# Patient Record
Sex: Female | Born: 1950 | Race: White | Hispanic: No | Marital: Married | Smoking: Former smoker
Health system: Southern US, Community
[De-identification: ages and names within clinical notes are randomized; demographics above are authoritative.]

## PROBLEM LIST (undated history)

## (undated) DIAGNOSIS — Z8489 Family history of other specified conditions: Secondary | ICD-10-CM

## (undated) DIAGNOSIS — M810 Age-related osteoporosis without current pathological fracture: Secondary | ICD-10-CM

## (undated) DIAGNOSIS — I10 Essential (primary) hypertension: Secondary | ICD-10-CM

## (undated) DIAGNOSIS — I8392 Asymptomatic varicose veins of left lower extremity: Secondary | ICD-10-CM

## (undated) DIAGNOSIS — R0600 Dyspnea, unspecified: Secondary | ICD-10-CM

## (undated) DIAGNOSIS — B029 Zoster without complications: Secondary | ICD-10-CM

## (undated) DIAGNOSIS — I499 Cardiac arrhythmia, unspecified: Secondary | ICD-10-CM

## (undated) DIAGNOSIS — M1990 Unspecified osteoarthritis, unspecified site: Secondary | ICD-10-CM

## (undated) DIAGNOSIS — I493 Ventricular premature depolarization: Secondary | ICD-10-CM

## (undated) DIAGNOSIS — M419 Scoliosis, unspecified: Secondary | ICD-10-CM

## (undated) DIAGNOSIS — R112 Nausea with vomiting, unspecified: Secondary | ICD-10-CM

## (undated) DIAGNOSIS — M5126 Other intervertebral disc displacement, lumbar region: Secondary | ICD-10-CM

## (undated) DIAGNOSIS — F419 Anxiety disorder, unspecified: Secondary | ICD-10-CM

## (undated) DIAGNOSIS — I8390 Asymptomatic varicose veins of unspecified lower extremity: Secondary | ICD-10-CM

## (undated) DIAGNOSIS — E785 Hyperlipidemia, unspecified: Secondary | ICD-10-CM

## (undated) DIAGNOSIS — J189 Pneumonia, unspecified organism: Secondary | ICD-10-CM

## (undated) DIAGNOSIS — Z9889 Other specified postprocedural states: Secondary | ICD-10-CM

## (undated) DIAGNOSIS — R51 Headache: Secondary | ICD-10-CM

## (undated) HISTORY — PX: CARPAL TUNNEL RELEASE: SHX101

## (undated) HISTORY — DX: Asymptomatic varicose veins of unspecified lower extremity: I83.90

## (undated) HISTORY — DX: Other intervertebral disc displacement, lumbar region: M51.26

## (undated) HISTORY — DX: Scoliosis, unspecified: M41.9

## (undated) HISTORY — DX: Hyperlipidemia, unspecified: E78.5

## (undated) HISTORY — DX: Age-related osteoporosis without current pathological fracture: M81.0

## (undated) HISTORY — DX: Zoster without complications: B02.9

---

## 1983-10-30 HISTORY — PX: SALPINGOOPHORECTOMY: SHX82

## 1990-10-29 HISTORY — PX: CERVICAL DISC SURGERY: SHX588

## 2001-02-03 ENCOUNTER — Ambulatory Visit (HOSPITAL_COMMUNITY): Admission: RE | Admit: 2001-02-03 | Discharge: 2001-02-03 | Payer: Self-pay | Admitting: Obstetrics and Gynecology

## 2001-02-03 ENCOUNTER — Encounter: Payer: Self-pay | Admitting: Obstetrics and Gynecology

## 2002-01-28 ENCOUNTER — Encounter: Payer: Self-pay | Admitting: *Deleted

## 2002-01-28 ENCOUNTER — Ambulatory Visit (HOSPITAL_COMMUNITY): Admission: RE | Admit: 2002-01-28 | Discharge: 2002-01-28 | Payer: Self-pay | Admitting: *Deleted

## 2002-01-29 ENCOUNTER — Encounter: Payer: Self-pay | Admitting: *Deleted

## 2002-01-29 ENCOUNTER — Ambulatory Visit (HOSPITAL_COMMUNITY): Admission: RE | Admit: 2002-01-29 | Discharge: 2002-01-29 | Payer: Self-pay | Admitting: *Deleted

## 2002-03-09 ENCOUNTER — Encounter: Payer: Self-pay | Admitting: Obstetrics and Gynecology

## 2002-03-09 ENCOUNTER — Ambulatory Visit (HOSPITAL_COMMUNITY): Admission: RE | Admit: 2002-03-09 | Discharge: 2002-03-09 | Payer: Self-pay | Admitting: Obstetrics and Gynecology

## 2002-04-23 ENCOUNTER — Other Ambulatory Visit: Admission: RE | Admit: 2002-04-23 | Discharge: 2002-04-23 | Payer: Self-pay | Admitting: *Deleted

## 2002-07-09 ENCOUNTER — Emergency Department (HOSPITAL_COMMUNITY): Admission: EM | Admit: 2002-07-09 | Discharge: 2002-07-09 | Payer: Self-pay | Admitting: Emergency Medicine

## 2002-08-26 ENCOUNTER — Ambulatory Visit (HOSPITAL_COMMUNITY): Admission: RE | Admit: 2002-08-26 | Discharge: 2002-08-26 | Payer: Self-pay | Admitting: Otolaryngology

## 2002-08-26 ENCOUNTER — Encounter: Payer: Self-pay | Admitting: Otolaryngology

## 2003-03-11 ENCOUNTER — Ambulatory Visit (HOSPITAL_COMMUNITY): Admission: RE | Admit: 2003-03-11 | Discharge: 2003-03-11 | Payer: Self-pay | Admitting: Pediatrics

## 2003-03-11 ENCOUNTER — Encounter: Payer: Self-pay | Admitting: Pediatrics

## 2003-05-14 ENCOUNTER — Other Ambulatory Visit: Admission: RE | Admit: 2003-05-14 | Discharge: 2003-05-14 | Payer: Self-pay | Admitting: *Deleted

## 2003-12-08 ENCOUNTER — Ambulatory Visit (HOSPITAL_COMMUNITY): Admission: RE | Admit: 2003-12-08 | Discharge: 2003-12-08 | Payer: Self-pay | Admitting: Neurological Surgery

## 2004-03-17 ENCOUNTER — Ambulatory Visit (HOSPITAL_COMMUNITY): Admission: RE | Admit: 2004-03-17 | Discharge: 2004-03-17 | Payer: Self-pay | Admitting: Pediatrics

## 2004-05-24 ENCOUNTER — Ambulatory Visit (HOSPITAL_COMMUNITY): Admission: RE | Admit: 2004-05-24 | Discharge: 2004-05-24 | Payer: Self-pay | Admitting: *Deleted

## 2004-08-25 ENCOUNTER — Encounter (INDEPENDENT_AMBULATORY_CARE_PROVIDER_SITE_OTHER): Payer: Self-pay | Admitting: *Deleted

## 2004-08-25 ENCOUNTER — Ambulatory Visit (HOSPITAL_COMMUNITY): Admission: RE | Admit: 2004-08-25 | Discharge: 2004-08-25 | Payer: Self-pay | Admitting: *Deleted

## 2005-03-29 ENCOUNTER — Ambulatory Visit (HOSPITAL_COMMUNITY): Admission: RE | Admit: 2005-03-29 | Discharge: 2005-03-29 | Payer: Self-pay | Admitting: *Deleted

## 2005-07-20 ENCOUNTER — Ambulatory Visit (HOSPITAL_COMMUNITY): Admission: RE | Admit: 2005-07-20 | Discharge: 2005-07-20 | Payer: Self-pay | Admitting: Internal Medicine

## 2005-07-20 ENCOUNTER — Ambulatory Visit: Payer: Self-pay | Admitting: Internal Medicine

## 2005-10-04 ENCOUNTER — Ambulatory Visit (HOSPITAL_COMMUNITY): Admission: RE | Admit: 2005-10-04 | Discharge: 2005-10-04 | Payer: Self-pay | Admitting: Family Medicine

## 2006-04-01 ENCOUNTER — Ambulatory Visit (HOSPITAL_COMMUNITY): Admission: RE | Admit: 2006-04-01 | Discharge: 2006-04-01 | Payer: Self-pay | Admitting: *Deleted

## 2007-01-28 ENCOUNTER — Ambulatory Visit: Payer: Self-pay | Admitting: Vascular Surgery

## 2007-04-07 ENCOUNTER — Ambulatory Visit (HOSPITAL_COMMUNITY): Admission: RE | Admit: 2007-04-07 | Discharge: 2007-04-07 | Payer: Self-pay | Admitting: *Deleted

## 2007-05-06 ENCOUNTER — Ambulatory Visit: Payer: Self-pay | Admitting: Vascular Surgery

## 2007-05-19 ENCOUNTER — Ambulatory Visit: Payer: Self-pay | Admitting: Vascular Surgery

## 2007-05-27 ENCOUNTER — Ambulatory Visit: Payer: Self-pay | Admitting: Vascular Surgery

## 2007-06-16 ENCOUNTER — Ambulatory Visit: Payer: Self-pay | Admitting: Vascular Surgery

## 2007-06-23 ENCOUNTER — Ambulatory Visit: Payer: Self-pay | Admitting: Vascular Surgery

## 2007-10-31 ENCOUNTER — Emergency Department (HOSPITAL_COMMUNITY): Admission: EM | Admit: 2007-10-31 | Discharge: 2007-10-31 | Payer: Self-pay | Admitting: Emergency Medicine

## 2008-04-07 ENCOUNTER — Ambulatory Visit (HOSPITAL_COMMUNITY): Admission: RE | Admit: 2008-04-07 | Discharge: 2008-04-07 | Payer: Self-pay | Admitting: *Deleted

## 2009-04-13 ENCOUNTER — Ambulatory Visit (HOSPITAL_COMMUNITY): Admission: RE | Admit: 2009-04-13 | Discharge: 2009-04-13 | Payer: Self-pay

## 2010-04-26 ENCOUNTER — Ambulatory Visit (HOSPITAL_COMMUNITY): Admission: RE | Admit: 2010-04-26 | Discharge: 2010-04-26 | Payer: Self-pay | Admitting: Obstetrics and Gynecology

## 2010-09-28 ENCOUNTER — Ambulatory Visit (HOSPITAL_COMMUNITY): Admission: RE | Admit: 2010-09-28 | Discharge: 2010-09-28 | Payer: Self-pay | Admitting: Family Medicine

## 2010-09-28 ENCOUNTER — Encounter (HOSPITAL_COMMUNITY)
Admission: RE | Admit: 2010-09-28 | Discharge: 2010-10-28 | Payer: Self-pay | Source: Home / Self Care | Attending: Family Medicine | Admitting: Family Medicine

## 2010-10-04 ENCOUNTER — Ambulatory Visit (HOSPITAL_COMMUNITY)
Admission: RE | Admit: 2010-10-04 | Discharge: 2010-10-05 | Payer: Self-pay | Source: Home / Self Care | Attending: Family Medicine | Admitting: Family Medicine

## 2010-10-11 ENCOUNTER — Encounter (HOSPITAL_BASED_OUTPATIENT_CLINIC_OR_DEPARTMENT_OTHER)
Admission: RE | Admit: 2010-10-11 | Discharge: 2010-11-28 | Payer: Self-pay | Source: Home / Self Care | Attending: General Surgery | Admitting: General Surgery

## 2010-10-11 ENCOUNTER — Encounter: Payer: Self-pay | Admitting: Cardiology

## 2010-11-19 ENCOUNTER — Encounter: Payer: Self-pay | Admitting: Obstetrics and Gynecology

## 2010-11-30 NOTE — Procedures (Signed)
Summary: Holter and Event  Holter and Event   Imported By: Faythe Ghee 10/11/2010 15:43:02  _____________________________________________________________________  External Attachment:    Type:   Image     Comment:   External Document

## 2010-12-15 ENCOUNTER — Encounter (HOSPITAL_BASED_OUTPATIENT_CLINIC_OR_DEPARTMENT_OTHER): Payer: 59 | Attending: General Surgery

## 2010-12-15 DIAGNOSIS — T8189XA Other complications of procedures, not elsewhere classified, initial encounter: Secondary | ICD-10-CM | POA: Insufficient documentation

## 2010-12-15 DIAGNOSIS — Y838 Other surgical procedures as the cause of abnormal reaction of the patient, or of later complication, without mention of misadventure at the time of the procedure: Secondary | ICD-10-CM | POA: Insufficient documentation

## 2010-12-15 DIAGNOSIS — I872 Venous insufficiency (chronic) (peripheral): Secondary | ICD-10-CM | POA: Insufficient documentation

## 2011-01-12 ENCOUNTER — Encounter (HOSPITAL_BASED_OUTPATIENT_CLINIC_OR_DEPARTMENT_OTHER): Payer: 59 | Attending: General Surgery

## 2011-01-12 DIAGNOSIS — Y838 Other surgical procedures as the cause of abnormal reaction of the patient, or of later complication, without mention of misadventure at the time of the procedure: Secondary | ICD-10-CM | POA: Insufficient documentation

## 2011-01-12 DIAGNOSIS — T8189XA Other complications of procedures, not elsewhere classified, initial encounter: Secondary | ICD-10-CM | POA: Insufficient documentation

## 2011-01-12 DIAGNOSIS — I872 Venous insufficiency (chronic) (peripheral): Secondary | ICD-10-CM | POA: Insufficient documentation

## 2011-03-13 NOTE — Assessment & Plan Note (Signed)
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  She has been wearing elastic compression stockings  bilaterally and trying analgesics such as ibuprofen and elevation of the  legs as much as her job will permit and has had no improvement in her  symptoms.  She works as a Animal nutritionist at [employer] and sits with her legs  in the dependent position during the day and this is definitely  affecting her job and her daily living.  She describes severe throbbing,  aching, burning and itching in both lower extremities, with the right  being worse than the left.  This is in the distal thigh and calf areas.  She has had no bleeding or ulcerations.   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  We will proceed with pre-certification and  schedule this procedure in the near future.   Quita Skye Hart [place], M.D.  Electronically Signed   JDL/MEDQ  D:  05/06/2007  T:  05/07/2007  Job:  [id number]

## 2011-03-13 NOTE — Assessment & Plan Note (Signed)
OFFICE VISIT   RYLEA, SELWAY  DOB:  [birth year]                                       06/23/2007  [id number]#:[id number]   Ms. O'Dell returns status post laser ablation of her left greater  saphenous vein with multiple stab phlebectomies (greater than 20) in her  left leg done on August 18 for painful varicosities.  She has had an  excellent early result and some mild tenderness along the course of the  greater saphenous vein in the thigh but no significant bruising.  All of  the stab phlebectomy wounds are healing nicely with no evidence of  infection.  There is no distal edema.  I performed a venous duplex exam  today and the deep system is widely patent with no evidence of deep  venous thrombosis.  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  She was reassured regarding these findings.  She will continue to wear  elastic compression stockings for the next two weeks as well as take her  Ibuprofen as prescribed and return to see me in three moths for final  follow-up.   Quita Skye Hart [place], M.D.  Electronically Signed   JDL/MEDQ  D:  06/23/2007  T:  06/24/2007  Job:  [id number]

## 2011-03-13 NOTE — Assessment & Plan Note (Signed)
OFFICE VISIT   Jennifer Coleman, Jennifer Coleman  DOB:  03/21/1951                                       05/27/2007  KVQQV#:[id number]   [name] is eight days post laser ablation of her right greater  saphenous vein with multiple stab phlebectomy  to the right leg.  She  has had moderate amount of discomfort in the right thigh where the  saphenous vein was ablated which has been improving on a daily basis.  She has had some aching discomfort over the stab phlebectomy in the  pretibial region, but, otherwise, no complaints.  She is wearing her  last compression stocking and has taken ibuprofen.  She has had no  distal edema.  Venous Duplex exam was performed by me which reveals  occlusion of the greater saphenous vein throughout the thigh from the  saphenofemoral junction to the knee and widely patent deep venous system  with no evidence of any deep venous thrombosis or flow abnormalities.  She is reassured regarding her findings.  She will return for laser  ablation of the left greater saphenous vein with stab phlebectomy on the  18th of August.   Quita Skye. Hart [place], M.D.  Electronically Signed   JDL/MEDQ  D:  05/27/2007  T:  05/28/2007  Job:  [id number]

## 2011-03-16 NOTE — Op Note (Signed)
NAMELINDSAY, STRAKA                  ACCOUNT NO.:  [id number]   MEDICAL RECORD NO.:  [id number]          PATIENT TYPE:  AMB   LOCATION:  SDC                           FACILITY:  [facility]   PHYSICIAN:  Spencer B. Earlene Plater, M.D.  DATE OF BIRTH:  03/06/1951   DATE OF PROCEDURE:  08/25/2004  DATE OF DISCHARGE:                                 OPERATIVE REPORT   PREOPERATIVE DIAGNOSES:  Endometrial mass.   POSTOPERATIVE DIAGNOSES:  Endometrial mass.   PROCEDURE:  Hysteroscopy with removal of intrauterine mass and endometrial  curettage.   SURGEON:  Chester Holstein. Earlene Plater, M.D.   ANESTHESIA:  LMA and 20 mL 1% Nesacaine paracervical block.   FINDINGS:  Calcified 1.5 cm endometrial mass at the fundus.   FLUID DEFICIT:  50 mL of sorbitol.   SPECIMENS:  Intrauterine mass.   COMPLICATIONS:  None.   INDICATIONS FOR PROCEDURE:  Patient with a history of lower abdominal pain  at a recent annual exam. Pelvic exam was normal, however, I ordered an  ultrasound to rule out ovarian pathology. The ovaries appeared normal.  However, an incidental note was made of a calcified 1.5 cm endometrial mass  and uterine fundus. Repeat ultrasound a month later showed persistence. To  determine the etiology, the patient presents for hysteroscopy. Operative  risks discussed including infection, bleeding, uterine perforation, damage  to surrounding organs and fluid overload.   The patient taken to the operating room and LMA anesthesia obtained. She was  prepped and draped in standard fashion and bladder emptied with red rubber  catheter. Exam  under anesthesia showed an anteverted normal uterus, no  adnexal masses.   Speculum inserted, paracervical block placed, anterior lip of the cervix  grasped with a tenaculum. The cervix dilated to a #21 without difficulty and  the diagnostic hysteroscope inserted after being flushed with sorbitol. With  good uterine distention, the above findings were noted. I attempted to  remove  the mass with Randall stone forceps but it was very adherent to the  fundus.   I did also try to gently curette the area with no success.   The cervix was then dilated to a #31 and the resectoscope inserted. A double  loop resection tool was used to attempt to resect the mass.  However, I  could not get a good angle on the base of the mass to safely resect it. I  therefore switched over to the working hysteroscope and inserted a grasping  biopsy forceps which successfully removed the mass. It was friable but  easily removed and submitted to pathology.  The cavity was reinspected, no  other abnormalities noted.  Therefore the procedure was terminated.   The patient tolerated the procedure well, there were no complications.  She  was taken to the recovery room awake, alert in stable condition.     Wesl   WBD/MEDQ  D:  08/25/2004  T:  08/25/2004  Job:  [id number]

## 2011-03-16 NOTE — Op Note (Signed)
[name], [name]                  ACCOUNT NO.:  [id number]   MEDICAL RECORD NO.:  [id number]          PATIENT TYPE:  AMB   LOCATION:  DAY                           FACILITY:  [facility]   PHYSICIAN:  Lionel December, M.D.    DATE OF BIRTH:  26-Jul-1951   DATE OF PROCEDURE:  07/20/2005  DATE OF DISCHARGE:                                 OPERATIVE REPORT   PROCEDURE:  Colonoscopy.   INDICATION:  Jennifer Coleman is a 60 year old Caucasian female who is here for  screening colonoscopy. Procedure risks were reviewed with the patient, and  informed consent was obtained. Her family history is negative for colorectal  carcinoma.   PREMEDICATION:  Demerol 50 mg IV, Versed 6 mg IV in divided dose.   FINDINGS:  Procedure performed in endoscopy suite. The patient's vital signs  and O2 saturation were monitored during procedure and remained stable. The  patient was placed in left lateral position and rectal examination  performed. No abnormality noted on external or digital exam. Olympus  videoscope was placed in rectum and advanced under vision into sigmoid colon  and beyond. Preparation was excellent. Few scattered diverticula were noted  at sigmoid colon, few tiny ones at transverse colon. Scope was passed to  cecum which was identified by ileocecal valve and appendiceal orifice.  Pictures taken for the record. As the scope was withdrawn, colonic mucosa  was once again carefully examined. There were no polyps or masses noted.  Rectal mucosa was normal. Scope was retroflexed to examine anorectal  junction, and small hemorrhoids were noted below the dentate line. Endoscope  was straightened and withdrawn. The patient tolerated the procedure well.   FINAL DIAGNOSIS:  1.  Sigmoid colon diverticulosis with a few more diverticula in transverse      colon. Most of the diverticula are small.  2.  Small external hemorrhoids.   RECOMMENDATIONS:  1.  High-fiber diet.  2.  Yearly Hemoccults.  3.  She will not need  another screening for 10 years.      Lionel December, M.D.  Electronically Signed     NR/MEDQ  D:  07/20/2005  T:  07/20/2005  Job:  [id number]   cc:   Wyvonnia Lora  Fax: [phone]

## 2011-04-23 ENCOUNTER — Other Ambulatory Visit (HOSPITAL_COMMUNITY): Payer: Self-pay | Admitting: Obstetrics and Gynecology

## 2011-04-23 DIAGNOSIS — Z139 Encounter for screening, unspecified: Secondary | ICD-10-CM

## 2011-04-30 ENCOUNTER — Ambulatory Visit (HOSPITAL_COMMUNITY)
Admission: RE | Admit: 2011-04-30 | Discharge: 2011-04-30 | Disposition: A | Discharge status: Home / Self Care | Payer: 59 | Source: Ambulatory Visit | Attending: Obstetrics and Gynecology | Admitting: Obstetrics and Gynecology

## 2011-04-30 DIAGNOSIS — Z139 Encounter for screening, unspecified: Secondary | ICD-10-CM

## 2011-04-30 DIAGNOSIS — Z1231 Encounter for screening mammogram for malignant neoplasm of breast: Secondary | ICD-10-CM | POA: Insufficient documentation

## 2011-09-17 NOTE — H&P (Signed)
  NAME:  CHARLYE, SPARE NO.:  [id number]  MEDICAL RECORD NO.:  [id number]          PATIENT TYPE:  REC  LOCATION:  FOOT                         FACILITY:  [facility]  PHYSICIAN:  Ardath Sax, M.D.     DATE OF BIRTH:  Aug 27, 1951  DATE OF ADMISSION: DATE OF DISCHARGE:                             HISTORY & PHYSICAL   She is a new patient who comes to see [place] because of a lingering wound on the anterior aspect of her left leg.  Following biopsy done by a dermatologist which proved to be benign.  Apparently, this was a lot larger at one time up to 2.5 cm, now it is only about a centimeter in diameter.  It cultured out of enterococcus and she is on Biaxin now. Otherwise, she is very healthy.  She is 55, never had any surgery, takes no medicine at all.  She is a healthy active person with no heart disease, lung disease, hypertension, kidney disease, and no previous surgery.  She says that when the doctor biopsied this, he used silver nitrate to stop bleeding.  It is now actually very clean with good granulation tissue.  I think it will lend itself very nicely to Puracol, so we put dressing on and she will use this every day and come back in a week and we will check her.  I think this should heal up in 6-80 weeks. Her blood supply is very good.  She has got good pulses and no evidence of any significant venous disease, although she is wearing bilateral support hose.  She will return in a week.     Ardath Sax, M.D.     PP/MEDQ  D:  10/11/2010  T:  10/12/2010  Job:  [id number]  Electronically Signed by Ardath Sax  on 09/17/2011 01:47:36 PM

## 2011-10-18 ENCOUNTER — Other Ambulatory Visit (HOSPITAL_COMMUNITY): Payer: Self-pay | Admitting: Family Medicine

## 2011-10-18 ENCOUNTER — Ambulatory Visit (HOSPITAL_COMMUNITY): Payer: 59

## 2011-11-14 ENCOUNTER — Other Ambulatory Visit: Payer: Self-pay | Admitting: Physician Assistant

## 2011-11-14 DIAGNOSIS — C4492 Squamous cell carcinoma of skin, unspecified: Secondary | ICD-10-CM

## 2011-11-14 HISTORY — DX: Squamous cell carcinoma of skin, unspecified: C44.92

## 2011-11-28 ENCOUNTER — Other Ambulatory Visit (HOSPITAL_COMMUNITY): Payer: Self-pay | Admitting: Orthopaedic Surgery

## 2011-11-28 DIAGNOSIS — M25561 Pain in right knee: Secondary | ICD-10-CM

## 2011-11-30 ENCOUNTER — Ambulatory Visit (HOSPITAL_COMMUNITY)
Admission: RE | Admit: 2011-11-30 | Discharge: 2011-11-30 | Disposition: A | Discharge status: Home / Self Care | Payer: 59 | Source: Ambulatory Visit | Attending: Orthopaedic Surgery | Admitting: Orthopaedic Surgery

## 2011-11-30 DIAGNOSIS — M25561 Pain in right knee: Secondary | ICD-10-CM

## 2011-11-30 DIAGNOSIS — Y9289 Other specified places as the place of occurrence of the external cause: Secondary | ICD-10-CM | POA: Insufficient documentation

## 2011-11-30 DIAGNOSIS — M25569 Pain in unspecified knee: Secondary | ICD-10-CM | POA: Insufficient documentation

## 2011-11-30 DIAGNOSIS — S99929A Unspecified injury of unspecified foot, initial encounter: Secondary | ICD-10-CM | POA: Insufficient documentation

## 2011-11-30 DIAGNOSIS — R937 Abnormal findings on diagnostic imaging of other parts of musculoskeletal system: Secondary | ICD-10-CM | POA: Insufficient documentation

## 2011-11-30 DIAGNOSIS — S99919A Unspecified injury of unspecified ankle, initial encounter: Secondary | ICD-10-CM | POA: Insufficient documentation

## 2011-11-30 DIAGNOSIS — S8990XA Unspecified injury of unspecified lower leg, initial encounter: Secondary | ICD-10-CM | POA: Insufficient documentation

## 2011-11-30 DIAGNOSIS — X500XXA Overexertion from strenuous movement or load, initial encounter: Secondary | ICD-10-CM | POA: Insufficient documentation

## 2012-05-19 ENCOUNTER — Ambulatory Visit (HOSPITAL_COMMUNITY)
Admission: RE | Admit: 2012-05-19 | Discharge: 2012-05-19 | Disposition: A | Discharge status: Home / Self Care | Payer: 59 | Source: Ambulatory Visit | Attending: Family Medicine | Admitting: Family Medicine

## 2012-05-19 ENCOUNTER — Other Ambulatory Visit: Payer: Self-pay | Admitting: Family Medicine

## 2012-05-19 DIAGNOSIS — M79609 Pain in unspecified limb: Secondary | ICD-10-CM | POA: Insufficient documentation

## 2012-05-19 DIAGNOSIS — M7989 Other specified soft tissue disorders: Secondary | ICD-10-CM | POA: Insufficient documentation

## 2012-05-19 DIAGNOSIS — R609 Edema, unspecified: Secondary | ICD-10-CM

## 2012-05-20 ENCOUNTER — Other Ambulatory Visit: Payer: Self-pay | Admitting: Family Medicine

## 2012-05-20 ENCOUNTER — Other Ambulatory Visit (HOSPITAL_COMMUNITY): Payer: Self-pay | Admitting: Obstetrics and Gynecology

## 2012-05-20 DIAGNOSIS — R7989 Other specified abnormal findings of blood chemistry: Secondary | ICD-10-CM

## 2012-05-20 DIAGNOSIS — Z139 Encounter for screening, unspecified: Secondary | ICD-10-CM

## 2012-05-27 ENCOUNTER — Other Ambulatory Visit (HOSPITAL_COMMUNITY): Payer: 59

## 2012-05-27 ENCOUNTER — Ambulatory Visit (HOSPITAL_COMMUNITY): Payer: 59

## 2012-05-27 ENCOUNTER — Ambulatory Visit (HOSPITAL_COMMUNITY)
Admission: RE | Admit: 2012-05-27 | Discharge: 2012-05-27 | Disposition: A | Discharge status: Home / Self Care | Payer: 59 | Source: Ambulatory Visit | Attending: Obstetrics and Gynecology | Admitting: Obstetrics and Gynecology

## 2012-05-27 DIAGNOSIS — Z139 Encounter for screening, unspecified: Secondary | ICD-10-CM

## 2012-05-27 DIAGNOSIS — Z1231 Encounter for screening mammogram for malignant neoplasm of breast: Secondary | ICD-10-CM | POA: Insufficient documentation

## 2012-05-28 ENCOUNTER — Other Ambulatory Visit (HOSPITAL_COMMUNITY): Payer: 59

## 2012-07-07 ENCOUNTER — Telehealth: Payer: Self-pay | Admitting: Vascular Surgery

## 2012-07-07 NOTE — Telephone Encounter (Signed)
Patient called and left a message requesting to schedule an appointment for a wound on her leg. She is a former [facility]V pt of [facility]. We haven't seen her in several years and would require a referral since we would be seeing her for a different problem than VV. I left a voice mail message on her home ph# at 2:43pm today in regards to the above inquiry. awt

## 2012-08-06 ENCOUNTER — Other Ambulatory Visit (HOSPITAL_COMMUNITY): Payer: Self-pay | Admitting: Orthopaedic Surgery

## 2012-08-06 DIAGNOSIS — M25569 Pain in unspecified knee: Secondary | ICD-10-CM

## 2012-08-07 ENCOUNTER — Ambulatory Visit (HOSPITAL_COMMUNITY)
Admission: RE | Admit: 2012-08-07 | Discharge: 2012-08-07 | Disposition: A | Discharge status: Home / Self Care | Payer: 59 | Source: Ambulatory Visit | Attending: Orthopaedic Surgery | Admitting: Orthopaedic Surgery

## 2012-08-07 ENCOUNTER — Ambulatory Visit (HOSPITAL_COMMUNITY): Payer: 59

## 2012-08-07 DIAGNOSIS — M76899 Other specified enthesopathies of unspecified lower limb, excluding foot: Secondary | ICD-10-CM | POA: Insufficient documentation

## 2012-08-07 DIAGNOSIS — M25569 Pain in unspecified knee: Secondary | ICD-10-CM | POA: Insufficient documentation

## 2012-08-07 DIAGNOSIS — M25469 Effusion, unspecified knee: Secondary | ICD-10-CM | POA: Insufficient documentation

## 2012-08-07 DIAGNOSIS — X500XXA Overexertion from strenuous movement or load, initial encounter: Secondary | ICD-10-CM | POA: Insufficient documentation

## 2012-08-07 DIAGNOSIS — IMO0002 Reserved for concepts with insufficient information to code with codable children: Secondary | ICD-10-CM | POA: Insufficient documentation

## 2012-08-08 ENCOUNTER — Ambulatory Visit (HOSPITAL_COMMUNITY): Payer: 59

## 2012-08-26 ENCOUNTER — Other Ambulatory Visit (HOSPITAL_COMMUNITY): Payer: Self-pay | Admitting: Obstetrics and Gynecology

## 2012-08-26 DIAGNOSIS — Z79890 Hormone replacement therapy (postmenopausal): Secondary | ICD-10-CM

## 2012-08-26 DIAGNOSIS — Z780 Asymptomatic menopausal state: Secondary | ICD-10-CM

## 2012-09-11 ENCOUNTER — Encounter (HOSPITAL_COMMUNITY): Payer: Self-pay

## 2012-09-12 NOTE — Pre-Procedure Instructions (Signed)
20 BRYONNA SUNDBY  09/12/2012   Your procedure is scheduled on:  Tuesday November 19  Report to [facility] at 5:30 AM.  Call this number if you have problems the morning of surgery: [phone]   Remember:   Do not eat or drink:After Midnight.    Take these medicines the morning of surgery with A SIP OF WATER: Cetirizine. May use Flonase nasal spray.    Do not wear jewelry, make-up or nail polish.  Do not wear lotions, powders, or perfumes. You may wear deodorant.  Do not shave 48 hours prior to surgery. Men may shave face and neck.  Do not bring valuables to the hospital.  Contacts, dentures or bridgework may not be worn into surgery.  Leave suitcase in the car. After surgery it may be brought to your room.  For patients admitted to the hospital, checkout time is 11:00 AM the day of discharge.   Patients discharged the day of surgery will not be allowed to drive home.    Special Instructions: Shower using CHG 2 nights before surgery and the night before surgery.  If you shower the day of surgery use CHG.  Use special wash - you have one bottle of CHG for all showers.  You should use approximately 1/3 of the bottle for each shower.   Please read over the following fact sheets that you were given: Pain Booklet, Coughing and Deep Breathing and Surgical Site Infection Prevention

## 2012-09-15 ENCOUNTER — Encounter (HOSPITAL_COMMUNITY)
Admission: RE | Admit: 2012-09-15 | Discharge: 2012-09-15 | Disposition: A | Discharge status: Home / Self Care | Payer: 59 | Source: Ambulatory Visit | Attending: Orthopaedic Surgery | Admitting: Orthopaedic Surgery

## 2012-09-15 ENCOUNTER — Encounter (HOSPITAL_COMMUNITY): Payer: Self-pay

## 2012-09-15 HISTORY — DX: Other specified postprocedural states: R11.2

## 2012-09-15 HISTORY — DX: Family history of other specified conditions: Z84.89

## 2012-09-15 HISTORY — DX: Cardiac arrhythmia, unspecified: I49.9

## 2012-09-15 HISTORY — DX: Other specified postprocedural states: Z98.890

## 2012-09-15 LAB — CBC
Hemoglobin: 14.1 g/dL (ref 12.0–15.0)
MCH: 32.7 pg (ref 26.0–34.0)
MCV: 97 fL (ref 78.0–100.0)
Platelets: 265 10*3/uL (ref 150–400)
RBC: 4.31 MIL/uL (ref 3.87–5.11)
RDW: 12.5 % (ref 11.5–15.5)

## 2012-09-15 NOTE — Pre-Procedure Instructions (Signed)
20 Jennifer Coleman  09/15/2012   Your procedure is scheduled on: Wednesday  September 24, 2012  Report to [facility] at 0600 AM.  Call this number if you have problems the morning of surgery: [phone]   Remember:   Do not eat food  Do not drink liquids:After Midnight.    Take these medicines the morning of surgery with A SIP OF WATER: Flonase  Activella   Do not wear jewelry, make-up or nail polish.  Do not wear lotions, powders, or perfumes. You may wear deodorant.  Do not shave 48 hours prior to surgery. Men may shave face and neck.  Do not bring valuables to the hospital.  Contacts, dentures or bridgework may not be worn into surgery.  Leave suitcase in the car. After surgery it may be brought to your room.  For patients admitted to the hospital, checkout time is 11:00 AM the day of discharge.   Patients discharged the day of surgery will not be allowed to drive home.  Name and phone number of your driver:   Special Instructions: Shower using CHG 2 nights before surgery and the night before surgery.  If you shower the day of surgery use CHG.  Use special wash - you have one bottle of CHG for all showers.  You should use approximately 1/3 of the bottle for each shower. N/A   Please read over the following fact sheets that you were given: Pain Booklet, Coughing and Deep Breathing, Lab Information, MRSA Information and Surgical Site Infection Prevention

## 2012-09-15 NOTE — Progress Notes (Signed)
Called office of Dr Cleophas Dunker [phone] requested orders ... At 1000 today .... Orders placed by pa at 1104am .

## 2012-09-16 ENCOUNTER — Ambulatory Visit (HOSPITAL_BASED_OUTPATIENT_CLINIC_OR_DEPARTMENT_OTHER): Admit: 2012-09-16 | Payer: 59 | Admitting: Orthopaedic Surgery

## 2012-09-16 ENCOUNTER — Ambulatory Visit (HOSPITAL_COMMUNITY)
Admission: RE | Admit: 2012-09-16 | Discharge: 2012-09-16 | Disposition: A | Discharge status: Home / Self Care | Payer: 59 | Source: Ambulatory Visit | Attending: Orthopaedic Surgery | Admitting: Orthopaedic Surgery

## 2012-09-16 ENCOUNTER — Encounter (HOSPITAL_BASED_OUTPATIENT_CLINIC_OR_DEPARTMENT_OTHER): Payer: Self-pay

## 2012-09-16 ENCOUNTER — Encounter (HOSPITAL_COMMUNITY)
Admission: RE | Disposition: A | Discharge status: Home / Self Care | Payer: Self-pay | Source: Ambulatory Visit | Attending: Orthopaedic Surgery

## 2012-09-16 ENCOUNTER — Ambulatory Visit (HOSPITAL_COMMUNITY): Payer: 59 | Admitting: Anesthesiology

## 2012-09-16 ENCOUNTER — Encounter (HOSPITAL_COMMUNITY): Payer: Self-pay | Admitting: Anesthesiology

## 2012-09-16 ENCOUNTER — Encounter (HOSPITAL_COMMUNITY): Payer: Self-pay | Admitting: *Deleted

## 2012-09-16 DIAGNOSIS — M6751 Plica syndrome, right knee: Secondary | ICD-10-CM | POA: Diagnosis present

## 2012-09-16 DIAGNOSIS — S83289A Other tear of lateral meniscus, current injury, unspecified knee, initial encounter: Secondary | ICD-10-CM | POA: Diagnosis present

## 2012-09-16 DIAGNOSIS — M1710 Unilateral primary osteoarthritis, unspecified knee: Secondary | ICD-10-CM | POA: Insufficient documentation

## 2012-09-16 DIAGNOSIS — K219 Gastro-esophageal reflux disease without esophagitis: Secondary | ICD-10-CM | POA: Insufficient documentation

## 2012-09-16 DIAGNOSIS — M2241 Chondromalacia patellae, right knee: Secondary | ICD-10-CM | POA: Diagnosis present

## 2012-09-16 DIAGNOSIS — X500XXA Overexertion from strenuous movement or load, initial encounter: Secondary | ICD-10-CM | POA: Insufficient documentation

## 2012-09-16 DIAGNOSIS — M94269 Chondromalacia, unspecified knee: Secondary | ICD-10-CM | POA: Diagnosis present

## 2012-09-16 DIAGNOSIS — M2240 Chondromalacia patellae, unspecified knee: Secondary | ICD-10-CM | POA: Insufficient documentation

## 2012-09-16 HISTORY — PX: KNEE ARTHROSCOPY: SHX127

## 2012-09-16 SURGERY — ARTHROSCOPY, KNEE
Anesthesia: Choice | Site: Knee | Laterality: Right

## 2012-09-16 SURGERY — ARTHROSCOPY, KNEE
Anesthesia: General | Site: Knee | Laterality: Right | Wound class: Clean

## 2012-09-16 MED ORDER — OXYCODONE HCL 5 MG/5ML PO SOLN: 5.0000 mg | Freq: Once | ORAL | Status: DC | PRN

## 2012-09-16 MED ORDER — ONDANSETRON HCL 4 MG/2ML IJ SOLN: 4.0000 mg | Freq: Four times a day (QID) | INTRAMUSCULAR | Status: DC | PRN

## 2012-09-16 MED ORDER — ARTIFICIAL TEARS OP OINT
TOPICAL_OINTMENT | OPHTHALMIC | Status: DC | PRN
  Administered 2012-09-16: 1 via OPHTHALMIC

## 2012-09-16 MED ORDER — HYDROMORPHONE HCL PF 1 MG/ML IJ SOLN
0.2500 mg | INTRAMUSCULAR | Status: DC | PRN
  Administered 2012-09-16 (×2): 0.5 mg via INTRAVENOUS

## 2012-09-16 MED ORDER — HYDROMORPHONE HCL PF 1 MG/ML IJ SOLN
INTRAMUSCULAR | Status: AC
  Filled 2012-09-16: qty 1

## 2012-09-16 MED ORDER — BUPIVACAINE-EPINEPHRINE PF 0.25-1:200000 % IJ SOLN
INTRAMUSCULAR | Status: AC
  Filled 2012-09-16: qty 30

## 2012-09-16 MED ORDER — ONDANSETRON HCL 4 MG/2ML IJ SOLN
INTRAMUSCULAR | Status: DC | PRN
  Administered 2012-09-16: 4 mg via INTRAVENOUS

## 2012-09-16 MED ORDER — OXYCODONE HCL 5 MG PO TABS: 5.0000 mg | ORAL_TABLET | Freq: Once | ORAL | Status: DC | PRN

## 2012-09-16 MED ORDER — ASPIRIN EC 81 MG PO TBEC: 81.0000 mg | DELAYED_RELEASE_TABLET | Freq: Every day | ORAL | Status: DC

## 2012-09-16 MED ORDER — MIDAZOLAM HCL 5 MG/5ML IJ SOLN
INTRAMUSCULAR | Status: DC | PRN
  Administered 2012-09-16 (×2): 1 mg via INTRAVENOUS

## 2012-09-16 MED ORDER — CHLORHEXIDINE GLUCONATE 4 % EX LIQD
60.0000 mL | Freq: Once | CUTANEOUS | Status: DC
Start: 2012-09-16 — End: 2012-09-16

## 2012-09-16 MED ORDER — OXYCODONE-ACETAMINOPHEN 5-325 MG PO TABS: ORAL_TABLET | ORAL | Status: DC

## 2012-09-16 MED ORDER — LACTATED RINGERS IV SOLN
INTRAVENOUS | Status: DC | PRN
  Administered 2012-09-16: 07:00:00 via INTRAVENOUS

## 2012-09-16 MED ORDER — SODIUM CHLORIDE 0.9 % IR SOLN
Status: DC | PRN
  Administered 2012-09-16: 6000 mL

## 2012-09-16 MED ORDER — BUPIVACAINE-EPINEPHRINE 0.25% -1:200000 IJ SOLN
INTRAMUSCULAR | Status: DC | PRN
  Administered 2012-09-16: 10 mL

## 2012-09-16 MED ORDER — LIDOCAINE HCL (CARDIAC) 20 MG/ML IV SOLN
INTRAVENOUS | Status: DC | PRN
  Administered 2012-09-16: 60 mg via INTRAVENOUS

## 2012-09-16 MED ORDER — FENTANYL CITRATE 0.05 MG/ML IJ SOLN
INTRAMUSCULAR | Status: DC | PRN
  Administered 2012-09-16 (×3): 25 ug via INTRAVENOUS
  Administered 2012-09-16: 50 ug via INTRAVENOUS
  Administered 2012-09-16: 25 ug via INTRAVENOUS
  Administered 2012-09-16: 50 ug via INTRAVENOUS

## 2012-09-16 MED ORDER — DEXAMETHASONE SODIUM PHOSPHATE 4 MG/ML IJ SOLN
INTRAMUSCULAR | Status: DC | PRN
  Administered 2012-09-16: 4 mg via INTRAVENOUS

## 2012-09-16 MED ORDER — SODIUM CHLORIDE 0.9 % IV SOLN: INTRAVENOUS | Status: DC

## 2012-09-16 MED ORDER — PROPOFOL 10 MG/ML IV BOLUS
INTRAVENOUS | Status: DC | PRN
  Administered 2012-09-16: 175 mg via INTRAVENOUS
  Administered 2012-09-16: 25 mg via INTRAVENOUS

## 2012-09-16 SURGICAL SUPPLY — 29 items
BANDAGE ELASTIC 6 VELCRO ST LF (GAUZE/BANDAGES/DRESSINGS) ×2 IMPLANT
BLADE CUDA 5.5 (BLADE) IMPLANT
BLADE GREAT WHITE 4.2 (BLADE) ×2 IMPLANT
BUR OVAL 6.0 (BURR) ×1 IMPLANT
CLOTH BEACON ORANGE TIMEOUT ST (SAFETY) ×2 IMPLANT
CUFF TOURNIQUET SINGLE 34IN LL (TOURNIQUET CUFF) IMPLANT
CUFF TOURNIQUET SINGLE 44IN (TOURNIQUET CUFF) IMPLANT
DRAPE ARTHROSCOPY W/POUCH 114 (DRAPES) ×2 IMPLANT
DRSG EMULSION OIL 3X3 NADH (GAUZE/BANDAGES/DRESSINGS) ×2 IMPLANT
DURAPREP 26ML APPLICATOR (WOUND CARE) ×2 IMPLANT
GLOVE BIOGEL PI IND STRL 8 (GLOVE) ×1 IMPLANT
GLOVE BIOGEL PI IND STRL 8.5 (GLOVE) ×1 IMPLANT
GLOVE BIOGEL PI INDICATOR 8 (GLOVE) ×1
GLOVE BIOGEL PI INDICATOR 8.5 (GLOVE) ×1
GLOVE ECLIPSE 8.0 STRL XLNG CF (GLOVE) ×2 IMPLANT
GLOVE SURG ORTHO 8.5 STRL (GLOVE) ×2 IMPLANT
GOWN PREVENTION PLUS XLARGE (GOWN DISPOSABLE) ×2 IMPLANT
GOWN STRL NON-REIN LRG LVL3 (GOWN DISPOSABLE) ×4 IMPLANT
KIT ROOM TURNOVER OR (KITS) ×2 IMPLANT
MANIFOLD NEPTUNE II (INSTRUMENTS) ×2 IMPLANT
PACK ARTHROSCOPY DSU (CUSTOM PROCEDURE TRAY) ×2 IMPLANT
PAD ARMBOARD 7.5X6 YLW CONV (MISCELLANEOUS) ×4 IMPLANT
SET ARTHROSCOPY TUBING (MISCELLANEOUS) ×2
SET ARTHROSCOPY TUBING LN (MISCELLANEOUS) ×1 IMPLANT
SPONGE GAUZE 4X4 12PLY (GAUZE/BANDAGES/DRESSINGS) ×2 IMPLANT
SPONGE LAP 4X18 X RAY DECT (DISPOSABLE) ×2 IMPLANT
TOWEL OR 17X24 6PK STRL BLUE (TOWEL DISPOSABLE) ×4 IMPLANT
WAND 90 DEG TURBOVAC W/CORD (SURGICAL WAND) ×2 IMPLANT
WATER STERILE IRR 1000ML POUR (IV SOLUTION) ×2 IMPLANT

## 2012-09-16 NOTE — Progress Notes (Signed)
Pt feeling some nausea... Resting with cool cloth to forehead.  No vomiting at this time .   Ice pack to  Right knee.

## 2012-09-16 NOTE — Preoperative (Signed)
Beta Blockers   Reason not to administer Beta Blockers: not prescribed 

## 2012-09-16 NOTE — Discharge Summary (Signed)
Home Norlene Campbell, MD   Jacqualine Code, PA-C [address] [place], [place]  [address]                             [phone]  PATIENT ID: Jennifer Coleman        MRN:  [id number]          DOB/AGE: [birth year] / 61 y.o.    DISCHARGE SUMMARY  ADMISSION DATE:    09/16/2012 DISCHARGE DATE:   09/16/2012   ADMISSION DIAGNOSIS: Right lateral meniscal tear   DISCHARGE DIAGNOSIS:  Right Lateral meniscal tear, chondromalacia patella and lateral femoral condyle, plical band    ADDITIONAL DIAGNOSIS: Active Problems:  Chondromalacia of patella, right  Chondromalacia of lateral femoral condyle  Plica syndrome of right knee  Past Medical History  Diagnosis Date  . PONV (postoperative nausea and vomiting)   . Family history of anesthesia complication 25 yrs ago    sister allergic to sccinocholine.. unable to come off vent. /or be extubated post op   . Dysrhythmia     pvc s from caffiene.  and allergy medicines.   Marland Kitchen GERD (gastroesophageal reflux disease)     occasionally  from spicy foods.     PROCEDURE: Procedure(s): ARTHROSCOPY KNEE Righton 09/16/2012  CONSULTS: none     HISTORY: Patient is a 61 y.o. female presented with a history of pain in the right knee several months after a twisting injury. Had an MRI in February revealing a cleft-like defect in the midbody of the lateral meniscus, This has been very painful and would like to have this remedied with surgery.   HOSPITAL COURSE:  Jennifer Coleman is a 61 y.o. admitted on 09/16/2012 and found to have a diagnosis of Right Medial Meniscus Tear.  After appropriate laboratory studies were obtained  they were taken to the operating room on 09/16/2012 and underwent  Procedure(s): ARTHROSCOPY KNEE Right.   They were given perioperative antibiotics:  Anti-infectives    None    .  Tolerated the procedure well.  This was an outpatient procedure  The patient was discharged on Day of Surgery in  Stable condition.  Blood products  given:none  DIAGNOSTIC STUDIES: Recent vital signs: Patient Vitals for the past 24 hrs:  BP Temp Temp src Pulse Resp SpO2  09/16/12 0628 123/82 mmHg 98 F (36.7 C) Oral 60  20  99 %       Recent laboratory studies:  Basename 09/15/12 1100  WBC 7.1  HGB 14.1  HCT 41.8  PLT 265   No results found for this basename: NA:7,K:7,CL:7,CO2:7,BUN:7,CREATININE:7,GLUCOSE:7,CALCIUM:7 in the last 168 hours No results found for this basename: INR, PROTIME     Recent Radiographic Studies :  No results found.  DISCHARGE INSTRUCTIONS: Discharge Orders    Future Orders Please Complete By Expires   Diet general      Call MD / Call 911      Comments:   If you experience chest pain or shortness of breath, CALL 911 and be transported to the hospital emergency room.  If you develope a fever above 101 F, pus (white drainage) or increased drainage or redness at the wound, or calf pain, call your surgeon's office.   Constipation Prevention      Comments:   Drink plenty of fluids.  Prune juice may be helpful.  You may use a stool softener, such as Colace (over the counter) 100 mg twice a day.  Use MiraLax (over the counter) for constipation as needed.   Increase activity slowly as tolerated      Driving restrictions      Comments:   No driving UNTIL AFTER FIRST OFFICE VISIT   Lifting restrictions      Comments:   No lifting for 6 weeks   Discharge instructions      Comments:   May change dressing on Thursday morning then place with band aids over the wounds.  May shower Thursday after dressing change.  Limit weight on the right leg for the first 24-48 hours.  Use crutches or walker till then.  Then gradually increase weight bearing as tolerated.  Ice the knee for the first 24-48 hours ( 20 minutes on and 20 minutes off).      DISCHARGE MEDICATIONS:     Medication List     As of 09/16/2012  8:53 AM    TAKE these medications         aspirin EC 81 MG tablet   Take 1 tablet (81 mg total) by  mouth daily.  Begin on Wednesday evening.      cetirizine 10 MG tablet   Commonly known as: ZYRTEC   Take 10 mg by mouth daily.      estradiol-norethindrone 1-0.5 MG per tablet   Commonly known as: ACTIVELLA   Take 1 tablet by mouth daily.      fluticasone 50 MCG/ACT nasal spray   Commonly known as: FLONASE   Place 2 sprays into the nose daily as needed. For sinus pressure      GNP CALCIUM PLUS 600 +D PO   Take 1 tablet by mouth 2 (two) times daily.      multivitamins ther. w/minerals Tabs   Take 1 tablet by mouth daily.      oxyCODONE-acetaminophen 5-325 MG per tablet   Commonly known as: PERCOCET/ROXICET   1-2 TABS Q 4H PRN PAIN        FOLLOW UP VISIT:       Follow-up Information    Follow up with [facility], Claude Manges, MD. Schedule an appointment as soon as possible for a visit on 09/24/2012.   Contact information:   [address] [place] [address] [phone]          DISPOSITION: Home  CONDITION:  Stable  Jennifer Coleman 09/16/2012, 8:53 AM

## 2012-09-16 NOTE — Anesthesia Postprocedure Evaluation (Signed)
Anesthesia Post Note  Patient: Jennifer Coleman  Procedure(s) Performed: Procedure(s) (LRB): ARTHROSCOPY KNEE (Right)  Anesthesia type: General  Patient location: PACU  Post pain: Pain level controlled and Adequate analgesia  Post assessment: Post-op Vital signs reviewed, Patient's Cardiovascular Status Stable, Respiratory Function Stable, Patent Airway and Pain level controlled  Last Vitals:  Filed Vitals:   09/16/12 0845  BP: 126/76  Pulse: 75  Temp: 36.7 C  Resp: 16    Post vital signs: Reviewed and stable  Level of consciousness: awake, alert  and oriented  Complications: No apparent anesthesia complications

## 2012-09-16 NOTE — H&P (Signed)
Jennifer Campbell, MD   Jacqualine Code, PA-C [address] [place], [place]  [address]                             [phone]   ORTHOPAEDIC HISTORY & PHYSICAL  KARRYN WITHEE MRN:  [id number] DOB/SEX:  Dec 30, 1950/female  CHIEF COMPLAINT:  Painful right knee   HISTORY: Patient is a 61 y.o. female presented with a history of pain in the right knee several months after a twisting injury.  Had an MRI in February revealing a cleft-like defect in the midbody of the lateral meniscus,  This has been very painful and would like to have this remedied with surgery.  PAST MEDICAL HISTORY: There are no active problems to display for this patient.  Past Medical History  Diagnosis Date  . PONV (postoperative nausea and vomiting)   . Family history of anesthesia complication 25 yrs ago    sister allergic to sccinocholine.. unable to come off vent. /or be extubated post op   . Dysrhythmia     pvc s from caffiene.  and allergy medicines.   Marland Kitchen GERD (gastroesophageal reflux disease)     occasionally  from spicy foods.    Past Surgical History  Procedure Date  . Cervical disc surgery 1992    Fusion  . Salpingoophorectomy 1985    left .      MEDICATIONS:   Prescriptions prior to admission  Medication Sig Dispense Refill  . aspirin EC 81 MG tablet Take 81 mg by mouth daily.      . Calcium Carbonate-Vit D-Min (GNP CALCIUM PLUS 600 +D PO) Take 1 tablet by mouth 2 (two) times daily.      . cetirizine (ZYRTEC) 10 MG tablet Take 10 mg by mouth daily.      Marland Kitchen estradiol-norethindrone (ACTIVELLA) 1-0.5 MG per tablet Take 1 tablet by mouth daily.      . fluticasone (FLONASE) 50 MCG/ACT nasal spray Place 2 sprays into the nose daily as needed. For sinus pressure      . Multiple Vitamins-Minerals (MULTIVITAMINS THER. W/MINERALS) TABS Take 1 tablet by mouth daily.        ALLERGIES:   Allergies  Allergen Reactions  . Penicillins Shortness Of Breath, Swelling and Rash  . Shellfish Allergy  Shortness Of Breath and Swelling  . Morphine And Related Nausea And Vomiting  . Codeine Itching    REVIEW OF SYSTEMS:  Negative except for those noted above  FAMILY HISTORY:  History reviewed. No pertinent family history.  SOCIAL HISTORY:   History  Substance Use Topics  . Smoking status: Former Games developer  . Smokeless tobacco: Not on file  . Alcohol Use: No      EXAMINATION: Vital signs in last 24 hours: Temp:  [98 F (36.7 C)-98.4 F (36.9 C)] 98 F (36.7 C) (11/19 0628) Pulse Rate:  [56-60] 60  (11/19 0628) Resp:  [20] 20  (11/19 0628) BP: (123-138)/(82-84) 123/82 mmHg (11/19 0628) SpO2:  [99 %] 99 % (11/19 0628) Weight:  [89.6 kg (197 lb 8.5 oz)] 89.6 kg (197 lb 8.5 oz) (11/18 1026)  Head is normocephalic.   Eyes:  Pupils equal, round and reactive to light and accommodation.  Extraocular intact. ENT: Ears, nose, and throat were benign.   Neck: supple, no bruits were noted.   Chest: good expansion.   Lungs: essentially clear.   Cardiac: regular rhythm and rate, normal S1, S2.  No  murmurs appreciated. Pulses :  1+ bilateral and symmetric in lower extremities. Abdomen is scaphoid, soft, nontender, no masses palpable, normal bowel sounds                  present. CNS:  He is oriented x3 and cranial nerves II-XII grossly intact. Breast, rectal, and genital exams: not performed and not indicated for an orthopedic evaluation. Musculoskeletal: ROM 0- 110 degrees. trace effusion.   ASSESSMENT: Lateral Meniscal tear Right Knee Past Medical History  Diagnosis Date  . PONV (postoperative nausea and vomiting)   . Family history of anesthesia complication 25 yrs ago    sister allergic to sccinocholine.. unable to come off vent. /or be extubated post op   . Dysrhythmia     pvc s from caffiene.  and allergy medicines.   Marland Kitchen GERD (gastroesophageal reflux disease)     occasionally  from spicy foods.     PLAN: Plan for right knee arthroscopy  The procedure,  risks, and  benefits of total knee arthroplasty were presented and reviewed. The risks including but not limited to infection, blood clots, vascular and nerve injury, stiffness,  among others were discussed. The patient acknowledged the explanation, agreed to proceed.   PETRARCA,BRIAN 09/16/2012, 7:27 AM

## 2012-09-16 NOTE — Progress Notes (Signed)
Patient ID: Jennifer Coleman, female   DOB: Feb 10, 1951, 61 y.o.   MRN: [id number] The recent History & Physical has been reviewed. I have personally examined the patient today. There is no interval change to the documented History & Physical. The patient would like to proceed with the procedure.  Norlene Campbell W 09/16/2012,  7:36 AM

## 2012-09-16 NOTE — Anesthesia Procedure Notes (Signed)
Procedure Name: LMA Insertion Date/Time: 09/16/2012 7:48 AM Performed by: Gayla Medicus Pre-anesthesia Checklist: Patient identified, Timeout performed, Suction available, Patient being monitored and Emergency Drugs available Patient Re-evaluated:Patient Re-evaluated prior to inductionOxygen Delivery Method: Circle system utilized Preoxygenation: Pre-oxygenation with 100% oxygen Intubation Type: IV induction LMA: LMA with gastric port inserted LMA Size: 4.0 Number of attempts: 1 Placement Confirmation: positive ETCO2 and breath sounds checked- equal and bilateral Tube secured with: Tape Dental Injury: Teeth and Oropharynx as per pre-operative assessment

## 2012-09-16 NOTE — Anesthesia Preprocedure Evaluation (Signed)
Anesthesia Evaluation  Patient identified by MRN, date of birth, ID band Patient awake    Reviewed: Allergy & Precautions, H&P , NPO status , Patient's Chart, lab work & pertinent test results  History of Anesthesia Complications (+) PONV and Family history of anesthesia reaction  Airway Mallampati: II  Neck ROM: full    Dental   Pulmonary former smoker,          Cardiovascular     Neuro/Psych    GI/Hepatic GERD-  ,  Endo/Other    Renal/GU      Musculoskeletal   Abdominal   Peds  Hematology   Anesthesia Other Findings   Reproductive/Obstetrics                           Anesthesia Physical Anesthesia Plan  ASA: II  Anesthesia Plan: General   Post-op Pain Management:    Induction: Intravenous  Airway Management Planned: LMA  Additional Equipment:   Intra-op Plan:   Post-operative Plan:   Informed Consent: I have reviewed the patients History and Physical, chart, labs and discussed the procedure including the risks, benefits and alternatives for the proposed anesthesia with the patient or authorized representative who has indicated his/her understanding and acceptance.     Plan Discussed with: CRNA and Surgeon  Anesthesia Plan Comments:         Anesthesia Quick Evaluation

## 2012-09-16 NOTE — Transfer of Care (Signed)
Immediate Anesthesia Transfer of Care Note  Patient: Jennifer Coleman  Procedure(s) Performed: Procedure(s) (LRB) with comments: ARTHROSCOPY KNEE (Right) - Right Knee Arthroscopy  Patient Location: PACU  Anesthesia Type:General  Level of Consciousness: awake, alert  and oriented  Airway & Oxygen Therapy: Patient Spontanous Breathing and Patient connected to nasal cannula oxygen  Post-op Assessment: Report given to PACU RN, Post -op Vital signs reviewed and stable and Patient moving all extremities X 4  Post vital signs: Reviewed and stable  Complications: No apparent anesthesia complications

## 2012-09-16 NOTE — Op Note (Signed)
PATIENT ID:      Jennifer Coleman  MRN:     [id number] DOB/AGE:    59-Oct-1952 / 61 y.o.       OPERATIVE REPORT    DATE OF PROCEDURE:  09/16/2012       PREOPERATIVE DIAGNOSIS:   Tear lateral meniscus right knee with lateral compartment OA                                                   There is no height or weight on file to calculate BMI.     POSTOPERATIVE DIAGNOSIS:   same                                                                  There is no height or weight on file to calculate BMI.     PROCEDURE:  Procedure(s) ARTHROSCOPY KNEE right     SURGEON:  Norlene Campbell, MD    ASSISTANT:   Jacqualine Code, PA-C   (Present and scrubbed throughout the case, critical for assistance with exposure, retraction, instrumentation, and closure.)          ANESTHESIA: general     DRAINS: none :      TOURNIQUET TIME: * No tourniquets in log *    COMPLICATIONS:  None   CONDITION:  stable  PROCEDURE IN DETAIL: 119147   [name] W 09/16/2012, 8:33 AM

## 2012-09-17 NOTE — Op Note (Signed)
Jennifer Coleman, Jennifer Coleman NO.:  [id number]  MEDICAL RECORD NO.:  [id number]  LOCATION:  [facility]                         FACILITY:  [facility]  PHYSICIAN:  Claude Manges. Reinaldo Helt, M.D.DATE OF BIRTH:  25-Feb-1951  DATE OF PROCEDURE:  09/16/2012 DATE OF DISCHARGE:  09/16/2012                              OPERATIVE REPORT   PREOPERATIVE DIAGNOSIS:  Tear of lateral meniscus, right knee with lateral compartment osteoarthritis.  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  PROCEDURES: 1. Diagnostic arthroscopy, right knee. 2. Partial lateral meniscectomy. 3. Chondroplasty of medial femoral condyle and patella.  SURGEON:  Claude Manges. Cleophas Dunker, M.D.  ASSISTANT:  Arlys John D. Petrarca, PA-C  ANESTHESIA:  General with local 0.25% Marcaine with epinephrine.  COMPLICATIONS:  None.  HISTORY:  A 61 year old female has been followed in the office for problems referable to both of her knees.  She sustained twisting injury to her right knee in early 2013.  She has had an MRI scan consistent with what appears to be a tear of the lateral meniscus and some tricompartmental degenerative changes, and she has done well initially with cortisone.  She subsequently developed pain in her left knee and has been followed with cortisone injections and anti-inflammatory medicine.  She realizes that she is having more pain on the right than the left knee and now wishes to proceed with a knee arthroscopy.  PROCEDURE:  Jennifer Coleman was met in the holding area, identified the right knee as appropriate operative site.  She was then transported to room #4 and placed under general anesthesia without difficulty.  The right lower extremity was placed in a thigh holder.  The leg was then prepped with DuraPrep from the thigh holder to the midfoot.  Sterile draping was performed.  Time-out was called.  Diagnostic arthroscopy was  performed using a medial and lateral parapatellar tendon puncture site hole.  These were infiltrated preoperatively with 0.25% Marcaine with epinephrine.  Arthroscope was initially placed in the medial portal, there was no effusion. Diagnostic arthroscopy revealed some mild synovitis superior pouch of its medial shelf plica that appeared to be a little thick and this was debrided.  There was diffuse chondromalacia of the patella involving all facets representing grade 2 and some early grade 3 changes and some minimal chondromalacia on the corresponding trochlea.  These were shaved.  So, the cartilage was symmetrical and smoothed.  In the lateral compartment, there was a complex tear of the lateral meniscus involving the anterior half.  There was a large cleavage tear starting at the midpoint extending to the anterior horn associated with some horizontal cleavage tears at the midportion.  These were debrided back to stable meniscal rim.  We then tapered the junction of the abnormal and normal meniscus at its midportion.  I used an ArthroCare wand to stabilize the meniscus and then carefully probed the remaining meniscus was intact. Bleeding was controlled with the ArthroCare wand.  There was a divot of articular cartilage in the mid-weightbearing surface of the lateral femoral condyle.  I thought it was very close to subchondral bone, but I debrided around it  to remove any loose articular cartilage and appeared to be at that point stable.  The ACL appeared to be thin at its attachment to the femoral condyles, but had negative anterior drawer sign.  The medial compartment was relatively clear of chondromalacia with what appeared to be grade 1 changes, but the meniscus was intact.  There was no loose material.  The joint was then explored with evidence of loose material, bleeding appeared to be under control with the ArthroCare wand.  The arthroscope was then removed from the joint.  The  both arthroscopic portals were infiltrated with 0.25% Marcaine with epinephrine.  Sterile bulky dressing was applied followed by an Ace bandage.  PLAN:  Oxycodone for pain.  Office 1 week.     Claude Manges. Cleophas Dunker, M.D.     PWW/MEDQ  D:  09/16/2012  T:  09/17/2012  Job:  [id number]

## 2012-09-18 ENCOUNTER — Encounter (HOSPITAL_COMMUNITY): Payer: Self-pay | Admitting: Orthopaedic Surgery

## 2012-09-19 ENCOUNTER — Encounter (HOSPITAL_COMMUNITY): Payer: Self-pay

## 2012-09-19 ENCOUNTER — Other Ambulatory Visit: Payer: Self-pay | Admitting: Physician Assistant

## 2012-10-01 ENCOUNTER — Ambulatory Visit: Payer: 59 | Attending: Orthopaedic Surgery | Admitting: Physical Therapy

## 2012-10-01 DIAGNOSIS — IMO0001 Reserved for inherently not codable concepts without codable children: Secondary | ICD-10-CM | POA: Insufficient documentation

## 2012-10-01 DIAGNOSIS — M25569 Pain in unspecified knee: Secondary | ICD-10-CM | POA: Insufficient documentation

## 2012-10-01 DIAGNOSIS — M25669 Stiffness of unspecified knee, not elsewhere classified: Secondary | ICD-10-CM | POA: Insufficient documentation

## 2012-10-01 DIAGNOSIS — R5381 Other malaise: Secondary | ICD-10-CM | POA: Insufficient documentation

## 2012-10-03 ENCOUNTER — Ambulatory Visit: Payer: 59 | Admitting: Physical Therapy

## 2012-10-07 ENCOUNTER — Ambulatory Visit: Payer: 59 | Admitting: Physical Therapy

## 2012-10-08 ENCOUNTER — Ambulatory Visit (HOSPITAL_COMMUNITY)
Admission: RE | Admit: 2012-10-08 | Discharge: 2012-10-08 | Disposition: A | Discharge status: Home / Self Care | Payer: 59 | Source: Ambulatory Visit | Attending: Orthopaedic Surgery | Admitting: Orthopaedic Surgery

## 2012-10-08 ENCOUNTER — Other Ambulatory Visit (HOSPITAL_COMMUNITY): Payer: Self-pay | Admitting: Orthopaedic Surgery

## 2012-10-08 DIAGNOSIS — M79606 Pain in leg, unspecified: Secondary | ICD-10-CM

## 2012-10-08 DIAGNOSIS — M79609 Pain in unspecified limb: Secondary | ICD-10-CM | POA: Insufficient documentation

## 2012-10-08 DIAGNOSIS — M7989 Other specified soft tissue disorders: Secondary | ICD-10-CM

## 2012-10-09 ENCOUNTER — Ambulatory Visit: Payer: 59 | Admitting: Physical Therapy

## 2012-10-14 ENCOUNTER — Ambulatory Visit: Payer: 59 | Admitting: Physical Therapy

## 2012-10-16 ENCOUNTER — Encounter: Payer: 59 | Admitting: Physical Therapy

## 2012-10-20 ENCOUNTER — Ambulatory Visit: Payer: 59 | Admitting: Physical Therapy

## 2013-01-27 ENCOUNTER — Other Ambulatory Visit (HOSPITAL_COMMUNITY): Payer: Self-pay | Admitting: Family Medicine

## 2013-06-10 ENCOUNTER — Other Ambulatory Visit: Payer: Self-pay | Admitting: Physician Assistant

## 2013-07-10 ENCOUNTER — Encounter: Payer: Self-pay | Admitting: Family Medicine

## 2013-07-10 ENCOUNTER — Ambulatory Visit (INDEPENDENT_AMBULATORY_CARE_PROVIDER_SITE_OTHER): Payer: 59 | Admitting: Family Medicine

## 2013-07-10 VITALS — BP 130/80 | Temp 98.7°F | Ht 66.0 in | Wt 202.0 lb

## 2013-07-10 DIAGNOSIS — J329 Chronic sinusitis, unspecified: Secondary | ICD-10-CM

## 2013-07-10 MED ORDER — IBUPROFEN 800 MG PO TABS: 800.0000 mg | ORAL_TABLET | Freq: Four times a day (QID) | ORAL | Status: DC | PRN

## 2013-07-10 MED ORDER — CLARITHROMYCIN 500 MG PO TABS: 500.0000 mg | ORAL_TABLET | Freq: Two times a day (BID) | ORAL | Status: AC

## 2013-07-10 NOTE — Progress Notes (Signed)
  Subjective:    Patient ID: Jennifer Coleman, female    DOB: 1951/02/07, 62 y.o.   MRN: [id number]  Sinusitis This is a new problem. The current episode started 1 to 4 weeks ago. The problem is unchanged. The maximum temperature recorded prior to her arrival was 100 - 100.9 F. The fever has been present for less than 1 day. The pain is mild. Associated symptoms include ear pain, headaches, sinus pressure and a sore throat. Past treatments include oral decongestants. The treatment provided mild relief.  mid aug  Developed cough and cong and sudafed  Kicked in labor day weekend  Felt feverish and didn't have a fever decr tight up fornt.     Review of Systems  HENT: Positive for ear pain, sore throat and sinus pressure.   Neurological: Positive for headaches.   Otherwise negative    Objective:   Physical Exam Alert hydration good. Vitals reviewed. Neck supple. Frontal tenderness. Nasal congestion. Lungs clear. Heart regular in rhythm.       Assessment & Plan:  Impression acute rhinosinusitis. Plan antibiotics prescribed. Symptomatic care discussed. Warning signs discussed. WSL

## 2013-08-13 ENCOUNTER — Other Ambulatory Visit (HOSPITAL_COMMUNITY): Payer: Self-pay | Admitting: Obstetrics and Gynecology

## 2013-08-13 DIAGNOSIS — Z139 Encounter for screening, unspecified: Secondary | ICD-10-CM

## 2013-08-27 ENCOUNTER — Ambulatory Visit (HOSPITAL_COMMUNITY)
Admission: RE | Admit: 2013-08-27 | Discharge: 2013-08-27 | Disposition: A | Discharge status: Home / Self Care | Payer: 59 | Source: Ambulatory Visit | Attending: Obstetrics and Gynecology | Admitting: Obstetrics and Gynecology

## 2013-08-27 DIAGNOSIS — Z1231 Encounter for screening mammogram for malignant neoplasm of breast: Secondary | ICD-10-CM | POA: Insufficient documentation

## 2013-08-27 DIAGNOSIS — Z139 Encounter for screening, unspecified: Secondary | ICD-10-CM

## 2013-09-03 ENCOUNTER — Other Ambulatory Visit: Payer: Self-pay

## 2013-09-21 ENCOUNTER — Other Ambulatory Visit (HOSPITAL_COMMUNITY): Payer: 59

## 2013-12-28 ENCOUNTER — Ambulatory Visit (INDEPENDENT_AMBULATORY_CARE_PROVIDER_SITE_OTHER): Payer: 59 | Admitting: Family Medicine

## 2013-12-28 ENCOUNTER — Encounter: Payer: Self-pay | Admitting: Family Medicine

## 2013-12-28 VITALS — BP 134/90 | Temp 97.8°F | Ht 66.0 in | Wt 211.2 lb

## 2013-12-28 DIAGNOSIS — J329 Chronic sinusitis, unspecified: Secondary | ICD-10-CM

## 2013-12-28 MED ORDER — IBUPROFEN 800 MG PO TABS: 800.0000 mg | ORAL_TABLET | Freq: Four times a day (QID) | ORAL | Status: DC | PRN

## 2013-12-28 MED ORDER — FLUTICASONE PROPIONATE 50 MCG/ACT NA SUSP: 2.0000 | Freq: Every day | NASAL | Status: DC

## 2013-12-28 MED ORDER — LEVOFLOXACIN 500 MG PO TABS: 500.0000 mg | ORAL_TABLET | Freq: Every day | ORAL | Status: DC

## 2013-12-28 NOTE — Progress Notes (Signed)
   Subjective:    Patient ID: Jennifer Coleman, female    DOB: 27-Dec-1950, 63 y.o.   MRN: [id number]  Sinusitis This is a new problem. The current episode started more than 1 month ago. The problem has been gradually worsening since onset. There has been no fever. Her pain is at a severity of 8/10. The pain is moderate. Associated symptoms include congestion, coughing, ear pain, headaches, sinus pressure and a sore throat. Past treatments include oral decongestants. The treatment provided no relief.  Patient states she has no concerns at this time during this visit.   Covered by flu, hx of severe sinus infxn in the past  Review of Systems  HENT: Positive for congestion, ear pain, sinus pressure and sore throat.   Respiratory: Positive for cough.   Neurological: Positive for headaches.       Objective:   Physical Exam  Alert no acute distress. Lungs clear. Heart regular in rhythm. H&T moderate his congestion frontal tenderness. Moderate malaise. Vitals reviewed.      Assessment & Plan:  Impression acute rhinosinusitis plan antibiotics prescribed. Symptomatic care discussed. Warning signs discussed. WSL

## 2014-01-12 ENCOUNTER — Encounter: Payer: Self-pay | Admitting: Family Medicine

## 2014-01-12 ENCOUNTER — Ambulatory Visit (INDEPENDENT_AMBULATORY_CARE_PROVIDER_SITE_OTHER): Payer: 59 | Admitting: Family Medicine

## 2014-01-12 VITALS — BP 132/88 | Temp 98.6°F | Ht 66.0 in | Wt 211.0 lb

## 2014-01-12 DIAGNOSIS — B029 Zoster without complications: Secondary | ICD-10-CM

## 2014-01-12 MED ORDER — VALACYCLOVIR HCL 1 G PO TABS: 1000.0000 mg | ORAL_TABLET | Freq: Three times a day (TID) | ORAL | Status: DC

## 2014-01-12 MED ORDER — HYDROCODONE-ACETAMINOPHEN 5-325 MG PO TABS: ORAL_TABLET | ORAL | Status: DC

## 2014-01-12 NOTE — Progress Notes (Signed)
   Subjective:    Patient ID: ARBUTUS NELLIGAN, female    DOB: April 02, 1951, 63 y.o.   MRN: [id number]  Sinusitis This is a recurrent problem. The current episode started more than 1 month ago. Maximum temperature: fever 99. Associated symptoms include ear pain, headaches and a sore throat. (Left eye pain, facial pain on left side) Past treatments include antibiotics. The treatment provided no relief.    Bad pain on Sunday, cong and drainage  And now a rash  Not a lot of cough runny nose or congestion. Next  Pain bad primarily on left for head I and cheek.     Review of Systems  HENT: Positive for ear pain and sore throat.   Neurological: Positive for headaches.   no vomiting or diarrhea ROS otherwise negative     Objective:   Physical Exam  Alert mild malaise. Lungs clear. Heart regular in rhythm. Neck supple. Left for head cheek superior scalp early patchy rash with question early blistering.      Assessment & Plan:  Impression probable early shingles discussed at length plan Valtrex 1 g 3 times a day 7 days. Hydrocodone when necessary for pain. Symptomatic care discussed. Encouraged to see eye doctor with location of shingles. WSL

## 2014-01-12 NOTE — Patient Instructions (Signed)
Shingles Shingles (herpes zoster) is an infection that is caused by the same virus that causes chickenpox (varicella). The infection causes a painful skin rash and fluid-filled blisters, which eventually break open, crust over, and heal. It may occur in any area of the body, but it usually affects only one side of the body or face. The pain of shingles usually lasts about 1 month. However, some people with shingles may develop long-term (chronic) pain in the affected area of the body. Shingles often occurs many years after the person had chickenpox. It is more common:  In people older than 50 years.  In people with weakened immune systems, such as those with HIV, AIDS, or cancer.  In people taking medicines that weaken the immune system, such as transplant medicines.  In people under great stress. CAUSES  Shingles is caused by the varicella zoster virus (VZV), which also causes chickenpox. After a person is infected with the virus, it can remain in the person's body for years in an inactive state (dormant). To cause shingles, the virus reactivates and breaks out as an infection in a nerve root. The virus can be spread from person to person (contagious) through contact with open blisters of the shingles rash. It will only spread to people who have not had chickenpox. When these people are exposed to the virus, they may develop chickenpox. They will not develop shingles. Once the blisters scab over, the person is no longer contagious and cannot spread the virus to others. SYMPTOMS  Shingles shows up in stages. The initial symptoms may be pain, itching, and tingling in an area of the skin. This pain is usually described as burning, stabbing, or throbbing.In a few days or weeks, a painful red rash will appear in the area where the pain, itching, and tingling were felt. The rash is usually on one side of the body in a band or belt-like pattern. Then, the rash usually turns into fluid-filled blisters. They  will scab over and dry up in approximately 2 3 weeks. Flu-like symptoms may also occur with the initial symptoms, the rash, or the blisters. These may include:  Fever.  Chills.  Headache.  Upset stomach. DIAGNOSIS  Your caregiver will perform a skin exam to diagnose shingles. Skin scrapings or fluid samples may also be taken from the blisters. This sample will be examined under a microscope or sent to a lab for further testing. TREATMENT  There is no specific cure for shingles. Your caregiver will likely prescribe medicines to help you manage the pain, recover faster, and avoid long-term problems. This may include antiviral drugs, anti-inflammatory drugs, and pain medicines. HOME CARE INSTRUCTIONS   Take a cool bath or apply cool compresses to the area of the rash or blisters as directed. This may help with the pain and itching.   Only take over-the-counter or prescription medicines as directed by your caregiver.   Rest as directed by your caregiver.  Keep your rash and blisters clean with mild soap and cool water or as directed by your caregiver.  Do not pick your blisters or scratch your rash. Apply an anti-itch cream or numbing creams to the affected area as directed by your caregiver.  Keep your shingles rash covered with a loose bandage (dressing).  Avoid skin contact with:  Babies.   Pregnant women.   Children with eczema.   Elderly people with transplants.   People with chronic illnesses, such as leukemia or AIDS.   Wear loose-fitting clothing to help ease   the pain of material rubbing against the rash.  Keep all follow-up appointments with your caregiver.If the area involved is on your face, you may receive a referral for follow-up to a specialist, such as an eye doctor (ophthalmologist) or an ear, nose, and throat (ENT) doctor. Keeping all follow-up appointments will help you avoid eye complications, chronic pain, or disability.  SEEK IMMEDIATE MEDICAL  CARE IF:   You have facial pain, pain around the eye area, or loss of feeling on one side of your face.  You have ear pain or ringing in your ear.  You have loss of taste.  Your pain is not relieved with prescribed medicines.   Your redness or swelling spreads.   You have more pain and swelling.  Your condition is worsening or has changed.   You have a feveror persistent symptoms for more than 2 3 days.  You have a fever and your symptoms suddenly get worse. MAKE SURE YOU:  Understand these instructions.  Will watch your condition.  Will get help right away if you are not doing well or get worse. Document Released: 10/15/2005 Document Revised: 07/09/2012 Document Reviewed: 05/29/2012 [employer] Patient Information 2014 [employer].  

## 2014-01-28 ENCOUNTER — Telehealth: Payer: Self-pay | Admitting: Family Medicine

## 2014-01-28 MED ORDER — CLARITHROMYCIN 500 MG PO TABS: 500.0000 mg | ORAL_TABLET | Freq: Two times a day (BID) | ORAL | Status: DC

## 2014-01-28 NOTE — Telephone Encounter (Addendum)
Seen on 03/17 dx shingles. Seen 03/02 dx rhinosinusitis. Prescribed flonase. Patient states she took levaquin but i could not find that on med list. Now having wheezing she is using inhaler, headache, sinus pressure, chest congestion, fever 99.2.

## 2014-01-28 NOTE — Telephone Encounter (Signed)
Pt's sinus infection has returned with cough, leaving in the morning going out of town, would like an antibiotic called in to [facility] in [place], please call when done

## 2014-01-28 NOTE — Telephone Encounter (Signed)
biaxin 500 bid ten d 

## 2014-01-28 NOTE — Telephone Encounter (Signed)
Med sent to pharm. Pt notified.  

## 2014-03-05 ENCOUNTER — Encounter (HOSPITAL_COMMUNITY): Payer: Self-pay | Admitting: Emergency Medicine

## 2014-03-05 ENCOUNTER — Emergency Department (HOSPITAL_COMMUNITY): Payer: 59

## 2014-03-05 ENCOUNTER — Emergency Department (HOSPITAL_COMMUNITY)
Admission: EM | Admit: 2014-03-05 | Discharge: 2014-03-05 | Disposition: A | Discharge status: Home / Self Care | Payer: 59 | Attending: Emergency Medicine | Admitting: Emergency Medicine

## 2014-03-05 DIAGNOSIS — K219 Gastro-esophageal reflux disease without esophagitis: Secondary | ICD-10-CM | POA: Insufficient documentation

## 2014-03-05 DIAGNOSIS — W010XXA Fall on same level from slipping, tripping and stumbling without subsequent striking against object, initial encounter: Secondary | ICD-10-CM | POA: Insufficient documentation

## 2014-03-05 DIAGNOSIS — Z87891 Personal history of nicotine dependence: Secondary | ICD-10-CM | POA: Insufficient documentation

## 2014-03-05 DIAGNOSIS — IMO0002 Reserved for concepts with insufficient information to code with codable children: Secondary | ICD-10-CM | POA: Insufficient documentation

## 2014-03-05 DIAGNOSIS — Y929 Unspecified place or not applicable: Secondary | ICD-10-CM | POA: Insufficient documentation

## 2014-03-05 DIAGNOSIS — S52513A Displaced fracture of unspecified radial styloid process, initial encounter for closed fracture: Secondary | ICD-10-CM

## 2014-03-05 DIAGNOSIS — Z79899 Other long term (current) drug therapy: Secondary | ICD-10-CM | POA: Insufficient documentation

## 2014-03-05 DIAGNOSIS — S298XXA Other specified injuries of thorax, initial encounter: Secondary | ICD-10-CM | POA: Insufficient documentation

## 2014-03-05 DIAGNOSIS — S79929A Unspecified injury of unspecified thigh, initial encounter: Secondary | ICD-10-CM

## 2014-03-05 DIAGNOSIS — S52599A Other fractures of lower end of unspecified radius, initial encounter for closed fracture: Secondary | ICD-10-CM | POA: Insufficient documentation

## 2014-03-05 DIAGNOSIS — Y9389 Activity, other specified: Secondary | ICD-10-CM | POA: Insufficient documentation

## 2014-03-05 DIAGNOSIS — Z880 Allergy status to penicillin: Secondary | ICD-10-CM | POA: Insufficient documentation

## 2014-03-05 DIAGNOSIS — S79919A Unspecified injury of unspecified hip, initial encounter: Secondary | ICD-10-CM | POA: Insufficient documentation

## 2014-03-05 DIAGNOSIS — Z8679 Personal history of other diseases of the circulatory system: Secondary | ICD-10-CM | POA: Insufficient documentation

## 2014-03-05 DIAGNOSIS — Z791 Long term (current) use of non-steroidal anti-inflammatories (NSAID): Secondary | ICD-10-CM | POA: Insufficient documentation

## 2014-03-05 NOTE — Discharge Instructions (Signed)
Radial Fracture You have a broken bone (fracture) of the forearm. This is the part of your arm between the elbow and your wrist. Your forearm is made up of two bones. These are the radius and ulna. Your fracture is in the radial shaft. This is the bone in your forearm located on the thumb side. A cast or splint is used to protect and keep your injured bone from moving. The cast or splint will be on generally for about 5 to 6 weeks, with individual variations. HOME CARE INSTRUCTIONS   Keep the injured part elevated while sitting or lying down. Keep the injury above the level of your heart (the center of the chest). This will decrease swelling and pain.  Apply ice to the injury for 15-20 minutes, 03-04 times per day while awake, for 2 days. Put the ice in a plastic bag and place a towel between the bag of ice and your cast or splint.  Move your fingers to avoid stiffness and minimize swelling.  If you have a plaster or fiberglass cast:  Do not try to scratch the skin under the cast using sharp or pointed objects.  Check the skin around the cast every day. You may put lotion on any red or sore areas.  Keep your cast dry and clean.  If you have a plaster splint:  Wear the splint as directed.  You may loosen the elastic around the splint if your fingers become numb, tingle, or turn cold or blue.  Do not put pressure on any part of your cast or splint. It may break. Rest your cast only on a pillow for the first 24 hours until it is fully hardened.  Your cast or splint can be protected during bathing with a plastic bag. Do not lower the cast or splint into water.  Only take over-the-counter or prescription medicines for pain, discomfort, or fever as directed by your caregiver. SEEK IMMEDIATE MEDICAL CARE IF:   Your cast gets damaged or breaks.  You have more severe pain or swelling than you did before getting the cast.  You have severe pain when stretching your fingers.  There is a bad  smell, new stains and/or pus-like (purulent) drainage coming from under the cast.  Your fingers or hand turn pale or blue and become cold or your loose feeling. Document Released: 03/28/2006 Document Revised: 01/07/2012 Document Reviewed: 06/24/2006 Exit[employer] Patient Information 2014 [employer].  

## 2014-03-05 NOTE — ED Notes (Addendum)
Patient given discharge instruction, verbalized understand. Patient ambulatory out of the department.  

## 2014-03-05 NOTE — ED Provider Notes (Signed)
CSN: [id number]     Arrival date & time 03/05/14  1713 History   First MD Initiated Contact with Patient 03/05/14 1801     Chief Complaint  Patient presents with  . Fall     (Consider location/radiation/quality/duration/timing/severity/associated sxs/prior Treatment) HPI Comments: MAJESTI GAMBRELL is a 63 y.o. female who presents to the Emergency Department complaining of pain to her wrist wrist, hip and right ribs after a fall.  States that she tripped fell on her right side and out stretched hand.  C/o pain mainly to the wrist.  Pain to her wrist is worse with movement and bending, improves with rest.  States that her hip "feels sore".  She denies shortness of breath, abdominal pain, numbness or weakness, neck pain , head injury or LOC.    The history is provided by the patient.    Past Medical History  Diagnosis Date  . PONV (postoperative nausea and vomiting)   . Family history of anesthesia complication 25 yrs ago    sister allergic to sccinocholine.. unable to come off vent. /or be extubated post op   . Dysrhythmia     pvc s from caffiene.  and allergy medicines.   Marland Kitchen GERD (gastroesophageal reflux disease)     occasionally  from spicy foods.    Past Surgical History  Procedure Laterality Date  . Cervical disc surgery  1992    Fusion  . Salpingoophorectomy  1985    left .   . Knee arthroscopy  09/16/2012    Procedure: ARTHROSCOPY KNEE;  Surgeon: [name], MD;  Location: [facility];  Service: Orthopedics;  Laterality: Right;  Right Knee Arthroscopy  . Carpal tunnel release     History reviewed. No pertinent family history. History  Substance Use Topics  . Smoking status: Former Research scientist (life sciences)  . Smokeless tobacco: Former Systems developer    Quit date: 01/12/1997  . Alcohol Use: No   OB History   Grav Para Term Preterm Abortions TAB SAB Ect Mult Living                 Review of Systems  Constitutional: Negative for fever and chills.  Genitourinary: Negative for dysuria and difficulty  urinating.  Musculoskeletal: Positive for arthralgias and joint swelling.       Tenderness to right hip, wrist, and lateral right ribs  Skin: Negative for color change and wound.  All other systems reviewed and are negative.     Allergies  Penicillins; Shellfish allergy; Morphine and related; and Codeine  Home Medications   Prior to Admission medications   Medication Sig Start Date End Date Taking? Authorizing Provider  Calcium Carbonate-Vit D-Min (GNP CALCIUM PLUS 600 +D PO) Take 1 tablet by mouth 2 (two) times daily.   Yes Historical Provider, MD  cetirizine (ZYRTEC) 10 MG tablet Take 10 mg by mouth daily.   Yes Historical Provider, MD  estrogen-methylTESTOSTERone (ESTRATEST) 1.25-2.5 MG per tablet Take 1 tablet by mouth daily.   Yes Historical Provider, MD  fluticasone (FLONASE) 50 MCG/ACT nasal spray Place 2 sprays into both nostrils daily. For sinus pressure 12/28/13  Yes Mikey Kirschner, MD  ibuprofen (ADVIL,MOTRIN) 800 MG tablet Take 1 tablet (800 mg total) by mouth every 6 (six) hours as needed. 12/28/13  Yes Mikey Kirschner, MD  Multiple Vitamins-Minerals (MULTIVITAMINS THER. W/MINERALS) TABS Take 1 tablet by mouth daily.   Yes Historical Provider, MD   BP 154/80  Pulse 77  Temp(Src) 98.6 F (37 C) (Oral)  Resp  18  Ht 5\' 6"  (1.676 m)  Wt 210 lb (95.255 kg)  BMI 33.91 kg/m2  SpO2 98% Physical Exam  Nursing note and vitals reviewed. Constitutional: She is oriented to person, place, and time. She appears well-developed and well-nourished. No distress.  HENT:  Head: Normocephalic and atraumatic.  Neck: Normal range of motion. Neck supple.  Cardiovascular: Normal rate, regular rhythm, normal heart sounds and intact distal pulses.   No murmur heard. Pulmonary/Chest: Effort normal and breath sounds normal. Chest wall is not dull to percussion. She exhibits tenderness and bony tenderness. She exhibits no mass, no laceration, no crepitus, no edema, no deformity, no swelling and  no retraction.    Diffuse ttp of the lateral right ribs, no edema, bruising or abrasions.  No crepitus or guarding  Abdominal: Soft. Normal appearance. There is no tenderness. There is no rigidity, no rebound, no guarding and no CVA tenderness.  Musculoskeletal: She exhibits edema and tenderness.  ttp of the distal right wrist, mild STS present.  Radial pulse is brisk, distal sensation intact.  CR< 2 sec.  No bruising or bony deformity.  Patient has full ROM. Compartments soft.  Neurological: She is alert and oriented to person, place, and time. She exhibits normal muscle tone. Coordination normal.  Skin: Skin is warm and dry.    ED Course  Procedures (including critical care time) Labs Review Labs Reviewed - No data to display  Imaging Review Dg Ribs Unilateral W/chest Right  03/05/2014   CLINICAL DATA:  FALL  EXAM: RIGHT RIBS AND CHEST - 3+ VIEW  COMPARISON:  None.  FINDINGS: No fracture or other bone lesions are seen involving the ribs. There is no evidence of pneumothorax or pleural effusion. Both lungs are clear. Heart size and mediastinal contours are within normal limits.  IMPRESSION: Negative.   Electronically Signed   By: Margaree Mackintosh M.D.   On: 03/05/2014 18:48   Dg Wrist Complete Right  03/05/2014   CLINICAL DATA:  Radial sided wrist pain after fall today  EXAM: RIGHT WRIST - COMPLETE 3+ VIEW  COMPARISON:  None.  FINDINGS: Nondisplaced horizontal fracture through the radial styloid process. No other abnormalities.  IMPRESSION: Nondisplaced fracture distal radius   Electronically Signed   By: Skipper Cliche M.D.   On: 03/05/2014 18:22     EKG Interpretation None      MDM   Final diagnoses:  Closed fracture of radial styloid    X-ray findings discussed with the patient. We'll place in a thumb spica splint.  She also has tenderness to palpation of the anatomical snuffbox which is concerning for a possible occult scaphoid fracture.  Patient prefers to followup with Dr.  Durward Fortes next week. She is otherwise well-appearing and ambulatory. No focal neuro deficits. Vital signs are stable and she appears stable for discharge at this time. Patient states that she has hydrocodone at home to take for pain if needed.  Thumb spica splint applied , pain improved, remains NV intact  Orel Cooler L. Vanessa [place], PA-C 03/07/14 2023

## 2014-03-05 NOTE — ED Notes (Signed)
Tripped and fell, Pain rt wrist. Rt ribs, rt hip.  , No LOC.

## 2014-03-07 NOTE — ED Provider Notes (Signed)
Medical screening examination/treatment/procedure(s) were performed by non-physician practitioner and as supervising physician I was immediately available for consultation/collaboration.   EKG Interpretation None        Maudry Diego, MD 03/07/14 2131

## 2014-03-24 ENCOUNTER — Other Ambulatory Visit (HOSPITAL_COMMUNITY): Payer: Self-pay | Admitting: Orthopaedic Surgery

## 2014-03-24 DIAGNOSIS — S52513A Displaced fracture of unspecified radial styloid process, initial encounter for closed fracture: Secondary | ICD-10-CM

## 2014-03-24 DIAGNOSIS — M25539 Pain in unspecified wrist: Secondary | ICD-10-CM

## 2014-03-29 ENCOUNTER — Ambulatory Visit (HOSPITAL_COMMUNITY)
Admission: RE | Admit: 2014-03-29 | Discharge: 2014-03-29 | Disposition: A | Discharge status: Home / Self Care | Payer: 59 | Source: Ambulatory Visit | Attending: Orthopaedic Surgery | Admitting: Orthopaedic Surgery

## 2014-03-29 DIAGNOSIS — Y929 Unspecified place or not applicable: Secondary | ICD-10-CM | POA: Insufficient documentation

## 2014-03-29 DIAGNOSIS — M25539 Pain in unspecified wrist: Secondary | ICD-10-CM

## 2014-03-29 DIAGNOSIS — S52599A Other fractures of lower end of unspecified radius, initial encounter for closed fracture: Secondary | ICD-10-CM | POA: Insufficient documentation

## 2014-03-29 DIAGNOSIS — S52513A Displaced fracture of unspecified radial styloid process, initial encounter for closed fracture: Secondary | ICD-10-CM

## 2014-03-29 DIAGNOSIS — X58XXXA Exposure to other specified factors, initial encounter: Secondary | ICD-10-CM | POA: Insufficient documentation

## 2014-08-13 ENCOUNTER — Other Ambulatory Visit: Payer: Self-pay

## 2014-09-01 ENCOUNTER — Other Ambulatory Visit (HOSPITAL_COMMUNITY): Payer: Self-pay | Admitting: Obstetrics and Gynecology

## 2014-09-01 DIAGNOSIS — Z1231 Encounter for screening mammogram for malignant neoplasm of breast: Secondary | ICD-10-CM

## 2014-09-02 ENCOUNTER — Ambulatory Visit (HOSPITAL_COMMUNITY)
Admission: RE | Admit: 2014-09-02 | Discharge: 2014-09-02 | Disposition: A | Discharge status: Home / Self Care | Payer: 59 | Source: Ambulatory Visit | Attending: Obstetrics and Gynecology | Admitting: Obstetrics and Gynecology

## 2014-09-02 DIAGNOSIS — Z1231 Encounter for screening mammogram for malignant neoplasm of breast: Secondary | ICD-10-CM | POA: Diagnosis present

## 2014-09-04 ENCOUNTER — Emergency Department (HOSPITAL_COMMUNITY)
Admission: EM | Admit: 2014-09-04 | Discharge: 2014-09-04 | Disposition: A | Discharge status: Home / Self Care | Payer: 59 | Attending: Emergency Medicine | Admitting: Emergency Medicine

## 2014-09-04 ENCOUNTER — Emergency Department (HOSPITAL_COMMUNITY): Payer: 59

## 2014-09-04 ENCOUNTER — Encounter (HOSPITAL_COMMUNITY): Payer: Self-pay

## 2014-09-04 DIAGNOSIS — K219 Gastro-esophageal reflux disease without esophagitis: Secondary | ICD-10-CM | POA: Insufficient documentation

## 2014-09-04 DIAGNOSIS — Z79899 Other long term (current) drug therapy: Secondary | ICD-10-CM | POA: Insufficient documentation

## 2014-09-04 DIAGNOSIS — Z880 Allergy status to penicillin: Secondary | ICD-10-CM | POA: Diagnosis not present

## 2014-09-04 DIAGNOSIS — Z7951 Long term (current) use of inhaled steroids: Secondary | ICD-10-CM | POA: Insufficient documentation

## 2014-09-04 DIAGNOSIS — R0602 Shortness of breath: Secondary | ICD-10-CM | POA: Diagnosis not present

## 2014-09-04 DIAGNOSIS — Z87891 Personal history of nicotine dependence: Secondary | ICD-10-CM | POA: Insufficient documentation

## 2014-09-04 DIAGNOSIS — R079 Chest pain, unspecified: Secondary | ICD-10-CM | POA: Insufficient documentation

## 2014-09-04 LAB — CBC WITH DIFFERENTIAL/PLATELET
BASOS PCT: 0 % (ref 0–1)
Basophils Absolute: 0 10*3/uL (ref 0.0–0.1)
EOS ABS: 0.1 10*3/uL (ref 0.0–0.7)
EOS PCT: 1 % (ref 0–5)
HEMATOCRIT: 42.3 % (ref 36.0–46.0)
HEMOGLOBIN: 14.5 g/dL (ref 12.0–15.0)
Lymphocytes Relative: 21 % (ref 12–46)
Lymphs Abs: 1.9 10*3/uL (ref 0.7–4.0)
MCH: 32.4 pg (ref 26.0–34.0)
MCHC: 34.3 g/dL (ref 30.0–36.0)
MCV: 94.4 fL (ref 78.0–100.0)
MONO ABS: 0.7 10*3/uL (ref 0.1–1.0)
MONOS PCT: 7 % (ref 3–12)
Neutro Abs: 6.4 10*3/uL (ref 1.7–7.7)
Neutrophils Relative %: 71 % (ref 43–77)
Platelets: 259 10*3/uL (ref 150–400)
RBC: 4.48 MIL/uL (ref 3.87–5.11)
RDW: 12.2 % (ref 11.5–15.5)
WBC: 9.1 10*3/uL (ref 4.0–10.5)

## 2014-09-04 LAB — PROTIME-INR
INR: 0.96 (ref 0.00–1.49)
PROTHROMBIN TIME: 12.9 s (ref 11.6–15.2)

## 2014-09-04 LAB — BASIC METABOLIC PANEL
Anion gap: 12 (ref 5–15)
BUN: 25 mg/dL — AB (ref 6–23)
CO2: 28 mEq/L (ref 19–32)
CREATININE: 1.07 mg/dL (ref 0.50–1.10)
Calcium: 9.8 mg/dL (ref 8.4–10.5)
Chloride: 106 mEq/L (ref 96–112)
GFR, EST AFRICAN AMERICAN: 64 mL/min — AB (ref >90)
GFR, EST NON AFRICAN AMERICAN: 55 mL/min — AB (ref >90)
GLUCOSE: 102 mg/dL — AB (ref 70–99)
Potassium: 3.5 mEq/L — ABNORMAL LOW (ref 3.7–5.3)
Sodium: 146 mEq/L (ref 137–147)

## 2014-09-04 LAB — TROPONIN I
Troponin I: <0.3 ng/mL (ref <0.30)
Troponin I: <0.3 ng/mL (ref <0.30)

## 2014-09-04 LAB — APTT: aPTT: 36 seconds (ref 24–37)

## 2014-09-04 LAB — MAGNESIUM: Magnesium: 2.2 mg/dL (ref 1.5–2.5)

## 2014-09-04 LAB — D-DIMER, QUANTITATIVE (NOT AT ARMC)

## 2014-09-04 MED ORDER — NITROGLYCERIN 2 % TD OINT
1.0000 [in_us] | TOPICAL_OINTMENT | Freq: Once | TRANSDERMAL | Status: AC
  Administered 2014-09-04: 1 [in_us] via TOPICAL
  Filled 2014-09-04: qty 1

## 2014-09-04 MED ORDER — ASPIRIN EC 325 MG PO TBEC
325.0000 mg | DELAYED_RELEASE_TABLET | Freq: Once | ORAL | Status: AC
  Administered 2014-09-04: 325 mg via ORAL
  Filled 2014-09-04: qty 1

## 2014-09-04 MED ORDER — NITROGLYCERIN 0.4 MG SL SUBL: 0.4000 mg | SUBLINGUAL_TABLET | SUBLINGUAL | Status: DC | PRN

## 2014-09-04 NOTE — ED Notes (Signed)
Pt complain of chest pain that started around noon today. States she has had three episodes today. Also, states she has had nausea and the pain is radiating to her left arm and neck.

## 2014-09-04 NOTE — ED Notes (Signed)
Discharge instructions and prescription given and reviewed with patient.  Patient verbalized understanding to take medication as directed, follow up with cardiology and return to ED for further chest pains.  Patient ambulatory; refused wheelchair; discharged home in good condition.

## 2014-09-04 NOTE — ED Provider Notes (Signed)
CSN: [id number]     Arrival date & time 09/04/14  1655 History   First MD Initiated Contact with Patient 09/04/14 1715     Chief Complaint  Patient presents with  . Chest Pain     (Consider location/radiation/quality/duration/timing/severity/associated sxs/prior Treatment) HPI  Patient reports she felt fine this morning. She states this afternoon she started having some shortness of breath with tightness in the center of her chest that spread to her left chest, into her left neck, into her left shoulder and into her back. She states she had 2 episodes that were about 30 minutes apart. Each episode lasted about 5-6 minutes. During these episodes nothing she did made the pain worse, nothing she did make the pain feel better or go away. She states she had a third episode that lasted about 45 minutes that went away after she arrived to the ED. She states she's never had this discomfort before. She has had PVCs off and on for about 6 years and did have some palpitations today. Her husband states she was clammy. She denies nausea or vomiting. She has chronic swelling of her left leg. 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 She does not have pain in her legs. She has never had DVT.  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 She takes Zyrtec and Flonase when necessary allergy symptoms.  FH Sister alive at 31 has smoking hx, COPD, MI, Pacemaker, HTN, AODM Brother alive 85 has had MI and CAD Mother 51 alzheimers, multiple myeloma No DVT or PE  PCP Dr Wolfgang Phoenix  Past Medical History  Diagnosis Date  . PONV (postoperative nausea and vomiting)   . Family history of anesthesia complication 25 yrs ago    sister allergic to sccinocholine.. unable to come off vent. /or be extubated post op   . Dysrhythmia     pvc s from caffiene.  and allergy medicines.   Marland Kitchen GERD (gastroesophageal reflux disease)     occasionally  from spicy foods.    Past Surgical  History  Procedure Laterality Date  . Cervical disc surgery  1992    Fusion  . Salpingoophorectomy  1985    left .   . Knee arthroscopy  09/16/2012    Procedure: ARTHROSCOPY KNEE;  Surgeon: [name], MD;  Location: [facility];  Service: Orthopedics;  Laterality: Right;  Right Knee Arthroscopy  . Carpal tunnel release     No family history on file. History  Substance Use Topics  . Smoking status: Former Research scientist (life sciences)  . Smokeless tobacco: Former Systems developer    Quit date: 01/12/1997  . Alcohol Use: No   Employed Lives at home Lives with spouse  OB History    No data available     Review of Systems  All other systems reviewed and are negative.     Allergies  Penicillins; Shellfish allergy; Morphine and related; and Codeine  Home Medications   Prior to Admission medications   Medication Sig Start Date End Date Taking? Authorizing Provider  Calcium Carbonate-Vit D-Min (GNP CALCIUM PLUS 600 +D PO) Take 1 tablet by mouth 2 (two) times daily.   Yes Historical Provider, MD  cetirizine (ZYRTEC) 10 MG tablet Take 10 mg by mouth daily as needed for allergies.    Yes Historical Provider, MD  estradiol (ESTRACE) 0.5 MG tablet Take 0.5 mg by mouth daily.   Yes Historical Provider, MD  fluticasone (FLONASE) 50 MCG/ACT nasal  spray Place 2 sprays into both nostrils daily as needed for allergies or rhinitis.   Yes Historical Provider, MD  Multiple Vitamins-Minerals (MULTIVITAMINS THER. W/MINERALS) TABS Take 1 tablet by mouth daily.   Yes Historical Provider, MD  fluticasone (FLONASE) 50 MCG/ACT nasal spray Place 2 sprays into both nostrils daily. For sinus pressure Patient not taking: Reported on 09/04/2014 12/28/13   Mikey Kirschner, MD  ibuprofen (ADVIL,MOTRIN) 800 MG tablet Take 1 tablet (800 mg total) by mouth every 6 (six) hours as needed. 12/28/13   Mikey Kirschner, MD   BP 143/73 mmHg  Pulse 73  Resp 20  Ht 5' 5" (1.651 m)  Wt 215 lb 5 oz (97.665 kg)  BMI 35.83 kg/m2  SpO2 94%   Vital  signs normal    Physical Exam  Constitutional: She is oriented to person, place, and time. She appears well-developed and well-nourished.  Non-toxic appearance. She does not appear ill. No distress.  HENT:  Head: Normocephalic and atraumatic.  Right Ear: External ear normal.  Left Ear: External ear normal.  Nose: Nose normal. No mucosal edema or rhinorrhea.  Mouth/Throat: Oropharynx is clear and moist and mucous membranes are normal. No dental abscesses or uvula swelling.  Eyes: Conjunctivae and EOM are normal. Pupils are equal, round, and reactive to light.  Neck: Normal range of motion and full passive range of motion without pain. Neck supple.  Cardiovascular: Normal rate, regular rhythm and normal heart sounds.  Exam reveals no gallop and no friction rub.   No murmur heard. Pulmonary/Chest: Effort normal and breath sounds normal. No respiratory distress. She has no wheezes. She has no rhonchi. She has no rales. She exhibits no tenderness and no crepitus.  Abdominal: Soft. Normal appearance and bowel sounds are normal. She exhibits no distension. There is no tenderness. There is no rebound and no guarding.  Musculoskeletal: Normal range of motion. She exhibits no edema or tenderness.  Moves all extremities well.   Neurological: She is alert and oriented to person, place, and time. She has normal strength. No cranial nerve deficit.  Skin: Skin is warm, dry and intact. No rash noted. No erythema. No pallor.  Psychiatric: She has a normal mood and affect. Her speech is normal and behavior is normal. Her mood appears not anxious.  Nursing note and vitals reviewed.   ED Course  Procedures (including critical care time)  Medications  aspirin EC tablet 325 mg (325 mg Oral Given 09/04/14 1802)  nitroGLYCERIN (NITROGLYN) 2 % ointment 1 inch (1 inch Topical Given 09/04/14 1802)   Heart score is 2-3. Pt given aspirin and NTG ointment was placed. We discussed admission.   19:00 Pt has decided  she doesn't want to be admitted. Will get a 3 hour troponin and if negative she wants to go home and follow up with cardiology this week. It is Saturday and she would be unable to get a stress test or echocardiogram until Monday.    Labs Review Results for orders placed or performed during the hospital encounter of 09/04/14  CBC with Differential  Result Value Ref Range   WBC 9.1 4.0 - 10.5 K/uL   RBC 4.48 3.87 - 5.11 MIL/uL   Hemoglobin 14.5 12.0 - 15.0 g/dL   HCT 42.3 36.0 - 46.0 %   MCV 94.4 78.0 - 100.0 fL   MCH 32.4 26.0 - 34.0 pg   MCHC 34.3 30.0 - 36.0 g/dL   RDW 12.2 11.5 - 15.5 %   Platelets 259  150 - 400 K/uL   Neutrophils Relative % 71 43 - 77 %   Neutro Abs 6.4 1.7 - 7.7 K/uL   Lymphocytes Relative 21 12 - 46 %   Lymphs Abs 1.9 0.7 - 4.0 K/uL   Monocytes Relative 7 3 - 12 %   Monocytes Absolute 0.7 0.1 - 1.0 K/uL   Eosinophils Relative 1 0 - 5 %   Eosinophils Absolute 0.1 0.0 - 0.7 K/uL   Basophils Relative 0 0 - 1 %   Basophils Absolute 0.0 0.0 - 0.1 K/uL  Basic metabolic panel  Result Value Ref Range   Sodium 146 137 - 147 mEq/L   Potassium 3.5 (L) 3.7 - 5.3 mEq/L   Chloride 106 96 - 112 mEq/L   CO2 28 19 - 32 mEq/L   Glucose, Bld 102 (H) 70 - 99 mg/dL   BUN 25 (H) 6 - 23 mg/dL   Creatinine, Ser 1.07 0.50 - 1.10 mg/dL   Calcium 9.8 8.4 - 10.5 mg/dL   GFR calc non Af Amer 55 (L) >90 mL/min   GFR calc Af Amer 64 (L) >90 mL/min   Anion gap 12 5 - 15  Troponin I  Result Value Ref Range   Troponin I <0.30 <0.30 ng/mL  Magnesium  Result Value Ref Range   Magnesium 2.2 1.5 - 2.5 mg/dL  D-dimer, quantitative  Result Value Ref Range   D-Dimer, Quant <0.27 0.00 - 0.48 ug/mL-FEU  APTT  Result Value Ref Range   aPTT 36 24 - 37 seconds  Protime-INR  Result Value Ref Range   Prothrombin Time 12.9 11.6 - 15.2 seconds   INR 0.96 0.00 - 1.49  Troponin I  Result Value Ref Range   Troponin I <0.30 <0.30 ng/mL     Laboratory interpretation all normal except mild  renal insufficiency   Imaging Review Dg Chest Portable 1 View  09/04/2014   CLINICAL DATA:  Chest pain  EXAM: PORTABLE CHEST - 1 VIEW  COMPARISON:  None.  FINDINGS: Normal mediastinum and cardiac silhouette. Normal pulmonary vasculature. No evidence of effusion, infiltrate, or pneumothorax. No acute bony abnormality.  IMPRESSION: No acute cardiopulmonary process.   Electronically Signed   By: Suzy Bouchard M.D.   On: 09/04/2014 17:28     EKG Interpretation   Date/Time:  Saturday September 04 2014 17:00:21 EST Ventricular Rate:  76 PR Interval:  135 QRS Duration: 158 QT Interval:  434 QTC Calculation: 488 R Axis:   88 Text Interpretation:  Sinus rhythm Consider right atrial enlargement Right  bundle branch block No old tracing to compare Confirmed by Padraic Marinos  MD-I,  Bettyanne Dittman ([id number]) on 09/04/2014 5:56:04 PM      MDM   Final diagnoses:  Chest pain, unspecified chest pain type     Discharge Medication List as of 09/04/2014  9:09 PM    START taking these medications   Details  nitroGLYCERIN (NITROSTAT) 0.4 MG SL tablet Place 1 tablet (0.4 mg total) under the tongue every 5 (five) minutes as needed for chest pain., Starting 09/04/2014, Until Discontinued, Print        Plan discharge  Rolland Porter, MD, Alanson Aly, MD 09/04/14 2205

## 2014-09-04 NOTE — Discharge Instructions (Signed)
Rest this weekend. Use the Nitroglycerin tablets under your tongue if you get the pain again. Sit down when you take the nitroglycerin or you may get dizzy or pass out. You should return to the ED if you start getting the chest pains again.  Take 1 aspirin 81 mg a day. Call the Cardiologists office on Monday, November 9th to be seen this week.  With your family history of heart problems and the way you describe the chest pain, you need to be evaluated soon.    Aspirin and Your Heart Aspirin affects the way your blood clots and helps "thin" the blood. Aspirin has many uses in heart disease. It may be used as a primary prevention to help reduce the risk of heart related events. It also can be used as a secondary measure to prevent more heart attacks or to prevent additional damage from blood clots.  ASPIRIN MAY HELP IF YOU:  Have had a heart attack or chest pain.  Have undergone open heart surgery such as CABG (Coronary Artery Bypass Surgery).  Have had coronary angioplasty with or without stents.  Have experienced a stroke or TIA (transient ischemic attack).  Have peripheral vascular disease (PAD).  Have chronic heart rhythm problems such as atrial fibrillation.  Are at risk for heart disease. BEFORE STARTING ASPIRIN Before you start taking aspirin, your caregiver will need to review your medical history. Many things will need to be taken into consideration, such as:  Smoking status.  Blood pressure.  Diabetes.  Gender.  Weight.  Cholesterol level. ASPIRIN DOSES  Aspirin should only be taken on the advice of your caregiver. Talk to your caregiver about how much aspirin you should take. Aspirin comes in different doses such as:  81 mg.  162 mg.  325 mg.  The aspirin dose you take may be affected by many factors, some of which include:  Your current medications, especially if your are taking blood-thinners or anti-platelet medicine.  Liver function.  Heart disease  risk.  Age.  Aspirin comes in two forms:  Non-enteric-coated. This type of aspirin does not have a coating and is absorbed faster. Non-enteric coated aspirin is recommended for patients experiencing chest pain symptoms. This type of aspirin also comes in a chewable form.  Enteric-coated. This means the aspirin has a special coating that releases the medicine very slowly. Enteric-coated aspirin causes less stomach upset. This type of aspirin should not be chewed or crushed. ASPIRIN SIDE EFFECTS Daily use of aspirin can increase your risk of serious side effects. Some of these include:  Increased bleeding. This can range from a cut that does not stop bleeding to more serious problems such as stomach bleeding or bleeding into the brain (Intracerebral bleeding).  Increased bruising.  Stomach upset.  An allergic reaction such as red, itchy skin.  Increased risk of bleeding when combined with non-steroidal anti-inflammatory medicine (NSAIDS).  Alcohol should be drank in moderation when taking aspirin. Alcohol can increase the risk of stomach bleeding when taken with aspirin.  Aspirin should not be given to children less than 58 years of age due to the association of Reye syndrome. Reye syndrome is a serious illness that can affect the brain and liver. Studies have linked Reye syndrome with aspirin use in children.  People that have nasal polyps have an increased risk of developing an aspirin allergy. SEEK MEDICAL CARE IF:   You develop an allergic reaction such as:  Hives.  Itchy skin.  Swelling of the lips, tongue  or face.  You develop stomach pain.  You have unusual bleeding or bruising.  You have ringing in your ears. SEEK IMMEDIATE MEDICAL CARE IF:   You have severe chest pain, especially if the pain is crushing or pressure-like and spreads to the arms, back, neck, or jaw. THIS IS AN EMERGENCY. Do not wait to see if the pain will go away. Get medical help at once. Call your  local emergency services (911 in the [place] DO NOT drive yourself to the hospital.  You have stroke-like symptoms such as:  Loss of vision.  Difficulty talking.  Numbness or weakness on one side of your body.  Numbness or weakness in your arm or leg.  Not thinking clearly or feeling confused.  Your bowel movements are bloody, dark red or black in color.  You vomit or cough up blood.  You have blood in your urine.  You have shortness of breath, coughing or wheezing. MAKE SURE YOU:   Understand these instructions.  Will monitor your condition.  Seek immediate medical care if necessary. Document Released: 09/27/2008 Document Revised: 02/09/2013 Document Reviewed: 01/20/2014 [facility] Patient Information 2015 [place], [place]. This information is not intended to replace advice given to you by your health care provider. Make sure you discuss any questions you have with your health care provider.

## 2014-09-07 ENCOUNTER — Ambulatory Visit (INDEPENDENT_AMBULATORY_CARE_PROVIDER_SITE_OTHER): Payer: 59 | Admitting: Cardiology

## 2014-09-07 ENCOUNTER — Encounter: Payer: Self-pay | Admitting: Cardiology

## 2014-09-07 VITALS — BP 142/82 | HR 72 | Ht 66.0 in | Wt 219.0 lb

## 2014-09-07 DIAGNOSIS — R072 Precordial pain: Secondary | ICD-10-CM | POA: Insufficient documentation

## 2014-09-07 DIAGNOSIS — I493 Ventricular premature depolarization: Secondary | ICD-10-CM

## 2014-09-07 NOTE — Progress Notes (Signed)
Reason for visit: Chest pain  Clinical Summary Jennifer Coleman is a 63 y.o.female referred for cardiology consultation by Dr. Tomi Coleman after recent ER visit. Primary care physician is Dr. Wolfgang Coleman. She states that over the last few weeks she has been experiencing recurring chest discomfort described as a tightness, usually central or upper chest, some radiation into the left shoulder. She also feels short of breath when these episodes occur. This has not been associated with any sense of palpitations, not similar to prior PVCs. There is no reproducible exertional component. She does admit to being under a lot of emotional stress with work and family.  Recent ECG reviewed showing sinus rhythm with RBBB. CXR from 11/7 showed no acute cardiopulmonary process. Troponin I and d-dimer were negative. Additional lab work reviewed, hemoglobin 14.1, platelets 265, potassium 3.5, BUN 25, creatinine 1.0.  She has not undergone any prior ischemic cardiovascular sting. There is family history of CAD in siblings.   Allergies  Allergen Reactions  . Penicillins Shortness Of Breath, Swelling and Rash  . Shellfish Allergy Shortness Of Breath and Swelling  . Morphine And Related Nausea And Vomiting  . Codeine Itching    Current Outpatient Prescriptions  Medication Sig Dispense Refill  . aspirin 81 MG tablet Take 81 mg by mouth daily.    . Calcium Carbonate-Vit D-Min (GNP CALCIUM PLUS 600 +D PO) Take 1 tablet by mouth 2 (two) times daily.    . cetirizine (ZYRTEC) 10 MG tablet Take 10 mg by mouth daily as needed for allergies.     Marland Kitchen estradiol (ESTRACE) 0.5 MG tablet Take 0.5 mg by mouth daily.    . fluticasone (FLONASE) 50 MCG/ACT nasal spray Place 2 sprays into both nostrils daily. For sinus pressure 16 g 2  . ibuprofen (ADVIL,MOTRIN) 800 MG tablet Take 800 mg by mouth every 8 (eight) hours as needed.   2  . Multiple Vitamins-Minerals (MULTIVITAMINS THER. W/MINERALS) TABS Take 1 tablet by mouth daily.    .  nitroGLYCERIN (NITROSTAT) 0.4 MG SL tablet Place 1 tablet (0.4 mg total) under the tongue every 5 (five) minutes as needed for chest pain. 30 tablet 0   No current facility-administered medications for this visit.    Past Medical History  Diagnosis Date  . Family history of anesthesia complication 25 yrs ago    Sister allergic to sccinocholine - unable to come off ventilator  . PVC's (premature ventricular contractions)     Caffiene, allergy medicines  . GERD (gastroesophageal reflux disease)   . Shingles     Past Surgical History  Procedure Laterality Date  . Cervical disc surgery  1992    Fusion  . Salpingoophorectomy Left 1985  . Knee arthroscopy  09/16/2012    Procedure: ARTHROSCOPY KNEE;  Surgeon: Jennifer Balding, MD;  Location: [facility];  Service: Orthopedics;  Laterality: Right;  Right Knee Arthroscopy  . Carpal tunnel release      Family History  Problem Relation Age of Onset  . Heart disease Sister   . Heart disease Brother     23's  . Diabetes Mellitus II Sister   . Multiple myeloma Mother   . Hypertension Sister     Social History [name] that she has quit smoking. Her smoking use included Cigarettes. She smoked 0.00 packs per day. She quit smokeless tobacco use about 17 years ago. [name] reports that she does not drink alcohol.  Review of Systems Complete review of systems negative except as otherwise outlined in  the clinical summary and also the following.no recent problems palpitations, no syncope. No recent cough, fevers or chills. No orthopnea or PND. No claudication. Some leg stiffness when she sits for long periods of time.  Physical Examination Filed Vitals:   09/07/14 1341  BP: 142/82  Pulse: 72   Filed Weights   09/07/14 1315  Weight: 219 lb (99.338 kg)   Appears comfortable at rest. HEENT: Conjunctiva and lids normal, oropharynx clear. Neck: Supple, no elevated JVP or carotid bruits, no thyromegaly. Lungs: Clear to auscultation,  nonlabored breathing at rest. Cardiac: Regular rate and rhythm, no S3 or significant systolic murmur, no pericardial rub. Abdomen: Soft, nontender, bowel sounds present. Extremities: No pitting edema, distal pulses 2+. Skin: Warm and dry. Musculoskeletal: No kyphosis. Neuropsychiatric: Alert and oriented x3, affect grossly appropriate.   Problem List and Plan   Precordial pain As outlined above, features both typical and atypical for angina. Baseline ECG shows no acute ST segment changes, cardiac markers negative during recent ER visit, chest x-ray without acute findings. No significant murmurs on examination. She does have family history of CAD in her siblings, blood pressure elevated but no standing history of hypertension. She has not required lipid management over the years. Plan is to proceed with an exercise Cardiolite for objective ischemic evaluation and proceed from there in terms of recommendations.  PVCs (premature ventricular contractions) Documented in the past, prior history of palpitations. This has not been a recurring problem recently however.    Satira Sark, M.D., F.A.C.C.

## 2014-09-07 NOTE — Assessment & Plan Note (Signed)
As outlined above, features both typical and atypical for angina. Baseline ECG shows no acute ST segment changes, cardiac markers negative during recent ER visit, chest x-ray without acute findings. No significant murmurs on examination. She does have family history of CAD in her siblings, blood pressure elevated but no standing history of hypertension. She has not required lipid management over the years. Plan is to proceed with an exercise Cardiolite for objective ischemic evaluation and proceed from there in terms of recommendations.

## 2014-09-07 NOTE — Assessment & Plan Note (Signed)
Documented in the past, prior history of palpitations. This has not been a recurring problem recently however.

## 2014-09-07 NOTE — Patient Instructions (Signed)
Your physician recommends that you schedule a follow-up appointment in: we will call you with results    Your physician recommends that you continue on your current medications as directed. Please refer to the Current Medication list given to you to.    Your physician has requested that you have en exercise stress myoview. For further information please visit [email or web address]. Please follow instruction sheet, as given.      Thank you for choosing [facility] !

## 2014-09-08 ENCOUNTER — Encounter (HOSPITAL_COMMUNITY): Payer: Self-pay

## 2014-09-08 ENCOUNTER — Encounter (HOSPITAL_COMMUNITY)
Admission: RE | Admit: 2014-09-08 | Discharge: 2014-09-08 | Disposition: A | Discharge status: Home / Self Care | Payer: 59 | Source: Ambulatory Visit | Attending: Cardiology | Admitting: Cardiology

## 2014-09-08 ENCOUNTER — Telehealth: Payer: Self-pay | Admitting: *Deleted

## 2014-09-08 ENCOUNTER — Ambulatory Visit (HOSPITAL_COMMUNITY)
Admission: RE | Admit: 2014-09-08 | Discharge: 2014-09-08 | Disposition: A | Discharge status: Home / Self Care | Payer: 59 | Source: Ambulatory Visit | Attending: Cardiology | Admitting: Cardiology

## 2014-09-08 DIAGNOSIS — R072 Precordial pain: Secondary | ICD-10-CM | POA: Insufficient documentation

## 2014-09-08 DIAGNOSIS — R079 Chest pain, unspecified: Secondary | ICD-10-CM | POA: Insufficient documentation

## 2014-09-08 MED ORDER — TECHNETIUM TC 99M SESTAMIBI - CARDIOLITE
10.0000 | Freq: Once | INTRAVENOUS | Status: AC | PRN
  Administered 2014-09-08: 10 via INTRAVENOUS

## 2014-09-08 MED ORDER — TECHNETIUM TC 99M SESTAMIBI GENERIC - CARDIOLITE
30.0000 | Freq: Once | INTRAVENOUS | Status: AC | PRN
  Administered 2014-09-08: 30 via INTRAVENOUS

## 2014-09-08 MED ORDER — SODIUM CHLORIDE 0.9 % IJ SOLN
INTRAMUSCULAR | Status: AC
  Administered 2014-09-08: 10 mL via INTRAVENOUS
  Filled 2014-09-08: qty 10

## 2014-09-08 MED ORDER — SODIUM CHLORIDE 0.9 % IJ SOLN
10.0000 mL | INTRAMUSCULAR | Status: DC | PRN
  Administered 2014-09-08: 10 mL via INTRAVENOUS
  Filled 2014-09-08: qty 10

## 2014-09-08 NOTE — Telephone Encounter (Signed)
Pt made aware, forwarded to Dr. Luking 

## 2014-09-08 NOTE — Telephone Encounter (Signed)
-----   Message from Satira Sark, MD sent at 09/08/2014 12:02 PM EST ----- Reviewed. This test was low risk for cardiac events, but does not completely exclude the possibility of CAD however. Although reassuring, would say that if she continues to have chest pain symptoms, we will need to discuss further evaluation with heart catheterization.

## 2014-09-08 NOTE — Progress Notes (Signed)
Stress Lab Nurses Notes - Jennifer Coleman 09/08/2014 Reason for doing test: Chest Pain Type of test: Stress Cardiolite Nurse performing test: Gerrit Halls, RN Nuclear Medicine Tech: Melburn Hake Echo Tech: Not Applicable MD performing test: S. McDowell/K.Purcell Nails NP Family MD: Mickie Hillier Test explained and consent signed: Yes.   IV started: Saline lock flushed, No redness or edema and Saline lock started in radiology Symptoms: Fatigue in legs & flushed in the face Treatment/Intervention: None Reason test stopped: fatigue After recovery IV was: Discontinued via X-ray tech and No redness or edema Patient to return to Nuc. Med at : 10:15 Patient discharged: Home Patient's Condition upon discharge was: stable Comments: During test peak BP 206/68 & HR 146.  Recovery BP 156/82 & HR 88.  Symptoms resolved in recovery.  [name] T

## 2014-09-09 ENCOUNTER — Encounter: Payer: Self-pay | Admitting: Family Medicine

## 2014-09-09 ENCOUNTER — Ambulatory Visit (INDEPENDENT_AMBULATORY_CARE_PROVIDER_SITE_OTHER): Payer: 59 | Admitting: Family Medicine

## 2014-09-09 VITALS — BP 152/96 | Ht 66.0 in | Wt 218.2 lb

## 2014-09-09 DIAGNOSIS — I10 Essential (primary) hypertension: Secondary | ICD-10-CM

## 2014-09-09 MED ORDER — ENALAPRIL MALEATE 5 MG PO TABS: 5.0000 mg | ORAL_TABLET | Freq: Every day | ORAL | Status: DC

## 2014-09-09 MED ORDER — ESCITALOPRAM OXALATE 10 MG PO TABS: 10.0000 mg | ORAL_TABLET | Freq: Every day | ORAL | Status: DC

## 2014-09-09 NOTE — Progress Notes (Signed)
   Subjective:    Patient ID: Jennifer Coleman, female    DOB: 04-Jun-1951, 63 y.o.   MRN: [id number]  HPI Patient arrives for a follow up from the ER for chest pain and elevated blood pressure. Patient has seen Cardiology and had stress test yesterday but still having elevated blood pressure.  Not exdecising  Very stressful job. Frequently feels uptight.   strong fam hw of htn all of siblings have elevated blood pressure. Review of chart in front of patient reveals her blood pressures been good here up until this past year. Starting to inch up somewhat.  Patient very nervous and anxious about her elevated blood pressure.  Admits to not the best dietary compliance.  All hospital notes reviewed and front of patient. Stress test still pending.  Review of Systems    no headache no shortness breath no weight loss no weight gain no change in bowel habits no blood in stool no rash ROS otherwise negative Objective:   Physical Exam Alert blood pressure 138/92 on repeat both arms. Very similar. Lungs clear. Heart regular in rhythm. H&T normal. Ankles without edema.     Assessment & Plan:  Impression hypertension discussed at great length. #2 recent chest pain likely stress-related not likely cardiac. Still await stress test. Plan initiate medication as discussed. Diet exercise discussed. Educational information. Follow-up as scheduled. WSL

## 2014-09-12 DIAGNOSIS — I10 Essential (primary) hypertension: Secondary | ICD-10-CM | POA: Insufficient documentation

## 2015-02-11 ENCOUNTER — Other Ambulatory Visit: Payer: Self-pay | Admitting: *Deleted

## 2015-02-11 MED ORDER — ENALAPRIL MALEATE 5 MG PO TABS: 5.0000 mg | ORAL_TABLET | Freq: Every day | ORAL | Status: DC

## 2015-02-11 MED ORDER — ESCITALOPRAM OXALATE 10 MG PO TABS: 10.0000 mg | ORAL_TABLET | Freq: Every day | ORAL | Status: DC

## 2015-02-24 ENCOUNTER — Telehealth: Payer: Self-pay | Admitting: *Deleted

## 2015-02-24 MED ORDER — DOXYCYCLINE HYCLATE 100 MG PO TABS: 100.0000 mg | ORAL_TABLET | Freq: Two times a day (BID) | ORAL | Status: DC

## 2015-02-24 NOTE — Telephone Encounter (Signed)
Rx sent electronically to pharmacy. Patient notified and verbalized understanding.

## 2015-02-24 NOTE — Telephone Encounter (Signed)
See other phone message 02/24/15

## 2015-02-24 NOTE — Addendum Note (Signed)
Addended by: Dairl Ponder on: 02/24/2015 10:24 AM   Modules accepted: Orders

## 2015-02-24 NOTE — Telephone Encounter (Signed)
May go ahead and prescribed doxycycline 100 mg twice a day with snack tall glass of water. #20. Avoid excessive sunlight. Keep office visit on Tuesday. If progressive symptoms fever worse be seen here or ER

## 2015-02-24 NOTE — Telephone Encounter (Signed)
Patient having sinus symptoms for several weeks-has office visit Tues for blood pressure check and can not miss 2 days of work to come in today and would like antibiotic sent into [place] in [place]. Patient states she will keep her appt on Tues and seek medicare if worse.

## 2015-03-01 ENCOUNTER — Ambulatory Visit (INDEPENDENT_AMBULATORY_CARE_PROVIDER_SITE_OTHER): Payer: 59 | Admitting: Family Medicine

## 2015-03-01 ENCOUNTER — Encounter: Payer: Self-pay | Admitting: Family Medicine

## 2015-03-01 VITALS — BP 138/88 | Temp 98.6°F | Ht 66.0 in | Wt 218.0 lb

## 2015-03-01 DIAGNOSIS — I10 Essential (primary) hypertension: Secondary | ICD-10-CM | POA: Diagnosis not present

## 2015-03-01 DIAGNOSIS — Z13220 Encounter for screening for lipoid disorders: Secondary | ICD-10-CM

## 2015-03-01 DIAGNOSIS — R5383 Other fatigue: Secondary | ICD-10-CM

## 2015-03-01 DIAGNOSIS — Z79899 Other long term (current) drug therapy: Secondary | ICD-10-CM

## 2015-03-01 MED ORDER — ESCITALOPRAM OXALATE 20 MG PO TABS: 20.0000 mg | ORAL_TABLET | Freq: Every day | ORAL | Status: DC

## 2015-03-01 MED ORDER — ENALAPRIL MALEATE 10 MG PO TABS: 10.0000 mg | ORAL_TABLET | Freq: Every day | ORAL | Status: DC

## 2015-03-01 NOTE — Progress Notes (Signed)
   Subjective:    Patient ID: Jennifer Coleman, female    DOB: 14-Dec-1950, 64 y.o.   MRN: [id number]  Hypertension This is a chronic problem. The current episode started more than 1 year ago. Compliance problems include exercise.    Requesting refill on flonase. Having sinus symptoms. Started doxy last week. Ear pain and sinus pressure.   Fatigue started about 2 months ago.  Tired and exhausted allthe time.  Shortness of breath. Chest pressure, upper abdomen and radiating into the chest.  Substernal disconmfort transient and achey. Patient had stress test just within the past 6 months. Within normal limits.  Compliant with blood pressure medicine. No obvious side effects. Watching salt intake. Blood pressures often elevated elsewhere.   or the past 2 months.   Stopped hormones several weeks ago, seems to have made fatigue worse.  Taking the hormones qod despite gyn recommending pt stop med  Review of Systems Positive fatigue no headache no change in bowel habits no blood in stool no rash ROS otherwise negative    Objective:   Physical Exam  Alert somewhat mild malaise. Vitals stable blood pressure elevated on repeat HEENT normal. Lungs clear. Heart regular in rhythm. Ankles without edema.      Assessment & Plan:  Impression #1 hypertension suboptimal control #2 fatigue discussed likely multi-factorial #3 hypoestrogenism patient having challenge weaning off medication. #4 transient chest symptoms doubt cardiac with recent negative stress test and very atypical nature of symptoms #5 chronic stress depression anxiety discussed plan diet exercise strongly discussed in encourage. Appropriate blood work to look of fatigue. Increased blood pressure medicine follow-up in 6 weeks WSL

## 2015-03-03 ENCOUNTER — Other Ambulatory Visit: Payer: Self-pay | Admitting: *Deleted

## 2015-03-03 ENCOUNTER — Telehealth: Payer: Self-pay | Admitting: Family Medicine

## 2015-03-03 MED ORDER — FLUTICASONE PROPIONATE 50 MCG/ACT NA SUSP: 2.0000 | Freq: Every day | NASAL | Status: DC

## 2015-03-03 MED ORDER — IBUPROFEN 800 MG PO TABS: 800.0000 mg | ORAL_TABLET | Freq: Three times a day (TID) | ORAL | Status: DC | PRN

## 2015-03-03 MED ORDER — IBUPROFEN 800 MG PO TABS: 800.0000 mg | ORAL_TABLET | Freq: Four times a day (QID) | ORAL | Status: DC | PRN

## 2015-03-03 NOTE — Telephone Encounter (Signed)
Patient needs refill on flonase,800 Ibuprofen called into [facility] . She was just in for appointment.

## 2015-03-03 NOTE — Telephone Encounter (Signed)
meds sent to pharm. Pt notified.  

## 2015-03-03 NOTE — Telephone Encounter (Signed)
Ok six ref 

## 2015-03-05 LAB — LIPID PANEL
Chol/HDL Ratio: 2.3 ratio units (ref 0.0–4.4)
Cholesterol, Total: 225 mg/dL — ABNORMAL HIGH (ref 100–199)
HDL: 98 mg/dL (ref >39)
LDL Calculated: 116 mg/dL — ABNORMAL HIGH (ref 0–99)
Triglycerides: 54 mg/dL (ref 0–149)
VLDL CHOLESTEROL CAL: 11 mg/dL (ref 5–40)

## 2015-03-05 LAB — CBC WITH DIFFERENTIAL/PLATELET
Basophils Absolute: 0 10*3/uL (ref 0.0–0.2)
Basos: 0 %
EOS (ABSOLUTE): 0.1 10*3/uL (ref 0.0–0.4)
Eos: 1 %
HEMATOCRIT: 40.8 % (ref 34.0–46.6)
Hemoglobin: 13.9 g/dL (ref 11.1–15.9)
Immature Grans (Abs): 0 10*3/uL (ref 0.0–0.1)
Immature Granulocytes: 0 %
Lymphocytes Absolute: 2.3 10*3/uL (ref 0.7–3.1)
Lymphs: 34 %
MCH: 32 pg (ref 26.6–33.0)
MCHC: 34.1 g/dL (ref 31.5–35.7)
MCV: 94 fL (ref 79–97)
MONOS ABS: 0.5 10*3/uL (ref 0.1–0.9)
Monocytes: 8 %
Neutrophils Absolute: 3.9 10*3/uL (ref 1.4–7.0)
Neutrophils: 57 %
Platelets: 259 10*3/uL (ref 150–379)
RBC: 4.35 x10E6/uL (ref 3.77–5.28)
RDW: 13.1 % (ref 12.3–15.4)
WBC: 6.8 10*3/uL (ref 3.4–10.8)

## 2015-03-05 LAB — HEPATIC FUNCTION PANEL
ALT: 16 IU/L (ref 0–32)
AST: 23 IU/L (ref 0–40)
Albumin: 4.3 g/dL (ref 3.6–4.8)
Alkaline Phosphatase: 82 IU/L (ref 39–117)
Bilirubin Total: 0.4 mg/dL (ref 0.0–1.2)
Bilirubin, Direct: 0.14 mg/dL (ref 0.00–0.40)
TOTAL PROTEIN: 6.4 g/dL (ref 6.0–8.5)

## 2015-03-05 LAB — TSH: TSH: 2.41 u[IU]/mL (ref 0.450–4.500)

## 2015-03-05 LAB — BASIC METABOLIC PANEL
BUN/Creatinine Ratio: 31 — ABNORMAL HIGH (ref 11–26)
BUN: 26 mg/dL (ref 8–27)
CALCIUM: 9.7 mg/dL (ref 8.7–10.3)
CO2: 26 mmol/L (ref 18–29)
CREATININE: 0.84 mg/dL (ref 0.57–1.00)
Chloride: 104 mmol/L (ref 97–108)
GFR, EST AFRICAN AMERICAN: 87 mL/min/{1.73_m2} (ref >59)
GFR, EST NON AFRICAN AMERICAN: 75 mL/min/{1.73_m2} (ref >59)
GLUCOSE: 79 mg/dL (ref 65–99)
POTASSIUM: 4.1 mmol/L (ref 3.5–5.2)
Sodium: 143 mmol/L (ref 134–144)

## 2015-03-09 ENCOUNTER — Encounter: Payer: Self-pay | Admitting: Family Medicine

## 2015-03-18 ENCOUNTER — Other Ambulatory Visit: Payer: Self-pay | Admitting: *Deleted

## 2015-03-18 ENCOUNTER — Telehealth: Payer: Self-pay | Admitting: Family Medicine

## 2015-03-18 MED ORDER — LEVOFLOXACIN 500 MG PO TABS: 500.0000 mg | ORAL_TABLET | Freq: Every day | ORAL | Status: DC

## 2015-03-18 NOTE — Telephone Encounter (Signed)
Med sent to pham. Pt notified.

## 2015-03-18 NOTE — Telephone Encounter (Signed)
Pt is requesting a refill on the antibiotic she was prescribed recently. Pt states she is congested and has a sore throat and cough.   [name]

## 2015-03-18 NOTE — Telephone Encounter (Signed)
Lev 500 qd ten d

## 2015-03-18 NOTE — Telephone Encounter (Signed)
Pt called on 4/28 to get antibiotic. Doxy was sent in for her. She had appt for check up on 5/3. Pt states she got better on doxy but symptoms came back this week. Low grade fever mon and tues. 100.4. Sinus pressure and drainage - yellow. No wheezing. Sore throat, chest congestion, cough. Taking nyquil. Can something be called in or does she need a recheck in office today.

## 2015-04-11 ENCOUNTER — Encounter: Payer: Self-pay | Admitting: Family Medicine

## 2015-04-11 ENCOUNTER — Ambulatory Visit (INDEPENDENT_AMBULATORY_CARE_PROVIDER_SITE_OTHER): Payer: 59 | Admitting: Family Medicine

## 2015-04-11 VITALS — BP 122/88 | Ht 66.0 in | Wt 222.0 lb

## 2015-04-11 DIAGNOSIS — F329 Major depressive disorder, single episode, unspecified: Secondary | ICD-10-CM | POA: Diagnosis not present

## 2015-04-11 DIAGNOSIS — I878 Other specified disorders of veins: Secondary | ICD-10-CM

## 2015-04-11 DIAGNOSIS — J329 Chronic sinusitis, unspecified: Secondary | ICD-10-CM | POA: Diagnosis not present

## 2015-04-11 DIAGNOSIS — I10 Essential (primary) hypertension: Secondary | ICD-10-CM | POA: Diagnosis not present

## 2015-04-11 DIAGNOSIS — F32A Depression, unspecified: Secondary | ICD-10-CM

## 2015-04-11 MED ORDER — HYDROCHLOROTHIAZIDE 25 MG PO TABS: 25.0000 mg | ORAL_TABLET | Freq: Every day | ORAL | Status: DC

## 2015-04-11 MED ORDER — CEFDINIR 300 MG PO CAPS: 300.0000 mg | ORAL_CAPSULE | Freq: Two times a day (BID) | ORAL | Status: DC

## 2015-04-11 NOTE — Progress Notes (Signed)
   Subjective:    Patient ID: Jennifer Coleman, female    DOB: 07/12/1951, 64 y.o.   MRN: [id number]  Hypertension This is a chronic problem. Treatments tried: enalapril.  comp with the knew meds   Compliant with blood pressure medicine. No obvious side effect. Watching salt intake.   swelling in legs for the past 3 weeks. Notes intermittent swelling. Worse at the end of the day.  Sore throat and cough. Worse at nigh for the past 3 months. Has flonase, zyrtec,  nettie pot, and antibiotics doxy and levaquin. Productive gunky nose cough congestion  States overall mood has improved on medication. States overall seems to be helping no obvious side effects.   More prod cough and coung and gunkiness      Review of Systems No headache no chest pain and back pain abdominal pain no change in bowel habits no blood in stool    Objective:   Physical Exam  Alert vitals stable. HEENT moderate nasal congestion frontal turn his blood pressure good on repeat. Normal neck supple lungs clear. Heart rare rhythm abdomen benign ankles 1+ edema with venous stasis changes noted      Assessment & Plan:  Impression 1 venous stasis discussed at length #2 hypertension controlled improving #3 depression. #4 rhinosinusitis plan anti-bites prescribed. Add hydrochlorothiazide. Diet exercise discussed. Recheck as scheduled. WSL

## 2015-04-13 DIAGNOSIS — F329 Major depressive disorder, single episode, unspecified: Secondary | ICD-10-CM | POA: Insufficient documentation

## 2015-04-13 DIAGNOSIS — I878 Other specified disorders of veins: Secondary | ICD-10-CM | POA: Insufficient documentation

## 2015-04-13 DIAGNOSIS — F32A Depression, unspecified: Secondary | ICD-10-CM | POA: Insufficient documentation

## 2015-07-26 ENCOUNTER — Other Ambulatory Visit: Payer: Self-pay | Admitting: Physician Assistant

## 2015-08-11 ENCOUNTER — Encounter: Payer: Self-pay | Admitting: Family Medicine

## 2015-08-11 ENCOUNTER — Emergency Department (HOSPITAL_COMMUNITY): Payer: 59

## 2015-08-11 ENCOUNTER — Encounter (HOSPITAL_COMMUNITY): Payer: Self-pay | Admitting: *Deleted

## 2015-08-11 ENCOUNTER — Ambulatory Visit (INDEPENDENT_AMBULATORY_CARE_PROVIDER_SITE_OTHER): Payer: 59 | Admitting: Family Medicine

## 2015-08-11 ENCOUNTER — Emergency Department (HOSPITAL_COMMUNITY)
Admission: EM | Admit: 2015-08-11 | Discharge: 2015-08-11 | Disposition: A | Discharge status: Home / Self Care | Payer: 59 | Attending: Emergency Medicine | Admitting: Emergency Medicine

## 2015-08-11 VITALS — BP 150/90 | Ht 66.0 in | Wt 230.1 lb

## 2015-08-11 DIAGNOSIS — N289 Disorder of kidney and ureter, unspecified: Secondary | ICD-10-CM

## 2015-08-11 DIAGNOSIS — Z880 Allergy status to penicillin: Secondary | ICD-10-CM | POA: Diagnosis not present

## 2015-08-11 DIAGNOSIS — Z79899 Other long term (current) drug therapy: Secondary | ICD-10-CM | POA: Diagnosis not present

## 2015-08-11 DIAGNOSIS — F329 Major depressive disorder, single episode, unspecified: Secondary | ICD-10-CM | POA: Diagnosis not present

## 2015-08-11 DIAGNOSIS — Z793 Long term (current) use of hormonal contraceptives: Secondary | ICD-10-CM | POA: Insufficient documentation

## 2015-08-11 DIAGNOSIS — Z7951 Long term (current) use of inhaled steroids: Secondary | ICD-10-CM | POA: Diagnosis not present

## 2015-08-11 DIAGNOSIS — I10 Essential (primary) hypertension: Secondary | ICD-10-CM | POA: Insufficient documentation

## 2015-08-11 DIAGNOSIS — Z7982 Long term (current) use of aspirin: Secondary | ICD-10-CM | POA: Insufficient documentation

## 2015-08-11 DIAGNOSIS — Z9889 Other specified postprocedural states: Secondary | ICD-10-CM | POA: Insufficient documentation

## 2015-08-11 DIAGNOSIS — R079 Chest pain, unspecified: Secondary | ICD-10-CM | POA: Diagnosis not present

## 2015-08-11 DIAGNOSIS — Z87891 Personal history of nicotine dependence: Secondary | ICD-10-CM | POA: Diagnosis not present

## 2015-08-11 DIAGNOSIS — R109 Unspecified abdominal pain: Secondary | ICD-10-CM

## 2015-08-11 DIAGNOSIS — R0600 Dyspnea, unspecified: Secondary | ICD-10-CM

## 2015-08-11 DIAGNOSIS — R0789 Other chest pain: Secondary | ICD-10-CM | POA: Diagnosis not present

## 2015-08-11 DIAGNOSIS — R1013 Epigastric pain: Secondary | ICD-10-CM | POA: Insufficient documentation

## 2015-08-11 DIAGNOSIS — Z8619 Personal history of other infectious and parasitic diseases: Secondary | ICD-10-CM | POA: Diagnosis not present

## 2015-08-11 HISTORY — DX: Essential (primary) hypertension: I10

## 2015-08-11 LAB — CBC
HCT: 41.6 % (ref 36.0–46.0)
Hemoglobin: 14 g/dL (ref 12.0–15.0)
MCH: 32.3 pg (ref 26.0–34.0)
MCHC: 33.7 g/dL (ref 30.0–36.0)
MCV: 96.1 fL (ref 78.0–100.0)
PLATELETS: 187 10*3/uL (ref 150–400)
RBC: 4.33 MIL/uL (ref 3.87–5.11)
RDW: 12.3 % (ref 11.5–15.5)
WBC: 7.6 10*3/uL (ref 4.0–10.5)

## 2015-08-11 LAB — BASIC METABOLIC PANEL
Anion gap: 6 (ref 5–15)
BUN: 24 mg/dL — ABNORMAL HIGH (ref 6–20)
CALCIUM: 9.6 mg/dL (ref 8.9–10.3)
CHLORIDE: 106 mmol/L (ref 101–111)
CO2: 28 mmol/L (ref 22–32)
CREATININE: 1.16 mg/dL — AB (ref 0.44–1.00)
GFR calc non Af Amer: 49 mL/min — ABNORMAL LOW (ref >60)
GFR, EST AFRICAN AMERICAN: 57 mL/min — AB (ref >60)
Glucose, Bld: 97 mg/dL (ref 65–99)
Potassium: 4.1 mmol/L (ref 3.5–5.1)
SODIUM: 140 mmol/L (ref 135–145)

## 2015-08-11 LAB — HEPATIC FUNCTION PANEL
ALBUMIN: 3.8 g/dL (ref 3.5–5.0)
ALT: 14 U/L (ref 14–54)
AST: 23 U/L (ref 15–41)
Alkaline Phosphatase: 75 U/L (ref 38–126)
Bilirubin, Direct: <0.1 mg/dL — ABNORMAL LOW (ref 0.1–0.5)
TOTAL PROTEIN: 6.7 g/dL (ref 6.5–8.1)
Total Bilirubin: 0.4 mg/dL (ref 0.3–1.2)

## 2015-08-11 LAB — LIPASE, BLOOD: Lipase: 51 U/L (ref 22–51)

## 2015-08-11 LAB — TROPONIN I: Troponin I: <0.03 ng/mL (ref <0.031)

## 2015-08-11 MED ORDER — HYDROMORPHONE HCL 1 MG/ML IJ SOLN
0.5000 mg | Freq: Once | INTRAMUSCULAR | Status: AC
Start: 2015-08-11 — End: 2015-08-11
  Administered 2015-08-11: 0.5 mg via INTRAVENOUS
  Filled 2015-08-11: qty 1

## 2015-08-11 MED ORDER — HYDROCODONE-ACETAMINOPHEN 5-325 MG PO TABS: 1.0000 | ORAL_TABLET | Freq: Four times a day (QID) | ORAL | Status: DC | PRN

## 2015-08-11 MED ORDER — PANTOPRAZOLE SODIUM 20 MG PO TBEC: 20.0000 mg | DELAYED_RELEASE_TABLET | Freq: Every day | ORAL | Status: DC

## 2015-08-11 MED ORDER — HYDROMORPHONE HCL 1 MG/ML IJ SOLN
0.5000 mg | Freq: Once | INTRAMUSCULAR | Status: AC
  Administered 2015-08-11: 0.5 mg via INTRAVENOUS
  Filled 2015-08-11: qty 1

## 2015-08-11 MED ORDER — PANTOPRAZOLE SODIUM 40 MG IV SOLR
40.0000 mg | Freq: Once | INTRAVENOUS | Status: AC
  Administered 2015-08-11: 40 mg via INTRAVENOUS
  Filled 2015-08-11: qty 40

## 2015-08-11 MED ORDER — ONDANSETRON HCL 4 MG/2ML IJ SOLN
4.0000 mg | Freq: Once | INTRAMUSCULAR | Status: AC
  Administered 2015-08-11: 4 mg via INTRAVENOUS

## 2015-08-11 MED ORDER — LORAZEPAM 2 MG/ML IJ SOLN
0.5000 mg | Freq: Once | INTRAMUSCULAR | Status: AC
  Administered 2015-08-11: 0.5 mg via INTRAVENOUS
  Filled 2015-08-11: qty 1

## 2015-08-11 MED ORDER — GI COCKTAIL ~~LOC~~
30.0000 mL | Freq: Once | ORAL | Status: AC
  Administered 2015-08-11: 30 mL via ORAL
  Filled 2015-08-11: qty 30

## 2015-08-11 MED ORDER — ONDANSETRON HCL 4 MG/2ML IJ SOLN
INTRAMUSCULAR | Status: DC
Start: 2015-08-11 — End: 2015-08-12
  Filled 2015-08-11: qty 2

## 2015-08-11 MED ORDER — MORPHINE SULFATE (PF) 4 MG/ML IV SOLN: 4.0000 mg | Freq: Once | INTRAVENOUS | Status: DC

## 2015-08-11 NOTE — ED Provider Notes (Signed)
CSN: [id number]     Arrival date & time 08/11/15  1501 History   First MD Initiated Contact with Patient 08/11/15 1637     Chief Complaint  Patient presents with  . Chest Pain     (Consider location/radiation/quality/duration/timing/severity/associated sxs/prior Treatment) Patient is a 64 y.o. female presenting with abdominal pain. The history is provided by the patient (The patient complains of epigastric pain and chest pressure for the last couple days off and on. It is not affected by breathing or exertion).  Abdominal Pain Pain location:  Epigastric Pain quality: aching   Pain radiates to:  Chest Pain severity:  Moderate Onset quality:  Gradual Timing:  Intermittent Chronicity:  New Context: not alcohol use   Associated symptoms: chest pain   Associated symptoms: no cough, no diarrhea, no fatigue and no hematuria     Past Medical History  Diagnosis Date  . Family history of anesthesia complication 25 yrs ago    Sister allergic to sccinocholine - unable to come off ventilator  . PVC's (premature ventricular contractions)     Caffiene, allergy medicines  . Shingles   . Hypertension   . Depression    Past Surgical History  Procedure Laterality Date  . Cervical disc surgery  1992    Fusion  . Salpingoophorectomy Left 1985  . Knee arthroscopy  09/16/2012    Procedure: ARTHROSCOPY KNEE;  Surgeon: [name], MD;  Location: [employer] [facility];  Service: Orthopedics;  Laterality: Right;  Right Knee Arthroscopy  . Carpal tunnel release     Family History  Problem Relation Age of Onset  . Heart disease Sister   . Heart disease Brother     58's  . Diabetes Mellitus II Sister   . Multiple myeloma Mother   . Hypertension Sister    Social History  Substance Use Topics  . Smoking status: Former Smoker    Types: Cigarettes  . Smokeless tobacco: Former Systems developer    Quit date: 01/12/1997  . Alcohol Use: No   OB History    No data available     Review of Systems   Constitutional: Negative for appetite change and fatigue.  HENT: Negative for congestion, ear discharge and sinus pressure.   Eyes: Negative for discharge.  Respiratory: Negative for cough.   Cardiovascular: Positive for chest pain.  Gastrointestinal: Positive for abdominal pain. Negative for diarrhea.  Genitourinary: Negative for frequency and hematuria.  Musculoskeletal: Negative for back pain.  Skin: Negative for rash.  Neurological: Negative for seizures and headaches.  Psychiatric/Behavioral: Negative for hallucinations.      Allergies  Penicillins; Shellfish allergy; Morphine and related; and Codeine  Home Medications   Prior to Admission medications   Medication Sig Start Date End Date Taking? Authorizing Provider  aspirin EC 81 MG tablet Take 81 mg by mouth daily.   Yes Historical Provider, MD  Calcium Carbonate-Vit D-Min (GNP CALCIUM PLUS 600 +D PO) Take 1 tablet by mouth 2 (two) times daily.   Yes Historical Provider, MD  enalapril (VASOTEC) 10 MG tablet Take 1 tablet (10 mg total) by mouth daily. 03/01/15  Yes Mikey Kirschner, MD  escitalopram (LEXAPRO) 20 MG tablet Take 1 tablet (20 mg total) by mouth daily. 03/01/15 02/29/16 Yes Mikey Kirschner, MD  estradiol (ESTRACE) 0.5 MG tablet Take 0.5 mg by mouth daily.   Yes Historical Provider, MD  Multiple Vitamins-Minerals (MULTIVITAMINS THER. W/MINERALS) TABS Take 1 tablet by mouth daily.   Yes Historical Provider, MD  cefdinir (OMNICEF) 300  MG capsule Take 1 capsule (300 mg total) by mouth 2 (two) times daily. Patient not taking: Reported on 08/11/2015 04/11/15   Mikey Kirschner, MD  cetirizine (ZYRTEC) 10 MG tablet Take 10 mg by mouth daily as needed for allergies.     Historical Provider, MD  fluticasone (FLONASE) 50 MCG/ACT nasal spray Place 2 sprays into both nostrils daily. For sinus pressure 03/03/15   Mikey Kirschner, MD  HYDROcodone-acetaminophen (NORCO/VICODIN) 5-325 MG tablet Take 1 tablet by mouth every 6 (six) hours  as needed. 08/11/15   Milton Ferguson, MD  ibuprofen (ADVIL,MOTRIN) 800 MG tablet Take 1 tablet (800 mg total) by mouth every 6 (six) hours as needed. 03/03/15   Mikey Kirschner, MD  nitroGLYCERIN (NITROSTAT) 0.4 MG SL tablet Place 1 tablet (0.4 mg total) under the tongue every 5 (five) minutes as needed for chest pain. Patient not taking: Reported on 08/11/2015 09/04/14   Rolland Porter, MD  pantoprazole (PROTONIX) 20 MG tablet Take 1 tablet (20 mg total) by mouth daily. 08/11/15   Milton Ferguson, MD   BP 108/68 mmHg  Pulse 70  Temp(Src) 97.4 F (36.3 C) (Oral)  Resp 13  Ht _0  (1.676 m)  Wt 230 lb (104.327 kg)  BMI 37.14 kg/m2  SpO2 99% Physical Exam  Constitutional: She is oriented to person, place, and time. She appears well-developed.  HENT:  Head: Normocephalic.  Eyes: Conjunctivae and EOM are normal. No scleral icterus.  Neck: Neck supple. No thyromegaly present.  Cardiovascular: Normal rate and regular rhythm.  Exam reveals no gallop and no friction rub.   No murmur heard. Pulmonary/Chest: No stridor. She has no wheezes. She has no rales. She exhibits no tenderness.  Abdominal: She exhibits no distension. There is tenderness. There is no rebound.  Mild epigastric tenderness  Musculoskeletal: Normal range of motion. She exhibits no edema.  Lymphadenopathy:    She has no cervical adenopathy.  Neurological: She is oriented to person, place, and time. She exhibits normal muscle tone. Coordination normal.  Skin: No rash noted. No erythema.  Psychiatric: She has a normal mood and affect. Her behavior is normal.    ED Course  Procedures (including critical care time) Labs Review Labs Reviewed  BASIC METABOLIC PANEL - Abnormal; Notable for the following:    BUN 24 (*)    Creatinine, Ser 1.16 (*)    GFR calc non Af Amer 49 (*)    GFR calc Af Amer 57 (*)    All other components within normal limits  HEPATIC FUNCTION PANEL - Abnormal; Notable for the following:    Bilirubin, Direct  <0.1 (*)    All other components within normal limits  CBC  TROPONIN I  LIPASE, BLOOD    Imaging Review Dg Chest 2 View  08/11/2015  CLINICAL DATA:  Chest pain EXAM: CHEST  2 VIEW COMPARISON:  09/04/2014 FINDINGS: Normal heart size and mediastinal contours. No acute infiltrate or edema. No effusion or pneumothorax. 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 IMPRESSION: No active cardiopulmonary disease. Electronically Signed   By: Monte Fantasia M.D.   On: 08/11/2015 15:45   Dg Abd 1 View  08/11/2015  CLINICAL DATA:  Intermittent abdomen pain for 1 week EXAM: ABDOMEN - 1 VIEW COMPARISON:  None. FINDINGS: The bowel gas pattern is normal. No radio-opaque calculi or other significant radiographic abnormality are seen. There is scoliosis of spine. IMPRESSION: No bowel obstruction. Electronically Signed   By:  Abelardo Diesel M.D.   On: 08/11/2015 21:47   I have personally reviewed and evaluated these images and lab results as part of my medical decision-making.   EKG Interpretation   Date/Time:  Thursday August 11 2015 15:08:35 EDT Ventricular Rate:  57 PR Interval:  134 QRS Duration: 146 QT Interval:  464 QTC Calculation: 451 R Axis:   79 Text Interpretation:  Sinus bradycardia Right atrial enlargement Right  bundle branch block Abnormal ECG Confirmed by Deloris Mittag  MD, Jeanna Giuffre [phone])  on 08/11/2015 4:38:45 PM      MDM   Final diagnoses:  Abdominal pain in female    EKG and labs unremarkable. I counseled the hospitalist about chest pain admission for the patient the hospitalist felt like this could be GI related. The patient was given a GI cocktail and had flatplate of the abdomen done. X-ray was unremarkable her symptoms improved with the GI cocktail she was discharged with protonic Vicodin and told to follow-up with her primary care doctor   Milton Ferguson, MD 08/11/15 2217

## 2015-08-11 NOTE — Discharge Instructions (Signed)
Follow up with your family md next week.  Return if problems

## 2015-08-11 NOTE — Progress Notes (Signed)
   Subjective:    Patient ID: Jennifer Coleman, female    DOB: Mar 06, 1951, 64 y.o.   MRN: [id number] Patient arrives office with urgent concerns. And also chronic ones.  She is somewhat tearful and very anxious appearing during exam.  She reports a sense of shortness of breath and inability to catch her breath.  She also notes chest discomfort. Substernal. Aching at times. Seems to be worse with exertion.  Patient very concerned something serious may be going on Hypertension This is a chronic problem. Associated symptoms include chest pain, malaise/fatigue and shortness of breath. There are no compliance problems.    Tired all the time  Flushed feeling no enrgy for six months  Not taking fluid pill, not gym exrcise  fam hx as far as hear issues.other in law. No smoking fr 27 yrs ago   Patient in today with c/o of elevated blood pressure, nausea, sob, with weakness. Patient states onset of symptoms a week ago with symptoms worsening.   Patient states no other concerns this visit.   Review of Systems  Constitutional: Positive for malaise/fatigue.  Respiratory: Positive for shortness of breath.   Cardiovascular: Positive for chest pain.       Objective:   Physical Exam Alert anxious appearing vitals stable HEENT normal. Lungs clear. Heart regular rate and rhythm  EKG normal sinus rhythm right bundle branch block which is stable       Assessment & Plan:  Impression acute dyspnea along with chest pain along with substantial anxiety. #2 chronic fatigue diminished energy of months duration plan urgent referral to first rule out the more serious acute etiologies. Discussed with patient. I called emergency room and spoke with him also WSL

## 2015-08-11 NOTE — H&P (Signed)
[facility] Consult Note  Jennifer Coleman:[id number] DOB: 05-11-1951 DOA: 08/11/2015  Referring physician: Dr. Wolfgang Phoenix PCP: Mickie Hillier, MD   Chief Complaint: Epigastric pain/pressure  HPI: Jennifer Coleman is a 64 y.o. female  Epigastric pain/pressure. Started this morning when patient awoke. Fairly constant but with waxing and waning nature. Nonexertional. States it makes it hard for her to take a deep breath but denies any actual shortness of breath or dyspnea on exertion. States she gets some epigastric radiation of the sensation. Denies any nausea, diaphoresis, headache. Patient is never had a cardiac catheterization, but had a stress test one year ago which was normal. Patient states that she has had symptoms like this before but is never been given the diagnoses of heart attack. Endorses daily soft bowel movements and normal appetite.  Review of Systems:  Constitutional:  No weight loss, night sweats, Fevers, chills, fatigue.  HEENT:  No headaches, Difficulty swallowing,Tooth/dental problems,Sore throat, Cardio-vascular:  No chest pain, Orthopnea, PND, swelling in lower extremities, anasarca, dizziness, palpitations  GI: Per HPi Resp:   No excess mucus, no productive cough, No non-productive cough, No coughing up of blood.No change in color of mucus.No wheezing.No chest wall deformity  Skin:  no rash or lesions.  GU:  no dysuria, change in color of urine, no urgency or frequency. No flank pain.  Musculoskeletal:   No joint pain or swelling. No decreased range of motion. No back pain.  Psych:  No change in mood or affect. No depression or anxiety. No memory loss.  Neuro:  No change in sensation, unilateral strength, or cognitive abilities  All other systems were reviewed and are negative.  Past Medical History  Diagnosis Date  . Family history of anesthesia complication 25 yrs ago    Sister allergic to sccinocholine - unable to come off ventilator  . PVC's  (premature ventricular contractions)     Caffiene, allergy medicines  . Shingles   . Hypertension   . Depression    Past Surgical History  Procedure Laterality Date  . Cervical disc surgery  1992    Fusion  . Salpingoophorectomy Left 1985  . Knee arthroscopy  09/16/2012    Procedure: ARTHROSCOPY KNEE;  Surgeon: [name], MD;  Location: [place];  Service: Orthopedics;  Laterality: Right;  Right Knee Arthroscopy  . Carpal tunnel release     Social History:  reports that she has quit smoking. Her smoking use included Cigarettes. She quit smokeless tobacco use about 18 years ago. She reports that she does not drink alcohol or use illicit drugs.  Allergies  Allergen Reactions  . Penicillins Shortness Of Breath, Swelling and Rash  . Shellfish Allergy Shortness Of Breath and Swelling  . Morphine And Related Nausea And Vomiting  . Codeine Itching    Family History  Problem Relation Age of Onset  . Heart disease Sister   . Heart disease Brother     18's  . Diabetes Mellitus II Sister   . Multiple myeloma Mother   . Hypertension Sister      Prior to Admission medications   Medication Sig Start Date End Date Taking? Authorizing Provider  aspirin EC 81 MG tablet Take 81 mg by mouth daily.   Yes Historical Provider, MD  Calcium Carbonate-Vit D-Min (GNP CALCIUM PLUS 600 +D PO) Take 1 tablet by mouth 2 (two) times daily.   Yes Historical Provider, MD  enalapril (VASOTEC) 10 MG tablet Take 1 tablet (10 mg total) by mouth daily. 03/01/15  Yes Mikey Kirschner, MD  escitalopram (LEXAPRO) 20 MG tablet Take 1 tablet (20 mg total) by mouth daily. 03/01/15 02/29/16 Yes Mikey Kirschner, MD  estradiol (ESTRACE) 0.5 MG tablet Take 0.5 mg by mouth daily.   Yes Historical Provider, MD  Multiple Vitamins-Minerals (MULTIVITAMINS THER. W/MINERALS) TABS Take 1 tablet by mouth daily.   Yes Historical Provider, MD  cefdinir (OMNICEF) 300 MG capsule Take 1 capsule (300 mg total) by mouth 2 (two) times  daily. Patient not taking: Reported on 08/11/2015 04/11/15   Mikey Kirschner, MD  cetirizine (ZYRTEC) 10 MG tablet Take 10 mg by mouth daily as needed for allergies.     Historical Provider, MD  fluticasone (FLONASE) 50 MCG/ACT nasal spray Place 2 sprays into both nostrils daily. For sinus pressure 03/03/15   Mikey Kirschner, MD  ibuprofen (ADVIL,MOTRIN) 800 MG tablet Take 1 tablet (800 mg total) by mouth every 6 (six) hours as needed. 03/03/15   Mikey Kirschner, MD  nitroGLYCERIN (NITROSTAT) 0.4 MG SL tablet Place 1 tablet (0.4 mg total) under the tongue every 5 (five) minutes as needed for chest pain. Patient not taking: Reported on 08/11/2015 09/04/14   Rolland Porter, MD   Physical Exam: Filed Vitals:   08/11/15 1900 08/11/15 1915 08/11/15 1930 08/11/15 2000  BP: 139/51  128/48 133/51  Pulse:  66 60   Temp:      TempSrc:      Resp: _0 Height:      Weight:      SpO2:  100% 99%     Wt Readings from Last 3 Encounters:  08/11/15 104.327 kg (230 lb)  08/11/15 104.384 kg (230 lb 2 oz)  04/11/15 100.699 kg (222 lb)    General:  Appears calm and comfortable Eyes:  PERRL, EOMI, normal lids, iris ENT:  grossly normal hearing, lips & tongue Neck:  no LAD, masses or thyromegaly Cardiovascular:  RRR, no m/r/g. No LE edema.  Respiratory:  CTA bilaterally, no w/r/r. Normal respiratory effort. Abdomen:  soft, ntnd Skin:  no rash or induration seen on limited exam Musculoskeletal:  grossly normal tone BUE/BLE Psychiatric:  grossly normal mood and affect, speech fluent and appropriate Neurologic:  CN 2-12 grossly intact, moves all extremities in coordinated fashion.          Labs on Admission:  Basic Metabolic Panel:  Recent Labs Lab 08/11/15 1755  NA 140  K 4.1  CL 106  CO2 28  GLUCOSE 97  BUN 24*  CREATININE 1.16*  CALCIUM 9.6   Liver Function Tests:  Recent Labs Lab 08/11/15 1755  AST 23  ALT 14  ALKPHOS 75  BILITOT 0.4  PROT 6.7  ALBUMIN 3.8    Recent  Labs Lab 08/11/15 1755  LIPASE 51   No results for input(s): AMMONIA in the last 168 hours. CBC:  Recent Labs Lab 08/11/15 1720  WBC 7.6  HGB 14.0  HCT 41.6  MCV 96.1  PLT 187   Cardiac Enzymes:  Recent Labs Lab 08/11/15 1755  TROPONINI <0.03    BNP (last 3 results) No results for input(s): BNP in the last 8760 hours.  ProBNP (last 3 results) No results for input(s): PROBNP in the last 8760 hours.  CBG: No results for input(s): GLUCAP in the last 168 hours.  Radiological Exams on Admission: Dg Chest 2 View  08/11/2015  CLINICAL DATA:  Chest pain EXAM: CHEST  2 VIEW COMPARISON:  09/04/2014 FINDINGS: Normal heart size and  mediastinal contours. No acute infiltrate or edema. No effusion or pneumothorax. 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 IMPRESSION: No active cardiopulmonary disease. Electronically Signed   By: Monte Fantasia M.D.   On: 08/11/2015 15:45     Assessment/Plan Active Problems:   Essential hypertension   Epigastric pain   Chest pain, rule out acute myocardial infarction   Renal insufficiency  Called by ED to evaluate patient for possible admission. There is concern for chest pain related to cardiac etiology but upon further description of symptoms it appears that her discomfort is epigastric in nature. Regardless her HEART Score is 2-3 which warrants outpatient follow-up but does not require inpatient rule out. EKG is unchanged from previous showing right bundle branch block but no sign of ACS, troponin negative,. Patient is afebrile and hemodynamically stable. Lipase normal. At this time I have ordered a GI cocktail as well as a KUB to further evaluate GI etiology. Suspect this may also be due to mild gastroenteritis versus an H. pylori infection of her stomach.  Of note there is a slight increase in her creatinine to 1.16. Suspect renal etiology versus lab error and would recommend  repeat creatinine at her primary care physician's office in one week.  Disposition Plan: Pending workup but likely DC home    MERRELL, DAVID J, MD Family Medicine [facility] [email or web address] Password [email or web address]

## 2015-08-11 NOTE — ED Notes (Signed)
Pt alert & oriented x4, stable gait. Patient given discharge instructions, paperwork & prescription(s). Patient informed not to drive, operate any equipment & handel any important documents 4 hours after taking pain medication. Patient  instructed to stop at the registration desk to finish any additional paperwork. Patient  verbalized understanding. Pt left department w/ no further questions. 

## 2015-08-11 NOTE — ED Notes (Signed)
Intermittent "chest tightness" and pressure to head x 2 days. Nausea. Denies pain at present.

## 2015-08-19 ENCOUNTER — Ambulatory Visit (INDEPENDENT_AMBULATORY_CARE_PROVIDER_SITE_OTHER): Payer: 59 | Admitting: Family Medicine

## 2015-08-19 ENCOUNTER — Encounter: Payer: Self-pay | Admitting: Family Medicine

## 2015-08-19 VITALS — BP 146/92 | Ht 66.0 in | Wt 230.0 lb

## 2015-08-19 DIAGNOSIS — I10 Essential (primary) hypertension: Secondary | ICD-10-CM | POA: Diagnosis not present

## 2015-08-19 DIAGNOSIS — R0600 Dyspnea, unspecified: Secondary | ICD-10-CM | POA: Diagnosis not present

## 2015-08-19 DIAGNOSIS — R0789 Other chest pain: Secondary | ICD-10-CM

## 2015-08-19 MED ORDER — HYDROCHLOROTHIAZIDE 25 MG PO TABS: 25.0000 mg | ORAL_TABLET | Freq: Every day | ORAL | Status: DC

## 2015-08-19 MED ORDER — ENALAPRIL MALEATE 20 MG PO TABS: 20.0000 mg | ORAL_TABLET | Freq: Every day | ORAL | Status: DC

## 2015-08-19 NOTE — Progress Notes (Signed)
   Subjective:    Patient ID: Jennifer Coleman, female    DOB: 19-Apr-1951, 63 y.o.   MRN: [id number]  HPI  please see prior note. Patient still having tremendous challenges. Next  Complete hospital record is reviewed today and presence of patient. She had a fairly thorough workup for acute chest pain. Was felt not to be experiencing any type of coronary problem.  Patient still experiencing a lot of symptoms feels short of breath at times notes headache. Admits she is very stressed out. Feels very irritable this been gone for a number of months.   when patient starts to get a headache she notes her blood pressures been extremely high. She claims complete compliance with her blood pressure medication.  Also missing film irritable somewhat down will admit to frank depression but realizes her mood is not what it should be.   next  Patient still expressing chest discomfort achy at times comes goes not particularly associated with exercise. Patient is worried about her heart however  Patient arrives for a follow up from recent ER visit for chest pain and elevated BP.  Review of Systems  no current headache chest pain no abdominal pain no change in bowel habits    Objective:   Physical Exam   alert vital stable blood pressure 156/98 neck supple lungs clear. Heart rare rhythm ankles without edema      Assessment & Plan:   impression 1 #1 chest pain nonspecific nonexertional but patient understandably anxious about it. #1.5 hypertension suboptimal control. With substantial side effects related to it. #2 fatigue longer standing may be related to #3 #3 anxiety with element of depression plan first stains first. Cardiology referral. Increase Vasotec. Add morning hydrochlorothiazide. Warning signs discussed carefully. Follow-up in several weeks. WSL

## 2015-08-24 ENCOUNTER — Encounter: Payer: Self-pay | Admitting: Family Medicine

## 2015-08-25 ENCOUNTER — Encounter: Payer: Self-pay | Admitting: Family Medicine

## 2015-09-06 ENCOUNTER — Encounter: Payer: Self-pay | Admitting: Cardiovascular Disease

## 2015-09-06 ENCOUNTER — Ambulatory Visit (INDEPENDENT_AMBULATORY_CARE_PROVIDER_SITE_OTHER): Payer: 59 | Admitting: Cardiovascular Disease

## 2015-09-06 VITALS — BP 158/88 | HR 64 | Ht 66.0 in | Wt 232.0 lb

## 2015-09-06 DIAGNOSIS — R079 Chest pain, unspecified: Secondary | ICD-10-CM

## 2015-09-06 DIAGNOSIS — Z87898 Personal history of other specified conditions: Secondary | ICD-10-CM | POA: Diagnosis not present

## 2015-09-06 DIAGNOSIS — Z638 Other specified problems related to primary support group: Secondary | ICD-10-CM

## 2015-09-06 DIAGNOSIS — F439 Reaction to severe stress, unspecified: Secondary | ICD-10-CM

## 2015-09-06 DIAGNOSIS — I10 Essential (primary) hypertension: Secondary | ICD-10-CM

## 2015-09-06 DIAGNOSIS — Z9289 Personal history of other medical treatment: Secondary | ICD-10-CM

## 2015-09-06 MED ORDER — AMLODIPINE BESYLATE 5 MG PO TABS: 5.0000 mg | ORAL_TABLET | Freq: Every day | ORAL | Status: DC

## 2015-09-06 NOTE — Patient Instructions (Signed)
   Begin Amlodipine 5mg  daily - new sent to [facility] today. Continue all other medications.   Your physician has requested that you have an echocardiogram. Echocardiography is a painless test that uses sound waves to create images of your heart. It provides your doctor with information about the size and shape of your heart and how well your heart's chambers and valves are working. This procedure takes approximately one hour. There are no restrictions for this procedure. Office will contact with results via phone or letter.   Follow up in  1 month with Dr. Domenic Polite.

## 2015-09-06 NOTE — Progress Notes (Signed)
Patient ID: BLANCHE SCOVELL, female   DOB: January 29, 1951, 64 y.o.   MRN: [id number]      SUBJECTIVE: The patient is a 64 year old woman with a history of chest pain who has previously been evaluated by my colleague, Dr. Domenic Polite.  She underwent a nuclear stress test on 09/08/14 which was interpreted as a "mildly abnormal study. There was a small, mid anterior defect that represents possible variable soft tissue attenuation. However, a small region of ischemia could not entirely be excluded. Low risk Duke treadmill score of 6. Left ventricular wall motion was normal. Calculated LVEF 63%." This was deemed a low risk study.  She was evaluated in the ED on 62/13/16 for chest pain which was deemed GI related. Troponin was normal. ECG showed normal sinus rhythm with a right bundle branch block.  She has been having chest pain both with and without exertion. It has sometimes awoken her at night. When the chest pain is at its worst, she feels pulsations in the left side of her neck. She thought some of this may be attributed to anxiety and she was started on Lexapro by her PCP. She and her husband are taking care of their 2 grandchildren as their own, ages 46 and 68, which has increased her stress. She has also gained a considerable amount of weight and used to walk 3 miles daily.   She is a Careers information officer at [employer]: As per "subjective", otherwise negative.  Allergies  Allergen Reactions  . Penicillins Shortness Of Breath, Swelling and Rash  . Shellfish Allergy Shortness Of Breath and Swelling  . Morphine And Related Nausea And Vomiting  . Codeine Itching    Current Outpatient Prescriptions  Medication Sig Dispense Refill  . Calcium Carbonate-Vit D-Min (GNP CALCIUM PLUS 600 +D PO) Take 1 tablet by mouth 2 (two) times daily.    . cetirizine (ZYRTEC) 10 MG tablet Take 10 mg by mouth daily as needed for allergies.     Marland Kitchen enalapril (VASOTEC) 20 MG tablet Take 1 tablet (20  mg total) by mouth at bedtime. 30 tablet 0  . escitalopram (LEXAPRO) 20 MG tablet Take 1 tablet (20 mg total) by mouth daily. 90 tablet 1  . estradiol (ESTRACE) 0.5 MG tablet Take 0.5 mg by mouth daily.    . fluticasone (FLONASE) 50 MCG/ACT nasal spray 2 sprays as needed for allergies or rhinitis.    . hydrochlorothiazide (HYDRODIURIL) 25 MG tablet Take 1 tablet (25 mg total) by mouth daily. 30 tablet 0  . ibuprofen (ADVIL,MOTRIN) 800 MG tablet Take 1 tablet (800 mg total) by mouth every 6 (six) hours as needed. 90 tablet 5  . Multiple Vitamins-Minerals (MULTIVITAMINS THER. W/MINERALS) TABS Take 1 tablet by mouth daily.    . nitroGLYCERIN (NITROSTAT) 0.4 MG SL tablet Place 1 tablet (0.4 mg total) under the tongue every 5 (five) minutes as needed for chest pain. 30 tablet 0   No current facility-administered medications for this visit.    Past Medical History  Diagnosis Date  . Family history of anesthesia complication 25 yrs ago    Sister allergic to sccinocholine - unable to come off ventilator  . PVC's (premature ventricular contractions)     Caffiene, allergy medicines  . Shingles   . Hypertension   . Depression     Past Surgical History  Procedure Laterality Date  . Cervical disc surgery  1992    Fusion  . Salpingoophorectomy Left 1985  .  Knee arthroscopy  09/16/2012    Procedure: ARTHROSCOPY KNEE;  Surgeon: [name], MD;  Location: [place];  Service: Orthopedics;  Laterality: Right;  Right Knee Arthroscopy  . Carpal tunnel release      Social History   Social History  . Marital Status: Married    Spouse Name: N/A  . Number of Children: N/A  . Years of Education: N/A   Occupational History  . Not on file.   Social History Main Topics  . Smoking status: Former Smoker    Types: Cigarettes  . Smokeless tobacco: Former Systems developer    Quit date: 01/12/1997  . Alcohol Use: No  . Drug Use: No  . Sexual Activity: Yes   Other Topics Concern  . Not on file   Social  History Narrative     Filed Vitals:   09/06/15 1452  BP: 158/88  Pulse: 64  Height: 5\' 6"  (1.676 m)  Weight: 232 lb (105.235 kg)  SpO2: 99%    PHYSICAL EXAM General: NAD HEENT: Normal. Neck: No JVD, no thyromegaly. Lungs: Clear to auscultation bilaterally with normal respiratory effort. CV: Nondisplaced PMI.  Regular rate and rhythm, normal S1/S2, no S3/S4, no murmur. No pretibial or periankle edema.  No carotid bruit.  Normal pedal pulses.  Abdomen: Soft, nontender, obese.  Neurologic: Alert and oriented x 3.  Psych: Normal affect. Skin: Normal. Musculoskeletal: No gross deformities. Extremities: No clubbing or cyanosis.   ECG: Most recent ECG reviewed.      ASSESSMENT AND PLAN: 1. Chest pain: Low risk nuclear stress test in 08/2014. Atypical and typical features. Will obtain an echocardiogram to assess cardiac structure and function. Will also add amlodipine 5 mg both for antihypertensive as well as anti-anginal benefit.  2. Essential HTN: Elevated. Will add amlodipine 5 mg both for antihypertensive as well as anti-anginal benefit.   Dispo: f/u with Dr. Domenic Polite in one month.  Time spent: 40 minutes, of which greater than 50% was spent reviewing symptoms, relevant blood tests and studies, and discussing management plan with the patient.   Kate Sable, M.D., F.A.C.C.

## 2015-09-10 LAB — BASIC METABOLIC PANEL
BUN / CREAT RATIO: 20 (ref 11–26)
BUN: 20 mg/dL (ref 8–27)
CALCIUM: 9.8 mg/dL (ref 8.7–10.3)
CO2: 28 mmol/L (ref 18–29)
Chloride: 98 mmol/L (ref 97–106)
Creatinine, Ser: 1.02 mg/dL — ABNORMAL HIGH (ref 0.57–1.00)
GFR, EST AFRICAN AMERICAN: 68 mL/min/{1.73_m2} (ref >59)
GFR, EST NON AFRICAN AMERICAN: 59 mL/min/{1.73_m2} — AB (ref >59)
Glucose: 87 mg/dL (ref 65–99)
Potassium: 3.9 mmol/L (ref 3.5–5.2)
Sodium: 140 mmol/L (ref 136–144)

## 2015-09-12 ENCOUNTER — Ambulatory Visit (INDEPENDENT_AMBULATORY_CARE_PROVIDER_SITE_OTHER): Payer: 59 | Admitting: Family Medicine

## 2015-09-12 ENCOUNTER — Encounter: Payer: Self-pay | Admitting: Family Medicine

## 2015-09-12 VITALS — Temp 98.7°F | Ht 66.0 in | Wt 229.0 lb

## 2015-09-12 DIAGNOSIS — J329 Chronic sinusitis, unspecified: Secondary | ICD-10-CM

## 2015-09-12 MED ORDER — CEFDINIR 300 MG PO CAPS: 300.0000 mg | ORAL_CAPSULE | Freq: Two times a day (BID) | ORAL | Status: DC

## 2015-09-12 NOTE — Progress Notes (Signed)
   Subjective:    Patient ID: Jennifer Coleman, female    DOB: Jan 05, 1951, 64 y.o.   MRN: [id number]  Cough This is a new problem. The current episode started in the past 7 days. Associated symptoms include ear pain, nasal congestion and a sore throat. Treatments tried: zyrtec, sudafed.   Diminished energy. Achy. Not much appetite. Next  Frontal headache comes and goes sharp in nature some radiation the maxillary region  Ear fullness   Review of Systems  HENT: Positive for ear pain and sore throat.   Respiratory: Positive for cough.        Objective:   Physical Exam Ale impression rrt nad pos frontal tend bp 140 over 82  Lungs clear nasal cong tms effusion bilat hart rrr  Imp rhinosinutis, abx rxed synmto care disc warnins signs disc     Assessment & Plan:

## 2015-09-24 ENCOUNTER — Other Ambulatory Visit: Payer: Self-pay | Admitting: Family Medicine

## 2015-09-26 ENCOUNTER — Other Ambulatory Visit: Payer: Self-pay

## 2015-09-26 ENCOUNTER — Other Ambulatory Visit: Payer: Self-pay | Admitting: Family Medicine

## 2015-09-26 MED ORDER — IBUPROFEN 800 MG PO TABS: 800.0000 mg | ORAL_TABLET | Freq: Three times a day (TID) | ORAL | Status: DC | PRN

## 2015-09-29 ENCOUNTER — Ambulatory Visit (INDEPENDENT_AMBULATORY_CARE_PROVIDER_SITE_OTHER): Payer: 59

## 2015-09-29 ENCOUNTER — Other Ambulatory Visit: Payer: Self-pay

## 2015-09-29 DIAGNOSIS — R079 Chest pain, unspecified: Secondary | ICD-10-CM | POA: Diagnosis not present

## 2015-09-29 DIAGNOSIS — I10 Essential (primary) hypertension: Secondary | ICD-10-CM | POA: Diagnosis not present

## 2015-10-04 ENCOUNTER — Encounter: Payer: Self-pay | Admitting: *Deleted

## 2015-10-05 ENCOUNTER — Other Ambulatory Visit: Payer: Self-pay | Admitting: *Deleted

## 2015-10-05 MED ORDER — ESCITALOPRAM OXALATE 20 MG PO TABS: 20.0000 mg | ORAL_TABLET | Freq: Every day | ORAL | Status: DC

## 2015-10-05 MED ORDER — ENALAPRIL MALEATE 20 MG PO TABS: 20.0000 mg | ORAL_TABLET | Freq: Every day | ORAL | Status: DC

## 2015-10-14 ENCOUNTER — Ambulatory Visit: Payer: 59 | Admitting: Cardiology

## 2015-10-21 ENCOUNTER — Telehealth: Payer: Self-pay | Admitting: Family Medicine

## 2015-10-21 MED ORDER — CEFDINIR 300 MG PO CAPS: 300.0000 mg | ORAL_CAPSULE | Freq: Two times a day (BID) | ORAL | Status: DC

## 2015-10-21 NOTE — Telephone Encounter (Signed)
omnicef 300 twice a day 10 days

## 2015-10-21 NOTE — Telephone Encounter (Signed)
Pt is calling to say that she has a re-occurence of her Rhinosinusitis  She would like another round of antibiotic called in please  To [facility]   Cough, yellow phlegm, drainage thick, feels its headed to her chest

## 2015-10-21 NOTE — Telephone Encounter (Signed)
Rx sent electronically to pharmacy. Patient notified. 

## 2015-10-28 ENCOUNTER — Encounter: Payer: Self-pay | Admitting: Cardiology

## 2015-10-28 ENCOUNTER — Encounter: Payer: 59 | Admitting: Cardiology

## 2015-10-28 NOTE — Progress Notes (Signed)
Patient cancelled   This encounter was created in error - please disregard. 

## 2015-11-09 ENCOUNTER — Telehealth: Payer: Self-pay | Admitting: Family Medicine

## 2015-11-09 MED ORDER — HYDROCHLOROTHIAZIDE 25 MG PO TABS: 25.0000 mg | ORAL_TABLET | Freq: Every day | ORAL | Status: DC

## 2015-11-09 MED FILL — HYDROCHLOROTHIAZIDE 25 MG T: 25 | 30 days supply | Qty: 30 | Fill #0

## 2015-11-09 NOTE — Telephone Encounter (Signed)
Called patient and informed her per Protocol refill on HCTZ was sent into [facility]. Patient verbalized understanding.

## 2015-11-09 NOTE — Telephone Encounter (Signed)
hydrochlorothiazide (HYDRODIURIL) 25 MG tablet  Pt would like a refill on this sent to [facility]

## 2015-12-27 ENCOUNTER — Ambulatory Visit (INDEPENDENT_AMBULATORY_CARE_PROVIDER_SITE_OTHER): Payer: 59 | Admitting: Family Medicine

## 2015-12-27 ENCOUNTER — Encounter: Payer: Self-pay | Admitting: Family Medicine

## 2015-12-27 VITALS — BP 130/90 | Temp 98.6°F | Ht 66.0 in | Wt 237.1 lb

## 2015-12-27 DIAGNOSIS — R5383 Other fatigue: Secondary | ICD-10-CM

## 2015-12-27 DIAGNOSIS — F32A Depression, unspecified: Secondary | ICD-10-CM

## 2015-12-27 DIAGNOSIS — Z13220 Encounter for screening for lipoid disorders: Secondary | ICD-10-CM

## 2015-12-27 DIAGNOSIS — F329 Major depressive disorder, single episode, unspecified: Secondary | ICD-10-CM | POA: Diagnosis not present

## 2015-12-27 DIAGNOSIS — Z79899 Other long term (current) drug therapy: Secondary | ICD-10-CM

## 2015-12-27 DIAGNOSIS — I10 Essential (primary) hypertension: Secondary | ICD-10-CM

## 2015-12-27 MED ORDER — DICLOFENAC SODIUM 1 % TD GEL
TRANSDERMAL | Status: DC
Start: 2015-12-27 — End: 2016-10-26

## 2015-12-27 MED ORDER — HYDROCHLOROTHIAZIDE 25 MG PO TABS: 25.0000 mg | ORAL_TABLET | Freq: Every day | ORAL | Status: DC

## 2015-12-27 MED ORDER — ENALAPRIL MALEATE 20 MG PO TABS: ORAL_TABLET | ORAL | Status: DC

## 2015-12-27 MED FILL — ENALAPRIL MALEATE 20 MG TAB: 20 | 90 days supply | Qty: 180 | Fill #0

## 2015-12-27 MED FILL — HYDROCHLOROTHIAZIDE 25 MG T: 25 | 90 days supply | Qty: 90 | Fill #0

## 2015-12-27 MED FILL — DICLOFENAC SODIUM 1% GEL: 1 | 30 days supply | Qty: 100 | Fill #0

## 2015-12-27 NOTE — Progress Notes (Signed)
   Subjective:    Patient ID: Jennifer Coleman, female    DOB: 04/19/1951, 65 y.o.   MRN: [id number]  Hypertension This is a chronic problem. The current episode started more than 1 year ago. There are no compliance problems.    Patient also has c/o sinusitis. Feverish and felt achey and felt like running temp. on further history had several days of just not feeling the best. Felt slightly febrile. Not really sure if those true sinusitis. Seems to be feeling better now.  Onset 6 months. Patient states get better than goes away.  BP geernally good, sometimes a little hi, bordrline  Walks a lot, fifteen miutes twice per wk. Compliant with medication. Does not miss a blood pressure dose.  Compliant with antidepressant medication. States overall seems to be helping her mood. Still very frustrated by work. Feels that she will be more than ready to retire. When the time comes in a few years.  Patient notes profound fatigue. She's worried about this. She is not exercising.   Heart doc felt heart was fine,,  Review of Systems No headache no chest pain and back pain no abdominal pain no change in bowel habits ROS otherwise negative    Objective:   Physical Exam  Alert vitals stable. Blood pressure good on repeat HEENT slight nasal congestion most drinks normal neck supple lungs clear heart rare rhythm affect mildly depressed      Assessment & Plan:  Impression 1 hypertension good control discussed maintain same dose #2 depression ongoing fair control definitely needs medicine #3 recent viral syndrome doubt true sinusitis #4 fatigue substantial plan appropriate blood work. Diet exercise discussed maintain same medications. Meds refilled. Recheck in 6 months WSL

## 2015-12-30 DIAGNOSIS — Z79899 Other long term (current) drug therapy: Secondary | ICD-10-CM | POA: Diagnosis not present

## 2015-12-30 DIAGNOSIS — Z13220 Encounter for screening for lipoid disorders: Secondary | ICD-10-CM | POA: Diagnosis not present

## 2015-12-30 DIAGNOSIS — R5383 Other fatigue: Secondary | ICD-10-CM | POA: Diagnosis not present

## 2015-12-31 LAB — BASIC METABOLIC PANEL
BUN / CREAT RATIO: 21 (ref 11–26)
BUN: 17 mg/dL (ref 8–27)
CALCIUM: 10.1 mg/dL (ref 8.7–10.3)
CO2: 25 mmol/L (ref 18–29)
Chloride: 98 mmol/L (ref 96–106)
Creatinine, Ser: 0.81 mg/dL (ref 0.57–1.00)
GFR calc Af Amer: 90 mL/min/{1.73_m2} (ref >59)
GFR calc non Af Amer: 78 mL/min/{1.73_m2} (ref >59)
Glucose: 86 mg/dL (ref 65–99)
Potassium: 3.9 mmol/L (ref 3.5–5.2)
Sodium: 139 mmol/L (ref 134–144)

## 2015-12-31 LAB — CBC WITH DIFFERENTIAL/PLATELET
Basophils Absolute: 0 10*3/uL (ref 0.0–0.2)
Basos: 0 %
EOS (ABSOLUTE): 0.1 10*3/uL (ref 0.0–0.4)
Eos: 1 %
Hematocrit: 43.2 % (ref 34.0–46.6)
Hemoglobin: 14.5 g/dL (ref 11.1–15.9)
IMMATURE GRANULOCYTES: 0 %
Immature Grans (Abs): 0 10*3/uL (ref 0.0–0.1)
LYMPHS ABS: 2.5 10*3/uL (ref 0.7–3.1)
Lymphs: 32 %
MCH: 32.2 pg (ref 26.6–33.0)
MCHC: 33.6 g/dL (ref 31.5–35.7)
MCV: 96 fL (ref 79–97)
MONOS ABS: 0.6 10*3/uL (ref 0.1–0.9)
Monocytes: 7 %
NEUTROS PCT: 60 %
Neutrophils Absolute: 4.7 10*3/uL (ref 1.4–7.0)
PLATELETS: 287 10*3/uL (ref 150–379)
RBC: 4.51 x10E6/uL (ref 3.77–5.28)
RDW: 13.3 % (ref 12.3–15.4)
WBC: 8 10*3/uL (ref 3.4–10.8)

## 2015-12-31 LAB — HEPATIC FUNCTION PANEL
ALBUMIN: 4.3 g/dL (ref 3.6–4.8)
ALT: 13 IU/L (ref 0–32)
AST: 23 IU/L (ref 0–40)
Alkaline Phosphatase: 75 IU/L (ref 39–117)
Bilirubin Total: 0.4 mg/dL (ref 0.0–1.2)
Bilirubin, Direct: 0.13 mg/dL (ref 0.00–0.40)
Total Protein: 6.8 g/dL (ref 6.0–8.5)

## 2015-12-31 LAB — TSH: TSH: 3.5 u[IU]/mL (ref 0.450–4.500)

## 2015-12-31 LAB — LIPID PANEL
CHOL/HDL RATIO: 2.6 ratio (ref 0.0–4.4)
Cholesterol, Total: 260 mg/dL — ABNORMAL HIGH (ref 100–199)
HDL: 100 mg/dL (ref >39)
LDL CALC: 139 mg/dL — AB (ref 0–99)
Triglycerides: 107 mg/dL (ref 0–149)
VLDL Cholesterol Cal: 21 mg/dL (ref 5–40)

## 2016-01-01 ENCOUNTER — Encounter: Payer: Self-pay | Admitting: Family Medicine

## 2016-01-04 ENCOUNTER — Other Ambulatory Visit (HOSPITAL_COMMUNITY): Payer: Self-pay | Admitting: Obstetrics and Gynecology

## 2016-01-04 DIAGNOSIS — Z1231 Encounter for screening mammogram for malignant neoplasm of breast: Secondary | ICD-10-CM

## 2016-01-05 ENCOUNTER — Ambulatory Visit (HOSPITAL_COMMUNITY)
Admission: RE | Admit: 2016-01-05 | Discharge: 2016-01-05 | Disposition: A | Discharge status: Home / Self Care | Payer: 59 | Source: Ambulatory Visit | Attending: Obstetrics and Gynecology | Admitting: Obstetrics and Gynecology

## 2016-01-05 DIAGNOSIS — Z1231 Encounter for screening mammogram for malignant neoplasm of breast: Secondary | ICD-10-CM | POA: Diagnosis not present

## 2016-01-11 ENCOUNTER — Telehealth: Payer: Self-pay | Admitting: Family Medicine

## 2016-01-11 NOTE — Telephone Encounter (Signed)
Pt called wanting to know the results to her recent labs. 

## 2016-01-11 NOTE — Telephone Encounter (Signed)
Tell her letter dict march 5 not sure why did not go out, send to her

## 2016-01-11 NOTE — Telephone Encounter (Addendum)
Letter read to patient and advised a copy would be sent to her via mail. Medical records to mail letter.

## 2016-01-23 MED FILL — ESTRADIOL-NORETH 0.5-0.1 MG: 0.5-0.1 | 84 days supply | Qty: 84 | Fill #3

## 2016-01-25 DIAGNOSIS — M71011 Abscess of bursa, right shoulder: Secondary | ICD-10-CM | POA: Diagnosis not present

## 2016-01-25 DIAGNOSIS — M25511 Pain in right shoulder: Secondary | ICD-10-CM | POA: Diagnosis not present

## 2016-02-02 ENCOUNTER — Other Ambulatory Visit: Payer: Self-pay | Admitting: Physician Assistant

## 2016-02-02 DIAGNOSIS — C44219 Basal cell carcinoma of skin of left ear and external auricular canal: Secondary | ICD-10-CM | POA: Diagnosis not present

## 2016-02-02 DIAGNOSIS — D485 Neoplasm of uncertain behavior of skin: Secondary | ICD-10-CM | POA: Diagnosis not present

## 2016-02-02 DIAGNOSIS — C4491 Basal cell carcinoma of skin, unspecified: Secondary | ICD-10-CM

## 2016-02-02 DIAGNOSIS — C44529 Squamous cell carcinoma of skin of other part of trunk: Secondary | ICD-10-CM | POA: Diagnosis not present

## 2016-02-02 HISTORY — DX: Basal cell carcinoma of skin, unspecified: C44.91

## 2016-02-24 ENCOUNTER — Other Ambulatory Visit (HOSPITAL_COMMUNITY): Payer: Self-pay | Admitting: Orthopaedic Surgery

## 2016-02-24 DIAGNOSIS — M25511 Pain in right shoulder: Secondary | ICD-10-CM

## 2016-02-27 ENCOUNTER — Other Ambulatory Visit: Payer: Self-pay | Admitting: Orthopaedic Surgery

## 2016-02-27 DIAGNOSIS — M25511 Pain in right shoulder: Secondary | ICD-10-CM

## 2016-02-28 ENCOUNTER — Ambulatory Visit
Admission: RE | Admit: 2016-02-28 | Discharge: 2016-02-28 | Disposition: A | Discharge status: Home / Self Care | Payer: 59 | Source: Ambulatory Visit | Attending: Orthopaedic Surgery | Admitting: Orthopaedic Surgery

## 2016-02-28 DIAGNOSIS — M25511 Pain in right shoulder: Secondary | ICD-10-CM

## 2016-02-28 DIAGNOSIS — M75111 Incomplete rotator cuff tear or rupture of right shoulder, not specified as traumatic: Secondary | ICD-10-CM | POA: Diagnosis not present

## 2016-02-29 ENCOUNTER — Other Ambulatory Visit: Payer: Self-pay | Admitting: *Deleted

## 2016-02-29 DIAGNOSIS — I8312 Varicose veins of left lower extremity with inflammation: Secondary | ICD-10-CM

## 2016-02-29 DIAGNOSIS — M25511 Pain in right shoulder: Secondary | ICD-10-CM | POA: Diagnosis not present

## 2016-02-29 DIAGNOSIS — M7552 Bursitis of left shoulder: Secondary | ICD-10-CM | POA: Diagnosis not present

## 2016-03-01 ENCOUNTER — Encounter (HOSPITAL_COMMUNITY): Payer: Self-pay | Admitting: Emergency Medicine

## 2016-03-01 ENCOUNTER — Emergency Department (HOSPITAL_COMMUNITY)
Admission: EM | Admit: 2016-03-01 | Discharge: 2016-03-01 | Disposition: A | Discharge status: Home / Self Care | Payer: 59 | Attending: Emergency Medicine | Admitting: Emergency Medicine

## 2016-03-01 DIAGNOSIS — Y999 Unspecified external cause status: Secondary | ICD-10-CM | POA: Diagnosis not present

## 2016-03-01 DIAGNOSIS — Y93G1 Activity, food preparation and clean up: Secondary | ICD-10-CM | POA: Insufficient documentation

## 2016-03-01 DIAGNOSIS — I10 Essential (primary) hypertension: Secondary | ICD-10-CM | POA: Diagnosis not present

## 2016-03-01 DIAGNOSIS — S61012A Laceration without foreign body of left thumb without damage to nail, initial encounter: Secondary | ICD-10-CM | POA: Diagnosis not present

## 2016-03-01 DIAGNOSIS — Z87891 Personal history of nicotine dependence: Secondary | ICD-10-CM | POA: Diagnosis not present

## 2016-03-01 DIAGNOSIS — S61112A Laceration without foreign body of left thumb with damage to nail, initial encounter: Secondary | ICD-10-CM | POA: Diagnosis not present

## 2016-03-01 DIAGNOSIS — W260XXA Contact with knife, initial encounter: Secondary | ICD-10-CM | POA: Insufficient documentation

## 2016-03-01 DIAGNOSIS — F329 Major depressive disorder, single episode, unspecified: Secondary | ICD-10-CM | POA: Insufficient documentation

## 2016-03-01 DIAGNOSIS — C44529 Squamous cell carcinoma of skin of other part of trunk: Secondary | ICD-10-CM | POA: Diagnosis not present

## 2016-03-01 DIAGNOSIS — Z79899 Other long term (current) drug therapy: Secondary | ICD-10-CM | POA: Diagnosis not present

## 2016-03-01 DIAGNOSIS — Y929 Unspecified place or not applicable: Secondary | ICD-10-CM | POA: Diagnosis not present

## 2016-03-01 DIAGNOSIS — Z23 Encounter for immunization: Secondary | ICD-10-CM | POA: Diagnosis not present

## 2016-03-01 MED ORDER — LIDOCAINE HCL (PF) 1 % IJ SOLN
5.0000 mL | Freq: Once | INTRAMUSCULAR | Status: AC
  Administered 2016-03-01: 5 mL
  Filled 2016-03-01: qty 5

## 2016-03-01 MED ORDER — TETANUS-DIPHTH-ACELL PERTUSSIS 5-2.5-18.5 LF-MCG/0.5 IM SUSP
0.5000 mL | Freq: Once | INTRAMUSCULAR | Status: AC
  Administered 2016-03-01: 0.5 mL via INTRAMUSCULAR
  Filled 2016-03-01: qty 0.5

## 2016-03-01 NOTE — ED Provider Notes (Signed)
CSN: [id number]     Arrival date & time 03/01/16  1847 History   First MD Initiated Contact with Patient 03/01/16 1910     Chief Complaint  Patient presents with  . Laceration     (Consider location/radiation/quality/duration/timing/severity/associated sxs/prior Treatment) HPI Jennifer Coleman is a 65 y.o. female who presents to the ED with left thumb laceration. Patient reports that she got a new knife that was very sharp and while cutting potatoes the knife slipped and cut her thumb. She denies any other injuries.   Past Medical History  Diagnosis Date  . Family history of anesthesia complication 25 yrs ago    Sister allergic to sccinocholine - unable to come off ventilator  . PVC's (premature ventricular contractions)     Caffiene, allergy medicines  . Shingles   . Hypertension   . Depression    Past Surgical History  Procedure Laterality Date  . Cervical disc surgery  1992    Fusion  . Salpingoophorectomy Left 1985  . Knee arthroscopy  09/16/2012    Procedure: ARTHROSCOPY KNEE;  Surgeon: Valeria Batman, MD;  Location: [facility] OR;  Service: Orthopedics;  Laterality: Right;  Right Knee Arthroscopy  . Carpal tunnel release     Family History  Problem Relation Age of Onset  . Heart disease Sister   . Heart disease Brother     6's  . Diabetes Mellitus II Sister   . Multiple myeloma Mother   . Hypertension Sister    Social History  Substance Use Topics  . Smoking status: Former Smoker    Types: Cigarettes  . Smokeless tobacco: Former Neurosurgeon    Quit date: 01/12/1997  . Alcohol Use: No   OB History    No data available     Review of Systems Negative except as stated in HPI   Allergies  Penicillins; Shellfish allergy; Morphine and related; and Codeine  Home Medications   Prior to Admission medications   Medication Sig Start Date End Date Taking? Authorizing Provider  amLODipine (NORVASC) 5 MG tablet Take 1 tablet (5 mg total) by mouth daily. 09/06/15   Laqueta Linden, MD  Calcium Carbonate-Vit D-Min (GNP CALCIUM PLUS 600 +D PO) Take 1 tablet by mouth 2 (two) times daily.    Historical Provider, MD  cefdinir (OMNICEF) 300 MG capsule Take 1 capsule (300 mg total) by mouth 2 (two) times daily. Patient not taking: Reported on 12/27/2015 10/21/15   Merlyn Albert, MD  cetirizine (ZYRTEC) 10 MG tablet Take 10 mg by mouth daily as needed for allergies.     Historical Provider, MD  diclofenac sodium (VOLTAREN) 1 % GEL Apply to right knee 3 times a day as needed. 12/27/15   Merlyn Albert, MD  enalapril (VASOTEC) 20 MG tablet Take 1 tablet by mouth twice a day. 12/27/15   Merlyn Albert, MD  escitalopram (LEXAPRO) 20 MG tablet Take 1 tablet (20 mg total) by mouth daily. 10/05/15 10/04/16  Merlyn Albert, MD  estradiol (ESTRACE) 0.5 MG tablet Take 0.5 mg by mouth daily.    Historical Provider, MD  fluticasone (FLONASE) 50 MCG/ACT nasal spray 2 sprays as needed for allergies or rhinitis.    Historical Provider, MD  hydrochlorothiazide (HYDRODIURIL) 25 MG tablet Take 1 tablet (25 mg total) by mouth daily. 12/27/15   Merlyn Albert, MD  ibuprofen (ADVIL,MOTRIN) 800 MG tablet Take 1 tablet (800 mg total) by mouth 3 (three) times daily as needed. 09/26/15   Scott  Trude Mcburney, MD  Multiple Vitamins-Minerals (MULTIVITAMINS THER. W/MINERALS) TABS Take 1 tablet by mouth daily.    Historical Provider, MD  nitroGLYCERIN (NITROSTAT) 0.4 MG SL tablet Place 1 tablet (0.4 mg total) under the tongue every 5 (five) minutes as needed for chest pain. 09/04/14   Rolland Porter, MD   BP 146/75 mmHg  Pulse 62  Temp(Src) 98.4 F (36.9 C) (Oral)  Resp 17  Wt 94.802 kg  SpO2 100% Physical Exam  Constitutional: She is oriented to person, place, and time. She appears well-developed and well-nourished.  HENT:  Head: Normocephalic and atraumatic.  Eyes: EOM are normal.  Neck: Neck supple.  Cardiovascular: Normal rate.   Pulmonary/Chest: Effort normal.  Musculoskeletal: Normal range  of motion.       Left hand: She exhibits tenderness and laceration. She exhibits normal range of motion, normal capillary refill, no deformity and no swelling. Normal sensation noted. Normal strength noted.       Hands: Neurological: She is alert and oriented to person, place, and time. No cranial nerve deficit.  Skin: Skin is warm and dry.  Psychiatric: She has a normal mood and affect. Her behavior is normal.  Nursing note and vitals reviewed.   ED Course  Procedures (including critical care time) LACERATION REPAIR Performed by: Deaunte Dente Authorized by: Lenis Nettleton Consent: Verbal consent obtained. Risks and benefits: risks, benefits and alternatives were discussed Consent given by: patient Patient identity confirmed: provided demographic data Prepped and Draped in normal sterile fashion Wound explored  Laceration Location: left thumb  Laceration Length: 1.2 cm  No Foreign Bodies seen or palpated  Anesthesia: local infiltration  Local anesthetic: lidocaine 1% without epinephrine  Anesthetic total: 1 ml  Irrigation method: syringe with NSS Amount of cleaning: standard  Skin closure: 5-0 prolene  Number of sutures: 3  Technique: interrupted  Patient tolerance: Patient tolerated the procedure well with no immediate complications.   Tetanus updated Pressure dressing  Labs Review Labs Reviewed - No data to display  Imaging   MDM  65 y.o. female with laceration to the left thumb stable for d/c without focal neuro deficits. She will f/u in one week for suture removal. She will return sooner for any problems.   Final diagnoses:  Laceration of thumb, left, initial encounter       [facility], NP 03/01/16 [place], MD 03/02/16 [phone]

## 2016-03-01 NOTE — Discharge Instructions (Signed)
Follow up with your doctor in one week for suture removal. Return here sooner for any problems.

## 2016-03-01 NOTE — ED Notes (Signed)
Laceration to tip of left thumb by kitchen knife, not currently on blood thinners

## 2016-03-01 NOTE — ED Notes (Signed)
Had patient soak left thumb in betadine and normal saline. Patient tolerating well.

## 2016-03-01 NOTE — ED Notes (Signed)
Covered wound with tefla and wrapped in sterile gauze. Patient tolerated well.

## 2016-03-02 ENCOUNTER — Ambulatory Visit (HOSPITAL_COMMUNITY): Payer: 59

## 2016-03-06 ENCOUNTER — Encounter: Payer: Self-pay | Admitting: Nurse Practitioner

## 2016-03-06 ENCOUNTER — Ambulatory Visit (INDEPENDENT_AMBULATORY_CARE_PROVIDER_SITE_OTHER): Payer: 59 | Admitting: Nurse Practitioner

## 2016-03-06 VITALS — BP 130/80 | Ht 66.0 in | Wt 238.0 lb

## 2016-03-06 DIAGNOSIS — L03032 Cellulitis of left toe: Secondary | ICD-10-CM

## 2016-03-06 MED ORDER — CLINDAMYCIN HCL 300 MG PO CAPS: 300.0000 mg | ORAL_CAPSULE | Freq: Three times a day (TID) | ORAL | Status: DC

## 2016-03-07 ENCOUNTER — Encounter: Payer: Self-pay | Admitting: Nurse Practitioner

## 2016-03-07 NOTE — Progress Notes (Signed)
Subjective:  Presents for c/o a blister on her left great toe that began 5 days ago. Started out small but has gotten larger lately; slightly tender. No fever. No history of injury or bite. Is not diabetic. Has venous stasis of the lower extremities.   Objective:   BP 130/80 mmHg  Ht 5\' 6"  (1.676 m)  Wt 238 lb (107.956 kg)  BMI 38.43 kg/m2 NAD. Alert, oriented. Mild edema and erythema noted at the end of the left great toe with approx 1 cm bullae noted. Mildly tender to palpation. Mild edema on top of both feet with chronic venous stasis color changes. Strong DP pulses. Normal cap refill. Has a small area on the medial part of the right great toe; unclear if it is related to current problem.  Assessment: Cellulitis of toe of left foot  Plan:  Meds ordered this encounter  Medications  . clindamycin (CLEOCIN) 300 MG capsule    Sig: Take 1 capsule (300 mg total) by mouth 3 (three) times daily.    Dispense:  21 capsule    Refill:  0    Order Specific Question:  Supervising Provider    Answer:  Mikey Kirschner [[id number]]   Warm soaks to the foot. Reviewed warning signs including fever and spreading infection. Call back in 72 hours if no improvement, sooner if worse.

## 2016-03-08 ENCOUNTER — Ambulatory Visit (HOSPITAL_COMMUNITY)
Admission: RE | Admit: 2016-03-08 | Discharge: 2016-03-08 | Disposition: A | Discharge status: Home / Self Care | Payer: 59 | Source: Ambulatory Visit | Attending: Vascular Surgery | Admitting: Vascular Surgery

## 2016-03-08 ENCOUNTER — Ambulatory Visit (HOSPITAL_COMMUNITY)
Admission: RE | Admit: 2016-03-08 | Discharge: 2016-03-08 | Disposition: A | Discharge status: Home / Self Care | Payer: 59 | Source: Ambulatory Visit

## 2016-03-08 ENCOUNTER — Ambulatory Visit (INDEPENDENT_AMBULATORY_CARE_PROVIDER_SITE_OTHER): Payer: 59 | Admitting: Vascular Surgery

## 2016-03-08 ENCOUNTER — Encounter: Payer: Self-pay | Admitting: Vascular Surgery

## 2016-03-08 VITALS — BP 139/71 | HR 56 | Resp 20 | Ht 64.5 in | Wt 238.0 lb

## 2016-03-08 DIAGNOSIS — I8390 Asymptomatic varicose veins of unspecified lower extremity: Secondary | ICD-10-CM

## 2016-03-08 DIAGNOSIS — I8312 Varicose veins of left lower extremity with inflammation: Secondary | ICD-10-CM

## 2016-03-08 DIAGNOSIS — I868 Varicose veins of other specified sites: Secondary | ICD-10-CM | POA: Diagnosis not present

## 2016-03-08 NOTE — Progress Notes (Signed)
Referring Physician: Mikey Kirschner, MD  Patient name: Jennifer Coleman MRN: [id number] DOB: 1951/03/21 Sex: female  REASON FOR CONSULT: Bleeding varicose veins  HPI: Jennifer Coleman is a 66 y.o. female, who complains of multiple varicosities primarily in the left leg which have been having episodes of bleeding. The patient has previously had laser ablation of the right and left greater saphenous by Dr. Kellie Simmering in 2008. She also had several stab phlebectomy is at that time. She has not really worn compression stockings since that time. She has had several episodes of bleeding from her varicosities near the left ankle since then in the last few months. She really does not have any symptoms otherwise. Other medical problems include hypertension which is currently controlled.  Past Medical History  Diagnosis Date  . Family history of anesthesia complication 25 yrs ago    Sister allergic to sccinocholine - unable to come off ventilator  . PVC's (premature ventricular contractions)     Caffiene, allergy medicines  . Shingles   . Hypertension   . Depression   . Varicose veins    Past Surgical History  Procedure Laterality Date  . Cervical disc surgery  1992    Fusion  . Salpingoophorectomy Left 1985  . Knee arthroscopy  09/16/2012    Procedure: ARTHROSCOPY KNEE;  Surgeon: [name], MD;  Location: [place];  Service: Orthopedics;  Laterality: Right;  Right Knee Arthroscopy  . Carpal tunnel release      Family History  Problem Relation Age of Onset  . Heart disease Sister   . Heart disease Brother     50's  . Diabetes Mellitus II Sister   . Multiple myeloma Mother   . Hypertension Sister     SOCIAL HISTORY: Social History   Social History  . Marital Status: Married    Spouse Name: N/A  . Number of Children: N/A  . Years of Education: N/A   Occupational History  . Not on file.   Social History Main Topics  . Smoking status: Former Smoker    Types: Cigarettes  .  Smokeless tobacco: Former Systems developer    Quit date: 01/12/1997  . Alcohol Use: No  . Drug Use: No  . Sexual Activity: Yes   Other Topics Concern  . Not on file   Social History Narrative    Allergies  Allergen Reactions  . Penicillins Shortness Of Breath, Swelling and Rash  . Shellfish Allergy Shortness Of Breath and Swelling  . Morphine And Related Nausea And Vomiting  . Codeine Itching    Current Outpatient Prescriptions  Medication Sig Dispense Refill  . amLODipine (NORVASC) 5 MG tablet Take 1 tablet (5 mg total) by mouth daily. 30 tablet 6  . Calcium Carbonate-Vit D-Min (GNP CALCIUM PLUS 600 +D PO) Take 1 tablet by mouth 2 (two) times daily.    . cetirizine (ZYRTEC) 10 MG tablet Take 10 mg by mouth daily as needed for allergies.     . clindamycin (CLEOCIN) 300 MG capsule Take 1 capsule (300 mg total) by mouth 3 (three) times daily. 21 capsule 0  . diclofenac sodium (VOLTAREN) 1 % GEL Apply to right knee 3 times a day as needed. 1 Tube 1  . enalapril (VASOTEC) 20 MG tablet Take 1 tablet by mouth twice a day. 180 tablet 1  . escitalopram (LEXAPRO) 20 MG tablet Take 1 tablet (20 mg total) by mouth daily. 90 tablet 1  . estradiol (ESTRACE) 0.5 MG tablet Take  0.5 mg by mouth daily.    . fluticasone (FLONASE) 50 MCG/ACT nasal spray 2 sprays as needed for allergies or rhinitis.    . hydrochlorothiazide (HYDRODIURIL) 25 MG tablet Take 1 tablet (25 mg total) by mouth daily. 30 tablet 5  . ibuprofen (ADVIL,MOTRIN) 800 MG tablet Take 1 tablet (800 mg total) by mouth 3 (three) times daily as needed. 90 tablet 2  . Multiple Vitamins-Minerals (MULTIVITAMINS THER. W/MINERALS) TABS Take 1 tablet by mouth daily.    . nitroGLYCERIN (NITROSTAT) 0.4 MG SL tablet Place 1 tablet (0.4 mg total) under the tongue every 5 (five) minutes as needed for chest pain. (Patient not taking: Reported on 03/08/2016) 30 tablet 0   No current facility-administered medications for this visit.    ROS:   General:  No  weight loss, Fever, chills  HEENT: No recent headaches, no nasal bleeding, no visual changes, no sore throat  Neurologic: No dizziness, blackouts, seizures. No recent symptoms of stroke or mini- stroke. No recent episodes of slurred speech, or temporary blindness.  Cardiac: No recent episodes of chest pain/pressure, no shortness of breath at rest.  No shortness of breath with exertion.  Denies history of atrial fibrillation or irregular heartbeat  Vascular: No history of rest pain in feet.  No history of claudication.  No history of non-healing ulcer, No history of DVT   Pulmonary: No home oxygen, no productive cough, no hemoptysis,  No asthma or wheezing  Musculoskeletal:  '[ ]'$  Arthritis, '[ ]'$  Low back pain,  '[ ]'$  Joint pain  Hematologic:No history of hypercoagulable state.  No history of easy bleeding.  No history of anemia  Gastrointestinal: No hematochezia or melena,  No gastroesophageal reflux, no trouble swallowing  Urinary: '[ ]'$  chronic Kidney disease, '[ ]'$  on HD - '[ ]'$  MWF or '[ ]'$  TTHS, '[ ]'$  Burning with urination, '[ ]'$  Frequent urination, '[ ]'$  Difficulty urinating;   Skin: No rashes  Psychological: No history of anxiety,  No history of depression   Physical Examination  Filed Vitals:   03/08/16 1211 03/08/16 1220  BP: 137/76 139/71  Pulse: 56   Resp: 20   Height: 5' 4.5" (1.638 m)   Weight: 238 lb (107.956 kg)     Body mass index is 40.24 kg/(m^2).  General:  Alert and oriented, no acute distress Skin: No rash, multiple scattered reticular and spider-type varicosities diffusely around the left and right ankles some of these blistered up at risk for bleeding Extremity Pulses:  2+ radial, brachial, femoral, dorsalis pedis, posterior tibial pulses bilaterally Musculoskeletal: No deformity or edema  Neurologic: Upper and lower extremity motor 5/5 and symmetric   ASSESSMENT:  Multiple reticular and spider-type varicosities at risk for bleeding   PLAN:  Sclerotherapy in the  next few weeks with our vein nurse. The patient will call to schedule.   Ruta Hinds, MD [facility] Office: [phone] Pager: [phone]

## 2016-03-08 NOTE — Progress Notes (Signed)
Filed Vitals:   03/08/16 1211 03/08/16 1220  BP: 137/76 139/71  Pulse: 56   Resp: 20   Height: 5' 4.5" (1.638 m)   Weight: 238 lb (107.956 kg)

## 2016-03-13 ENCOUNTER — Ambulatory Visit: Payer: 59 | Admitting: Vascular Surgery

## 2016-03-14 ENCOUNTER — Ambulatory Visit: Payer: 59 | Admitting: Family Medicine

## 2016-03-15 ENCOUNTER — Encounter: Payer: Self-pay | Admitting: Family Medicine

## 2016-03-15 ENCOUNTER — Ambulatory Visit (INDEPENDENT_AMBULATORY_CARE_PROVIDER_SITE_OTHER): Payer: 59 | Admitting: Family Medicine

## 2016-03-15 ENCOUNTER — Telehealth: Payer: Self-pay | Admitting: Family Medicine

## 2016-03-15 VITALS — BP 130/80 | Temp 98.5°F | Ht 66.0 in | Wt 238.5 lb

## 2016-03-15 DIAGNOSIS — R21 Rash and other nonspecific skin eruption: Secondary | ICD-10-CM

## 2016-03-15 MED ORDER — MUPIROCIN 2 % EX OINT: TOPICAL_OINTMENT | CUTANEOUS | Status: DC

## 2016-03-15 NOTE — Telephone Encounter (Signed)
See tonight

## 2016-03-15 NOTE — Telephone Encounter (Signed)
[facility] 03/15/16

## 2016-03-15 NOTE — Telephone Encounter (Signed)
Pt seen 5/9. Has blisters on both great toes. One has yellow drainage. Finished clindamycin on Tuesday. Pt states toes are better but nowhere near being healed. She is soaking 2-3 times a day and that does help. No fever. Some pain. Pt can come intoday but after 4pm. Pt states she has been calling every day since Monday to get an appt after 4pm. Pt also ok with antibiotic being called in to [facility]

## 2016-03-15 NOTE — Telephone Encounter (Signed)
[facility]

## 2016-03-15 NOTE — Progress Notes (Signed)
   Subjective:    Patient ID: Jennifer Coleman, female    DOB: 10/02/1951, 65 y.o.   MRN: [id number]  HPI Patient is here today for blisters on her toes. Redness, drainage and tenderness noted. Treatments tried: epsom salt soak, antibiotic. No relief. Onset several weeks ago.   See prior notes. Patient has finish complete round of antibiotics. Still has residual blister on right great toe and resolving blister on left great toe. X  No difficulty with numbness claims no new shoes claims no mechanical abrasion Review of Systems No fever no chills no headache no chest pain    Objective:   Physical Exam  Alert vital stable lungs clear heart rhythm right great toe intact Lester on medial portion left toe dorsum healing blister no evidence of persistent infection      Assessment & Plan:  Impression 2 blisters in 2 different stages of resolution. No indication for antibiotic this time. Multiple questions answered 15 minutes spent most in discussion Bactroban ointment twice a day to affected area no more oral antibiotics

## 2016-03-15 NOTE — Telephone Encounter (Signed)
Discussed with pt. Pt transferred to front to schedule ov after 4 pm today

## 2016-03-15 NOTE — Telephone Encounter (Signed)
Pt's toe is still infected and oozing pt has finished antibiotic and is wanting to know if something else can be called in. Pt wanted to schedule a follow up appt but can only be here after 4 pm. Please advise.     [facility]

## 2016-04-02 NOTE — H&P (Addendum)
Joni Fears, MD   Biagio Borg, PA-C [address], [place], [place]  [address]                             [phone]   [place] MRN:  [id number] DOB/SEX:  [birth year]/female  CHIEF COMPLAINT:  Painful right shoulder  HISTORY: [place] notes that late February she was holding on to a wheelbarrow full of mulch and it became unbalanced and it began to shift to one side and in an attempt to hold it she fell towards that right side and experienced the onset of shoulder pain.  She never had any black and blue discoloration or numbness or tingling or any obvious distortion of the appearance of her shoulder.  She has been applying ice and heat using BenGay and even ibuprofen and still is having a problem with painful overhead motion.  No numbness, no tingling.  Denies any neck pain. Cortisone injection performed on 01/25/2016 initially with some relief. An MRI was ordered.  MRI revealed a large full-thickness retracted supraspinatus tendon tear of about 17 mm.  The tear is 21 mm wide.  The infraspinatus is intact with moderate tendinopathy.  The subscap is intact.  She has moderate edema-like signal abnormality and fluid in and around the infraspinatus muscle, suggesting musculotendinous injury or maybe even a partial tear of the biceps.  Long head was intact.  She had moderate AC joint degenerative changes.  Type 1 to 2 acromion.  No significant lateral downsloping.  She had very mild glenohumeral joint degenerative changes with a small joint effusion.  No acute bony findings.   PAST MEDICAL HISTORY: Patient Active Problem List   Diagnosis Date Noted  . Epigastric pain 08/11/2015  . Chest pain, rule out acute myocardial infarction 08/11/2015  . Renal insufficiency 08/11/2015  . Venous stasis 04/13/2015  . Depression 04/13/2015  . Essential hypertension 09/12/2014  . PVCs (premature ventricular contractions) 09/07/2014  . Precordial pain 09/07/2014     Past Medical History  Diagnosis Date  . Family history of anesthesia complication 25 yrs ago    Sister allergic to sccinocholine - unable to come off ventilator  . Shingles   . Hypertension   . Depression   . Varicose veins   . PONV (postoperative nausea and vomiting)    Past Surgical History  Procedure Laterality Date  . Cervical disc surgery  1992    Fusion  . Salpingoophorectomy Left 1985  . Knee arthroscopy  09/16/2012    Procedure: ARTHROSCOPY KNEE;  Surgeon: [name], MD;  Location: [place];  Service: Orthopedics;  Laterality: Right;  Right Knee Arthroscopy  . Carpal tunnel release       MEDICATIONS:   No prescriptions prior to admission    ALLERGIES:   Allergies  Allergen Reactions  . Penicillins Shortness Of Breath, Swelling and Rash  . Shellfish Allergy Shortness Of Breath and Swelling  . Morphine And Related Nausea And Vomiting  . Codeine Itching    REVIEW OF SYSTEMS:  A comprehensive review of systems was negative except for: Cardiovascular: positive for hypertension   FAMILY HISTORY:   Family History  Problem Relation Age of Onset  . Heart disease Sister   . Heart disease Brother     64's  . Diabetes Mellitus II Sister   . Multiple myeloma Mother   . Hypertension Sister     SOCIAL HISTORY:  Social History  Substance Use Topics  . Smoking status: Former Smoker    Types: Cigarettes  . Smokeless tobacco: Former Systems developer    Quit date: 01/12/1997  . Alcohol Use: No      EXAMINATION: Vital signs in last 24 hours: Temp:  [97.6 F (36.4 C)-98 F (36.7 C)] 98 F (36.7 C) (07/04 1932) Pulse Rate:  [58-65] 64 (07/04 1932) Resp:  [16-18] 16 (07/04 1932) BP: (136-155)/(66-104) 136/73 mmHg (07/04 1932) SpO2:  [98 %-100 %] 100 % (07/04 1932)  Head is normocephalic.   Eyes:  Pupils equal, round and reactive to light and accommodation.  Extraocular intact. ENT: Ears, nose, and throat were benign.   Neck: supple, no bruits were noted.    Chest: good expansion.   Lungs: essentially clear.   Cardiac: regular rhythm and rate, normal S1, S2.  No murmurs appreciated. Pulses :  2+ bilateral and symmetric in upper extremities. Abdomen is scaphoid, soft, nontender, no masses palpable, normal bowel sounds present. CNS:  He is oriented x3 and cranial nerves II-XII grossly intact. Breast, rectal, and genital exams: not performed and not indicated for an orthopedic evaluation. Musculoskeletal: She does have weakness with external rotation, not with internal rotation.  Neurovascular exam was intact.  Positive impingement.  Positive empty can testing. No pain along the anterolateral subacromial region or at the [facility] joint.   Imaging Review MRI revealed a large full-thickness retracted supraspinatus tendon tear of about 17 mm.  The tear is 21 mm wide.  The infraspinatus is intact with moderate tendinopathy.  The subscap is intact.  She has moderate edema-like signal abnormality and fluid in and around the infraspinatus muscle, suggesting musculotendinous injury or maybe even a partial tear of the biceps.  Long head was intact.  She had moderate AC joint degenerative changes.  Type 1 to 2 acromion.  No significant lateral downsloping.  She had very mild glenohumeral joint degenerative changes with a small joint effusion.  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 Family history of anesthesia complication 25 yrs ago    Sister allergic to sccinocholine - unable to come off ventilator  . Shingles   . Hypertension   . Depression   . Varicose veins   . PONV (postoperative nausea and vomiting)     PLAN: Plan for right arthroscopic SAD and DCR and a mini-open rotator cuff tear repair  The procedure,  risks, and benefits of surgery were presented and reviewed. The risks including but not limited to infection, blood clots, vascular  and nerve injury, stiffness,  among others were discussed. The patient acknowledged the explanation, agreed to proceed.   Mike Craze [place], [place] [phone]  05/02/2016 10:58 AM

## 2016-04-03 DIAGNOSIS — H524 Presbyopia: Secondary | ICD-10-CM | POA: Diagnosis not present

## 2016-04-03 DIAGNOSIS — H43813 Vitreous degeneration, bilateral: Secondary | ICD-10-CM | POA: Diagnosis not present

## 2016-04-05 ENCOUNTER — Other Ambulatory Visit (INDEPENDENT_AMBULATORY_CARE_PROVIDER_SITE_OTHER): Payer: Self-pay | Admitting: Internal Medicine

## 2016-04-05 ENCOUNTER — Ambulatory Visit (INDEPENDENT_AMBULATORY_CARE_PROVIDER_SITE_OTHER): Payer: 59 | Admitting: Internal Medicine

## 2016-04-05 ENCOUNTER — Encounter (INDEPENDENT_AMBULATORY_CARE_PROVIDER_SITE_OTHER): Payer: Self-pay | Admitting: *Deleted

## 2016-04-05 ENCOUNTER — Other Ambulatory Visit (INDEPENDENT_AMBULATORY_CARE_PROVIDER_SITE_OTHER): Payer: Self-pay | Admitting: *Deleted

## 2016-04-05 ENCOUNTER — Encounter (INDEPENDENT_AMBULATORY_CARE_PROVIDER_SITE_OTHER): Payer: Self-pay | Admitting: Internal Medicine

## 2016-04-05 VITALS — BP 130/86 | HR 66 | Temp 98.5°F | Resp 18 | Ht 64.5 in | Wt 235.4 lb

## 2016-04-05 DIAGNOSIS — R197 Diarrhea, unspecified: Secondary | ICD-10-CM

## 2016-04-05 DIAGNOSIS — R195 Other fecal abnormalities: Secondary | ICD-10-CM

## 2016-04-05 MED ORDER — METRONIDAZOLE 500 MG PO TABS: 500.0000 mg | ORAL_TABLET | Freq: Two times a day (BID) | ORAL | Status: DC

## 2016-04-05 MED ORDER — DICYCLOMINE HCL 10 MG PO CAPS: 10.0000 mg | ORAL_CAPSULE | Freq: Three times a day (TID) | ORAL | Status: DC

## 2016-04-05 NOTE — Telephone Encounter (Signed)
Patient needs trilyte 

## 2016-04-05 NOTE — Patient Instructions (Addendum)
GI pathogen, CBC. Colonoscopy: The risks and benefits such as perforation, bleeding, and infection were reviewed with the patient and is agreeable. Am going to send an Rx for Flagyl sent to her pharmacy.

## 2016-04-05 NOTE — Progress Notes (Addendum)
Subjective:    Patient ID: Jennifer Coleman, female    DOB: Jan 06, 1951, 65 y.o.   MRN: [id number]  HPI   Presents today with c/o that last Tuesday, her abdomen was feel gassy. She had lower abdominal pain/cramps when she has BM. She is having 5-6 stools a day. Normally she has one stool. She has had symptoms for about 9 days.  She had small "squirty BMs".  There was no fever.  Saturday night she ate pasta and made her abdomen hurt. She says she is somewhat better. She says she did pass some mucous from her rectum Friday, Saturday, Sunday and Monday. If she eats a 1/2 English muffin she will have a small stool. She has not seen any blood.  She has not been on recent antibiotics in the past months. No family hx of colon cancer. She says water makes her nauseated. She can drink ginger ale or tea.       07/20/2005 Colonoscopy.  INDICATION: Jennifer Coleman is a 65 year old Caucasian female who is here for screening colonoscopy. Procedure risks were reviewed with the patient, and informed consent was obtained. Her family history is negative for colorectal carcinoma. FINAL DIAGNOSIS: 1. Sigmoid colon diverticulosis with a few more diverticula in transverse  colon. Most of the diverticula are small. 2. Small external hemorrhoids.  Review of Systems Past Medical History  Diagnosis Date  . Family history of anesthesia complication 25 yrs ago    Sister allergic to sccinocholine - unable to come off ventilator  . PVC's (premature ventricular contractions)     Caffiene, allergy medicines  . Shingles   . Hypertension   . Depression   . Varicose veins     Past Surgical History  Procedure Laterality Date  . Cervical disc surgery  1992    Fusion  . Salpingoophorectomy Left 1985  . Knee arthroscopy  09/16/2012    Procedure: ARTHROSCOPY KNEE;  Surgeon: [name], MD;  Location: [place];  Service: Orthopedics;  Laterality: Right;  Right Knee Arthroscopy    . Carpal tunnel release      Allergies  Allergen Reactions  . Penicillins Shortness Of Breath, Swelling and Rash  . Shellfish Allergy Shortness Of Breath and Swelling  . Morphine And Related Nausea And Vomiting  . Codeine Itching    Current Outpatient Prescriptions on File Prior to Visit  Medication Sig Dispense Refill  . Calcium Carbonate-Vit D-Min (GNP CALCIUM PLUS 600 +D PO) Take 1 tablet by mouth 2 (two) times daily.    . cetirizine (ZYRTEC) 10 MG tablet Take 10 mg by mouth daily as needed for allergies.     Marland Kitchen diclofenac sodium (VOLTAREN) 1 % GEL Apply to right knee 3 times a day as needed. 1 Tube 1  . enalapril (VASOTEC) 20 MG tablet Take 1 tablet by mouth twice a day. 180 tablet 1  . escitalopram (LEXAPRO) 20 MG tablet Take 1 tablet (20 mg total) by mouth daily. 90 tablet 1  . estradiol (ESTRACE) 0.5 MG tablet Take 0.5 mg by mouth daily.    . fluticasone (FLONASE) 50 MCG/ACT nasal spray 2 sprays as needed for allergies or rhinitis.    . hydrochlorothiazide (HYDRODIURIL) 25 MG tablet Take 1 tablet (25 mg total) by mouth daily. 30 tablet 5  . ibuprofen (ADVIL,MOTRIN) 800 MG tablet Take 1 tablet (800 mg total) by mouth 3 (three) times daily as needed. 90 tablet 2  . Multiple Vitamins-Minerals (MULTIVITAMINS THER. W/MINERALS) TABS Take 1 tablet by mouth daily.    Marland Kitchen  mupirocin ointment (BACTROBAN) 2 % Apply to affected area 3 times daily 22 g 0  . nitroGLYCERIN (NITROSTAT) 0.4 MG SL tablet Place 1 tablet (0.4 mg total) under the tongue every 5 (five) minutes as needed for chest pain. 30 tablet 0   No current facility-administered medications on file prior to visit.        Objective:   Physical ExamBlood pressure 130/86, pulse 66, temperature 98.5 F (36.9 C), temperature source Oral, resp. rate 18, height 5' 4.5" (1.638 m), weight 235 lb 6.4 oz (106.777 kg).  Alert and oriented. Skin warm and dry. Oral mucosa is moist.   . Sclera anicteric, conjunctivae is pink. Thyroid not  enlarged. No cervical lymphadenopathy. Lungs clear. Heart regular rate and rhythm.  Abdomen is soft. Bowel sounds are positive. No hepatomegaly. No abdominal masses felt. No tenderness.  No edema to lower extremities.        Assessment & Plan:  Stool studies, CBC. Dicyclomine 10mg  TID. Colonoscopy. The risks and benefits such as perforation, bleeding, and infection were reviewed with the patient and is agreeable. Am also going to send an Rx in for Flagyl to her pharmacy.

## 2016-04-06 LAB — CBC WITH DIFFERENTIAL/PLATELET
BASOS PCT: 1 %
Basophils Absolute: 84 cells/uL (ref 0–200)
EOS ABS: 168 {cells}/uL (ref 15–500)
Eosinophils Relative: 2 %
HEMATOCRIT: 40.6 % (ref 35.0–45.0)
HEMOGLOBIN: 13.5 g/dL (ref 11.7–15.5)
LYMPHS ABS: 2352 {cells}/uL (ref 850–3900)
Lymphocytes Relative: 28 %
MCH: 31.5 pg (ref 27.0–33.0)
MCHC: 33.3 g/dL (ref 32.0–36.0)
MCV: 94.9 fL (ref 80.0–100.0)
MONO ABS: 672 {cells}/uL (ref 200–950)
MPV: 11.6 fL (ref 7.5–12.5)
Monocytes Relative: 8 %
NEUTROS ABS: 5124 {cells}/uL (ref 1500–7800)
Neutrophils Relative %: 61 %
Platelets: 313 10*3/uL (ref 140–400)
RBC: 4.28 MIL/uL (ref 3.80–5.10)
RDW: 13.3 % (ref 11.0–15.0)
WBC: 8.4 10*3/uL (ref 3.8–10.8)

## 2016-04-09 MED ORDER — PEG 3350-KCL-NA BICARB-NACL 420 G PO SOLR: 4000.0000 mL | Freq: Once | ORAL | Status: DC

## 2016-04-10 ENCOUNTER — Telehealth (INDEPENDENT_AMBULATORY_CARE_PROVIDER_SITE_OTHER): Payer: Self-pay | Admitting: Internal Medicine

## 2016-04-10 NOTE — Telephone Encounter (Signed)
Patient called, stated that she is returning Terri's call.  Home [phone] Cell [phone]

## 2016-04-10 NOTE — Telephone Encounter (Signed)
Results have been given to patient 

## 2016-04-17 DIAGNOSIS — C44219 Basal cell carcinoma of skin of left ear and external auricular canal: Secondary | ICD-10-CM | POA: Diagnosis not present

## 2016-04-19 MED FILL — ENALAPRIL MALEATE 20 MG TAB: 20 | 90 days supply | Qty: 180 | Fill #1

## 2016-04-19 MED FILL — HYDROCHLOROTHIAZIDE 25 MG T: 25 | 90 days supply | Qty: 90 | Fill #1

## 2016-04-20 MED FILL — ESTRADIOL-NORETH 0.5-0.1 MG: 0.5-0.1 | 28 days supply | Qty: 28 | Fill #0

## 2016-04-25 ENCOUNTER — Encounter (HOSPITAL_BASED_OUTPATIENT_CLINIC_OR_DEPARTMENT_OTHER): Payer: Self-pay | Admitting: *Deleted

## 2016-04-30 ENCOUNTER — Encounter (HOSPITAL_COMMUNITY)
Admission: RE | Admit: 2016-04-30 | Discharge: 2016-04-30 | Disposition: A | Discharge status: Home / Self Care | Payer: 59 | Source: Ambulatory Visit | Attending: Orthopaedic Surgery | Admitting: Orthopaedic Surgery

## 2016-04-30 DIAGNOSIS — X500XXA Overexertion from strenuous movement or load, initial encounter: Secondary | ICD-10-CM | POA: Diagnosis not present

## 2016-04-30 DIAGNOSIS — Z79890 Hormone replacement therapy (postmenopausal): Secondary | ICD-10-CM | POA: Diagnosis not present

## 2016-04-30 DIAGNOSIS — Z87891 Personal history of nicotine dependence: Secondary | ICD-10-CM | POA: Diagnosis not present

## 2016-04-30 DIAGNOSIS — Z79899 Other long term (current) drug therapy: Secondary | ICD-10-CM | POA: Diagnosis not present

## 2016-04-30 DIAGNOSIS — F329 Major depressive disorder, single episode, unspecified: Secondary | ICD-10-CM | POA: Diagnosis not present

## 2016-04-30 DIAGNOSIS — S46011A Strain of muscle(s) and tendon(s) of the rotator cuff of right shoulder, initial encounter: Secondary | ICD-10-CM | POA: Diagnosis not present

## 2016-04-30 DIAGNOSIS — S46111A Strain of muscle, fascia and tendon of long head of biceps, right arm, initial encounter: Secondary | ICD-10-CM | POA: Diagnosis not present

## 2016-04-30 DIAGNOSIS — I10 Essential (primary) hypertension: Secondary | ICD-10-CM | POA: Diagnosis not present

## 2016-04-30 DIAGNOSIS — M7541 Impingement syndrome of right shoulder: Secondary | ICD-10-CM | POA: Diagnosis not present

## 2016-04-30 DIAGNOSIS — M19011 Primary osteoarthritis, right shoulder: Secondary | ICD-10-CM | POA: Diagnosis not present

## 2016-04-30 DIAGNOSIS — Z5333 Arthroscopic surgical procedure converted to open procedure: Secondary | ICD-10-CM | POA: Diagnosis not present

## 2016-04-30 LAB — BASIC METABOLIC PANEL
ANION GAP: 8 (ref 5–15)
BUN: 23 mg/dL — ABNORMAL HIGH (ref 6–20)
CALCIUM: 9.4 mg/dL (ref 8.9–10.3)
CO2: 27 mmol/L (ref 22–32)
Chloride: 104 mmol/L (ref 101–111)
Creatinine, Ser: 0.91 mg/dL (ref 0.44–1.00)
Glucose, Bld: 90 mg/dL (ref 65–99)
POTASSIUM: 4 mmol/L (ref 3.5–5.1)
SODIUM: 139 mmol/L (ref 135–145)

## 2016-05-01 ENCOUNTER — Emergency Department (HOSPITAL_COMMUNITY): Payer: 59

## 2016-05-01 ENCOUNTER — Emergency Department (HOSPITAL_COMMUNITY)
Admission: EM | Admit: 2016-05-01 | Discharge: 2016-05-01 | Disposition: A | Discharge status: Home / Self Care | Payer: 59 | Attending: Emergency Medicine | Admitting: Emergency Medicine

## 2016-05-01 ENCOUNTER — Encounter (HOSPITAL_COMMUNITY): Payer: Self-pay | Admitting: Emergency Medicine

## 2016-05-01 DIAGNOSIS — S8002XA Contusion of left knee, initial encounter: Secondary | ICD-10-CM | POA: Diagnosis not present

## 2016-05-01 DIAGNOSIS — Z87891 Personal history of nicotine dependence: Secondary | ICD-10-CM | POA: Diagnosis not present

## 2016-05-01 DIAGNOSIS — W0110XA Fall on same level from slipping, tripping and stumbling with subsequent striking against unspecified object, initial encounter: Secondary | ICD-10-CM | POA: Diagnosis not present

## 2016-05-01 DIAGNOSIS — T148XXA Other injury of unspecified body region, initial encounter: Secondary | ICD-10-CM

## 2016-05-01 DIAGNOSIS — S8012XA Contusion of left lower leg, initial encounter: Secondary | ICD-10-CM | POA: Insufficient documentation

## 2016-05-01 DIAGNOSIS — Y9302 Activity, running: Secondary | ICD-10-CM | POA: Diagnosis not present

## 2016-05-01 DIAGNOSIS — S0081XA Abrasion of other part of head, initial encounter: Secondary | ICD-10-CM | POA: Insufficient documentation

## 2016-05-01 DIAGNOSIS — S0033XA Contusion of nose, initial encounter: Secondary | ICD-10-CM | POA: Diagnosis not present

## 2016-05-01 DIAGNOSIS — S0083XA Contusion of other part of head, initial encounter: Secondary | ICD-10-CM | POA: Diagnosis not present

## 2016-05-01 DIAGNOSIS — S8991XA Unspecified injury of right lower leg, initial encounter: Secondary | ICD-10-CM | POA: Diagnosis present

## 2016-05-01 DIAGNOSIS — Y999 Unspecified external cause status: Secondary | ICD-10-CM | POA: Diagnosis not present

## 2016-05-01 DIAGNOSIS — Y92009 Unspecified place in unspecified non-institutional (private) residence as the place of occurrence of the external cause: Secondary | ICD-10-CM | POA: Diagnosis not present

## 2016-05-01 DIAGNOSIS — I10 Essential (primary) hypertension: Secondary | ICD-10-CM | POA: Diagnosis not present

## 2016-05-01 DIAGNOSIS — S0990XA Unspecified injury of head, initial encounter: Secondary | ICD-10-CM | POA: Insufficient documentation

## 2016-05-01 MED ORDER — ACETAMINOPHEN 500 MG PO TABS
1000.0000 mg | ORAL_TABLET | Freq: Once | ORAL | Status: AC
  Administered 2016-05-01: 1000 mg via ORAL
  Filled 2016-05-01: qty 2

## 2016-05-01 MED ORDER — KETOROLAC TROMETHAMINE 30 MG/ML IJ SOLN
30.0000 mg | Freq: Once | INTRAMUSCULAR | Status: AC
  Administered 2016-05-01: 30 mg via INTRAMUSCULAR
  Filled 2016-05-01: qty 1

## 2016-05-01 NOTE — ED Provider Notes (Signed)
CSN: [id number]     Arrival date & time 05/01/16  1652 History   First MD Initiated Contact with Patient 05/01/16 1714     Chief Complaint  Patient presents with  . Fall     (Consider location/radiation/quality/duration/timing/severity/associated sxs/prior Treatment) HPI Comments: 65 year old female with history of hypertension presents for evaluation of injuries following a fall. The patient reports that she was running out of the house and the grocery store and realized that she forgot something and turned around and ran back inside and tripped over the broad just inside the door. She says that she landed on her left knee and lower leg as well as her face. She denies loss of consciousness. She did have a mild bloody nose. She took some ibuprofen to help control the symptoms and denies any significant pain at this time. Patient states that she is up-to-date with her tetanus vaccine and just received one within the last month.  Patient is a 65 y.o. female presenting with fall.  Fall Pertinent negatives include no chest pain, no abdominal pain, no headaches and no shortness of breath.    Past Medical History  Diagnosis Date  . Family history of anesthesia complication 25 yrs ago    Sister allergic to sccinocholine - unable to come off ventilator  . Shingles   . Hypertension   . Depression   . Varicose veins   . PONV (postoperative nausea and vomiting)    Past Surgical History  Procedure Laterality Date  . Cervical disc surgery  1992    Fusion  . Salpingoophorectomy Left 1985  . Knee arthroscopy  09/16/2012    Procedure: ARTHROSCOPY KNEE;  Surgeon: [name], MD;  Location: [facility];  Service: Orthopedics;  Laterality: Right;  Right Knee Arthroscopy  . Carpal tunnel release     Family History  Problem Relation Age of Onset  . Heart disease Sister   . Heart disease Brother     62's  . Diabetes Mellitus II Sister   . Multiple myeloma Mother   . Hypertension Sister     Social History  Substance Use Topics  . Smoking status: Former Smoker    Types: Cigarettes  . Smokeless tobacco: Former Systems developer    Quit date: 01/12/1997  . Alcohol Use: No   OB History    No data available     Review of Systems  Constitutional: Negative for fever, chills and fatigue.  HENT: Positive for nosebleeds. Negative for congestion, postnasal drip, rhinorrhea and sinus pressure.   Eyes: Negative for visual disturbance.  Respiratory: Negative for cough, chest tightness and shortness of breath.   Cardiovascular: Negative for chest pain and palpitations.  Gastrointestinal: Negative for nausea, vomiting, abdominal pain and diarrhea.  Genitourinary: Negative for dysuria, urgency and hematuria.  Musculoskeletal: Positive for arthralgias (eft knee and lower leg). Negative for myalgias, back pain and neck stiffness.  Skin: Positive for color change (bruising over her bridge of the nose as well as below the left knee).  Neurological: Negative for dizziness, seizures, syncope, weakness, light-headedness and headaches.  Hematological: Does not bruise/bleed easily.      Allergies  Penicillins; Shellfish allergy; Morphine and related; and Codeine  Home Medications   Prior to Admission medications   Medication Sig Start Date End Date Taking? Authorizing Provider  Calcium Carbonate-Vit D-Min (GNP CALCIUM PLUS 600 +D PO) Take 1 tablet by mouth 2 (two) times daily.   Yes Historical Provider, MD  cetirizine (ZYRTEC) 10 MG tablet Take 10 mg  by mouth daily as needed for allergies.    Yes Historical Provider, MD  diclofenac sodium (VOLTAREN) 1 % GEL Apply to right knee 3 times a day as needed. Patient taking differently: Apply to right knee 3 times a day as needed for muscular pain 12/27/15  Yes Mikey Kirschner, MD  dicyclomine (BENTYL) 10 MG capsule Take 1 capsule by mouth 2 (two) times daily. 04/05/16  Yes Historical Provider, MD  enalapril (VASOTEC) 20 MG tablet Take 1 tablet by mouth  twice a day. 12/27/15  Yes Mikey Kirschner, MD  escitalopram (LEXAPRO) 20 MG tablet Take 1 tablet (20 mg total) by mouth daily. Patient taking differently: Take 10 mg by mouth 2 (two) times daily.  10/05/15 10/04/16 Yes Mikey Kirschner, MD  Estradiol-Norethindrone Acet 0.5-0.1 MG tablet  04/20/16  Yes Historical Provider, MD  fluticasone (FLONASE) 50 MCG/ACT nasal spray 2 sprays as needed for allergies or rhinitis.   Yes Historical Provider, MD  hydrochlorothiazide (HYDRODIURIL) 25 MG tablet Take 1 tablet (25 mg total) by mouth daily. 12/27/15  Yes Mikey Kirschner, MD  ibuprofen (ADVIL,MOTRIN) 800 MG tablet Take 1 tablet (800 mg total) by mouth 3 (three) times daily as needed. Patient taking differently: Take 800 mg by mouth 3 (three) times daily as needed for moderate pain.  09/26/15  Yes Kathyrn Drown, MD  Multiple Vitamins-Minerals (MULTIVITAMINS THER. W/MINERALS) TABS Take 1 tablet by mouth daily.   Yes Historical Provider, MD  nitroGLYCERIN (NITROSTAT) 0.4 MG SL tablet Place 1 tablet (0.4 mg total) under the tongue every 5 (five) minutes as needed for chest pain. 09/04/14  Yes Rolland Porter, MD  polyethylene glycol-electrolytes (TRILYTE) 420 g solution Take 4,000 mLs by mouth once. 04/09/16   Terri L Setzer, NP   BP 136/73 mmHg  Pulse 64  Temp(Src) 98 F (36.7 C) (Oral)  Resp 16  SpO2 100% Physical Exam  Constitutional: She is oriented to person, place, and time. She appears well-developed and well-nourished. No distress.  HENT:  Head: Normocephalic. Head is with abrasion and with contusion.    Right Ear: External ear normal. No hemotympanum.  Left Ear: External ear normal. No hemotympanum.  Nose: Nose normal. No nasal septal hematoma (patient has some bruising over the distal scan of the bilateral nostrils without any hematoma).  Mouth/Throat: Oropharynx is clear and moist. No oral lesions. No lacerations. No oropharyngeal exudate.  Eyes: EOM are normal. Pupils are equal, round, and  reactive to light.  Neck: Normal range of motion. Neck supple.  Cardiovascular: Normal rate, regular rhythm, normal heart sounds and intact distal pulses.   No murmur heard. Pulmonary/Chest: Effort normal. No respiratory distress. She has no wheezes. She has no rales. She exhibits no tenderness.  Abdominal: Soft. She exhibits no distension. There is no tenderness.  Musculoskeletal: Normal range of motion. She exhibits no edema.       Right hip: Normal.       Left hip: Normal.       Right knee: Normal.       Left knee: Normal.       Right ankle: Normal.       Cervical back: Normal.       Thoracic back: Normal.       Lumbar back: Normal.       Left lower leg: She exhibits tenderness and swelling. She exhibits no bony tenderness.       Legs:      Left foot: Normal.  Neurological: She is  alert and oriented to person, place, and time.  Skin: Skin is warm and dry. No rash noted. She is not diaphoretic.  Vitals reviewed.   ED Course  Procedures (including critical care time) Labs Review Labs Reviewed - No data to display  Imaging Review Dg Tibia/fibula Left  05/01/2016  CLINICAL DATA:  Fall striking left knee and proximal lower leg. Hematoma and bruising anteriorly. EXAM: LEFT TIBIA AND FIBULA - 2 VIEW COMPARISON:  None. FINDINGS: No acute fracture. Cortical margins of the tibia and fibula are intact. Mild degenerative change at the knee and ankle. Soft tissue edema with focal soft tissue prominence of the anterior upper lower like consistent with hematoma. No radiopaque foreign body. IMPRESSION: 1. No acute fracture of the left lower leg. 2. Focal soft tissue prominence over the anterior lower leg consistent with hematoma. Electronically Signed   By: Jeb Levering M.D.   On: 05/01/2016 18:46   Ct Head Wo Contrast  05/01/2016  CLINICAL DATA:  Fall while walking and back door, struck face on the floor. Bruising and swelling to the nose and chin. No loss of consciousness EXAM: CT HEAD  WITHOUT CONTRAST CT MAXILLOFACIAL WITHOUT CONTRAST TECHNIQUE: Multidetector CT imaging of the head and maxillofacial structures were performed using the standard protocol without intravenous contrast. Multiplanar CT image reconstructions of the maxillofacial structures were also generated. COMPARISON:  None. FINDINGS: CT HEAD FINDINGS No intracranial hemorrhage, mass effect, or midline shift. No hydrocephalus. The basilar cisterns are patent. No evidence of territorial infarct. No intracranial fluid collection. Calvarium is intact. The mastoid air cells are well aerated. CT MAXILLOFACIAL FINDINGS No facial bone fracture. The orbits and globes are intact. The nasal bone, mandibles, zygomatic arches and pterygoid plates are intact. Trace mucosal thickening of the right maxillary sinus without fluid level. Sinuses are otherwise clear. No radiopaque foreign body or localizing soft tissue abnormality. IMPRESSION: 1.  No acute intracranial abnormality. 2. No facial bone fracture. Electronically Signed   By: Jeb Levering M.D.   On: 05/01/2016 19:08   Dg Knee Complete 4 Views Left  05/01/2016  CLINICAL DATA:  Fall striking left knee and proximal lower leg. Hematoma and bruising anteriorly. EXAM: LEFT KNEE - COMPLETE 4+ VIEW COMPARISON:  None. FINDINGS: No acute fracture or dislocation. Mild tricompartmental osteoarthritis with tricompartmental peripheral spurring. There is spurring of tibial spines. No significant joint space narrowing. No joint effusion. Focal soft tissue prominence over the anterior lower leg consistent with hematoma. IMPRESSION: 1. No acute fracture or subluxation of the left knee. Mild osteoarthritis. 2. Focal soft tissue prominence over the proximal anterior lower leg consistent with hematoma in the setting of injury. Electronically Signed   By: Jeb Levering M.D.   On: 05/01/2016 18:37   Ct Maxillofacial Wo Cm  05/01/2016  CLINICAL DATA:  Fall while walking and back door, struck face on the  floor. Bruising and swelling to the nose and chin. No loss of consciousness EXAM: CT HEAD WITHOUT CONTRAST CT MAXILLOFACIAL WITHOUT CONTRAST TECHNIQUE: Multidetector CT imaging of the head and maxillofacial structures were performed using the standard protocol without intravenous contrast. Multiplanar CT image reconstructions of the maxillofacial structures were also generated. COMPARISON:  None. FINDINGS: CT HEAD FINDINGS No intracranial hemorrhage, mass effect, or midline shift. No hydrocephalus. The basilar cisterns are patent. No evidence of territorial infarct. No intracranial fluid collection. Calvarium is intact. The mastoid air cells are well aerated. CT MAXILLOFACIAL FINDINGS No facial bone fracture. The orbits and globes are intact. The nasal  bone, mandibles, zygomatic arches and pterygoid plates are intact. Trace mucosal thickening of the right maxillary sinus without fluid level. Sinuses are otherwise clear. No radiopaque foreign body or localizing soft tissue abnormality. IMPRESSION: 1.  No acute intracranial abnormality. 2. No facial bone fracture. Electronically Signed   By: Jeb Levering M.D.   On: 05/01/2016 19:08   I have personally reviewed and evaluated these images and lab results as part of my medical decision-making.   EKG Interpretation None      MDM  Patient was seen and evaluated in stable condition. Imaging without acute process other than hematoma on the lower extremity. Patient was instructed to ice and elevate the leg. She was discharged home in stable condition with instruction to follow-up with her primary care physician. She and her husband expressed understanding and agreement with plan of care. Final diagnoses:  Hematoma  Facial abrasion, initial encounter    1. Facial abrasion 2. Head injury 3. Leg hematoma    Harvel Quale, MD 05/02/16 0030

## 2016-05-01 NOTE — ED Notes (Addendum)
Pt sts left leg pain after trip and fall today; bruising and swelling noted to shin area; pt hit face; abrasion noted to nose and some bruising noted; pt denies blood thinners of LOC

## 2016-05-01 NOTE — Discharge Instructions (Signed)
You were seen and evaluated today following her fall. It appears that you just have superficial injuries with an abrasion to your nose and a hematoma on your leg. Keep your abrasions clean and you can wash them daily with regular soap and water. Do not use anything with perfumes or dyes and aunt wash them. Ice and elevate your leg to help with the swelling. Follow-up with her primary care physician for reevaluation if your symptoms persist.  Head Injury, Adult You have a head injury. Headaches and throwing up (vomiting) are common after a head injury. It should be easy to wake up from sleeping. Sometimes you must stay in the hospital. Most problems happen within the first 24 hours. Side effects may occur up to 7-10 days after the injury.  WHAT ARE THE TYPES OF HEAD INJURIES? Head injuries can be as minor as a bump. Some head injuries can be more severe. More severe head injuries include:  A jarring injury to the brain (concussion).  A bruise of the brain (contusion). This mean there is bleeding in the brain that can cause swelling.  A cracked skull (skull fracture).  Bleeding in the brain that collects, clots, and forms a bump (hematoma). WHEN SHOULD I GET HELP RIGHT AWAY?   You are confused or sleepy.  You cannot be woken up.  You feel sick to your stomach (nauseous) or keep throwing up (vomiting).  Your dizziness or unsteadiness is getting worse.  You have very bad, lasting headaches that are not helped by medicine. Take medicines only as told by your doctor.  You cannot use your arms or legs like normal.  You cannot walk.  You notice changes in the black spots in the center of the colored part of your eye (pupil).  You have clear or bloody fluid coming from your nose or ears.  You have trouble seeing. During the next 24 hours after the injury, you must stay with someone who can watch you. This person should get help right away (call 911 in the [place] if you start to shake and are  not able to control it (have seizures), you pass out, or you are unable to wake up. HOW CAN I PREVENT A HEAD INJURY IN THE FUTURE?  Wear seat belts.  Wear a helmet while bike riding and playing sports like football.  Stay away from dangerous activities around the house. WHEN CAN I RETURN TO NORMAL ACTIVITIES AND ATHLETICS? See your doctor before doing these activities. You should not do normal activities or play contact sports until 1 week after the following symptoms have stopped:  Headache that does not go away.  Dizziness.  Poor attention.  Confusion.  Memory problems.  Sickness to your stomach or throwing up.  Tiredness.  Fussiness.  Bothered by bright lights or loud noises.  Anxiousness or depression.  Restless sleep. MAKE SURE YOU:   Understand these instructions.  Will watch your condition.  Will get help right away if you are not doing well or get worse.   This information is not intended to replace advice given to you by your health care provider. Make sure you discuss any questions you have with your health care provider.   Document Released: 09/27/2008 Document Revised: 11/05/2014 Document Reviewed: 06/22/2013 Elsevier Interactive Patient Education 2016 [place] An abrasion is a cut or scrape on the outer surface of your skin. An abrasion does not extend through all of the layers of your skin. It is important to care  for your abrasion properly to prevent infection. CAUSES Most abrasions are caused by falling on or gliding across the ground or another surface. When your skin rubs on something, the outer and inner layer of skin rubs off.  SYMPTOMS A cut or scrape is the main symptom of this condition. The scrape may be bleeding, or it may appear red or pink. If there was an associated fall, there may be an underlying bruise. DIAGNOSIS An abrasion is diagnosed with a physical exam. TREATMENT Treatment for this condition depends on how  large and deep the abrasion is. Usually, your abrasion will be cleaned with water and mild soap. This removes any dirt or debris that may be stuck. An antibiotic ointment may be applied to the abrasion to help prevent infection. A bandage (dressing) may be placed on the abrasion to keep it clean. You may also need a tetanus shot. HOME CARE INSTRUCTIONS Medicines  Take or apply medicines only as directed by your health care provider.  If you were prescribed an antibiotic ointment, finish all of it even if you start to feel better. Wound Care  Clean the wound with mild soap and water 2-3 times per day or as directed by your health care provider. Pat your wound dry with a clean towel. Do not rub it.  There are many different ways to close and cover a wound. Follow instructions from your health care provider about:  Wound care.  Dressing changes and removal.  Check your wound every day for signs of infection. Watch for:  Redness, swelling, or pain.  Fluid, blood, or pus. General Instructions  Keep the dressing dry as directed by your health care provider. Do not take baths, swim, use a hot tub, or do anything that would put your wound underwater until your health care provider approves.  If there is swelling, raise (elevate) the injured area above the level of your heart while you are sitting or lying down.  Keep all follow-up visits as directed by your health care provider. This is important. SEEK MEDICAL CARE IF:  You received a tetanus shot and you have swelling, severe pain, redness, or bleeding at the injection site.  Your pain is not controlled with medicine.  You have increased redness, swelling, or pain at the site of your wound. SEEK IMMEDIATE MEDICAL CARE IF:  You have a red streak going away from your wound.  You have a fever.  You have fluid, blood, or pus coming from your wound.  You notice a bad smell coming from your wound or your dressing.   This information  is not intended to replace advice given to you by your health care provider. Make sure you discuss any questions you have with your health care provider.   Document Released: 07/25/2005 Document Revised: 07/06/2015 Document Reviewed: 10/13/2014 Elsevier Interactive Patient Education 2016 [employer]  Hematoma A hematoma is a collection of blood under the skin, in an organ, in a body space, in a joint space, or in other tissue. The blood can clot to form a lump that you can see and feel. The lump is often firm and may sometimes become sore and tender. Most hematomas get better in a few days to weeks. However, some hematomas may be serious and require medical care. Hematomas can range in size from very small to very large. CAUSES  A hematoma can be caused by a blunt or penetrating injury. It can also be caused by spontaneous leakage from a blood vessel under  the skin. Spontaneous leakage from a blood vessel is more likely to occur in older people, especially those taking blood thinners. Sometimes, a hematoma can develop after certain medical procedures. SIGNS AND SYMPTOMS   A firm lump on the body.  Possible pain and tenderness in the area.  Bruising.Blue, dark blue, purple-red, or yellowish skin may appear at the site of the hematoma if the hematoma is close to the surface of the skin. For hematomas in deeper tissues or body spaces, the signs and symptoms may be subtle. For example, an intra-abdominal hematoma may cause abdominal pain, weakness, fainting, and shortness of breath. An intracranial hematoma may cause a headache or symptoms such as weakness, trouble speaking, or a change in consciousness. DIAGNOSIS  A hematoma can usually be diagnosed based on your medical history and a physical exam. Imaging tests may be needed if your health care provider suspects a hematoma in deeper tissues or body spaces, such as the abdomen, head, or chest. These tests may include ultrasonography or a CT scan.   TREATMENT  Hematomas usually go away on their own over time. Rarely does the blood need to be drained out of the body. Large hematomas or those that may affect vital organs will sometimes need surgical drainage or monitoring. HOME CARE INSTRUCTIONS   Apply ice to the injured area:   Put ice in a plastic bag.   Place a towel between your skin and the bag.   Leave the ice on for 20 minutes, 2-3 times a day for the first 1 to 2 days.   After the first 2 days, switch to using warm compresses on the hematoma.   Elevate the injured area to help decrease pain and swelling. Wrapping the area with an elastic bandage may also be helpful. Compression helps to reduce swelling and promotes shrinking of the hematoma. Make sure the bandage is not wrapped too tight.   If your hematoma is on a lower extremity and is painful, crutches may be helpful for a couple days.   Only take over-the-counter or prescription medicines as directed by your health care provider. SEEK IMMEDIATE MEDICAL CARE IF:   You have increasing pain, or your pain is not controlled with medicine.   You have a fever.   You have worsening swelling or discoloration.   Your skin over the hematoma breaks or starts bleeding.   Your hematoma is in your chest or abdomen and you have weakness, shortness of breath, or a change in consciousness.  Your hematoma is on your scalp (caused by a fall or injury) and you have a worsening headache or a change in alertness or consciousness. MAKE SURE YOU:   Understand these instructions.  Will watch your condition.  Will get help right away if you are not doing well or get worse.   This information is not intended to replace advice given to you by your health care provider. Make sure you discuss any questions you have with your health care provider.   Document Released: 05/29/2004 Document Revised: 06/17/2013 Document Reviewed: 03/25/2013 Elsevier Interactive Patient Education  [employer].

## 2016-05-01 NOTE — ED Notes (Signed)
Pt verbalized understanding of d/c instructions and has no further questions. Pt stable and NAD. Pt wheeled out in wheelchair. Pt took all belongs home.

## 2016-05-03 ENCOUNTER — Ambulatory Visit (HOSPITAL_BASED_OUTPATIENT_CLINIC_OR_DEPARTMENT_OTHER)
Admission: RE | Admit: 2016-05-03 | Discharge: 2016-05-03 | Disposition: A | Discharge status: Home / Self Care | Payer: 59 | Source: Ambulatory Visit | Attending: Orthopaedic Surgery | Admitting: Orthopaedic Surgery

## 2016-05-03 ENCOUNTER — Ambulatory Visit (HOSPITAL_BASED_OUTPATIENT_CLINIC_OR_DEPARTMENT_OTHER): Payer: 59 | Admitting: Anesthesiology

## 2016-05-03 ENCOUNTER — Encounter (HOSPITAL_BASED_OUTPATIENT_CLINIC_OR_DEPARTMENT_OTHER): Payer: Self-pay | Admitting: Anesthesiology

## 2016-05-03 ENCOUNTER — Encounter (HOSPITAL_BASED_OUTPATIENT_CLINIC_OR_DEPARTMENT_OTHER)
Admission: RE | Disposition: A | Discharge status: Home / Self Care | Payer: Self-pay | Source: Ambulatory Visit | Attending: Orthopaedic Surgery

## 2016-05-03 DIAGNOSIS — X500XXA Overexertion from strenuous movement or load, initial encounter: Secondary | ICD-10-CM | POA: Insufficient documentation

## 2016-05-03 DIAGNOSIS — Z5333 Arthroscopic surgical procedure converted to open procedure: Secondary | ICD-10-CM | POA: Insufficient documentation

## 2016-05-03 DIAGNOSIS — M7541 Impingement syndrome of right shoulder: Secondary | ICD-10-CM | POA: Insufficient documentation

## 2016-05-03 DIAGNOSIS — M19011 Primary osteoarthritis, right shoulder: Secondary | ICD-10-CM | POA: Insufficient documentation

## 2016-05-03 DIAGNOSIS — Z87891 Personal history of nicotine dependence: Secondary | ICD-10-CM | POA: Insufficient documentation

## 2016-05-03 DIAGNOSIS — I10 Essential (primary) hypertension: Secondary | ICD-10-CM | POA: Diagnosis not present

## 2016-05-03 DIAGNOSIS — Z79899 Other long term (current) drug therapy: Secondary | ICD-10-CM | POA: Insufficient documentation

## 2016-05-03 DIAGNOSIS — Z79890 Hormone replacement therapy (postmenopausal): Secondary | ICD-10-CM | POA: Diagnosis not present

## 2016-05-03 DIAGNOSIS — F329 Major depressive disorder, single episode, unspecified: Secondary | ICD-10-CM | POA: Insufficient documentation

## 2016-05-03 DIAGNOSIS — S46111A Strain of muscle, fascia and tendon of long head of biceps, right arm, initial encounter: Secondary | ICD-10-CM | POA: Diagnosis not present

## 2016-05-03 DIAGNOSIS — M75101 Unspecified rotator cuff tear or rupture of right shoulder, not specified as traumatic: Secondary | ICD-10-CM | POA: Diagnosis not present

## 2016-05-03 DIAGNOSIS — S46212A Strain of muscle, fascia and tendon of other parts of biceps, left arm, initial encounter: Secondary | ICD-10-CM | POA: Diagnosis not present

## 2016-05-03 DIAGNOSIS — S46011A Strain of muscle(s) and tendon(s) of the rotator cuff of right shoulder, initial encounter: Secondary | ICD-10-CM | POA: Insufficient documentation

## 2016-05-03 DIAGNOSIS — M25511 Pain in right shoulder: Secondary | ICD-10-CM | POA: Diagnosis not present

## 2016-05-03 DIAGNOSIS — G8918 Other acute postprocedural pain: Secondary | ICD-10-CM | POA: Diagnosis not present

## 2016-05-03 DIAGNOSIS — M75121 Complete rotator cuff tear or rupture of right shoulder, not specified as traumatic: Secondary | ICD-10-CM | POA: Diagnosis not present

## 2016-05-03 HISTORY — PX: SHOULDER ARTHROSCOPY WITH OPEN ROTATOR CUFF REPAIR AND DISTAL CLAVICLE ACROMINECTOMY: SHX5683

## 2016-05-03 SURGERY — SHOULDER ARTHROSCOPY WITH OPEN ROTATOR CUFF REPAIR AND DISTAL CLAVICLE ACROMINECTOMY
Anesthesia: General | Site: Shoulder | Laterality: Right

## 2016-05-03 MED ORDER — FENTANYL CITRATE (PF) 100 MCG/2ML IJ SOLN
INTRAMUSCULAR | Status: AC
  Filled 2016-05-03: qty 2

## 2016-05-03 MED ORDER — EPHEDRINE 5 MG/ML INJ
INTRAVENOUS | Status: AC
  Filled 2016-05-03: qty 10

## 2016-05-03 MED ORDER — METOCLOPRAMIDE HCL 5 MG/ML IJ SOLN: 10.0000 mg | Freq: Once | INTRAMUSCULAR | Status: DC | PRN

## 2016-05-03 MED ORDER — DEXAMETHASONE SODIUM PHOSPHATE 10 MG/ML IJ SOLN
INTRAMUSCULAR | Status: AC
  Filled 2016-05-03: qty 1

## 2016-05-03 MED ORDER — ONDANSETRON HCL 4 MG/2ML IJ SOLN
INTRAMUSCULAR | Status: AC
  Filled 2016-05-03: qty 2

## 2016-05-03 MED ORDER — PHENYLEPHRINE HCL 10 MG/ML IJ SOLN
INTRAMUSCULAR | Status: DC | PRN
  Administered 2016-05-03: 40 ug via INTRAVENOUS

## 2016-05-03 MED ORDER — OXYCODONE-ACETAMINOPHEN 5-325 MG PO TABS: 1.0000 | ORAL_TABLET | ORAL | Status: DC | PRN

## 2016-05-03 MED ORDER — VANCOMYCIN HCL IN DEXTROSE 1-5 GM/200ML-% IV SOLN
INTRAVENOUS | Status: AC
  Filled 2016-05-03: qty 200

## 2016-05-03 MED ORDER — GLYCOPYRROLATE 0.2 MG/ML IJ SOLN
0.2000 mg | Freq: Once | INTRAMUSCULAR | Status: AC | PRN
  Administered 2016-05-03: .4 mg via INTRAVENOUS

## 2016-05-03 MED ORDER — FENTANYL CITRATE (PF) 100 MCG/2ML IJ SOLN: 25.0000 ug | INTRAMUSCULAR | Status: DC | PRN

## 2016-05-03 MED ORDER — ACETAMINOPHEN 10 MG/ML IV SOLN
1000.0000 mg | Freq: Once | INTRAVENOUS | Status: AC
  Administered 2016-05-03: 1000 mg via INTRAVENOUS

## 2016-05-03 MED ORDER — VANCOMYCIN HCL IN DEXTROSE 500-5 MG/100ML-% IV SOLN
INTRAVENOUS | Status: AC
  Filled 2016-05-03: qty 100

## 2016-05-03 MED ORDER — NEOSTIGMINE METHYLSULFATE 10 MG/10ML IV SOLN
INTRAVENOUS | Status: DC | PRN
  Administered 2016-05-03: 2 mg via INTRAVENOUS

## 2016-05-03 MED ORDER — MIDAZOLAM HCL 2 MG/2ML IJ SOLN
INTRAMUSCULAR | Status: AC
  Filled 2016-05-03: qty 2

## 2016-05-03 MED ORDER — LACTATED RINGERS IV SOLN
INTRAVENOUS | Status: DC
  Administered 2016-05-03 (×3): via INTRAVENOUS

## 2016-05-03 MED ORDER — SUGAMMADEX SODIUM 200 MG/2ML IV SOLN
INTRAVENOUS | Status: AC
  Filled 2016-05-03: qty 2

## 2016-05-03 MED ORDER — ACETAMINOPHEN 10 MG/ML IV SOLN
INTRAVENOUS | Status: AC
  Filled 2016-05-03: qty 100

## 2016-05-03 MED ORDER — ROCURONIUM BROMIDE 100 MG/10ML IV SOLN
INTRAVENOUS | Status: DC | PRN
  Administered 2016-05-03: 40 mg via INTRAVENOUS

## 2016-05-03 MED ORDER — SCOPOLAMINE 1 MG/3DAYS TD PT72
1.0000 | MEDICATED_PATCH | Freq: Once | TRANSDERMAL | Status: DC | PRN
  Administered 2016-05-03: 1.5 mg via TRANSDERMAL

## 2016-05-03 MED ORDER — FENTANYL CITRATE (PF) 100 MCG/2ML IJ SOLN
50.0000 ug | INTRAMUSCULAR | Status: DC | PRN
  Administered 2016-05-03: 100 ug via INTRAVENOUS

## 2016-05-03 MED ORDER — LIDOCAINE HCL (CARDIAC) 20 MG/ML IV SOLN
INTRAVENOUS | Status: DC | PRN
  Administered 2016-05-03: 50 mg via INTRAVENOUS

## 2016-05-03 MED ORDER — PROPOFOL 10 MG/ML IV BOLUS
INTRAVENOUS | Status: DC | PRN
  Administered 2016-05-03: 200 mg via INTRAVENOUS

## 2016-05-03 MED ORDER — PROPOFOL 10 MG/ML IV BOLUS
INTRAVENOUS | Status: AC
  Filled 2016-05-03: qty 20

## 2016-05-03 MED ORDER — DEXTROSE 5 % IV SOLN
10.0000 mg | INTRAVENOUS | Status: DC | PRN
  Administered 2016-05-03: 40 ug/min via INTRAVENOUS

## 2016-05-03 MED ORDER — EPHEDRINE SULFATE-NACL 50-0.9 MG/10ML-% IV SOSY
PREFILLED_SYRINGE | INTRAVENOUS | Status: DC | PRN
Start: 2016-05-03 — End: 2016-05-03
  Administered 2016-05-03 (×5): 10 mg via INTRAVENOUS

## 2016-05-03 MED ORDER — LIDOCAINE 2% (20 MG/ML) 5 ML SYRINGE
INTRAMUSCULAR | Status: AC
  Filled 2016-05-03: qty 5

## 2016-05-03 MED ORDER — MEPERIDINE HCL 25 MG/ML IJ SOLN: 6.2500 mg | INTRAMUSCULAR | Status: DC | PRN

## 2016-05-03 MED ORDER — SCOPOLAMINE 1 MG/3DAYS TD PT72
MEDICATED_PATCH | TRANSDERMAL | Status: AC
  Filled 2016-05-03: qty 1

## 2016-05-03 MED ORDER — MIDAZOLAM HCL 2 MG/2ML IJ SOLN
1.0000 mg | INTRAMUSCULAR | Status: DC | PRN
  Administered 2016-05-03: 1 mg via INTRAVENOUS

## 2016-05-03 MED ORDER — BUPIVACAINE-EPINEPHRINE (PF) 0.5% -1:200000 IJ SOLN
INTRAMUSCULAR | Status: DC | PRN
  Administered 2016-05-03: 30 mL via PERINEURAL

## 2016-05-03 MED ORDER — SODIUM CHLORIDE 0.9 % IR SOLN
Status: DC | PRN
  Administered 2016-05-03: 9000 mL

## 2016-05-03 MED ORDER — CHLORHEXIDINE GLUCONATE 4 % EX LIQD: 60.0000 mL | Freq: Once | CUTANEOUS | Status: DC

## 2016-05-03 MED ORDER — VANCOMYCIN HCL 10 G IV SOLR
1500.0000 mg | INTRAVENOUS | Status: AC
  Administered 2016-05-03: 1500 mg via INTRAVENOUS

## 2016-05-03 MED ORDER — BUPIVACAINE-EPINEPHRINE (PF) 0.25% -1:200000 IJ SOLN
INTRAMUSCULAR | Status: AC
  Filled 2016-05-03: qty 30

## 2016-05-03 MED ORDER — SODIUM CHLORIDE 0.9 % IV SOLN: 75.0000 mL/h | INTRAVENOUS | Status: DC

## 2016-05-03 SURGICAL SUPPLY — 90 items
ANCH SUT KNTLS STRL SHLDR SYS (Anchor) ×1 IMPLANT
ANCHOR ALLTHRD KNTLS PEEK 5.5 (Anchor) ×1 IMPLANT
ANCHOR PEEK ALL THREAD (Anchor) ×3 IMPLANT
ANCHOR SUT QUATTRO KNTLS 4.5 (Anchor) ×1 IMPLANT
APL SKNCLS STERI-STRIP NONHPOA (GAUZE/BANDAGES/DRESSINGS) ×1
BAG DECANTER FOR FLEXI CONT (MISCELLANEOUS) IMPLANT
BENZOIN TINCTURE PRP APPL 2/3 (GAUZE/BANDAGES/DRESSINGS) ×1 IMPLANT
BLADE 4.2CUDA (BLADE) ×2 IMPLANT
BLADE AVERAGE 25X9 (BLADE) IMPLANT
BLADE CUDA 5.5 (BLADE) IMPLANT
BLADE GREAT WHITE 4.2 (BLADE) IMPLANT
BLADE SURG 15 STRL LF DISP TIS (BLADE) IMPLANT
BLADE SURG 15 STRL SS (BLADE) ×2
BUR OVAL 6.0 (BURR) ×2 IMPLANT
CANNULA 5.75X71 LONG (CANNULA) ×1 IMPLANT
CANNULA ACUFLEX KIT 5X76 (CANNULA) ×2 IMPLANT
CANNULA TWIST IN 8.25X7CM (CANNULA) ×1 IMPLANT
CLEANER CAUTERY TIP 5X5 PAD (MISCELLANEOUS) IMPLANT
CLSR STERI-STRIP ANTIMIC 1/2X4 (GAUZE/BANDAGES/DRESSINGS) ×1 IMPLANT
CUTTER MENISCUS  4.2MM (BLADE)
CUTTER MENISCUS 4.2MM (BLADE) IMPLANT
DECANTER SPIKE VIAL GLASS SM (MISCELLANEOUS) IMPLANT
DRAPE IMP U-DRAPE 54X76 (DRAPES) ×1 IMPLANT
DRAPE SHOULDER BEACH CHAIR (DRAPES) ×2 IMPLANT
DRAPE SURG 17X23 STRL (DRAPES) ×2 IMPLANT
DRAPE U-SHAPE 47X51 STRL (DRAPES) ×2 IMPLANT
DRSG EMULSION OIL 3X3 NADH (GAUZE/BANDAGES/DRESSINGS) ×2 IMPLANT
DRSG PAD ABDOMINAL 8X10 ST (GAUZE/BANDAGES/DRESSINGS) ×2 IMPLANT
DURAPREP 26ML APPLICATOR (WOUND CARE) ×2 IMPLANT
ELECT NDL TIP 2.8 STRL (NEEDLE) IMPLANT
ELECT NEEDLE TIP 2.8 STRL (NEEDLE) ×2 IMPLANT
ELECT REM PT RETURN 9FT ADLT (ELECTROSURGICAL) ×2
ELECTRODE REM PT RTRN 9FT ADLT (ELECTROSURGICAL) ×1 IMPLANT
GAUZE SPONGE 4X4 12PLY STRL (GAUZE/BANDAGES/DRESSINGS) ×2 IMPLANT
GAUZE SPONGE 4X4 16PLY XRAY LF (GAUZE/BANDAGES/DRESSINGS) IMPLANT
GLOVE BIOGEL M STRL SZ7.5 (GLOVE) ×1 IMPLANT
GLOVE BIOGEL PI IND STRL 7.0 (GLOVE) IMPLANT
GLOVE BIOGEL PI IND STRL 8 (GLOVE) ×1 IMPLANT
GLOVE BIOGEL PI IND STRL 8.5 (GLOVE) IMPLANT
GLOVE BIOGEL PI INDICATOR 7.0 (GLOVE) ×2
GLOVE BIOGEL PI INDICATOR 8 (GLOVE) ×2
GLOVE BIOGEL PI INDICATOR 8.5 (GLOVE) ×2
GLOVE ECLIPSE 6.5 STRL STRAW (GLOVE) ×1 IMPLANT
GLOVE ECLIPSE 8.0 STRL XLNG CF (GLOVE) ×4 IMPLANT
GOWN STRL REUS W/ TWL LRG LVL3 (GOWN DISPOSABLE) ×1 IMPLANT
GOWN STRL REUS W/ TWL XL LVL3 (GOWN DISPOSABLE) ×1 IMPLANT
GOWN STRL REUS W/TWL 2XL LVL3 (GOWN DISPOSABLE) IMPLANT
GOWN STRL REUS W/TWL LRG LVL3 (GOWN DISPOSABLE) ×2
GOWN STRL REUS W/TWL XL LVL3 (GOWN DISPOSABLE) ×2
MANIFOLD NEPTUNE II (INSTRUMENTS) ×1 IMPLANT
NDL 1/2 CIR CATGUT .05X1.09 (NEEDLE) IMPLANT
NDL SCORPION MULTI FIRE (NEEDLE) IMPLANT
NEEDLE 1/2 CIR CATGUT .05X1.09 (NEEDLE) IMPLANT
NEEDLE SCORPION MULTI FIRE (NEEDLE) IMPLANT
NS IRRIG 1000ML POUR BTL (IV SOLUTION) ×1 IMPLANT
PACK ARTHROSCOPY DSU (CUSTOM PROCEDURE TRAY) ×2 IMPLANT
PACK BASIN DAY SURGERY FS (CUSTOM PROCEDURE TRAY) ×2 IMPLANT
PAD CLEANER CAUTERY TIP 5X5 (MISCELLANEOUS)
PENCIL BUTTON HOLSTER BLD 10FT (ELECTRODE) ×1 IMPLANT
SET ARTHROSCOPY TUBING (MISCELLANEOUS) ×2
SET ARTHROSCOPY TUBING LN (MISCELLANEOUS) ×1 IMPLANT
SLEEVE SCD COMPRESS KNEE MED (MISCELLANEOUS) ×2 IMPLANT
SLING ARM FOAM STRAP LRG (SOFTGOODS) IMPLANT
SLING ARM FOAM STRAP XLG (SOFTGOODS) ×1 IMPLANT
SLING ARM IMMOBILIZER LRG (SOFTGOODS) IMPLANT
SLING ARM MED ADULT FOAM STRAP (SOFTGOODS) IMPLANT
SPONGE GAUZE 4X4 12PLY STER LF (GAUZE/BANDAGES/DRESSINGS) ×1 IMPLANT
SPONGE LAP 4X18 X RAY DECT (DISPOSABLE) ×1 IMPLANT
STAPLER VISISTAT 35W (STAPLE) IMPLANT
STRIP CLOSURE SKIN 1/2X4 (GAUZE/BANDAGES/DRESSINGS) ×1 IMPLANT
SUCTION FRAZIER HANDLE 10FR (MISCELLANEOUS)
SUCTION TUBE FRAZIER 10FR DISP (MISCELLANEOUS) IMPLANT
SUT BONE WAX W31G (SUTURE) IMPLANT
SUT ETHIBOND 2-0 V-5 NDL (SUTURE) IMPLANT
SUT ETHIBOND 2-0 V-5 NEEDLE (SUTURE) ×2 IMPLANT
SUT ETHILON 3 0 PS 1 (SUTURE) IMPLANT
SUT ETHILON 4 0 PS 2 18 (SUTURE) IMPLANT
SUT PROLENE 3 0 PS 2 (SUTURE) IMPLANT
SUT VIC AB 0 CT1 27 (SUTURE) ×2
SUT VIC AB 0 CT1 27XBRD ANBCTR (SUTURE) IMPLANT
SUT VIC AB 0 SH 27 (SUTURE) ×2 IMPLANT
SUT VIC AB 2-0 SH 27 (SUTURE)
SUT VIC AB 2-0 SH 27XBRD (SUTURE) IMPLANT
SUT VIC AB 3-0 SH 27 (SUTURE)
SUT VIC AB 3-0 SH 27X BRD (SUTURE) IMPLANT
SYR BULB 3OZ (MISCELLANEOUS) IMPLANT
TOWEL OR 17X24 6PK STRL BLUE (TOWEL DISPOSABLE) ×2 IMPLANT
WAND STAR VAC 90 (SURGICAL WAND) ×2 IMPLANT
WATER STERILE IRR 1000ML POUR (IV SOLUTION) ×2 IMPLANT
YANKAUER SUCT BULB TIP NO VENT (SUCTIONS) ×1 IMPLANT

## 2016-05-03 NOTE — Op Note (Signed)
PATIENT ID:      Jennifer Coleman  MRN:     [id number] DOB/AGE:    07/14/1951 / 65 y.o.       OPERATIVE REPORT    DATE OF PROCEDURE:  05/03/2016       PREOPERATIVE DIAGNOSIS:   RIGHT SHOULDER ROTATOR CUFF TEAR WITH IMPINGEMENT, DEGENERATIVE JOINT DISEASE OF ACROMIOCLAVICULAR JOINT.                                                       Estimated body mass index is 40.41 kg/(m^2) as calculated from the following:   Height as of this encounter: 5' 4.5" (1.638 m).   Weight as of this encounter: 108.41 kg (239 lb).     POSTOPERATIVE DIAGNOSIS:   RIGHT SHOULDER ROTATOR CUFF TEAR WITH IMPINGEMENT, DEGENERATIVE JOINT DISEASE OF ACROMIOCLAVICULAR JOINT, TEAR BICEPS TENDON.                                                                     Estimated body mass index is 40.41 kg/(m^2) as calculated from the following:   Height as of this encounter: 5' 4.5" (1.638 m).   Weight as of this encounter: 108.41 kg (239 lb).     PROCEDURE:  Procedure(s): RIGHT SHOULDER ARTHROSCOPY WITH MINI-OPEN ROTATOR CUFF REPAIR,DISTAL CLAVICLE RESECTION, SUBACROMIAL DECOMPRESSION, BICEPS TENODESIS     SURGEON:  Joni Fears, MD    ASSISTANT:   Biagio Borg, PA-C   (Present and scrubbed throughout the case, critical for assistance with exposure, retraction, instrumentation, and closure.)          ANESTHESIA: regional and general     DRAINS: none :      TOURNIQUET TIME: * No tourniquets in log *    COMPLICATIONS:  None   CONDITION:  stable  PROCEDURE IN DETAIL: [place], [name] W 05/03/2016, 3:04 PM

## 2016-05-03 NOTE — Anesthesia Preprocedure Evaluation (Signed)
Anesthesia Evaluation  Patient identified by MRN, date of birth, ID band Patient awake    Reviewed: Allergy & Precautions, NPO status , Patient's Chart, lab work & pertinent test results  History of Anesthesia Complications (+) PONV, Family history of anesthesia reaction and history of anesthetic complications  Airway Mallampati: II  TM Distance: >3 FB Neck ROM: Full    Dental no notable dental hx. (+) Teeth Intact   Pulmonary former smoker,    Pulmonary exam normal breath sounds clear to auscultation       Cardiovascular hypertension, Pt. on medications Normal cardiovascular exam Rhythm:Regular Rate:Normal     Neuro/Psych PSYCHIATRIC DISORDERS Depression negative neurological ROS     GI/Hepatic negative GI ROS, Neg liver ROS,   Endo/Other  negative endocrine ROS  Renal/GU Renal InsufficiencyRenal disease  negative genitourinary   Musculoskeletal Right shoulder rotator cuff tear with impingement Right AC arthrosis   Abdominal   Peds  Hematology negative hematology ROS (+)   Anesthesia Other Findings   Reproductive/Obstetrics negative OB ROS                             Anesthesia Physical Anesthesia Plan  ASA: II  Anesthesia Plan: General   Post-op Pain Management: GA combined w/ Regional for post-op pain   Induction: Intravenous  Airway Management Planned: LMA and Oral ETT  Additional Equipment:   Intra-op Plan:   Post-operative Plan: Extubation in OR  Informed Consent: I have reviewed the patients History and Physical, chart, labs and discussed the procedure including the risks, benefits and alternatives for the proposed anesthesia with the patient or authorized representative who has indicated his/her understanding and acceptance.   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 No succinylcholine)        Anesthesia Quick Evaluation

## 2016-05-03 NOTE — Transfer of Care (Signed)
Immediate Anesthesia Transfer of Care Note  Patient: Jennifer Coleman  Procedure(s) Performed: Procedure(s): RIGHT SHOULDER ARTHROSCOPY WITH MINI-OPEN ROTATOR CUFF REPAIR,DISTAL CLAVICLE RESECTION, SUBACROMIAL DECOMPRESSION. (Right)  Patient Location: PACU  Anesthesia Type:GA combined with regional for post-op pain  Level of Consciousness: awake, alert  and oriented  Airway & Oxygen Therapy: Patient Spontanous Breathing and Patient connected to face mask oxygen  Post-op Assessment: Report given to RN and Post -op Vital signs reviewed and stable  Post vital signs: Reviewed and stable  Last Vitals:  Filed Vitals:   05/03/16 1245 05/03/16 1250  BP: 104/55   Pulse: 72 74  Temp:    Resp: 20 15    Last Pain:  Filed Vitals:   05/03/16 1250  PainSc: 6          Complications: No apparent anesthesia complications

## 2016-05-03 NOTE — Anesthesia Procedure Notes (Addendum)
Anesthesia Regional Block:  Supraclavicular block  Pre-Anesthetic Checklist: ,, timeout performed, Correct Patient, Correct Site, Correct Laterality, Correct Procedure, Correct Position, site marked, Risks and benefits discussed,  Surgical consent,  Pre-op evaluation,  At surgeon's request and post-op pain management  Laterality: Right and Upper  Prep: chloraprep       Needles:  Injection technique: Single-shot     Needle Length: 9cm 9 cm Needle Gauge: 21 and 21 G  Needle insertion depth: 4 cm   Additional Needles:  Procedures: ultrasound guided (picture in chart) Supraclavicular block Narrative:  Start time: 05/03/2016 12:35 PM End time: 05/03/2016 12:40 PM Injection made incrementally with aspirations every 5 mL.  Performed by: Personally  Anesthesiologist: Josephine Igo  Additional Notes: Relevant anatomy ID'd by [place]. Incremental 67ml injection with frequent aspiration. Patient tolerated procedure well.    Procedure Name: Intubation Date/Time: 05/03/2016 1:18 PM Performed by: Lieutenant Diego Pre-anesthesia Checklist: Patient identified, Emergency Drugs available, Suction available and Patient being monitored Patient Re-evaluated:Patient Re-evaluated prior to inductionOxygen Delivery Method: Circle system utilized Preoxygenation: Pre-oxygenation with 100% oxygen Intubation Type: IV induction Ventilation: Mask ventilation without difficulty Laryngoscope Size: Miller and 2 Tube type: Oral Number of attempts: 1 Airway Equipment and Method: Stylet and Oral airway Placement Confirmation: ETT inserted through vocal cords under direct vision,  positive ETCO2 and breath sounds checked- equal and bilateral Secured at: 21 cm Tube secured with: Tape Dental Injury: Teeth and Oropharynx as per pre-operative assessment

## 2016-05-03 NOTE — H&P (Signed)
  The recent History & Physical has been reviewed. I have personally examined the patient today. There is no interval change to the documented History & Physical. The patient would like to proceed with the procedure.  Joni Fears W 05/03/2016,  1:03 PM

## 2016-05-03 NOTE — Progress Notes (Signed)
Assisted Dr. Foster with right, ultrasound guided, interscalene  block. Side rails up, monitors on throughout procedure. See vital signs in flow sheet. Tolerated Procedure well. 

## 2016-05-03 NOTE — Discharge Instructions (Signed)

## 2016-05-03 NOTE — Anesthesia Postprocedure Evaluation (Signed)
Anesthesia Post Note  Patient: Jennifer Coleman  Procedure(s) Performed: Procedure(s) (LRB): RIGHT SHOULDER ARTHROSCOPY WITH MINI-OPEN ROTATOR CUFF REPAIR,DISTAL CLAVICLE RESECTION, SUBACROMIAL DECOMPRESSION. (Right)  Patient location during evaluation: PACU Anesthesia Type: General and Regional Level of consciousness: awake and alert Pain management: pain level controlled Vital Signs Assessment: post-procedure vital signs reviewed and stable Respiratory status: spontaneous breathing, nonlabored ventilation, respiratory function stable and patient connected to nasal cannula oxygen Cardiovascular status: blood pressure returned to baseline and stable Postop Assessment: no signs of nausea or vomiting Anesthetic complications: no    Last Vitals:  Filed Vitals:   05/03/16 1532 05/03/16 1545  BP: 129/63 125/68  Pulse: 83 77  Temp: 36.4 C   Resp: 23 20    Last Pain:  Filed Vitals:   05/03/16 1551  PainSc: Asleep                 Shivam Mestas S

## 2016-05-04 NOTE — Op Note (Signed)
NAMEJOVANI, COLQUHOUN                  ACCOUNT NO.:  [id number]  MEDICAL RECORD NO.:  [id number]  LOCATION:                                 FACILITY:  PHYSICIAN:  Vonna Kotyk. Whitfield, M.D.DATE OF BIRTH:  19-Jun-1951  DATE OF PROCEDURE:  05/03/2016 DATE OF DISCHARGE:                              OPERATIVE REPORT   PREOPERATIVE DIAGNOSES: 1. Rotator cuff tear, right shoulder with impingement. 2. Degenerative joint disease, acromioclavicular joint.  POSTOPERATIVE DIAGNOSES: 1. Rotator cuff tear, right shoulder with impingement. 2. Degenerative joint disease, acromioclavicular joint. 3. Tear of biceps tendon.  PROCEDURE: 1. Diagnostic arthroscopy, right shoulder with debridement of     synovitis. 2. Arthroscopic subacromial decompression. 3. Arthroscopic distal clavicle resection. 4. Mini-open rotator cuff tear repair. 5. Biceps tenodesis.  SURGEON:  Vonna Kotyk. Durward Fortes, MD.  ASSISTANTAaron Edelman D. Petrarca, PA-C, was present throughout the operative procedure to ensure its timely completion.  ANESTHESIA:  General with supplemental interscalene nerve block.  COMPLICATIONS:  None.  HISTORY:  Ms. Head is 65 years old and is several months status post injury to her right shoulder.  She was caring a wheelbarrow when the weight shifted throwing her to the ground.  She had immediate onset of shoulder pain.  She was eventually seen in the office with exam consistent with impingement and possible rotator cuff tear.  She was having difficulty placing her arm overhead.  A subsequent MRI scan demonstrated a complete tear of the supraspinatus tendon with retraction.  She has elected to proceed with arthroscopy and a mini open rotator cuff tear repair.  DESCRIPTION OF PROCEDURE:  Ms. Borelli was met with her husband in the holding area.  I identified the right shoulder as appropriate operative site and marked it accordingly.  Anesthesia performed an interscalene nerve block.  The patient  was then transported to room #6 and placed under general anesthesia without difficulty.  She was then placed in the semi-sitting position with a shoulder frame.  Time-out was called.  Examination of the right shoulder revealed no evidence of adhesive capsulitis or instability.  The shoulder was then prepped with DuraPrep x2 from the base of the neck circumferentially below the elbow.  Sterile draping was performed.  A second time-out was called.  Marking pen was used to outline the [facility] joint, the coracoid, and acromion.  At a point of fingerbreadth posterior and medial to the posterior angle acromion, a small stab wound was made.  The arthroscope easily placed into the shoulder joint.  Diagnostic arthroscopy revealed moderate synovitis diffusely.  There was some chondromalacia of the humeral head representing grade 2 changes and I did not see any appreciable chondromalacia on the glenoid.  There were no loose bodies. Labrum was intact.  There was some very minimal tearing of the subscapularis.  There was an obvious full-thickness tear of the supraspinatus with retraction and multiple areas of tearing of the biceps tendon, some of which were over 50% of the tendon.  I obtained a second portal anteriorly with the rib cannula and release the biceps tendon from its attachment to the superior glenoid.  The arthroscope was then placed in subacromial space posteriorly,  cannula in subacromial space anteriorly, and a third portal established in the lateral subacromial space.  An arthroscopic subacromial decompression was performed.  There was a considerable amount of inflammatory bursal tissue was removed with the ArthroCare wand.  There was anterior overhang of the acromion.  Using a 6 mm burr, an anterior acromioplasty was performed with a nice flat resection.  There was obvious degenerative change at the [facility] joint with a little if any articular cartilage remaining on the distal clavicle  and with inflammatory synovitis.  Distal clavicle resection was performed with the same 6 mm burr, but the bone was quite soft.  The rotator cuff tear was visualized through the bursal surface and it also appeared to involve infraspinatus which was not obviously torn by prior MRI scan.  The instruments were removed.  I re-gloved.  A mini open rotator cuff tear repair was then performed.  About an inch to inch and half incision was made on the anterior aspect of the shoulder via sharp dissection and carried down to subcutaneous tissue. There was abundant amount of adipose tissue that was incised with the Bovie.  Raphae in the deltoid fascia were identified and incised. Subacromial space was entered.  A self-retaining retractor was inserted. The released biceps tendon stump tip was visualized and a biceps tenodesis was performed in the inferior groove using a Biomet 5.5 mm anchor with attached FiberWire.  The biceps tendon was placed under what appeared to be normal tension.  The redundant portion was removed with a 15 blade knife.  The retinaculum was closed.  I then evaluated the rotator cuff tear.  It involved both the infra and supraspinatus with retraction.  By finger manipulation, I was able to release any adhesions such that I could advance both the tendons over the articular surface of the humeral head.  I used 2 further anchors to suture the tendon.  I used 1 Quattro anchor as a second row repair for the infraspinatus and then I used the #2 FiberWire from the supraspinatus as a second row repair through bony trough in the proximal humeral head.  I thought I had a very nice repair.  There was no exposed articular cartilage and had a nice flat resurfaces.  I placed the arm overhead.  There was no obvious tension across the repair and no evidence of impingement.  The wound was then irrigated with saline solution.  The deltoid fascia was closed with running 0 Vicryl, the subcu  with 3-0 Monocryl, skin closed with Steri-Strips over benzoin.  Sterile bulky dressing was applied followed by sling.  PLAN:  Percocet for pain.  Office in the next Wednesday.     Vonna Kotyk. Durward Fortes, M.D.   ______________________________ Vonna Kotyk. Durward Fortes, M.D.    PWW/MEDQ  D:  05/03/2016  T:  05/04/2016  Job:  [id number]

## 2016-05-05 ENCOUNTER — Other Ambulatory Visit: Payer: Self-pay | Admitting: Family Medicine

## 2016-05-08 NOTE — Addendum Note (Signed)
Addendum  created 05/08/16 1126 by Tawni Millers, CRNA   Modules edited: Charges VN

## 2016-05-09 DIAGNOSIS — M19011 Primary osteoarthritis, right shoulder: Secondary | ICD-10-CM | POA: Diagnosis not present

## 2016-05-10 ENCOUNTER — Encounter (HOSPITAL_BASED_OUTPATIENT_CLINIC_OR_DEPARTMENT_OTHER): Payer: Self-pay | Admitting: Orthopaedic Surgery

## 2016-05-14 ENCOUNTER — Ambulatory Visit (HOSPITAL_COMMUNITY): Payer: 59 | Attending: Orthopaedic Surgery | Admitting: Specialist

## 2016-05-14 DIAGNOSIS — M25611 Stiffness of right shoulder, not elsewhere classified: Secondary | ICD-10-CM | POA: Insufficient documentation

## 2016-05-14 DIAGNOSIS — M25621 Stiffness of right elbow, not elsewhere classified: Secondary | ICD-10-CM | POA: Insufficient documentation

## 2016-05-14 DIAGNOSIS — R29898 Other symptoms and signs involving the musculoskeletal system: Secondary | ICD-10-CM | POA: Diagnosis not present

## 2016-05-14 DIAGNOSIS — M25511 Pain in right shoulder: Secondary | ICD-10-CM | POA: Diagnosis not present

## 2016-05-14 NOTE — Therapy (Signed)
[place], [place], [address] Phone: [phone]   Fax:  [phone]  Occupational Therapy Evaluation  Patient Details  Name: Jennifer Coleman MRN: [id number] Date of Birth: 03-Jun-1951 Referring Provider: Dr. Joni Fears  Encounter Date: 05/14/2016      OT End of Session - 05/14/16 2205    Visit Number 1   Number of Visits 24   Date for OT Re-Evaluation 07/13/16   Authorization Type UMR   OT Start Time 1355   OT Stop Time 1430   OT Time Calculation (min) 35 min   Activity Tolerance Patient tolerated treatment well   Behavior During Therapy [facility] for tasks assessed/performed      Past Medical History  Diagnosis Date  . Family history of anesthesia complication 25 yrs ago    Sister allergic to sccinocholine - unable to come off ventilator  . Shingles   . Hypertension   . Depression   . Varicose veins   . PONV (postoperative nausea and vomiting)     Past Surgical History  Procedure Laterality Date  . Cervical disc surgery  1992    Fusion  . Salpingoophorectomy Left 1985  . Knee arthroscopy  09/16/2012    Procedure: ARTHROSCOPY KNEE;  Surgeon: [name], MD;  Location: [place];  Service: Orthopedics;  Laterality: Right;  Right Knee Arthroscopy  . Carpal tunnel release    . Shoulder arthroscopy with open rotator cuff repair and distal clavicle acrominectomy Right 05/03/2016    Procedure: RIGHT SHOULDER ARTHROSCOPY WITH MINI-OPEN ROTATOR CUFF REPAIR,DISTAL CLAVICLE RESECTION, SUBACROMIAL DECOMPRESSION.;  Surgeon: [name], MD;  Location: [place];  Service: Orthopedics;  Laterality: Right;    There were no vitals filed for this visit.      Subjective Assessment - 05/14/16 2148    Subjective  S:  Today I was able to get my own shirt on for the first time.   Pertinent History Mrs. Frentz was moving mulch with a wheelbarrel several monthis ago and felt increased pain in her  right shoulder.  Upon consultation with Dr. Durward Fortes and an MRI a rotator cuff tear was detected.  On 05/03/16 Mrs. Boydstun had a right suacromial decompression, distal clavicle excision, and repair of the supraspinatus, infraspinatus, and subscapularis, as well as a bicep tendoesis.  Dr. Durward Fortes has referred her to occupational therapy for evaluation and treatment following his rotator cuff protocol.   Patient Stated Goals I want to improve my mobility and strength and decrease my pain   Currently in Pain? Yes   Pain Score 7    Pain Location Shoulder   Pain Orientation Right   Pain Descriptors / Indicators Throbbing   Pain Type Acute pain   Pain Onset 1 to 4 weeks ago   Pain Frequency Constant   Aggravating Factors  movement   Pain Relieving Factors rest, medication, ice   Effect of Pain on Daily Activities decreased use of RUE with functional activities           [facility] OT Assessment - 05/14/16 0001    Assessment   Diagnosis S/P Right SAD, DCE, RCR and biceps tenodesis   Referring Provider Dr. Joni Fears   Onset Date 05/03/16   Prior Therapy n/a   Precautions   Precautions Shoulder   Type of Shoulder Precautions Dr. Durward Fortes Protocol:  PROM for 6 weeks ( through 8/17)   Required Braces or Orthoses Sling   Restrictions   Weight  Bearing Restrictions No   Balance Screen   Has the patient fallen in the past 6 months Yes   How many times? 1   Has the patient had a decrease in activity level because of a fear of falling?  No   Is the patient reluctant to leave their home because of a fear of falling?  No   Home  Environment   Family/patient expects to be discharged to: Private residence   Living Arrangements Spouse/significant other   Available Help at Discharge Family   Type of [place]   Prior Function   Level of [place] Full time employment   Tree surgeon at [employer], computer and phone use   Leisure cooking, yard  work, Barrister's clerk, watching grandchidlren play sports and dance   ADL   ADL comments patient is not able to use her right arm with any functional activities, dressing and bathing requires assistance or increased time to complete, unable to drive or work, or complete IADLs   Written Expression   Dominant Hand Right   Vision - History   Baseline Vision Wears glasses all the time   Cognition   Overall Cognitive Status Within Functional Limits for tasks assessed   Observation/Other Assessments   Focus on Therapeutic Outcomes (FOTO)  12/100   Sensation   Light Touch Appears Intact   Coordination   Gross Motor Movements are Fluid and Coordinated Yes   Fine Motor Movements are Fluid and Coordinated Yes   ROM / Strength   AROM / PROM / Strength AROM;PROM;Strength   Palpation   Palpation comment moderate- maximum fascial restrictions in right upper arm, scapular, and shoulder region   AROM   Overall AROM Comments A/ROM not assessed due to recent surgery   AROM Assessment Site Shoulder;Elbow   Right/Left Shoulder Right   Right/Left Elbow Right   PROM   Overall PROM Comments assessed in supine, external and internal rotation with shoulder adducted   PROM Assessment Site Shoulder;Elbow   Right/Left Shoulder Right   Right Shoulder Flexion 63 Degrees   Right Shoulder ABduction 50 Degrees   Right Shoulder Internal Rotation 85 Degrees   Right Shoulder External Rotation 0 Degrees   Right/Left Elbow Right   Right Elbow Flexion 140   Right Elbow Extension 0   Strength   Overall Strength Comments strength not assessed due to recent surgery   Strength Assessment Site Shoulder;Elbow   Right/Left Shoulder Right   Right/Left Elbow Right                  OT Treatments/Exercises (OP) - 05/14/16 0001    Manual Therapy   Manual Therapy Myofascial release   Manual therapy comments manual therapy completed seperately from all other interventions this date   Myofascial Release myofascial  release and manual stretching to right upper arm, scapular, shoulder region and associated areas to decrease pain and restrictions and imrpove pain free mobility in right shoulder.                 OT Education - 05/14/16 2204    Education provided Yes   Education Details table slides, pendulums, elbow - wrist A/ROM   Person(s) Educated Patient   Methods Explanation;Demonstration;Handout   Comprehension Verbalized understanding          OT Short Term Goals - 05/14/16 2212    OT SHORT TERM GOAL #1   Title Patient will be educated on a HEP for right shoulder  ROM and strength.   Time 6   Period Weeks   Status New   OT SHORT TERM GOAL #2   Title Patient will improve right shoulder and elbow P/ROM to [facility] in order to complete B/ADLs with increased independence.   Time 6   Period Weeks   Status New   OT SHORT TERM GOAL #3   Title Patient will improve right shoulder and elbow strength to 3+/5 for increased ability to pick up baskets of laundry.   Time 6   Period Weeks   OT SHORT TERM GOAL #4   Title Patient will decrease right shoulder pain to 4/10 or better in order to complete B/ADLs with greater independence.   Time 6   Period Weeks   Status New   OT SHORT TERM GOAL #5   Title Patient will decrease fascial restrictions to moderate in right shoulder region for greater mobility needed for greater use of right arm as dominant.    Time 6   Period Weeks   Status New           OT Long Term Goals - 05/14/16 2216    OT LONG TERM GOAL #1   Title Patient will return to prior level of independence with all B/IADLs, work, and leisure activities using her right arm as dominant.    Time 12   Period Weeks   Status New   OT LONG TERM GOAL #2   Title Patient will improve right shoulder and elbow A/ROM to WNL for greater ability to use right arm as dominant at work and home.    Time 12   Period Weeks   Status New   OT LONG TERM GOAL #3   Title Patient will improve right  shoulder and elbow strength to 5/5 for greater ability to lift bags of mulch when completing yardwork.     Time 12   Period Weeks   Status New   OT LONG TERM GOAL #4   Title Patient will decrease right shoulder pain to 2/10 or better when completing functional actiivites.    Time 12   Period Weeks   Status New   OT LONG TERM GOAL #5   Title Paitent will decrease right fascial restrictions in right shoulder in order to have greater mobility needed for use of right arm with functional activiites.     Time 12   Period Weeks   Status New               Plan - 05/14/16 2206    Clinical Impression Statement A;  Patient is a 65 year old female with past medical history significant for high blood pressure and past knee surgery.  Patient injured her right shoulder in March 2017 and underwent surgery on 7/6//17 for SAD, DCE, RCR, and biceps tenodesis.  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     Rehab Potential Good   OT Frequency 2x / week   OT Duration 12 weeks   OT Treatment/Interventions Self-care/ADL training;Electrical Stimulation;Moist Heat;Cryotherapy;Ultrasound;Therapeutic exercise;Neuromuscular education;DME and/or AE instruction;Energy conservation;Manual Therapy;Passive range of motion;Scar mobilization;Therapeutic exercises;Therapeutic activities;Patient/family education   Plan P:  Skilled OT intervention to decrease pain and fascial restrictions and improve A/PROM and strength in right arm in order to use right arm as dominant with all functional, B/IADLs, work, leisure activities.  Next visit:  review HEP and  treatment plan, P/ROM   Consulted and Agree with Plan of Care Patient      Patient will benefit from skilled therapeutic intervention in order to improve the following deficits and impairments:  Decreased range of motion, Decreased skin integrity,  Decreased scar mobility, Decreased strength, Increased muscle spasms, Increased fascial restricitons, Impaired UE functional use, Pain  Visit Diagnosis: Pain in right shoulder - Plan: Ot plan of care cert/re-cert  Stiffness of right shoulder, not elsewhere classified - Plan: Ot plan of care cert/re-cert  Stiffness of right elbow, not elsewhere classified - Plan: Ot plan of care cert/re-cert  Other symptoms and signs involving the musculoskeletal system - Plan: Ot plan of care cert/re-cert    Problem List Patient Active Problem List   Diagnosis Date Noted  . Epigastric pain 08/11/2015  . Chest pain, rule out acute myocardial infarction 08/11/2015  . Renal insufficiency 08/11/2015  . Venous stasis 04/13/2015  . Depression 04/13/2015  . Essential hypertension 09/12/2014  . PVCs (premature ventricular contractions) 09/07/2014  . Precordial pain 09/07/2014    Vangie Bicker, [place], OTR/L [phone]  05/14/2016, 10:26 PM  [facility] [address] [place], [place], [address] Phone: [phone]   Fax:  [phone]  Name: Jennifer Coleman MRN: [id number] Date of Birth: 11/20/1950

## 2016-05-14 NOTE — Patient Instructions (Signed)
TOWEL SLIDES COMPLETE FOR 1-3 MINUTES, 3-5 TIMES PER DAY  SHOULDER: Flexion On Table   Place hands on table, elbows straight. Slide arms across table. Hold ___ seconds. ___ reps per set, ___ sets per day, ___ days per week  Abduction (Passive)   With arm out to side, resting on table, lower head toward arm palm down, slide arm across table. Hold ____ seconds. Repeat ____ times. Do ____ sessions per day.  Copyright  VHI. All rights reserved.     Internal Rotation (Assistive)   Seated with elbow bent at right angle and held against side, slide arm on table surface in an inward arc. Repeat ____ times. Do ____ sessions per day. Activity: Use this motion to brush crumbs off the table.  Copyright  VHI. All rights reserved.    COMPLETE PENDULUM EXERCISES FOR 30 SECONDS TO A MINUTE EACH, 3-5 TIMES PER DAY. ROM: Pendulum (Side-to-Side)   D http://orth.exer.us/792   Copyright  VHI. All rights reserved.  Pendulum Forward/Back   Bend forward 90 at waist, using table for support. Rock body forward and back to swing arm. Repeat ____ times. Do ____ sessions per day.  Copyright  VHI. All rights reserved.  Pendulum Circular   Bend forward 90 at waist, leaning on table for support. Rock body in a circular pattern to move arm clockwise ____ times then counterclockwise ____ times. Do ____ sessions per day.  Copyright  VHI. All rights reserved.  AROM: Wrist Extension   With right palm down, bend wrist up. Repeat 10____ times per set. Do ____ sets per session. Do __3__ sessions per day.  Copyright  VHI. All rights reserved.   AROM: Wrist Flexion   With right palm up, bend wrist up. Repeat ___10_ times per set. Do ____ sets per session. Do __3__ sessions per day.  Copyright  VHI. All rights reserved.   AROM: Forearm Pronation / Supination   With right arm in handshake position, slowly rotate palm down until stretch is felt. Relax. Then rotate palm up until stretch  is felt. Repeat __10__ times per set. Do ____ sets per session. Do __3__ sessions per day.  Copyright  VHI. All rights reserved.   AFlexion (Passive)   Use other hand to bend elbow, with thumb toward same shoulder. Do NOT force this motion. Hold ____ seconds. Repeat ____ times. Do ____ sessions per day.  Copyright  VHI. All rights reserved.    With left hand palm up, gently bend elbow as far as possible. Then straighten arm as far as possible. Repeat ____ times per set. Do ____ sets per session. Do ____ sessions per day.  Copyright  VHI. All rights reserved.

## 2016-05-18 ENCOUNTER — Encounter (HOSPITAL_COMMUNITY): Payer: Self-pay

## 2016-05-18 ENCOUNTER — Ambulatory Visit (HOSPITAL_COMMUNITY): Payer: 59

## 2016-05-18 DIAGNOSIS — M25621 Stiffness of right elbow, not elsewhere classified: Secondary | ICD-10-CM

## 2016-05-18 DIAGNOSIS — M25611 Stiffness of right shoulder, not elsewhere classified: Secondary | ICD-10-CM | POA: Diagnosis not present

## 2016-05-18 DIAGNOSIS — M25511 Pain in right shoulder: Secondary | ICD-10-CM

## 2016-05-18 DIAGNOSIS — R29898 Other symptoms and signs involving the musculoskeletal system: Secondary | ICD-10-CM

## 2016-05-18 NOTE — Patient Instructions (Signed)
1) Seated Row   Sit up straight with elbows by your sides. Pull back with shoulders/elbows, keeping forearms straight, as if pulling back on the reins of a horse. Squeeze shoulder blades together. Repeat _10-12__times, ____sets/day    2) Shoulder Elevation    Sit up straight with arms by your sides. Slowly bring your shoulders up towards your ears. Repeat_10-12__times, ____ sets/day    3) Shoulder Extension    Sit up straight with both arms by your side, draw your arms back behind your waist. Keep your elbows straight. Repeat __10-12__times, ____sets/day.        

## 2016-05-18 NOTE — Therapy (Signed)
[place]enn[place], [place], [address] Phone: [phone]   Fax:  [phone]  Occupational Therapy Treatment  Patient Details  Name: Jennifer Coleman MRN: [id number] Date of Birth: 02-24-1951 Referring Provider: Dr. Joni Fears  Encounter Date: 05/18/2016      OT End of Session - 05/18/16 1412    Visit Number 2   Number of Visits 24   Date for OT Re-Evaluation 07/13/16   Authorization Type UMR   OT Start Time 1300   OT Stop Time 1345   OT Time Calculation (min) 45 min   Activity Tolerance Patient tolerated treatment well   Behavior During Therapy [facility] for tasks assessed/performed      Past Medical History  Diagnosis Date  . Family history of anesthesia complication 25 yrs ago    Sister allergic to sccinocholine - unable to come off ventilator  . Shingles   . Hypertension   . Depression   . Varicose veins   . PONV (postoperative nausea and vomiting)     Past Surgical History  Procedure Laterality Date  . Cervical disc surgery  1992    Fusion  . Salpingoophorectomy Left 1985  . Knee arthroscopy  09/16/2012    Procedure: ARTHROSCOPY KNEE;  Surgeon: [name], MD;  Location: [place];  Service: Orthopedics;  Laterality: Right;  Right Knee Arthroscopy  . Carpal tunnel release    . Shoulder arthroscopy with open rotator cuff repair and distal clavicle acrominectomy Right 05/03/2016    Procedure: RIGHT SHOULDER ARTHROSCOPY WITH MINI-OPEN ROTATOR CUFF REPAIR,DISTAL CLAVICLE RESECTION, SUBACROMIAL DECOMPRESSION.;  Surgeon: [name], MD;  Location: [facility];  Service: Orthopedics;  Laterality: Right;    There were no vitals filed for this visit.      Subjective Assessment - 05/18/16 1406    Subjective  S: I'm been doing my exercises at home.    Currently in Pain? Yes   Pain Score 5    Pain Location Shoulder   Pain Orientation Right   Pain Descriptors / Indicators Sharp;Throbbing    Pain Type Acute pain            [facility] OT Assessment - 05/18/16 1407    Assessment   Diagnosis S/P Right SAD, DCE, RCR and biceps tenodesis   Precautions   Precautions Shoulder   Type of Shoulder Precautions Dr. Durward Fortes Protocol:  PROM for 6 weeks ( through 8/17)                  OT Treatments/Exercises (OP) - 05/18/16 1407    Exercises   Exercises Shoulder   Shoulder Exercises: Supine   Protraction PROM;10 reps   Horizontal ABduction PROM;10 reps   External Rotation PROM;10 reps   Internal Rotation PROM;10 reps   Flexion PROM;10 reps   ABduction PROM;10 reps   Shoulder Exercises: Seated   Elevation AROM;10 reps   Extension AROM;10 reps   Row AROM;10 reps   Shoulder Exercises: Therapy Ball   Flexion 10 reps   ABduction 10 reps   Shoulder Exercises: Isometric Strengthening   Flexion Supine;3X3"   Extension Supine;3X3"   External Rotation Supine;3X3"   Internal Rotation Supine;3X3"   ABduction Supine;3X3"   ADduction Supine;3X3"   Manual Therapy   Manual Therapy Myofascial release   Manual therapy comments manual therapy completed seperately from all other interventions this date   Myofascial Release myofascial release and manual stretching to right upper arm, scapular, shoulder region and  associated areas to decrease pain and restrictions and imrpove pain free mobility in right shoulder.                  OT Education - 05/18/16 1412    Education provided Yes   Education Details Pt was given print out of OT evaluation and goals and POC were reviewed. pt was also given seated A/ROM scapular exercises (row, extension, elevation)   Person(s) Educated Patient   Methods Explanation;Demonstration;Handout;Verbal cues   Comprehension Returned demonstration;Verbalized understanding          OT Short Term Goals - 05/18/16 1415    OT SHORT TERM GOAL #1   Title Patient will be educated on a HEP for right shoulder ROM and strength.   Time 6   Period  Weeks   Status On-going   OT SHORT TERM GOAL #2   Title Patient will improve right shoulder and elbow P/ROM to [facility] in order to complete B/ADLs with increased independence.   Time 6   Period Weeks   Status On-going   OT SHORT TERM GOAL #3   Title Patient will improve right shoulder and elbow strength to 3+/5 for increased ability to pick up baskets of laundry.   Time 6   Period Weeks   Status On-going   OT SHORT TERM GOAL #4   Title Patient will decrease right shoulder pain to 4/10 or better in order to complete B/ADLs with greater independence.   Time 6   Period Weeks   Status On-going   OT SHORT TERM GOAL #5   Title Patient will decrease fascial restrictions to moderate in right shoulder region for greater mobility needed for greater use of right arm as dominant.    Time 6   Period Weeks   Status On-going           OT Long Term Goals - 05/18/16 1415    OT LONG TERM GOAL #1   Title Patient will return to prior level of independence with all B/IADLs, work, and leisure activities using her right arm as dominant.    Time 12   Period Weeks   Status On-going   OT LONG TERM GOAL #2   Title Patient will improve right shoulder and elbow A/ROM to WNL for greater ability to use right arm as dominant at work and home.    Time 12   Period Weeks   Status On-going   OT LONG TERM GOAL #3   Title Patient will improve right shoulder and elbow strength to 5/5 for greater ability to lift bags of mulch when completing yardwork.     Time 12   Period Weeks   Status On-going   OT LONG TERM GOAL #4   Title Patient will decrease right shoulder pain to 2/10 or better when completing functional actiivites.    Time 12   Period Weeks   Status On-going   OT LONG TERM GOAL #5   Title Paitent will decrease right fascial restrictions in right shoulder in order to have greater mobility needed for use of right arm with functional activiites.     Time 12   Period Weeks   Status On-going                Plan - 05/18/16 1413    Clinical Impression Statement A: Initiated myofascial release, manual stretching, isometric exercises, therapy ball exercises, and seated scapular A/ROM. Pt was able to tolerate limited P/ROM during manual stretching due to pain. VC  for form and technique during exercises.    Plan P: Continue to follow protocol and work on increasing passive ROM during manual stretching. Add anterior and caudel glide exercises.       Patient will benefit from skilled therapeutic intervention in order to improve the following deficits and impairments:  Decreased range of motion, Decreased skin integrity, Decreased scar mobility, Decreased strength, Increased muscle spasms, Increased fascial restricitons, Impaired UE functional use, Pain  Visit Diagnosis: Stiffness of right shoulder, not elsewhere classified  Pain in right shoulder  Stiffness of right elbow, not elsewhere classified  Other symptoms and signs involving the musculoskeletal system    Problem List Patient Active Problem List   Diagnosis Date Noted  . Epigastric pain 08/11/2015  . Chest pain, rule out acute myocardial infarction 08/11/2015  . Renal insufficiency 08/11/2015  . Venous stasis 04/13/2015  . Depression 04/13/2015  . Essential hypertension 09/12/2014  . PVCs (premature ventricular contractions) 09/07/2014  . Precordial pain 09/07/2014    Ailene Ravel, OTR/L,CBIS  [phone]  05/18/2016, 2:16 PM  [facility] [address] [place], [place], [address] Phone: [phone]   Fax:  [phone]  Name: ANAELI KEMPEN MRN: [id number] Date of Birth: 1951-08-26

## 2016-05-23 ENCOUNTER — Ambulatory Visit (HOSPITAL_COMMUNITY): Payer: 59

## 2016-05-23 DIAGNOSIS — M25621 Stiffness of right elbow, not elsewhere classified: Secondary | ICD-10-CM | POA: Diagnosis not present

## 2016-05-23 DIAGNOSIS — M25611 Stiffness of right shoulder, not elsewhere classified: Secondary | ICD-10-CM

## 2016-05-23 DIAGNOSIS — R29898 Other symptoms and signs involving the musculoskeletal system: Secondary | ICD-10-CM | POA: Diagnosis not present

## 2016-05-23 DIAGNOSIS — M25511 Pain in right shoulder: Secondary | ICD-10-CM | POA: Diagnosis not present

## 2016-05-23 NOTE — Therapy (Addendum)
[place], [place], [address] Phone: [phone]   Fax:  [phone]  Occupational Therapy Treatment  Patient Details  Name: Jennifer Coleman MRN: [id number] Date of Birth: [birth year] Referring Provider: Dr. Joni Fears  Encounter Date: 05/23/2016      OT End of Session - 05/23/16 0857    Visit Number 3   Number of Visits 24   Date for OT Re-Evaluation 07/13/16   Authorization Type UMR   OT Start Time 0827  patient arrived late   OT Stop Time 0900   OT Time Calculation (min) 33 min   Activity Tolerance Patient tolerated treatment well   Behavior During Therapy [facility] for tasks assessed/performed      Past Medical History:  Diagnosis Date  . Depression   . Family history of anesthesia complication 25 yrs ago   Sister allergic to sccinocholine - unable to come off ventilator  . Hypertension   . PONV (postoperative nausea and vomiting)   . Shingles   . Varicose veins     Past Surgical History:  Procedure Laterality Date  . CARPAL TUNNEL RELEASE    . CERVICAL DISC SURGERY  1992   Fusion  . KNEE ARTHROSCOPY  09/16/2012   Procedure: ARTHROSCOPY KNEE;  Surgeon: [name], MD;  Location: [place];  Service: Orthopedics;  Laterality: Right;  Right Knee Arthroscopy  . SALPINGOOPHORECTOMY Left 1985  . SHOULDER ARTHROSCOPY WITH OPEN ROTATOR CUFF REPAIR AND DISTAL CLAVICLE ACROMINECTOMY Right 05/03/2016   Procedure: RIGHT SHOULDER ARTHROSCOPY WITH MINI-OPEN ROTATOR CUFF REPAIR,DISTAL CLAVICLE RESECTION, SUBACROMIAL DECOMPRESSION.;  Surgeon: [name], MD;  Location: [place];  Service: Orthopedics;  Laterality: Right;    There were no vitals filed for this visit.      Subjective Assessment - 05/23/16 0856    Subjective  S; I've been doing good with my exercises.    Currently in Pain? Yes   Pain Score 4    Pain Location Shoulder   Pain Orientation Right   Pain Descriptors /  Indicators Sharp;Throbbing   Pain Type Acute pain            [facility] OT Assessment - 05/23/16 0844      Assessment   Diagnosis S/P Right SAD, DCE, RCR and biceps tenodesis     Precautions   Precautions Shoulder   Type of Shoulder Precautions Dr. Durward Fortes Protocol:  PROM for 6 weeks ( through 8/17)                  OT Treatments/Exercises (OP) - 05/23/16 0845      Exercises   Exercises Shoulder;Elbow     Shoulder Exercises: Supine   Protraction PROM;10 reps   Horizontal ABduction PROM;10 reps   External Rotation PROM;10 reps   Internal Rotation PROM;10 reps   Flexion PROM;10 reps   ABduction PROM;10 reps     Shoulder Exercises: Seated   Elevation AROM;10 reps   Extension AROM;10 reps   Row AROM;10 reps     Shoulder Exercises: Therapy Ball   Flexion 10 reps   ABduction 10 reps     Shoulder Exercises: ROM/Strengthening   Anterior Glide 3x10"   Caudal Glide 3x10"     Shoulder Exercises: Isometric Strengthening   Flexion Supine;3X3"   Extension Supine;3X3"   External Rotation Supine;3X3"   Internal Rotation Supine;3X3"   ABduction Supine;3X3"   ADduction Supine;3X3"     Elbow Exercises   Other elbow exercises  P/ROM elbow flexion and extension 10X     Manual Therapy   Manual Therapy Muscle Energy Technique   Manual therapy comments manual therapy completed seperately from all other interventions this date       Muscle Energy Technique Muscle energy  technique completed to right anterior deltoid to relax tone and muscle spasm and improve range of motion.                    OT Short Term Goals - 05/18/16 1415      OT SHORT TERM GOAL #1   Title Patient will be educated on a HEP for right shoulder ROM and strength.   Time 6   Period Weeks   Status On-going     OT SHORT TERM GOAL #2   Title Patient will improve right shoulder and elbow P/ROM to [facility] in order to complete B/ADLs with increased independence.   Time 6   Period Weeks    Status On-going     OT SHORT TERM GOAL #3   Title Patient will improve right shoulder and elbow strength to 3+/5 for increased ability to pick up baskets of laundry.   Time 6   Period Weeks   Status On-going     OT SHORT TERM GOAL #4   Title Patient will decrease right shoulder pain to 4/10 or better in order to complete B/ADLs with greater independence.   Time 6   Period Weeks   Status On-going     OT SHORT TERM GOAL #5   Title Patient will decrease fascial restrictions to moderate in right shoulder region for greater mobility needed for greater use of right arm as dominant.    Time 6   Period Weeks   Status On-going           OT Long Term Goals - 05/18/16 1415      OT LONG TERM GOAL #1   Title Patient will return to prior level of independence with all B/IADLs, work, and leisure activities using her right arm as dominant.    Time 12   Period Weeks   Status On-going     OT LONG TERM GOAL #2   Title Patient will improve right shoulder and elbow A/ROM to WNL for greater ability to use right arm as dominant at work and home.    Time 12   Period Weeks   Status On-going     OT LONG TERM GOAL #3   Title Patient will improve right shoulder and elbow strength to 5/5 for greater ability to lift bags of mulch when completing yardwork.     Time 12   Period Weeks   Status On-going     OT LONG TERM GOAL #4   Title Patient will decrease right shoulder pain to 2/10 or better when completing functional actiivites.    Time 12   Period Weeks   Status On-going     OT LONG TERM GOAL #5   Title Paitent will decrease right fascial restrictions in right shoulder in order to have greater mobility needed for use of right arm with functional activiites.     Time 12   Period Weeks   Status On-going               Plan - 05/23/16 1014    Clinical Impression Statement A: Pt arrived late. Myofascial release was not completed this date. Added anterior and caudel glides this  session. patient contines to have increased pain  during passive stretching although with muscle energy technique was able to achieve an increase in P/ROM. VC for form and technique during exercises.    Plan P: Continue with protocol and work on increasing passive ROM during manual stretching.       Patient will benefit from skilled therapeutic intervention in order to improve the following deficits and impairments:  Decreased range of motion, Decreased skin integrity, Decreased scar mobility, Decreased strength, Increased muscle spasms, Increased fascial restricitons, Impaired UE functional use, Pain  Visit Diagnosis: Stiffness of right shoulder, not elsewhere classified  Pain in right shoulder  Stiffness of right elbow, not elsewhere classified  Other symptoms and signs involving the musculoskeletal system    Problem List Patient Active Problem List   Diagnosis Date Noted  . Epigastric pain 08/11/2015  . Chest pain, rule out acute myocardial infarction 08/11/2015  . Renal insufficiency 08/11/2015  . Venous stasis 04/13/2015  . Depression 04/13/2015  . Essential hypertension 09/12/2014  . PVCs (premature ventricular contractions) 09/07/2014  . Precordial pain 09/07/2014   Ailene Ravel, OTR/L,CBIS  [phone]  05/23/2016, 10:17 AM  [place] [address] [place], [place], [address] Phone: [phone]   Fax:  [phone]  Name: Jennifer Coleman MRN: [id number] Date of Birth: 01-06-1951

## 2016-05-25 ENCOUNTER — Ambulatory Visit (HOSPITAL_COMMUNITY): Payer: 59

## 2016-05-25 ENCOUNTER — Encounter (HOSPITAL_COMMUNITY): Payer: Self-pay

## 2016-05-25 DIAGNOSIS — R29898 Other symptoms and signs involving the musculoskeletal system: Secondary | ICD-10-CM | POA: Diagnosis not present

## 2016-05-25 DIAGNOSIS — M25621 Stiffness of right elbow, not elsewhere classified: Secondary | ICD-10-CM | POA: Diagnosis not present

## 2016-05-25 DIAGNOSIS — M25511 Pain in right shoulder: Secondary | ICD-10-CM

## 2016-05-25 DIAGNOSIS — M25611 Stiffness of right shoulder, not elsewhere classified: Secondary | ICD-10-CM

## 2016-05-25 NOTE — Therapy (Signed)
[place], [place], [address] Phone: [phone]   Fax:  [phone]  Occupational Therapy Treatment  Patient Details  Name: Jennifer Coleman MRN: [id number] Date of Birth: 07-28-1951 Referring Provider: Dr. Joni Fears  Encounter Date: 05/25/2016      OT End of Session - 05/25/16 0901    Visit Number 4   Number of Visits 24   Date for OT Re-Evaluation 07/13/16   Authorization Type UMR   OT Start Time 0827  patient arrived late   OT Stop Time 0901   OT Time Calculation (min) 34 min   Activity Tolerance Patient tolerated treatment well   Behavior During Therapy [facility] for tasks assessed/performed      Past Medical History:  Diagnosis Date  . Depression   . Family history of anesthesia complication 25 yrs ago   Sister allergic to sccinocholine - unable to come off ventilator  . Hypertension   . PONV (postoperative nausea and vomiting)   . Shingles   . Varicose veins     Past Surgical History:  Procedure Laterality Date  . CARPAL TUNNEL RELEASE    . CERVICAL DISC SURGERY  1992   Fusion  . KNEE ARTHROSCOPY  09/16/2012   Procedure: ARTHROSCOPY KNEE;  Surgeon: [name], MD;  Location: [place];  Service: Orthopedics;  Laterality: Right;  Right Knee Arthroscopy  . SALPINGOOPHORECTOMY Left 1985  . SHOULDER ARTHROSCOPY WITH OPEN ROTATOR CUFF REPAIR AND DISTAL CLAVICLE ACROMINECTOMY Right 05/03/2016   Procedure: RIGHT SHOULDER ARTHROSCOPY WITH MINI-OPEN ROTATOR CUFF REPAIR,DISTAL CLAVICLE RESECTION, SUBACROMIAL DECOMPRESSION.;  Surgeon: [name], MD;  Location: [place];  Service: Orthopedics;  Laterality: Right;    There were no vitals filed for this visit.      Subjective Assessment - 05/25/16 0855    Subjective  S: The one exercise that I do in the chair with my elbows bent is so hard. It hurts.    Currently in Pain? Yes   Pain Score 4    Pain Location Shoulder   Pain  Orientation Right   Pain Descriptors / Indicators Sharp;Throbbing   Pain Type Acute pain            [facility] OT Assessment - 05/25/16 0850      Assessment   Diagnosis S/P Right SAD, DCE, RCR and biceps tenodesis     Precautions   Precautions Shoulder   Type of Shoulder Precautions Dr. Durward Fortes Protocol:  PROM for 6 weeks ( through 8/17)                  OT Treatments/Exercises (OP) - 05/25/16 0850      Exercises   Exercises Shoulder;Elbow     Shoulder Exercises: Supine   Protraction PROM;10 reps   Horizontal ABduction PROM;10 reps   External Rotation PROM;10 reps   Internal Rotation PROM;10 reps   Flexion PROM;10 reps   ABduction PROM;10 reps     Shoulder Exercises: Seated   Elevation AROM;10 reps   Extension AROM;10 reps   Row AROM;10 reps     Shoulder Exercises: Therapy Ball   Flexion --  12X   ABduction --     Shoulder Exercises: Isometric Strengthening   Flexion Supine  3x10   Extension Supine  3x10   External Rotation Supine  3x10   Internal Rotation Supine  3x10   ABduction Supine  3x10   ADduction Supine  3x10     Manual  Therapy   Manual Therapy Muscle Energy Technique   Manual therapy comments manual therapy completed seperately from all other interventions this date   Muscle Energy Technique Muscle energy  technique completed to right anterior deltoid to relax tone and muscle spasm and improve range of motion.                  OT Education - 05/25/16 0859    Education provided Yes   Education Details Educated patient on how to perform isometric exercises (shoulder flexion, extension, abduction) using a pillow and a wall.    Person(s) Educated Patient   Methods Explanation;Demonstration   Comprehension Verbalized understanding;Returned demonstration          OT Short Term Goals - 05/18/16 1415      OT SHORT TERM GOAL #1   Title Patient will be educated on a HEP for right shoulder ROM and strength.   Time 6    Period Weeks   Status On-going     OT SHORT TERM GOAL #2   Title Patient will improve right shoulder and elbow P/ROM to [facility] in order to complete B/ADLs with increased independence.   Time 6   Period Weeks   Status On-going     OT SHORT TERM GOAL #3   Title Patient will improve right shoulder and elbow strength to 3+/5 for increased ability to pick up baskets of laundry.   Time 6   Period Weeks   Status On-going     OT SHORT TERM GOAL #4   Title Patient will decrease right shoulder pain to 4/10 or better in order to complete B/ADLs with greater independence.   Time 6   Period Weeks   Status On-going     OT SHORT TERM GOAL #5   Title Patient will decrease fascial restrictions to moderate in right shoulder region for greater mobility needed for greater use of right arm as dominant.    Time 6   Period Weeks   Status On-going           OT Long Term Goals - 05/18/16 1415      OT LONG TERM GOAL #1   Title Patient will return to prior level of independence with all B/IADLs, work, and leisure activities using her right arm as dominant.    Time 12   Period Weeks   Status On-going     OT LONG TERM GOAL #2   Title Patient will improve right shoulder and elbow A/ROM to WNL for greater ability to use right arm as dominant at work and home.    Time 12   Period Weeks   Status On-going     OT LONG TERM GOAL #3   Title Patient will improve right shoulder and elbow strength to 5/5 for greater ability to lift bags of mulch when completing yardwork.     Time 12   Period Weeks   Status On-going     OT LONG TERM GOAL #4   Title Patient will decrease right shoulder pain to 2/10 or better when completing functional actiivites.    Time 12   Period Weeks   Status On-going     OT LONG TERM GOAL #5   Title Paitent will decrease right fascial restrictions in right shoulder in order to have greater mobility needed for use of right arm with functional activiites.     Time 12   Period  Weeks   Status On-going  Plan - 05/25/16 0927    Clinical Impression Statement A: Pt arrived late and myofascial release was not completed this date. Pt was interested in completing some isometric exercises at home. Therapist provided visual demonstration. Pt required some cueing on shoulder row   Plan P: Continue to follow protocol and work on increasing passive ROM during manual stretching. Resume missed exercises.       Patient will benefit from skilled therapeutic intervention in order to improve the following deficits and impairments:  Decreased range of motion, Decreased skin integrity, Decreased scar mobility, Decreased strength, Increased muscle spasms, Increased fascial restricitons, Impaired UE functional use, Pain  Visit Diagnosis: Stiffness of right shoulder, not elsewhere classified  Pain in right shoulder  Stiffness of right elbow, not elsewhere classified    Problem List Patient Active Problem List   Diagnosis Date Noted  . Epigastric pain 08/11/2015  . Chest pain, rule out acute myocardial infarction 08/11/2015  . Renal insufficiency 08/11/2015  . Venous stasis 04/13/2015  . Depression 04/13/2015  . Essential hypertension 09/12/2014  . PVCs (premature ventricular contractions) 09/07/2014  . Precordial pain 09/07/2014   Ailene Ravel, OTR/L,CBIS  [phone]  05/25/2016, 9:33 AM  [facility] [address] [place], [place], [address] Phone: [phone]   Fax:  [phone]  Name: Jennifer Coleman MRN: [id number] Date of Birth: [birth year]

## 2016-05-29 ENCOUNTER — Encounter (HOSPITAL_COMMUNITY): Payer: Self-pay | Admitting: Occupational Therapy

## 2016-05-29 ENCOUNTER — Ambulatory Visit (HOSPITAL_COMMUNITY): Payer: 59 | Attending: Orthopaedic Surgery | Admitting: Occupational Therapy

## 2016-05-29 DIAGNOSIS — M25621 Stiffness of right elbow, not elsewhere classified: Secondary | ICD-10-CM | POA: Diagnosis not present

## 2016-05-29 DIAGNOSIS — M25611 Stiffness of right shoulder, not elsewhere classified: Secondary | ICD-10-CM | POA: Diagnosis not present

## 2016-05-29 DIAGNOSIS — M25511 Pain in right shoulder: Secondary | ICD-10-CM | POA: Insufficient documentation

## 2016-05-29 DIAGNOSIS — R29898 Other symptoms and signs involving the musculoskeletal system: Secondary | ICD-10-CM | POA: Insufficient documentation

## 2016-05-29 NOTE — Therapy (Signed)
[place], [place], [address] Phone: [phone]   Fax:  [phone]  Occupational Therapy Treatment  Patient Details  Name: Jennifer Coleman MRN: [id number] Date of Birth: 1951-09-21 Referring Provider: Dr. Joni Fears  Encounter Date: 05/29/2016      OT End of Session - 05/29/16 1211    Visit Number 5   Number of Visits 24   Date for OT Re-Evaluation 07/13/16   Authorization Type UMR   OT Start Time 1120   OT Stop Time 1205   OT Time Calculation (min) 45 min   Activity Tolerance Patient tolerated treatment well   Behavior During Therapy [facility] for tasks assessed/performed      Past Medical History:  Diagnosis Date  . Depression   . Family history of anesthesia complication 25 yrs ago   Sister allergic to sccinocholine - unable to come off ventilator  . Hypertension   . PONV (postoperative nausea and vomiting)   . Shingles   . Varicose veins     Past Surgical History:  Procedure Laterality Date  . CARPAL TUNNEL RELEASE    . CERVICAL DISC SURGERY  1992   Fusion  . KNEE ARTHROSCOPY  09/16/2012   Procedure: ARTHROSCOPY KNEE;  Surgeon: [name], MD;  Location: [place];  Service: Orthopedics;  Laterality: Right;  Right Knee Arthroscopy  . SALPINGOOPHORECTOMY Left 1985  . SHOULDER ARTHROSCOPY WITH OPEN ROTATOR CUFF REPAIR AND DISTAL CLAVICLE ACROMINECTOMY Right 05/03/2016   Procedure: RIGHT SHOULDER ARTHROSCOPY WITH MINI-OPEN ROTATOR CUFF REPAIR,DISTAL CLAVICLE RESECTION, SUBACROMIAL DECOMPRESSION.;  Surgeon: [name], MD;  Location: [place];  Service: Orthopedics;  Laterality: Right;    There were no vitals filed for this visit.      Subjective Assessment - 05/29/16 1122    Subjective  S: I've been doing my exercises three times a day.    Currently in Pain? Yes   Pain Score 4    Pain Location Shoulder   Pain Orientation Right   Pain Descriptors / Indicators Aching   Pain Type Acute pain   Pain Radiating Towards n/a   Pain Onset 1 to 4 weeks ago   Pain Frequency Constant   Aggravating Factors  movement   Pain Relieving Factors rest, medication, ice   Effect of Pain on Daily Activities limited use of RUE with ADL tasks            [facility] OT Assessment - 05/29/16 1121      Assessment   Diagnosis S/P Right SAD, DCE, RCR and biceps tenodesis     Precautions   Precautions Shoulder   Type of Shoulder Precautions Dr. Durward Fortes Protocol:  PROM for 6 weeks ( through 8/17)                  OT Treatments/Exercises (OP) - 05/29/16 1123      Exercises   Exercises Shoulder;Elbow     Shoulder Exercises: Supine   Protraction PROM;10 reps   Horizontal ABduction PROM;10 reps   External Rotation PROM;10 reps   Internal Rotation PROM;10 reps   Flexion PROM;10 reps   ABduction PROM;10 reps     Shoulder Exercises: Seated   Elevation AROM;10 reps   Extension AROM;10 reps   Row AROM;10 reps     Shoulder Exercises: Therapy Ball   Flexion 10 reps   ABduction 10 reps     Shoulder Exercises: Isometric Strengthening   Flexion Supine  3X10  Extension Supine  3X10   External Rotation Supine  3X10   Internal Rotation Supine  3X10   ABduction Supine  3X10   ADduction Supine  3X10     Manual Therapy   Manual Therapy Myofascial release   Manual therapy comments manual therapy completed seperately from all other interventions this date   Myofascial Release myofascial release and manual stretching to right upper arm, scapular, shoulder region and associated areas to decrease pain and restrictions and imrpove pain free mobility in right shoulder.     Muscle Energy Technique Muscle energy  technique completed to right anterior deltoid to relax tone and muscle spasm and improve range of motion.                    OT Short Term Goals - 05/18/16 1415      OT SHORT TERM GOAL #1   Title Patient will be educated on a HEP for right  shoulder ROM and strength.   Time 6   Period Weeks   Status On-going     OT SHORT TERM GOAL #2   Title Patient will improve right shoulder and elbow P/ROM to [facility] in order to complete B/ADLs with increased independence.   Time 6   Period Weeks   Status On-going     OT SHORT TERM GOAL #3   Title Patient will improve right shoulder and elbow strength to 3+/5 for increased ability to pick up baskets of laundry.   Time 6   Period Weeks   Status On-going     OT SHORT TERM GOAL #4   Title Patient will decrease right shoulder pain to 4/10 or better in order to complete B/ADLs with greater independence.   Time 6   Period Weeks   Status On-going     OT SHORT TERM GOAL #5   Title Patient will decrease fascial restrictions to moderate in right shoulder region for greater mobility needed for greater use of right arm as dominant.    Time 6   Period Weeks   Status On-going           OT Long Term Goals - 05/18/16 1415      OT LONG TERM GOAL #1   Title Patient will return to prior level of independence with all B/IADLs, work, and leisure activities using her right arm as dominant.    Time 12   Period Weeks   Status On-going     OT LONG TERM GOAL #2   Title Patient will improve right shoulder and elbow A/ROM to WNL for greater ability to use right arm as dominant at work and home.    Time 12   Period Weeks   Status On-going     OT LONG TERM GOAL #3   Title Patient will improve right shoulder and elbow strength to 5/5 for greater ability to lift bags of mulch when completing yardwork.     Time 12   Period Weeks   Status On-going     OT LONG TERM GOAL #4   Title Patient will decrease right shoulder pain to 2/10 or better when completing functional actiivites.    Time 12   Period Weeks   Status On-going     OT LONG TERM GOAL #5   Title Paitent will decrease right fascial restrictions in right shoulder in order to have greater mobility needed for use of right arm with  functional activiites.     Time 12   Period  Weeks   Status On-going               Plan - 05/29/16 1211    Clinical Impression Statement A: Continued isometrics, P/ROM, scapular A/ROM, therapy ball exercises. Pt with good form, occasional verbal cuing. Reviewed home isometrics with pt, questions answered with pt verbalizing understanding.    Rehab Potential Good   OT Frequency 2x / week   OT Duration 12 weeks   OT Treatment/Interventions Self-care/ADL training;Electrical Stimulation;Moist Heat;Cryotherapy;Ultrasound;Therapeutic exercise;Neuromuscular education;DME and/or AE instruction;Energy conservation;Manual Therapy;Passive range of motion;Scar mobilization;Therapeutic exercises;Therapeutic activities;Patient/family education   Plan P: Continue working ton increase P/ROM to [facility], resume missed shoulder mobilizations       Patient will benefit from skilled therapeutic intervention in order to improve the following deficits and impairments:  Decreased range of motion, Decreased skin integrity, Decreased scar mobility, Decreased strength, Increased muscle spasms, Increased fascial restricitons, Impaired UE functional use, Pain  Visit Diagnosis: Stiffness of right shoulder, not elsewhere classified  Pain in right shoulder  Stiffness of right elbow, not elsewhere classified  Other symptoms and signs involving the musculoskeletal system    Problem List Patient Active Problem List   Diagnosis Date Noted  . Epigastric pain 08/11/2015  . Chest pain, rule out acute myocardial infarction 08/11/2015  . Renal insufficiency 08/11/2015  . Venous stasis 04/13/2015  . Depression 04/13/2015  . Essential hypertension 09/12/2014  . PVCs (premature ventricular contractions) 09/07/2014  . Precordial pain 09/07/2014    Guadelupe Sabin, OTR/L  [phone] 05/29/2016, 12:14 PM  [place] [address] [place], [place], [address] Phone:  [phone]   Fax:  [phone]  Name: Jennifer Coleman MRN: [id number] Date of Birth: [birth year]

## 2016-06-01 ENCOUNTER — Ambulatory Visit (HOSPITAL_COMMUNITY): Payer: 59

## 2016-06-01 ENCOUNTER — Encounter (HOSPITAL_COMMUNITY): Payer: Self-pay

## 2016-06-01 DIAGNOSIS — R29898 Other symptoms and signs involving the musculoskeletal system: Secondary | ICD-10-CM | POA: Diagnosis not present

## 2016-06-01 DIAGNOSIS — M25621 Stiffness of right elbow, not elsewhere classified: Secondary | ICD-10-CM | POA: Diagnosis not present

## 2016-06-01 DIAGNOSIS — M25511 Pain in right shoulder: Secondary | ICD-10-CM

## 2016-06-01 DIAGNOSIS — M25611 Stiffness of right shoulder, not elsewhere classified: Secondary | ICD-10-CM

## 2016-06-01 NOTE — Therapy (Signed)
[place], [place], [address] Phone: [phone]   Fax:  [phone]  Occupational Therapy Treatment  Patient Details  Name: Jennifer Coleman MRN: [id number] Date of Birth: [birth year] Referring Provider: Dr. Joni Fears  Encounter Date: 06/01/2016      OT End of Session - 06/01/16 0901    Visit Number 6   Number of Visits 24   Date for OT Re-Evaluation 07/13/16   Authorization Type UMR   OT Start Time 0820   OT Stop Time 0902   OT Time Calculation (min) 42 min   Activity Tolerance Patient tolerated treatment well   Behavior During Therapy [facility] for tasks assessed/performed      Past Medical History:  Diagnosis Date  . Depression   . Family history of anesthesia complication 25 yrs ago   Sister allergic to sccinocholine - unable to come off ventilator  . Hypertension   . PONV (postoperative nausea and vomiting)   . Shingles   . Varicose veins     Past Surgical History:  Procedure Laterality Date  . CARPAL TUNNEL RELEASE    . CERVICAL DISC SURGERY  1992   Fusion  . KNEE ARTHROSCOPY  09/16/2012   Procedure: ARTHROSCOPY KNEE;  Surgeon: [name], MD;  Location: [place];  Service: Orthopedics;  Laterality: Right;  Right Knee Arthroscopy  . SALPINGOOPHORECTOMY Left 1985  . SHOULDER ARTHROSCOPY WITH OPEN ROTATOR CUFF REPAIR AND DISTAL CLAVICLE ACROMINECTOMY Right 05/03/2016   Procedure: RIGHT SHOULDER ARTHROSCOPY WITH MINI-OPEN ROTATOR CUFF REPAIR,DISTAL CLAVICLE RESECTION, SUBACROMIAL DECOMPRESSION.;  Surgeon: [name], MD;  Location: [place];  Service: Orthopedics;  Laterality: Right;    There were no vitals filed for this visit.      Subjective Assessment - 06/01/16 0851    Subjective  S: Jennifer Coleman was a really hard day. My shoulder just felt really stiff.    Currently in Pain? Yes   Pain Score 4    Pain Location Shoulder   Pain Orientation Right   Pain Descriptors /  Indicators Aching   Pain Type Acute pain            [facility] OT Assessment - 06/01/16 0852      Assessment   Diagnosis S/P Right SAD, DCE, RCR and biceps tenodesis     Precautions   Precautions Shoulder   Type of Shoulder Precautions Dr. Durward Fortes Protocol:  PROM for 6 weeks ( through 8/17)                  OT Treatments/Exercises (OP) - 06/01/16 0850      Exercises   Exercises Shoulder;Elbow     Shoulder Exercises: Supine   Protraction PROM;10 reps   Horizontal ABduction PROM;10 reps   External Rotation PROM;10 reps   Internal Rotation PROM;10 reps   Flexion PROM;10 reps   ABduction PROM;10 reps     Shoulder Exercises: Seated   Elevation AROM;12 reps   Extension AROM;12 reps   Row AROM;12 reps     Shoulder Exercises: Therapy Ball   Flexion --  12X   ABduction --  12X     Manual Therapy   Manual Therapy Myofascial release   Manual therapy comments manual therapy completed seperately from all other interventions this date   Myofascial Release myofascial release and manual stretching to right upper arm, scapular, shoulder region and associated areas to decrease pain and restrictions and imrpove pain free mobility in right shoulder.  OT Education - 06/01/16 0900    Education provided Yes   Education Details Educated patient on using tennis ball to complete self myofascial release   Person(s) Educated Patient   Methods Explanation;Demonstration   Comprehension Verbalized understanding          OT Short Term Goals - 05/18/16 1415      OT SHORT TERM GOAL #1   Title Patient will be educated on a HEP for right shoulder ROM and strength.   Time 6   Period Weeks   Status On-going     OT SHORT TERM GOAL #2   Title Patient will improve right shoulder and elbow P/ROM to [facility] in order to complete B/ADLs with increased independence.   Time 6   Period Weeks   Status On-going     OT SHORT TERM GOAL #3   Title Patient will  improve right shoulder and elbow strength to 3+/5 for increased ability to pick up baskets of laundry.   Time 6   Period Weeks   Status On-going     OT SHORT TERM GOAL #4   Title Patient will decrease right shoulder pain to 4/10 or better in order to complete B/ADLs with greater independence.   Time 6   Period Weeks   Status On-going     OT SHORT TERM GOAL #5   Title Patient will decrease fascial restrictions to moderate in right shoulder region for greater mobility needed for greater use of right arm as dominant.    Time 6   Period Weeks   Status On-going           OT Long Term Goals - 05/18/16 1415      OT LONG TERM GOAL #1   Title Patient will return to prior level of independence with all B/IADLs, work, and leisure activities using her right arm as dominant.    Time 12   Period Weeks   Status On-going     OT LONG TERM GOAL #2   Title Patient will improve right shoulder and elbow A/ROM to WNL for greater ability to use right arm as dominant at work and home.    Time 12   Period Weeks   Status On-going     OT LONG TERM GOAL #3   Title Patient will improve right shoulder and elbow strength to 5/5 for greater ability to lift bags of mulch when completing yardwork.     Time 12   Period Weeks   Status On-going     OT LONG TERM GOAL #4   Title Patient will decrease right shoulder pain to 2/10 or better when completing functional actiivites.    Time 12   Period Weeks   Status On-going     OT LONG TERM GOAL #5   Title Paitent will decrease right fascial restrictions in right shoulder in order to have greater mobility needed for use of right arm with functional activiites.     Time 12   Period Weeks   Status On-going               Plan - 06/01/16 0920    Clinical Impression Statement A: Applied gentle traction during passive shoulder flexion and abduction which decreased pain and allowed for slightly further ROM. VC for form and technique.   Plan P: Continue  to follow protocol completing P/ROM. Increase passive ROM to [facility] while completing gentle traction during passive flexion and abduction.       Patient will benefit  from skilled therapeutic intervention in order to improve the following deficits and impairments:  Decreased range of motion, Decreased skin integrity, Decreased scar mobility, Decreased strength, Increased muscle spasms, Increased fascial restricitons, Impaired UE functional use, Pain  Visit Diagnosis: Stiffness of right shoulder, not elsewhere classified  Pain in right shoulder  Other symptoms and signs involving the musculoskeletal system    Problem List Patient Active Problem List   Diagnosis Date Noted  . Epigastric pain 08/11/2015  . Chest pain, rule out acute myocardial infarction 08/11/2015  . Renal insufficiency 08/11/2015  . Venous stasis 04/13/2015  . Depression 04/13/2015  . Essential hypertension 09/12/2014  . PVCs (premature ventricular contractions) 09/07/2014  . Precordial pain 09/07/2014   Ailene Ravel, OTR/L,CBIS  [phone]  06/01/2016, 9:25 AM  [place] [address] [place], [place], [address] Phone: [phone]   Fax:  [phone]  Name: Jennifer Coleman MRN: [id number] Date of Birth: 20-Sep-1951

## 2016-06-05 ENCOUNTER — Ambulatory Visit (HOSPITAL_COMMUNITY): Payer: 59

## 2016-06-05 ENCOUNTER — Encounter (HOSPITAL_COMMUNITY): Payer: Self-pay

## 2016-06-05 DIAGNOSIS — M25511 Pain in right shoulder: Secondary | ICD-10-CM | POA: Diagnosis not present

## 2016-06-05 DIAGNOSIS — M25621 Stiffness of right elbow, not elsewhere classified: Secondary | ICD-10-CM | POA: Diagnosis not present

## 2016-06-05 DIAGNOSIS — R29898 Other symptoms and signs involving the musculoskeletal system: Secondary | ICD-10-CM

## 2016-06-05 DIAGNOSIS — M25611 Stiffness of right shoulder, not elsewhere classified: Secondary | ICD-10-CM

## 2016-06-05 NOTE — Therapy (Signed)
[place], [place], [address] Phone: [phone]   Fax:  [phone]  Occupational Therapy Treatment  Patient Details  Name: LORIANNA SPADACCINI MRN: [id number] Date of Birth: [birth year] Referring Provider: Dr. Joni Fears  Encounter Date: 06/05/2016      OT End of Session - 06/05/16 1424    Visit Number 7   Number of Visits 24   Date for OT Re-Evaluation 07/13/16   Authorization Type UMR   OT Start Time 1344   OT Stop Time 1430   OT Time Calculation (min) 46 min   Activity Tolerance Patient tolerated treatment well   Behavior During Therapy [facility] for tasks assessed/performed      Past Medical History:  Diagnosis Date  . Depression   . Family history of anesthesia complication 25 yrs ago   Sister allergic to sccinocholine - unable to come off ventilator  . Hypertension   . PONV (postoperative nausea and vomiting)   . Shingles   . Varicose veins     Past Surgical History:  Procedure Laterality Date  . CARPAL TUNNEL RELEASE    . CERVICAL DISC SURGERY  1992   Fusion  . KNEE ARTHROSCOPY  09/16/2012   Procedure: ARTHROSCOPY KNEE;  Surgeon: [name], MD;  Location: [place];  Service: Orthopedics;  Laterality: Right;  Right Knee Arthroscopy  . SALPINGOOPHORECTOMY Left 1985  . SHOULDER ARTHROSCOPY WITH OPEN ROTATOR CUFF REPAIR AND DISTAL CLAVICLE ACROMINECTOMY Right 05/03/2016   Procedure: RIGHT SHOULDER ARTHROSCOPY WITH MINI-OPEN ROTATOR CUFF REPAIR,DISTAL CLAVICLE RESECTION, SUBACROMIAL DECOMPRESSION.;  Surgeon: [name], MD;  Location: [place];  Service: Orthopedics;  Laterality: Right;    There were no vitals filed for this visit.      Subjective Assessment - 06/05/16 1409    Currently in Pain? Yes   Pain Score 5    Pain Location Shoulder   Pain Orientation Right   Pain Descriptors / Indicators Aching   Pain Type Acute pain   Pain Radiating Towards neck down to bicep    Pain Onset 1 to 4 weeks ago   Pain Frequency Constant   Aggravating Factors  Nothing   Pain Relieving Factors rest, medication, ice, massage, stretch   Effect of Pain on Daily Activities limited use of RUE with ADL tasks            [facility] OT Assessment - 06/05/16 1408      Assessment   Diagnosis S/P Right SAD, DCE, RCR and biceps tenodesis     Precautions   Precautions Shoulder   Type of Shoulder Precautions Dr. Durward Fortes Protocol:  PROM for 6 weeks ( through 8/17)                  OT Treatments/Exercises (OP) - 06/05/16 1407      Exercises   Exercises Shoulder;Elbow     Shoulder Exercises: Supine   Protraction PROM;10 reps   Horizontal ABduction PROM;10 reps   External Rotation PROM;10 reps   Internal Rotation PROM;10 reps   Flexion PROM;10 reps   ABduction PROM;10 reps     Shoulder Exercises: Seated   Elevation AROM;12 reps   Extension AROM;12 reps   Row AROM;12 reps     Shoulder Exercises: Therapy Ball   Flexion --  12X   ABduction --  12X     Shoulder Exercises: ROM/Strengthening   Thumb Tacks 1'   Anterior Glide 3x10"   Caudal Glide 3x10"  Prot/Ret//Elev/Dep 1'     Manual Therapy   Manual Therapy Myofascial release   Manual therapy comments manual therapy completed seperately from all other interventions this date   Myofascial Release myofascial release and manual stretching to right upper arm, scapular, shoulder region and associated areas to decrease pain and restrictions and imrpove pain free mobility in right shoulder.     Muscle Energy Technique Muscle energy  technique completed to right anterior deltoid to relax tone and muscle spasm and improve range of motion.                    OT Short Term Goals - 05/18/16 1415      OT SHORT TERM GOAL #1   Title Patient will be educated on a HEP for right shoulder ROM and strength.   Time 6   Period Weeks   Status On-going     OT SHORT TERM GOAL #2   Title Patient will  improve right shoulder and elbow P/ROM to [facility] in order to complete B/ADLs with increased independence.   Time 6   Period Weeks   Status On-going     OT SHORT TERM GOAL #3   Title Patient will improve right shoulder and elbow strength to 3+/5 for increased ability to pick up baskets of laundry.   Time 6   Period Weeks   Status On-going     OT SHORT TERM GOAL #4   Title Patient will decrease right shoulder pain to 4/10 or better in order to complete B/ADLs with greater independence.   Time 6   Period Weeks   Status On-going     OT SHORT TERM GOAL #5   Title Patient will decrease fascial restrictions to moderate in right shoulder region for greater mobility needed for greater use of right arm as dominant.    Time 6   Period Weeks   Status On-going           OT Long Term Goals - 05/18/16 1415      OT LONG TERM GOAL #1   Title Patient will return to prior level of independence with all B/IADLs, work, and leisure activities using her right arm as dominant.    Time 12   Period Weeks   Status On-going     OT LONG TERM GOAL #2   Title Patient will improve right shoulder and elbow A/ROM to WNL for greater ability to use right arm as dominant at work and home.    Time 12   Period Weeks   Status On-going     OT LONG TERM GOAL #3   Title Patient will improve right shoulder and elbow strength to 5/5 for greater ability to lift bags of mulch when completing yardwork.     Time 12   Period Weeks   Status On-going     OT LONG TERM GOAL #4   Title Patient will decrease right shoulder pain to 2/10 or better when completing functional actiivites.    Time 12   Period Weeks   Status On-going     OT LONG TERM GOAL #5   Title Paitent will decrease right fascial restrictions in right shoulder in order to have greater mobility needed for use of right arm with functional activiites.     Time 12   Period Weeks   Status On-going               Plan - 06/05/16 1426    Clinical  Impression  Statement A: Added thumb tacks and pro/ret/elev/dep to work on shoulder and scapular mobility. patient was able to complete with modifications (thumb tacks using chair railing). VC for form and technique.   Plan P: Continue to work on increasing P/ROM to [facility] and complete P/ROM per protocol.      Patient will benefit from skilled therapeutic intervention in order to improve the following deficits and impairments:  Decreased range of motion, Decreased skin integrity, Decreased scar mobility, Decreased strength, Increased muscle spasms, Increased fascial restricitons, Impaired UE functional use, Pain  Visit Diagnosis: Stiffness of right shoulder, not elsewhere classified  Pain in right shoulder  Other symptoms and signs involving the musculoskeletal system  Stiffness of right elbow, not elsewhere classified    Problem List Patient Active Problem List   Diagnosis Date Noted  . Epigastric pain 08/11/2015  . Chest pain, rule out acute myocardial infarction 08/11/2015  . Renal insufficiency 08/11/2015  . Venous stasis 04/13/2015  . Depression 04/13/2015  . Essential hypertension 09/12/2014  . PVCs (premature ventricular contractions) 09/07/2014  . Precordial pain 09/07/2014   Ailene Ravel, OTR/L,CBIS  [phone]  06/05/2016, 2:40 PM  [place] [address] [place], [place], [address] Phone: [phone]   Fax:  [phone]  Name: JACINDA KANADY MRN: [id number] Date of Birth: [birth year]

## 2016-06-06 ENCOUNTER — Encounter (HOSPITAL_COMMUNITY): Payer: 59 | Admitting: Occupational Therapy

## 2016-06-07 ENCOUNTER — Encounter (HOSPITAL_COMMUNITY): Payer: Self-pay

## 2016-06-07 ENCOUNTER — Ambulatory Visit (HOSPITAL_COMMUNITY)
Admission: RE | Admit: 2016-06-07 | Discharge: 2016-06-07 | Disposition: A | Discharge status: Home / Self Care | Payer: 59 | Source: Ambulatory Visit | Attending: Internal Medicine | Admitting: Internal Medicine

## 2016-06-07 ENCOUNTER — Encounter (HOSPITAL_COMMUNITY)
Admission: RE | Disposition: A | Discharge status: Home / Self Care | Payer: Self-pay | Source: Ambulatory Visit | Attending: Internal Medicine

## 2016-06-07 DIAGNOSIS — K5730 Diverticulosis of large intestine without perforation or abscess without bleeding: Secondary | ICD-10-CM | POA: Insufficient documentation

## 2016-06-07 DIAGNOSIS — Z79899 Other long term (current) drug therapy: Secondary | ICD-10-CM | POA: Diagnosis not present

## 2016-06-07 DIAGNOSIS — R197 Diarrhea, unspecified: Secondary | ICD-10-CM

## 2016-06-07 DIAGNOSIS — Z1211 Encounter for screening for malignant neoplasm of colon: Secondary | ICD-10-CM | POA: Insufficient documentation

## 2016-06-07 DIAGNOSIS — Z885 Allergy status to narcotic agent status: Secondary | ICD-10-CM | POA: Insufficient documentation

## 2016-06-07 DIAGNOSIS — K644 Residual hemorrhoidal skin tags: Secondary | ICD-10-CM | POA: Insufficient documentation

## 2016-06-07 DIAGNOSIS — Z87891 Personal history of nicotine dependence: Secondary | ICD-10-CM | POA: Diagnosis not present

## 2016-06-07 DIAGNOSIS — Z880 Allergy status to penicillin: Secondary | ICD-10-CM | POA: Insufficient documentation

## 2016-06-07 DIAGNOSIS — F329 Major depressive disorder, single episode, unspecified: Secondary | ICD-10-CM | POA: Diagnosis not present

## 2016-06-07 DIAGNOSIS — R195 Other fecal abnormalities: Secondary | ICD-10-CM

## 2016-06-07 HISTORY — PX: COLONOSCOPY: SHX5424

## 2016-06-07 SURGERY — COLONOSCOPY
Anesthesia: Moderate Sedation

## 2016-06-07 MED ORDER — SODIUM CHLORIDE 0.9 % IV SOLN
INTRAVENOUS | Status: DC
  Administered 2016-06-07: 13:00:00 via INTRAVENOUS

## 2016-06-07 MED ORDER — MEPERIDINE HCL 50 MG/ML IJ SOLN
INTRAMUSCULAR | Status: AC
  Filled 2016-06-07: qty 1

## 2016-06-07 MED ORDER — MIDAZOLAM HCL 5 MG/5ML IJ SOLN
INTRAMUSCULAR | Status: DC | PRN
  Administered 2016-06-07 (×3): 2 mg via INTRAVENOUS
  Administered 2016-06-07 (×2): 1 mg via INTRAVENOUS

## 2016-06-07 MED ORDER — MEPERIDINE HCL 50 MG/ML IJ SOLN
INTRAMUSCULAR | Status: DC | PRN
  Administered 2016-06-07 (×2): 25 mg via INTRAVENOUS

## 2016-06-07 MED ORDER — MIDAZOLAM HCL 5 MG/5ML IJ SOLN
INTRAMUSCULAR | Status: AC
  Filled 2016-06-07: qty 10

## 2016-06-07 MED ORDER — STERILE WATER FOR IRRIGATION IR SOLN
Status: DC | PRN
  Administered 2016-06-07: 13:00:00

## 2016-06-07 NOTE — Discharge Instructions (Signed)
Resume usual medications and high fiber diet. No driving for 24 hours. Next screening exam in 10 years.   Colonoscopy, Care After These instructions give you information on caring for yourself after your procedure. Your doctor may also give you more specific instructions. Call your doctor if you have any problems or questions after your procedure. HOME CARE  Do not drive for 24 hours.  Do not sign important papers or use machinery for 24 hours.  You may shower.  You may go back to your usual activities, but go slower for the first 24 hours.  Take rest breaks often during the first 24 hours.  Walk around or use warm packs on your belly (abdomen) if you have belly cramping or gas.  Drink enough fluids to keep your pee (urine) clear or pale yellow.  Resume your normal diet. Avoid heavy or fried foods.  Avoid drinking alcohol for 24 hours or as told by your doctor.  Only take medicines as told by your doctor. If a tissue sample (biopsy) was taken during the procedure:   Do not take aspirin or blood thinners for 7 days, or as told by your doctor.  Do not drink alcohol for 7 days, or as told by your doctor.  Eat soft foods for the first 24 hours. GET HELP IF: You still have a small amount of blood in your poop (stool) 2-3 days after the procedure. GET HELP RIGHT AWAY IF:  You have more than a small amount of blood in your poop.  You see clumps of tissue (blood clots) in your poop.  Your belly is puffy (swollen).  You feel sick to your stomach (nauseous) or throw up (vomit).  You have a fever.  You have belly pain that gets worse and medicine does not help. MAKE SURE YOU:  Understand these instructions.  Will watch your condition.  Will get help right away if you are not doing well or get worse.   This information is not intended to replace advice given to you by your health care provider. Make sure you discuss any questions you have with your health care  provider.   Document Released: 11/17/2010 Document Revised: 10/20/2013 Document Reviewed: 06/22/2013 Elsevier Interactive Patient Education 2016 [employer].  Hemorrhoids Hemorrhoids are swollen veins around the rectum or anus. There are two types of hemorrhoids:   Internal hemorrhoids. These occur in the veins just inside the rectum. They may poke through to the outside and become irritated and painful.  External hemorrhoids. These occur in the veins outside the anus and can be felt as a painful swelling or hard lump near the anus. CAUSES  Pregnancy.   Obesity.   Constipation or diarrhea.   Straining to have a bowel movement.   Sitting for long periods on the toilet.  Heavy lifting or other activity that caused you to strain.  Anal intercourse. SYMPTOMS   Pain.   Anal itching or irritation.   Rectal bleeding.   Fecal leakage.   Anal swelling.   One or more lumps around the anus.  DIAGNOSIS  Your caregiver may be able to diagnose hemorrhoids by visual examination. Other examinations or tests that may be performed include:   Examination of the rectal area with a gloved hand (digital rectal exam).   Examination of anal canal using a small tube (scope).   A blood test if you have lost a significant amount of blood.  A test to look inside the colon (sigmoidoscopy or colonoscopy). TREATMENT Most  hemorrhoids can be treated at home. However, if symptoms do not seem to be getting better or if you have a lot of rectal bleeding, your caregiver may perform a procedure to help make the hemorrhoids get smaller or remove them completely. Possible treatments include:   Placing a rubber band at the base of the hemorrhoid to cut off the circulation (rubber band ligation).   Injecting a chemical to shrink the hemorrhoid (sclerotherapy).   Using a tool to burn the hemorrhoid (infrared light therapy).   Surgically removing the hemorrhoid (hemorrhoidectomy).    Stapling the hemorrhoid to block blood flow to the tissue (hemorrhoid stapling).  HOME CARE INSTRUCTIONS   Eat foods with fiber, such as whole grains, beans, nuts, fruits, and vegetables. Ask your doctor about taking products with added fiber in them (fibersupplements).  Increase fluid intake. Drink enough water and fluids to keep your urine clear or pale yellow.   Exercise regularly.   Go to the bathroom when you have the urge to have a bowel movement. Do not wait.   Avoid straining to have bowel movements.   Keep the anal area dry and clean. Use wet toilet paper or moist towelettes after a bowel movement.   Medicated creams and suppositories may be used or applied as directed.   Only take over-the-counter or prescription medicines as directed by your caregiver.   Take warm sitz baths for 15-20 minutes, 3-4 times a day to ease pain and discomfort.   Place ice packs on the hemorrhoids if they are tender and swollen. Using ice packs between sitz baths may be helpful.   Put ice in a plastic bag.   Place a towel between your skin and the bag.   Leave the ice on for 15-20 minutes, 3-4 times a day.   Do not use a donut-shaped pillow or sit on the toilet for long periods. This increases blood pooling and pain.  SEEK MEDICAL CARE IF:  You have increasing pain and swelling that is not controlled by treatment or medicine.  You have uncontrolled bleeding.  You have difficulty or you are unable to have a bowel movement.  You have pain or inflammation outside the area of the hemorrhoids. MAKE SURE YOU:  Understand these instructions.  Will watch your condition.  Will get help right away if you are not doing well or get worse.   This information is not intended to replace advice given to you by your health care provider. Make sure you discuss any questions you have with your health care provider.   Document Released: 10/12/2000 Document Revised: 10/01/2012  Document Reviewed: 08/19/2012 Elsevier Interactive Patient Education 2016 [employer].   Diverticulosis Diverticulosis is the condition that develops when small pouches (diverticula) form in the wall of your colon. Your colon, or large intestine, is where water is absorbed and stool is formed. The pouches form when the inside layer of your colon pushes through weak spots in the outer layers of your colon. CAUSES  No one knows exactly what causes diverticulosis. RISK FACTORS  Being older than 51. Your risk for this condition increases with age. Diverticulosis is rare in people younger than 40 years. By age 16, almost everyone has it.  Eating a low-fiber diet.  Being frequently constipated.  Being overweight.  Not getting enough exercise.  Smoking.  Taking over-the-counter pain medicines, like aspirin and ibuprofen. SYMPTOMS  Most people with diverticulosis do not have symptoms. DIAGNOSIS  Because diverticulosis often has no symptoms, health care providers  often discover the condition during an exam for other colon problems. In many cases, a health care provider will diagnose diverticulosis while using a flexible scope to examine the colon (colonoscopy). TREATMENT  If you have never developed an infection related to diverticulosis, you may not need treatment. If you have had an infection before, treatment may include:  Eating more fruits, vegetables, and grains.  Taking a fiber supplement.  Taking a live bacteria supplement (probiotic).  Taking medicine to relax your colon. HOME CARE INSTRUCTIONS   Drink at least 6-8 glasses of water each day to prevent constipation.  Try not to strain when you have a bowel movement.  Keep all follow-up appointments. If you have had an infection before:  Increase the fiber in your diet as directed by your health care provider or dietitian.  Take a dietary fiber supplement if your health care provider approves.  Only take medicines  as directed by your health care provider. SEEK MEDICAL CARE IF:   You have abdominal pain.  You have bloating.  You have cramps.  You have not gone to the bathroom in 3 days. SEEK IMMEDIATE MEDICAL CARE IF:   Your pain gets worse.  Yourbloating becomes very bad.  You have a fever or chills, and your symptoms suddenly get worse.  You begin vomiting.  You have bowel movements that are bloody or black. MAKE SURE YOU:  Understand these instructions.  Will watch your condition.  Will get help right away if you are not doing well or get worse.   This information is not intended to replace advice given to you by your health care provider. Make sure you discuss any questions you have with your health care provider.   Document Released: 07/12/2004 Document Revised: 10/20/2013 Document Reviewed: 09/09/2013 Elsevier Interactive Patient Education [employer].

## 2016-06-07 NOTE — H&P (Signed)
Jennifer Coleman is an 65 y.o. female.   Chief Complaint: Patient is here for colonoscopy. HPI: Patient is 65 year old Caucasian female who is in for screening colonoscopy. Last exam was 11 years ago. She denies rectal bleeding abdominal pain. Over 2 months ago she was evaluated for diarrhea after she took clindamycin. He was treated with metronidazole with resolution of her diarrhea. She is not having normal stools. Family history is negative for CRC.  Past Medical History:  Diagnosis Date  . Depression   . Family history of anesthesia complication 25 yrs ago   Sister allergic to sccinocholine - unable to come off ventilator  . Hypertension   . PONV (postoperative nausea and vomiting)   . Shingles   . Varicose veins     Past Surgical History:  Procedure Laterality Date  . CARPAL TUNNEL RELEASE    . CERVICAL DISC SURGERY  1992   Fusion  . KNEE ARTHROSCOPY  09/16/2012   Procedure: ARTHROSCOPY KNEE;  Surgeon: [name], MD;  Location: [place];  Service: Orthopedics;  Laterality: Right;  Right Knee Arthroscopy  . SALPINGOOPHORECTOMY Left 1985  . SHOULDER ARTHROSCOPY WITH OPEN ROTATOR CUFF REPAIR AND DISTAL CLAVICLE ACROMINECTOMY Right 05/03/2016   Procedure: RIGHT SHOULDER ARTHROSCOPY WITH MINI-OPEN ROTATOR CUFF REPAIR,DISTAL CLAVICLE RESECTION, SUBACROMIAL DECOMPRESSION.;  Surgeon: [name], MD;  Location: [place];  Service: Orthopedics;  Laterality: Right;    Family History  Problem Relation Age of Onset  . Heart disease Sister   . Heart disease Brother     3's  . Diabetes Mellitus II Sister   . Multiple myeloma Mother   . Hypertension Sister    Social History:  reports that she has quit smoking. Her smoking use included Cigarettes. She quit smokeless tobacco use about 19 years ago. She reports that she does not drink alcohol or use drugs.  Allergies:  Allergies  Allergen Reactions  . Penicillins Shortness Of Breath, Swelling and Rash  . Shellfish  Allergy Shortness Of Breath and Swelling  . Morphine And Related Nausea And Vomiting  . Codeine Itching    Medications Prior to Admission  Medication Sig Dispense Refill  . Calcium Carbonate-Vit D-Min (GNP CALCIUM PLUS 600 +D PO) Take 1 tablet by mouth 2 (two) times daily.    . cetirizine (ZYRTEC) 10 MG tablet Take 10 mg by mouth daily as needed for allergies.     Marland Kitchen diclofenac sodium (VOLTAREN) 1 % GEL Apply to right knee 3 times a day as needed. (Patient taking differently: Apply to right knee 3 times a day as needed for muscular pain) 1 Tube 1  . dicyclomine (BENTYL) 10 MG capsule Take 1 capsule by mouth 2 (two) times daily. Reported on 05/14/2016  5  . enalapril (VASOTEC) 20 MG tablet Take 1 tablet by mouth twice a day. 180 tablet 1  . escitalopram (LEXAPRO) 20 MG tablet Take 1 tablet (20 mg total) by mouth daily. (Patient taking differently: Take 10 mg by mouth 2 (two) times daily. ) 90 tablet 1  . Estradiol-Norethindrone Acet 0.5-0.1 MG tablet   0  . fluticasone (FLONASE) 50 MCG/ACT nasal spray 2 sprays as needed for allergies or rhinitis.    . hydrochlorothiazide (HYDRODIURIL) 25 MG tablet Take 1 tablet (25 mg total) by mouth daily. 30 tablet 5  . ibuprofen (ADVIL,MOTRIN) 800 MG tablet TAKE ONE TABLET BY MOUTH THREE TIMES DAILY AS NEEDED. 90 tablet 3  . Multiple Vitamins-Minerals (MULTIVITAMINS THER. W/MINERALS) TABS Take 1 tablet  by mouth daily.    Marland Kitchen oxyCODONE-acetaminophen (ROXICET) 5-325 MG tablet Take 1-2 tablets by mouth every 4 (four) hours as needed for moderate pain or severe pain. 50 tablet 0  . nitroGLYCERIN (NITROSTAT) 0.4 MG SL tablet Place 1 tablet (0.4 mg total) under the tongue every 5 (five) minutes as needed for chest pain. 30 tablet 0    No results found for this or any previous visit (from the past 48 hour(s)). No results found.  ROS  Blood pressure (!) 149/82, pulse 72, temperature 98 F (36.7 C), temperature source Oral, resp. rate (!) 21, height '5\' 5"'$  (1.651 m),  weight 222 lb (100.7 kg), SpO2 99 %. Physical Exam  Constitutional: She appears well-developed and well-nourished.  HENT:  Mouth/Throat: Oropharynx is clear and moist.  Eyes: Conjunctivae are normal.  Neck: No thyromegaly present.  Cardiovascular: Normal rate, regular rhythm and normal heart sounds.   No murmur heard. Respiratory: Effort normal and breath sounds normal.  GI: Soft. She exhibits no distension and no mass. There is no tenderness.  Musculoskeletal: She exhibits no edema.  Has right arm sling.  Lymphadenopathy:    She has no cervical adenopathy.  Neurological: She is alert.  Skin: Skin is warm and dry.     Assessment/Plan Average risk screening colonoscopy.  Hildred Laser, MD 06/07/2016, 12:41 PM

## 2016-06-07 NOTE — Op Note (Signed)
[facility] Patient Name: Jennifer Coleman Procedure Date: 06/07/2016 12:31 PM MRN: [id number] Date of Birth: 04/04/1951 Attending MD: Hildred Laser , MD CSN: [id number] Age: 65 Admit Type: Outpatient Procedure:                Colonoscopy Indications:              Screening for colorectal malignant neoplasm Providers:                Hildred Laser, MD, Otis Peak B. Sharon Seller, RN, Isabella Stalling, Technician Referring MD:             Grace Bushy. Wolfgang Phoenix, MD Medicines:                Meperidine 50 mg IV, Midazolam 8 mg IV Complications:            No immediate complications. Estimated Blood Loss:     Estimated blood loss: none. Procedure:                Pre-Anesthesia Assessment:                           - Prior to the procedure, a History and Physical                            was performed, and patient medications and                            allergies were reviewed. The patient's tolerance of                            previous anesthesia was also reviewed. The risks                            and benefits of the procedure and the sedation                            options and risks were discussed with the patient.                            All questions were answered, and informed consent                            was obtained. Prior Anticoagulants: The patient                            last took ibuprofen 2 days prior to the procedure.                            ASA Grade Assessment: II - A patient with mild                            systemic disease. After reviewing the risks and  benefits, the patient was deemed in satisfactory                            condition to undergo the procedure.                           After obtaining informed consent, the colonoscope                            was passed under direct vision. Throughout the                            procedure, the patient's blood pressure, pulse, and                 oxygen saturations were monitored continuously. The                            [id number]) scope was introduced through                            the anus and advanced to the the cecum, identified                            by appendiceal orifice and ileocecal valve. The                            colonoscopy was performed without difficulty. The                            patient tolerated the procedure well. The quality                            of the bowel preparation was excellent. The                            ileocecal valve, appendiceal orifice, and rectum                            were photographed. Scope In: 12:53:14 PM Scope Out: 1:10:11 PM Scope Withdrawal Time: 0 hours 7 minutes 35 seconds  Total Procedure Duration: 0 hours 16 minutes 57 seconds  Findings:      The transverse colon, hepatic flexure, ascending colon, cecum,       appendiceal orifice and ileocecal valve appeared normal.      Scattered small and large-mouthed diverticula were found in the sigmoid       colon, descending colon and splenic flexure.      External hemorrhoids were found during retroflexion. The hemorrhoids       were small. Impression:               - The transverse colon, hepatic flexure, ascending                            colon, cecum, appendiceal orifice and ileocecal  valve are normal.                           - Diverticulosis in the sigmoid colon, in the                            descending colon and at the splenic flexure.                           - External hemorrhoids.                           - No specimens collected. Moderate Sedation:      Moderate (conscious) sedation was administered by the endoscopy nurse       and supervised by the endoscopist. The following parameters were       monitored: oxygen saturation, heart rate, blood pressure, CO2       capnography and response to care. Total physician intraservice time was        22 minutes. Recommendation:           - Patient has a contact number available for                            emergencies. The signs and symptoms of potential                            delayed complications were discussed with the                            patient. Return to normal activities tomorrow.                            Written discharge instructions were provided to the                            patient.                           - High fiber diet today.                           - Continue present medications.                           - Repeat colonoscopy in 10 years for screening                            purposes. Procedure Code(s):        --- Professional ---                           [phone], Colonoscopy, flexible; diagnostic, including                            collection of specimen(s) by brushing or washing,  when performed (separate procedure)                           99152, Moderate sedation services provided by the                            same physician or other qualified health care                            professional performing the diagnostic or                            therapeutic service that the sedation supports,                            requiring the presence of an independent trained                            observer to assist in the monitoring of the                            patient's level of consciousness and physiological                            status; initial 15 minutes of intraservice time,                            patient age 58 years or older Diagnosis Code(s):        --- Professional ---                           Z12.11, Encounter for screening for malignant                            neoplasm of colon                           K64.4, Residual hemorrhoidal skin tags                           K57.30, Diverticulosis of large intestine without                            perforation or abscess without  bleeding CPT copyright 2016 American Medical Association. All rights reserved. The codes documented in this report are preliminary and upon coder review may  be revised to meet current compliance requirements. Hildred Laser, MD Hildred Laser, MD 06/07/2016 1:17:39 PM This report has been signed electronically. Number of Addenda: 0

## 2016-06-08 ENCOUNTER — Ambulatory Visit (HOSPITAL_COMMUNITY): Payer: 59 | Admitting: Occupational Therapy

## 2016-06-08 ENCOUNTER — Encounter (HOSPITAL_COMMUNITY): Payer: 59

## 2016-06-08 ENCOUNTER — Encounter (HOSPITAL_COMMUNITY): Payer: Self-pay | Admitting: Occupational Therapy

## 2016-06-08 DIAGNOSIS — M25611 Stiffness of right shoulder, not elsewhere classified: Secondary | ICD-10-CM | POA: Diagnosis not present

## 2016-06-08 DIAGNOSIS — R29898 Other symptoms and signs involving the musculoskeletal system: Secondary | ICD-10-CM

## 2016-06-08 DIAGNOSIS — M25511 Pain in right shoulder: Secondary | ICD-10-CM

## 2016-06-08 DIAGNOSIS — M25621 Stiffness of right elbow, not elsewhere classified: Secondary | ICD-10-CM | POA: Diagnosis not present

## 2016-06-08 NOTE — Therapy (Signed)
[place], [place], [address] Phone: [phone]   Fax:  [phone]  Occupational Therapy Treatment  Patient Details  Name: Jennifer Coleman MRN: [id number] Date of Birth: [birth year] Referring Provider: Dr. Joni Fears  Encounter Date: 06/08/2016      OT End of Session - 06/08/16 1428    Visit Number 8   Number of Visits 24   Date for OT Re-Evaluation 07/13/16   Authorization Type UMR   OT Start Time 1308  pt arrived late   OT Stop Time 1350   OT Time Calculation (min) 42 min   Activity Tolerance Patient tolerated treatment well   Behavior During Therapy [facility] for tasks assessed/performed      Past Medical History:  Diagnosis Date  . Depression   . Family history of anesthesia complication 25 yrs ago   Sister allergic to sccinocholine - unable to come off ventilator  . Hypertension   . PONV (postoperative nausea and vomiting)   . Shingles   . Varicose veins     Past Surgical History:  Procedure Laterality Date  . CARPAL TUNNEL RELEASE    . CERVICAL DISC SURGERY  1992   Fusion  . KNEE ARTHROSCOPY  09/16/2012   Procedure: ARTHROSCOPY KNEE;  Surgeon: [name], MD;  Location: [place];  Service: Orthopedics;  Laterality: Right;  Right Knee Arthroscopy  . SALPINGOOPHORECTOMY Left 1985  . SHOULDER ARTHROSCOPY WITH OPEN ROTATOR CUFF REPAIR AND DISTAL CLAVICLE ACROMINECTOMY Right 05/03/2016   Procedure: RIGHT SHOULDER ARTHROSCOPY WITH MINI-OPEN ROTATOR CUFF REPAIR,DISTAL CLAVICLE RESECTION, SUBACROMIAL DECOMPRESSION.;  Surgeon: [name], MD;  Location: [place];  Service: Orthopedics;  Laterality: Right;    There were no vitals filed for this visit.      Subjective Assessment - 06/08/16 1310    Currently in Pain? Yes   Pain Score 5    Pain Location Shoulder   Pain Orientation Right   Pain Descriptors / Indicators Aching   Pain Type Acute pain   Pain Radiating Towards  neck to bicep   Pain Onset 1 to 4 weeks ago   Pain Frequency Constant   Aggravating Factors  nothing   Pain Relieving Factors rest, medications, ice, message, stretch   Effect of Pain on Daily Activities limited use of RUE during ADL tasks   Multiple Pain Sites No            [facility] OT Assessment - 06/08/16 1309      Assessment   Diagnosis S/P Right SAD, DCE, RCR and biceps tenodesis     Precautions   Precautions Shoulder   Type of Shoulder Precautions Dr. Durward Fortes Protocol:  PROM for 6 weeks ( through 8/17)                  OT Treatments/Exercises (OP) - 06/08/16 1310      Exercises   Exercises Shoulder;Elbow     Shoulder Exercises: Supine   Protraction PROM;10 reps   Horizontal ABduction PROM;10 reps   External Rotation PROM;10 reps   Internal Rotation PROM;10 reps   Flexion PROM;10 reps   ABduction PROM;10 reps     Shoulder Exercises: Seated   Elevation AROM;12 reps   Extension AROM;12 reps   Row AROM;12 reps     Shoulder Exercises: Therapy Ball   Flexion --  12 reps   ABduction --  12 reps     Shoulder Exercises: ROM/Strengthening   Thumb Tacks 1'  Anterior Glide 3x10"   Caudal Glide 3x10"   Prot/Ret//Elev/Dep 1'     Manual Therapy   Manual Therapy Myofascial release   Manual therapy comments manual therapy completed seperately from all other interventions this date   Myofascial Release myofascial release and manual stretching to right upper arm, scapular, shoulder region and associated areas to decrease pain and restrictions and imrpove pain free mobility in right shoulder.     Muscle Energy Technique Muscle energy  technique completed to right anterior deltoid to relax tone and muscle spasm and improve range of motion.                    OT Short Term Goals - 05/18/16 1415      OT SHORT TERM GOAL #1   Title Patient will be educated on a HEP for right shoulder ROM and strength.   Time 6   Period Weeks   Status On-going      OT SHORT TERM GOAL #2   Title Patient will improve right shoulder and elbow P/ROM to [facility] in order to complete B/ADLs with increased independence.   Time 6   Period Weeks   Status On-going     OT SHORT TERM GOAL #3   Title Patient will improve right shoulder and elbow strength to 3+/5 for increased ability to pick up baskets of laundry.   Time 6   Period Weeks   Status On-going     OT SHORT TERM GOAL #4   Title Patient will decrease right shoulder pain to 4/10 or better in order to complete B/ADLs with greater independence.   Time 6   Period Weeks   Status On-going     OT SHORT TERM GOAL #5   Title Patient will decrease fascial restrictions to moderate in right shoulder region for greater mobility needed for greater use of right arm as dominant.    Time 6   Period Weeks   Status On-going           OT Long Term Goals - 05/18/16 1415      OT LONG TERM GOAL #1   Title Patient will return to prior level of independence with all B/IADLs, work, and leisure activities using her right arm as dominant.    Time 12   Period Weeks   Status On-going     OT LONG TERM GOAL #2   Title Patient will improve right shoulder and elbow A/ROM to WNL for greater ability to use right arm as dominant at work and home.    Time 12   Period Weeks   Status On-going     OT LONG TERM GOAL #3   Title Patient will improve right shoulder and elbow strength to 5/5 for greater ability to lift bags of mulch when completing yardwork.     Time 12   Period Weeks   Status On-going     OT LONG TERM GOAL #4   Title Patient will decrease right shoulder pain to 2/10 or better when completing functional actiivites.    Time 12   Period Weeks   Status On-going     OT LONG TERM GOAL #5   Title Paitent will decrease right fascial restrictions in right shoulder in order to have greater mobility needed for use of right arm with functional activiites.     Time 12   Period Weeks   Status On-going  Plan - 06/08/16 1428    Clinical Impression Statement A: Continued with P/ROM, scapular A/ROM, per protocol. Pt completed low thumb tacks at waist height today. Pt required verbal cuing for form and technique during exercises, reports HEP is going well and she only needs pain medication prior to therapy sessions.    Rehab Potential Good   OT Frequency 2x / week   OT Duration 12 weeks   OT Treatment/Interventions Self-care/ADL training;Electrical Stimulation;Moist Heat;Cryotherapy;Ultrasound;Therapeutic exercise;Neuromuscular education;DME and/or AE instruction;Energy conservation;Manual Therapy;Passive range of motion;Scar mobilization;Therapeutic exercises;Therapeutic activities;Patient/family education   Plan P: Continue working on increasing P/ROM to [facility] within protocol, increase therapy ball repetitions to 15.       Patient will benefit from skilled therapeutic intervention in order to improve the following deficits and impairments:  Decreased range of motion, Decreased skin integrity, Decreased scar mobility, Decreased strength, Increased muscle spasms, Increased fascial restricitons, Impaired UE functional use, Pain  Visit Diagnosis: Stiffness of right shoulder, not elsewhere classified  Pain in right shoulder  Other symptoms and signs involving the musculoskeletal system    Problem List Patient Active Problem List   Diagnosis Date Noted  . Epigastric pain 08/11/2015  . Chest pain, rule out acute myocardial infarction 08/11/2015  . Renal insufficiency 08/11/2015  . Venous stasis 04/13/2015  . Depression 04/13/2015  . Essential hypertension 09/12/2014  . PVCs (premature ventricular contractions) 09/07/2014  . Precordial pain 09/07/2014     Guadelupe Sabin, OTR/L  [phone] 06/08/2016, 2:31 PM  [place] [address] [place], [place], [address] Phone: [phone]   Fax:  [phone]  Name: TALASIA SAULTER MRN: [id number] Date of Birth: 02-13-1951

## 2016-06-11 ENCOUNTER — Encounter (HOSPITAL_COMMUNITY): Payer: Self-pay | Admitting: Internal Medicine

## 2016-06-13 ENCOUNTER — Ambulatory Visit (HOSPITAL_COMMUNITY): Payer: 59

## 2016-06-13 ENCOUNTER — Encounter (HOSPITAL_COMMUNITY): Payer: Self-pay

## 2016-06-13 DIAGNOSIS — M25611 Stiffness of right shoulder, not elsewhere classified: Secondary | ICD-10-CM

## 2016-06-13 DIAGNOSIS — M25511 Pain in right shoulder: Secondary | ICD-10-CM

## 2016-06-13 DIAGNOSIS — R29898 Other symptoms and signs involving the musculoskeletal system: Secondary | ICD-10-CM

## 2016-06-13 DIAGNOSIS — M25621 Stiffness of right elbow, not elsewhere classified: Secondary | ICD-10-CM | POA: Diagnosis not present

## 2016-06-13 NOTE — Therapy (Signed)
[place], [place], [address] Phone: [phone]   Fax:  [phone]  Occupational Therapy Treatment  Patient Details  Name: Jennifer Coleman MRN: [id number] Date of Birth: April 02, 1951 Referring Provider: Dr. Joni Fears  Encounter Date: 06/13/2016      OT End of Session - 06/13/16 0942    Visit Number 9   Number of Visits 24   Date for OT Re-Evaluation 07/13/16   Authorization Type UMR   OT Start Time 0900  measurements taken for MD appointment   OT Stop Time 0947   OT Time Calculation (min) 47 min   Activity Tolerance Patient tolerated treatment well   Behavior During Therapy [facility] for tasks assessed/performed      Past Medical History:  Diagnosis Date  . Depression   . Family history of anesthesia complication 25 yrs ago   Sister allergic to sccinocholine - unable to come off ventilator  . Hypertension   . PONV (postoperative nausea and vomiting)   . Shingles   . Varicose veins     Past Surgical History:  Procedure Laterality Date  . CARPAL TUNNEL RELEASE    . CERVICAL DISC SURGERY  1992   Fusion  . COLONOSCOPY N/A 06/07/2016   Procedure: COLONOSCOPY;  Surgeon: Rogene Houston, MD;  [facility];  Service: Endoscopy;  Laterality: N/A;  1:00  . KNEE ARTHROSCOPY  09/16/2012   Procedure: ARTHROSCOPY KNEE;  Surgeon: [name], MD;  Location: [place];  Service: Orthopedics;  Laterality: Right;  Right Knee Arthroscopy  . SALPINGOOPHORECTOMY Left 1985  . SHOULDER ARTHROSCOPY WITH OPEN ROTATOR CUFF REPAIR AND DISTAL CLAVICLE ACROMINECTOMY Right 05/03/2016   Procedure: RIGHT SHOULDER ARTHROSCOPY WITH MINI-OPEN ROTATOR CUFF REPAIR,DISTAL CLAVICLE RESECTION, SUBACROMIAL DECOMPRESSION.;  Surgeon: [name], MD;  Location: [place];  Service: Orthopedics;  Laterality: Right;    There were no vitals filed for this visit.      Subjective Assessment - 06/13/16 0938    Subjective  S: I see the Dr. today.   Currently in Pain? Yes   Pain Score 5    Pain Location Shoulder   Pain Orientation Right   Pain Descriptors / Indicators Aching;Sore   Pain Type Acute pain            [facility] OT Assessment - 06/13/16 0905      Assessment   Diagnosis S/P Right SAD, DCE, RCR and biceps tenodesis     Precautions   Precautions Shoulder   Type of Shoulder Precautions Begin AA/ROM for 4 weeks (until 07/11/16)      ROM / Strength   AROM / PROM / Strength AROM;PROM;Strength     Palpation   Palpation comment Min-mod fascial restrictions in right upper arm, trapezius, and scapularis region.      AROM   Overall AROM  Unable to assess;Due to precautions     PROM   Overall PROM Comments assessed in supine, external and internal rotation with shoulder adducted   PROM Assessment Site Shoulder   Right/Left Shoulder Right   Right Shoulder Flexion 105 Degrees  previous: 63   Right Shoulder ABduction 100 Degrees  previous: 50   Right Shoulder Internal Rotation 90 Degrees  previous: 85   Right Shoulder External Rotation 35 Degrees  previous: 0     Strength   Overall Strength Unable to assess;Due to precautions  OT Treatments/Exercises (OP) - 06/13/16 0929      Exercises   Exercises Shoulder;Elbow     Shoulder Exercises: Supine   Protraction PROM;10 reps;AAROM;5 reps   Protraction Limitations Therapist assisted with off loading arm.   Horizontal ABduction PROM;10 reps   External Rotation PROM;AAROM;10 reps   Internal Rotation PROM;AAROM;10 reps   Flexion PROM;10 reps   ABduction PROM;10 reps     Shoulder Exercises: Therapy Ball   Flexion 15 reps   ABduction 15 reps     Manual Therapy   Manual Therapy Myofascial release;Muscle Energy Technique   Manual therapy comments manual therapy completed seperately from all other interventions this date   Myofascial Release myofascial release and manual stretching to right upper arm,  scapular, shoulder region and associated areas to decrease pain and restrictions and imrpove pain free mobility in right shoulder.     Muscle Energy Technique Muscle energy  technique completed to right anterior deltoid to relax tone and muscle spasm and improve range of motion.                    OT Short Term Goals - 05/18/16 1415      OT SHORT TERM GOAL #1   Title Patient will be educated on a HEP for right shoulder ROM and strength.   Time 6   Period Weeks   Status On-going     OT SHORT TERM GOAL #2   Title Patient will improve right shoulder and elbow P/ROM to [facility] in order to complete B/ADLs with increased independence.   Time 6   Period Weeks   Status On-going     OT SHORT TERM GOAL #3   Title Patient will improve right shoulder and elbow strength to 3+/5 for increased ability to pick up baskets of laundry.   Time 6   Period Weeks   Status On-going     OT SHORT TERM GOAL #4   Title Patient will decrease right shoulder pain to 4/10 or better in order to complete B/ADLs with greater independence.   Time 6   Period Weeks   Status On-going     OT SHORT TERM GOAL #5   Title Patient will decrease fascial restrictions to moderate in right shoulder region for greater mobility needed for greater use of right arm as dominant.    Time 6   Period Weeks   Status On-going           OT Long Term Goals - 05/18/16 1415      OT LONG TERM GOAL #1   Title Patient will return to prior level of independence with all B/IADLs, work, and leisure activities using her right arm as dominant.    Time 12   Period Weeks   Status On-going     OT LONG TERM GOAL #2   Title Patient will improve right shoulder and elbow A/ROM to WNL for greater ability to use right arm as dominant at work and home.    Time 12   Period Weeks   Status On-going     OT LONG TERM GOAL #3   Title Patient will improve right shoulder and elbow strength to 5/5 for greater ability to lift bags of mulch  when completing yardwork.     Time 12   Period Weeks   Status On-going     OT LONG TERM GOAL #4   Title Patient will decrease right shoulder pain to 2/10 or better when completing functional actiivites.  Time 12   Period Weeks   Status On-going     OT LONG TERM GOAL #5   Title Paitent will decrease right fascial restrictions in right shoulder in order to have greater mobility needed for use of right arm with functional activiites.     Time 12   Period Weeks   Status On-going               Plan - 06/13/16 [id number]    Clinical Impression Statement A: Measurements taken for MD appointment this session. patient has made progress with P/ROM shoulder movements. Pt completed AA/ROM supine for IR/er and protraction. Mod-max assist from therapist needed to complete protraction although with every rep she was able to complete more herself.    Plan P: Continue to progress more AA/ROM as able to tolerate supine. Follow up on MD appointment.       Patient will benefit from skilled therapeutic intervention in order to improve the following deficits and impairments:  Decreased range of motion, Decreased skin integrity, Decreased scar mobility, Decreased strength, Increased muscle spasms, Increased fascial restricitons, Impaired UE functional use, Pain  Visit Diagnosis: Stiffness of right shoulder, not elsewhere classified  Pain in right shoulder  Other symptoms and signs involving the musculoskeletal system    Problem List Patient Active Problem List   Diagnosis Date Noted  . Epigastric pain 08/11/2015  . Chest pain, rule out acute myocardial infarction 08/11/2015  . Renal insufficiency 08/11/2015  . Venous stasis 04/13/2015  . Depression 04/13/2015  . Essential hypertension 09/12/2014  . PVCs (premature ventricular contractions) 09/07/2014  . Precordial pain 09/07/2014     Ailene Ravel, OTR/L,CBIS  [phone]  06/13/2016, 10:56 AM  [place] [address] [place], [place], [address] Phone: [phone]   Fax:  [phone]  Name: Jennifer Coleman MRN: [id number] Date of Birth: 1950/11/17

## 2016-06-15 ENCOUNTER — Encounter (HOSPITAL_COMMUNITY): Payer: Self-pay | Admitting: Occupational Therapy

## 2016-06-15 ENCOUNTER — Ambulatory Visit (HOSPITAL_COMMUNITY): Payer: 59 | Admitting: Occupational Therapy

## 2016-06-15 DIAGNOSIS — M25621 Stiffness of right elbow, not elsewhere classified: Secondary | ICD-10-CM | POA: Diagnosis not present

## 2016-06-15 DIAGNOSIS — M25511 Pain in right shoulder: Secondary | ICD-10-CM | POA: Diagnosis not present

## 2016-06-15 DIAGNOSIS — M25611 Stiffness of right shoulder, not elsewhere classified: Secondary | ICD-10-CM | POA: Diagnosis not present

## 2016-06-15 DIAGNOSIS — R29898 Other symptoms and signs involving the musculoskeletal system: Secondary | ICD-10-CM | POA: Diagnosis not present

## 2016-06-15 NOTE — Therapy (Signed)
[place], [place], [address] Phone: [phone]   Fax:  [phone]  Occupational Therapy Treatment  Patient Details  Name: Jennifer Coleman MRN: [id number] Date of Birth: 1951-08-04 Referring Provider: Dr. Joni Fears  Encounter Date: 06/15/2016      OT End of Session - 06/15/16 0938    Visit Number 10   Number of Visits 24   Date for OT Re-Evaluation 07/13/16   Authorization Type UMR   OT Start Time 0818   OT Stop Time 0858   OT Time Calculation (min) 40 min   Activity Tolerance Patient tolerated treatment well   Behavior During Therapy [facility] for tasks assessed/performed      Past Medical History:  Diagnosis Date  . Depression   . Family history of anesthesia complication 25 yrs ago   Sister allergic to sccinocholine - unable to come off ventilator  . Hypertension   . PONV (postoperative nausea and vomiting)   . Shingles   . Varicose veins     Past Surgical History:  Procedure Laterality Date  . CARPAL TUNNEL RELEASE    . CERVICAL DISC SURGERY  1992   Fusion  . COLONOSCOPY N/A 06/07/2016   Procedure: COLONOSCOPY;  Surgeon: Rogene Houston, MD;  [facility];  Service: Endoscopy;  Laterality: N/A;  1:00  . KNEE ARTHROSCOPY  09/16/2012   Procedure: ARTHROSCOPY KNEE;  Surgeon: [name], MD;  Location: [place];  Service: Orthopedics;  Laterality: Right;  Right Knee Arthroscopy  . SALPINGOOPHORECTOMY Left 1985  . SHOULDER ARTHROSCOPY WITH OPEN ROTATOR CUFF REPAIR AND DISTAL CLAVICLE ACROMINECTOMY Right 05/03/2016   Procedure: RIGHT SHOULDER ARTHROSCOPY WITH MINI-OPEN ROTATOR CUFF REPAIR,DISTAL CLAVICLE RESECTION, SUBACROMIAL DECOMPRESSION.;  Surgeon: [name], MD;  Location: [place];  Service: Orthopedics;  Laterality: Right;    There were no vitals filed for this visit.      Subjective Assessment - 06/15/16 0822    Currently in Pain? Yes   Pain Score 7     Pain Location Shoulder   Pain Orientation Right   Pain Descriptors / Indicators Aching;Sore   Pain Type Acute pain   Pain Radiating Towards neck to bicep   Pain Onset More than a month ago   Pain Frequency Constant   Aggravating Factors  movement, use   Pain Relieving Factors rest, medications, ice   Effect of Pain on Daily Activities limited use of RUE during ADL tasks            [facility] OT Assessment - 06/15/16 [id number]      Assessment   Diagnosis S/P Right SAD, DCE, RCR and biceps tenodesis     Precautions   Precautions Shoulder   Type of Shoulder Precautions Begin AA/ROM for 4 weeks (until 07/11/16)                   OT Treatments/Exercises (OP) - 06/15/16 0823      Exercises   Exercises Shoulder;Elbow     Shoulder Exercises: Supine   Protraction PROM;10 reps;AAROM;5 reps   Protraction Limitations Therapist assisted with off loading arm.   Horizontal ABduction PROM;10 reps;AAROM;5 reps   Horizontal ABduction Limitations OT assist for off loading arm   External Rotation PROM;AAROM;10 reps   Internal Rotation PROM;AAROM;10 reps   Flexion PROM;10 reps;AAROM;5 reps   Flexion Limitations OT assist for off loading arm   ABduction PROM;10 reps     Shoulder Exercises: Therapy Diona Foley  Flexion 15 reps   ABduction 15 reps     Shoulder Exercises: ROM/Strengthening   Thumb Tacks 1'     Manual Therapy   Manual Therapy Myofascial release;Muscle Energy Technique   Manual therapy comments manual therapy completed seperately from all other interventions this date   Myofascial Release myofascial release and manual stretching to right upper arm, scapular, shoulder region and associated areas to decrease pain and restrictions and imrpove pain free mobility in right shoulder.     Muscle Energy Technique Muscle energy  technique completed to right anterior deltoid to relax tone and muscle spasm and improve range of motion.                    OT Short Term Goals -  05/18/16 1415      OT SHORT TERM GOAL #1   Title Patient will be educated on a HEP for right shoulder ROM and strength.   Time 6   Period Weeks   Status On-going     OT SHORT TERM GOAL #2   Title Patient will improve right shoulder and elbow P/ROM to [facility] in order to complete B/ADLs with increased independence.   Time 6   Period Weeks   Status On-going     OT SHORT TERM GOAL #3   Title Patient will improve right shoulder and elbow strength to 3+/5 for increased ability to pick up baskets of laundry.   Time 6   Period Weeks   Status On-going     OT SHORT TERM GOAL #4   Title Patient will decrease right shoulder pain to 4/10 or better in order to complete B/ADLs with greater independence.   Time 6   Period Weeks   Status On-going     OT SHORT TERM GOAL #5   Title Patient will decrease fascial restrictions to moderate in right shoulder region for greater mobility needed for greater use of right arm as dominant.    Time 6   Period Weeks   Status On-going           OT Long Term Goals - 05/18/16 1415      OT LONG TERM GOAL #1   Title Patient will return to prior level of independence with all B/IADLs, work, and leisure activities using her right arm as dominant.    Time 12   Period Weeks   Status On-going     OT LONG TERM GOAL #2   Title Patient will improve right shoulder and elbow A/ROM to WNL for greater ability to use right arm as dominant at work and home.    Time 12   Period Weeks   Status On-going     OT LONG TERM GOAL #3   Title Patient will improve right shoulder and elbow strength to 5/5 for greater ability to lift bags of mulch when completing yardwork.     Time 12   Period Weeks   Status On-going     OT LONG TERM GOAL #4   Title Patient will decrease right shoulder pain to 2/10 or better when completing functional actiivites.    Time 12   Period Weeks   Status On-going     OT LONG TERM GOAL #5   Title Paitent will decrease right fascial  restrictions in right shoulder in order to have greater mobility needed for use of right arm with functional activiites.     Time 12   Period Weeks   Status On-going  Plan - 06/15/16 [id number]    Clinical Impression Statement A: Added flexion and horizontal abduction to AA/ROM supine exercises this session. Pt continues to require assistance from OT to off load weight during exercises, less assistance needed as pt became familiar with exercises. Verbal cuing for form and technique. Pt reports MD wants her to complete exercises and stretches for at least one hour each day.     Rehab Potential Good   OT Frequency 2x / week   OT Duration 12 weeks   OT Treatment/Interventions Self-care/ADL training;Electrical Stimulation;Moist Heat;Cryotherapy;Ultrasound;Therapeutic exercise;Neuromuscular education;DME and/or AE instruction;Energy conservation;Manual Therapy;Passive range of motion;Scar mobilization;Therapeutic exercises;Therapeutic activities;Patient/family education   Plan P: Continue to progress AA/ROM as able to tolerate, focusing on independence during exercises.       Patient will benefit from skilled therapeutic intervention in order to improve the following deficits and impairments:  Decreased range of motion, Decreased skin integrity, Decreased scar mobility, Decreased strength, Increased muscle spasms, Increased fascial restricitons, Impaired UE functional use, Pain  Visit Diagnosis: Stiffness of right shoulder, not elsewhere classified  Pain in right shoulder  Other symptoms and signs involving the musculoskeletal system    Problem List Patient Active Problem List   Diagnosis Date Noted  . Epigastric pain 08/11/2015  . Chest pain, rule out acute myocardial infarction 08/11/2015  . Renal insufficiency 08/11/2015  . Venous stasis 04/13/2015  . Depression 04/13/2015  . Essential hypertension 09/12/2014  . PVCs (premature ventricular contractions) 09/07/2014  .  Precordial pain 09/07/2014   Guadelupe Sabin, OTR/L  [phone] 06/15/2016, 9:41 AM  [place] [address] [place], [place], [address] Phone: [phone]   Fax:  [phone]  Name: Jennifer Coleman MRN: [id number] Date of Birth: Jun 19, 1951

## 2016-06-18 ENCOUNTER — Ambulatory Visit (HOSPITAL_COMMUNITY): Payer: 59

## 2016-06-18 ENCOUNTER — Encounter (HOSPITAL_COMMUNITY): Payer: Self-pay

## 2016-06-18 DIAGNOSIS — M25511 Pain in right shoulder: Secondary | ICD-10-CM | POA: Diagnosis not present

## 2016-06-18 DIAGNOSIS — M25621 Stiffness of right elbow, not elsewhere classified: Secondary | ICD-10-CM | POA: Diagnosis not present

## 2016-06-18 DIAGNOSIS — M25611 Stiffness of right shoulder, not elsewhere classified: Secondary | ICD-10-CM | POA: Diagnosis not present

## 2016-06-18 DIAGNOSIS — R29898 Other symptoms and signs involving the musculoskeletal system: Secondary | ICD-10-CM | POA: Diagnosis not present

## 2016-06-18 NOTE — Therapy (Signed)
[place], [place], [address] Phone: [phone]   Fax:  [phone]  Occupational Therapy Treatment  Patient Details  Name: Jennifer Coleman MRN: [id number] Date of Birth: Aug 03, 1951 Referring Provider: Dr. Joni Fears  Encounter Date: 06/18/2016      OT End of Session - 06/18/16 1630    Visit Number 11   Number of Visits 24   Date for OT Re-Evaluation 07/13/16   Authorization Type UMR   OT Start Time 1435   OT Stop Time 1515   OT Time Calculation (min) 40 min   Activity Tolerance Patient tolerated treatment well   Behavior During Therapy [facility] for tasks assessed/performed      Past Medical History:  Diagnosis Date  . Depression   . Family history of anesthesia complication 25 yrs ago   Sister allergic to sccinocholine - unable to come off ventilator  . Hypertension   . PONV (postoperative nausea and vomiting)   . Shingles   . Varicose veins     Past Surgical History:  Procedure Laterality Date  . CARPAL TUNNEL RELEASE    . CERVICAL DISC SURGERY  1992   Fusion  . COLONOSCOPY N/A 06/07/2016   Procedure: COLONOSCOPY;  Surgeon: Rogene Houston, MD;  [facility];  Service: Endoscopy;  Laterality: N/A;  1:00  . KNEE ARTHROSCOPY  09/16/2012   Procedure: ARTHROSCOPY KNEE;  Surgeon: [name], MD;  Location: [place];  Service: Orthopedics;  Laterality: Right;  Right Knee Arthroscopy  . SALPINGOOPHORECTOMY Left 1985  . SHOULDER ARTHROSCOPY WITH OPEN ROTATOR CUFF REPAIR AND DISTAL CLAVICLE ACROMINECTOMY Right 05/03/2016   Procedure: RIGHT SHOULDER ARTHROSCOPY WITH MINI-OPEN ROTATOR CUFF REPAIR,DISTAL CLAVICLE RESECTION, SUBACROMIAL DECOMPRESSION.;  Surgeon: [name], MD;  Location: [place];  Service: Orthopedics;  Laterality: Right;    There were no vitals filed for this visit.      Subjective Assessment - 06/18/16 1625    Subjective  S: maybe I'm doing too much  because I'm hurting really bad.    Currently in Pain? Yes   Pain Score 7    Pain Location Shoulder   Pain Orientation Right   Pain Descriptors / Indicators Aching;Sore   Pain Type Acute pain            [facility] OT Assessment - 06/18/16 1502      Assessment   Diagnosis S/P Right SAD, DCE, RCR and biceps tenodesis     Precautions   Precautions Shoulder   Type of Shoulder Precautions Begin AA/ROM for 4 weeks (until 07/11/16)                   OT Treatments/Exercises (OP) - 06/18/16 1502      Exercises   Exercises Shoulder;Elbow     Shoulder Exercises: Supine   Protraction PROM;10 reps;AAROM;5 reps   Protraction Limitations Pt completed independently this date!   Horizontal ABduction PROM;10 reps;AAROM;5 reps   Horizontal ABduction Limitations Pt completed independently but with increased pain   External Rotation PROM;AAROM;10 reps   Internal Rotation PROM;AAROM;10 reps   Flexion PROM;10 reps;AAROM;5 reps   Flexion Limitations Only able to complete 25-30% range independently   ABduction PROM;10 reps     Shoulder Exercises: Pulleys   Flexion 1 minute   ABduction 1 minute     Manual Therapy   Manual Therapy Myofascial release;Muscle Energy Technique   Manual therapy comments manual therapy completed seperately from all other  interventions this date   Myofascial Release myofascial release and manual stretching to right upper arm, scapular, shoulder region and associated areas to decrease pain and restrictions and imrpove pain free mobility in right shoulder.     Muscle Energy Technique Muscle energy  technique completed to right anterior deltoid to relax tone and muscle spasm and improve range of motion.                  OT Education - 06/18/16 1626    Education provided Yes   Education Details Encouraged patient not to complete AA/ROM exercises at home yet. Pt reports that husband has been helping her. With her pain level so high I recommended that she  only complete the table slides.   Person(s) Educated Patient   Methods Explanation   Comprehension Verbalized understanding          OT Short Term Goals - 05/18/16 1415      OT SHORT TERM GOAL #1   Title Patient will be educated on a HEP for right shoulder ROM and strength.   Time 6   Period Weeks   Status On-going     OT SHORT TERM GOAL #2   Title Patient will improve right shoulder and elbow P/ROM to [facility] in order to complete B/ADLs with increased independence.   Time 6   Period Weeks   Status On-going     OT SHORT TERM GOAL #3   Title Patient will improve right shoulder and elbow strength to 3+/5 for increased ability to pick up baskets of laundry.   Time 6   Period Weeks   Status On-going     OT SHORT TERM GOAL #4   Title Patient will decrease right shoulder pain to 4/10 or better in order to complete B/ADLs with greater independence.   Time 6   Period Weeks   Status On-going     OT SHORT TERM GOAL #5   Title Patient will decrease fascial restrictions to moderate in right shoulder region for greater mobility needed for greater use of right arm as dominant.    Time 6   Period Weeks   Status On-going           OT Long Term Goals - 05/18/16 1415      OT LONG TERM GOAL #1   Title Patient will return to prior level of independence with all B/IADLs, work, and leisure activities using her right arm as dominant.    Time 12   Period Weeks   Status On-going     OT LONG TERM GOAL #2   Title Patient will improve right shoulder and elbow A/ROM to WNL for greater ability to use right arm as dominant at work and home.    Time 12   Period Weeks   Status On-going     OT LONG TERM GOAL #3   Title Patient will improve right shoulder and elbow strength to 5/5 for greater ability to lift bags of mulch when completing yardwork.     Time 12   Period Weeks   Status On-going     OT LONG TERM GOAL #4   Title Patient will decrease right shoulder pain to 2/10 or better when  completing functional actiivites.    Time 12   Period Weeks   Status On-going     OT LONG TERM GOAL #5   Title Paitent will decrease right fascial restrictions in right shoulder in order to have greater mobility needed for use  of right arm with functional activiites.     Time 12   Period Weeks   Status On-going               Plan - 06/18/16 1630    Clinical Impression Statement A: Pt sees the MD on 07/11/16. Pt was able to complete AA/ROM exercises without assist from therapist this date with increased difficulty and time. Max encourgement required although, patient did great.    Plan P: Continue to work on progressing AA/ROM as able to tolerate. Attempt seated if able.       Patient will benefit from skilled therapeutic intervention in order to improve the following deficits and impairments:  Decreased range of motion, Decreased skin integrity, Decreased scar mobility, Decreased strength, Increased muscle spasms, Increased fascial restricitons, Impaired UE functional use, Pain  Visit Diagnosis: Stiffness of right shoulder, not elsewhere classified  Other symptoms and signs involving the musculoskeletal system  Pain in right shoulder    Problem List Patient Active Problem List   Diagnosis Date Noted  . Epigastric pain 08/11/2015  . Chest pain, rule out acute myocardial infarction 08/11/2015  . Renal insufficiency 08/11/2015  . Venous stasis 04/13/2015  . Depression 04/13/2015  . Essential hypertension 09/12/2014  . PVCs (premature ventricular contractions) 09/07/2014  . Precordial pain 09/07/2014    Ailene Ravel, OTR/L,CBIS  [phone]  06/18/2016, 4:33 PM  [place] [address] [place], [place], [address] Phone: [phone]   Fax:  [phone]  Name: AZJA MAXIM MRN: [id number] Date of Birth: [birth year]

## 2016-06-19 MED FILL — ESTRADIOL-NORETH 0.5-0.1 MG: 0.5-0.1 | 28 days supply | Qty: 28 | Fill #0

## 2016-06-20 ENCOUNTER — Ambulatory Visit (HOSPITAL_COMMUNITY): Payer: 59

## 2016-06-20 DIAGNOSIS — M25621 Stiffness of right elbow, not elsewhere classified: Secondary | ICD-10-CM | POA: Diagnosis not present

## 2016-06-20 DIAGNOSIS — M25511 Pain in right shoulder: Secondary | ICD-10-CM

## 2016-06-20 DIAGNOSIS — M25611 Stiffness of right shoulder, not elsewhere classified: Secondary | ICD-10-CM

## 2016-06-20 DIAGNOSIS — R29898 Other symptoms and signs involving the musculoskeletal system: Secondary | ICD-10-CM

## 2016-06-20 NOTE — Therapy (Signed)
[place], [place], [address] Phone: [phone]   Fax:  [phone]  Occupational Therapy Treatment  Patient Details  Name: Jennifer Coleman MRN: [id number] Date of Birth: December 29, 1950 Referring Provider: Dr. Joni Fears  Encounter Date: 06/20/2016      OT End of Session - 06/20/16 1436    Visit Number 12   Number of Visits 24   Date for OT Re-Evaluation 07/13/16   Authorization Type UMR   OT Start Time 1350   OT Stop Time 1430   OT Time Calculation (min) 40 min   Activity Tolerance Patient tolerated treatment well   Behavior During Therapy [facility] for tasks assessed/performed      Past Medical History:  Diagnosis Date  . Depression   . Family history of anesthesia complication 25 yrs ago   Sister allergic to sccinocholine - unable to come off ventilator  . Hypertension   . PONV (postoperative nausea and vomiting)   . Shingles   . Varicose veins     Past Surgical History:  Procedure Laterality Date  . CARPAL TUNNEL RELEASE    . CERVICAL DISC SURGERY  1992   Fusion  . COLONOSCOPY N/A 06/07/2016   Procedure: COLONOSCOPY;  Surgeon: Rogene Houston, MD;  [facility];  Service: Endoscopy;  Laterality: N/A;  1:00  . KNEE ARTHROSCOPY  09/16/2012   Procedure: ARTHROSCOPY KNEE;  Surgeon: [name], MD;  Location: [place];  Service: Orthopedics;  Laterality: Right;  Right Knee Arthroscopy  . SALPINGOOPHORECTOMY Left 1985  . SHOULDER ARTHROSCOPY WITH OPEN ROTATOR CUFF REPAIR AND DISTAL CLAVICLE ACROMINECTOMY Right 05/03/2016   Procedure: RIGHT SHOULDER ARTHROSCOPY WITH MINI-OPEN ROTATOR CUFF REPAIR,DISTAL CLAVICLE RESECTION, SUBACROMIAL DECOMPRESSION.;  Surgeon: [name], MD;  Location: [place];  Service: Orthopedics;  Laterality: Right;    There were no vitals filed for this visit.      Subjective Assessment - 06/20/16 1409    Subjective  S: I fell asleep with the ice  pack on me. I've been icing a lot and I took a pain pill before I came here today.   Currently in Pain? Yes   Pain Score 5    Pain Location Shoulder   Pain Orientation Right   Pain Descriptors / Indicators Aching;Sore   Pain Type Acute pain            [facility] OT Assessment - 06/20/16 1411      Assessment   Diagnosis S/P Right SAD, DCE, RCR and biceps tenodesis     Precautions   Precautions Shoulder   Type of Shoulder Precautions Begin AA/ROM for 4 weeks (until 07/11/16)                   OT Treatments/Exercises (OP) - 06/20/16 1411      Exercises   Exercises Shoulder;Elbow     Shoulder Exercises: Supine   Protraction PROM;10 reps;AAROM;5 reps   Horizontal ABduction PROM;10 reps;AAROM;5 reps   External Rotation PROM;AAROM;10 reps   Internal Rotation PROM;AAROM;10 reps   ABduction PROM;10 reps     Shoulder Exercises: Standing   Other Standing Exercises PVC pipe slide; 6X; Active assist from therapist for last two.     Shoulder Exercises: Pulleys   Flexion 1 minute   ABduction 1 minute     Shoulder Exercises: ROM/Strengthening   Thumb Tacks 1'     Manual Therapy   Manual Therapy Myofascial release   Manual  therapy comments manual therapy completed seperately from all other interventions this date   Myofascial Release myofascial release and manual stretching to right upper arm, scapular, shoulder region and associated areas to decrease pain and restrictions and imrpove pain free mobility in right shoulder.                    OT Short Term Goals - 05/18/16 1415      OT SHORT TERM GOAL #1   Title Patient will be educated on a HEP for right shoulder ROM and strength.   Time 6   Period Weeks   Status On-going     OT SHORT TERM GOAL #2   Title Patient will improve right shoulder and elbow P/ROM to [facility] in order to complete B/ADLs with increased independence.   Time 6   Period Weeks   Status On-going     OT SHORT TERM GOAL #3   Title Patient  will improve right shoulder and elbow strength to 3+/5 for increased ability to pick up baskets of laundry.   Time 6   Period Weeks   Status On-going     OT SHORT TERM GOAL #4   Title Patient will decrease right shoulder pain to 4/10 or better in order to complete B/ADLs with greater independence.   Time 6   Period Weeks   Status On-going     OT SHORT TERM GOAL #5   Title Patient will decrease fascial restrictions to moderate in right shoulder region for greater mobility needed for greater use of right arm as dominant.    Time 6   Period Weeks   Status On-going           OT Long Term Goals - 05/18/16 1415      OT LONG TERM GOAL #1   Title Patient will return to prior level of independence with all B/IADLs, work, and leisure activities using her right arm as dominant.    Time 12   Period Weeks   Status On-going     OT LONG TERM GOAL #2   Title Patient will improve right shoulder and elbow A/ROM to WNL for greater ability to use right arm as dominant at work and home.    Time 12   Period Weeks   Status On-going     OT LONG TERM GOAL #3   Title Patient will improve right shoulder and elbow strength to 5/5 for greater ability to lift bags of mulch when completing yardwork.     Time 12   Period Weeks   Status On-going     OT LONG TERM GOAL #4   Title Patient will decrease right shoulder pain to 2/10 or better when completing functional actiivites.    Time 12   Period Weeks   Status On-going     OT LONG TERM GOAL #5   Title Paitent will decrease right fascial restrictions in right shoulder in order to have greater mobility needed for use of right arm with functional activiites.     Time 12   Period Weeks   Status On-going               Plan - 06/20/16 1436    Clinical Impression Statement A: Added PVC pipe slide this session to increase functional mobility during AA/ROM exercises. patient did require some assistance with last two repetitions. Patient has to  actively stop shoulder elevation when trying to cmplete flexion with the pvc pipe. VC for form and  technique.    Plan P: Continue to work on progressing ability to complete AA/ROM exercises and increase repetitions as able. Attempt seated when able.       Patient will benefit from skilled therapeutic intervention in order to improve the following deficits and impairments:  Decreased range of motion, Decreased skin integrity, Decreased scar mobility, Decreased strength, Increased muscle spasms, Increased fascial restricitons, Impaired UE functional use, Pain  Visit Diagnosis: Stiffness of right shoulder, not elsewhere classified  Other symptoms and signs involving the musculoskeletal system  Pain in right shoulder    Problem List Patient Active Problem List   Diagnosis Date Noted  . Epigastric pain 08/11/2015  . Chest pain, rule out acute myocardial infarction 08/11/2015  . Renal insufficiency 08/11/2015  . Venous stasis 04/13/2015  . Depression 04/13/2015  . Essential hypertension 09/12/2014  . PVCs (premature ventricular contractions) 09/07/2014  . Precordial pain 09/07/2014   Ailene Ravel, OTR/L,CBIS  [phone]  06/20/2016, 2:39 PM  [place] [address] [place], [place], [address] Phone: [phone]   Fax:  [phone]  Name: Jennifer Coleman MRN: [id number] Date of Birth: [birth year]

## 2016-06-22 ENCOUNTER — Encounter (HOSPITAL_COMMUNITY): Payer: Self-pay

## 2016-06-22 ENCOUNTER — Ambulatory Visit (HOSPITAL_COMMUNITY): Payer: 59

## 2016-06-22 DIAGNOSIS — M25611 Stiffness of right shoulder, not elsewhere classified: Secondary | ICD-10-CM

## 2016-06-22 DIAGNOSIS — M25511 Pain in right shoulder: Secondary | ICD-10-CM | POA: Diagnosis not present

## 2016-06-22 DIAGNOSIS — R29898 Other symptoms and signs involving the musculoskeletal system: Secondary | ICD-10-CM

## 2016-06-22 DIAGNOSIS — M25621 Stiffness of right elbow, not elsewhere classified: Secondary | ICD-10-CM | POA: Diagnosis not present

## 2016-06-22 NOTE — Therapy (Signed)
[place], [place], [address] Phone: [phone]   Fax:  [phone]  Occupational Therapy Treatment  Patient Details  Name: Jennifer Coleman MRN: [id number] Date of Birth: [birth year] Referring Provider: Dr. Joni Fears  Encounter Date: 06/22/2016      OT End of Session - 06/22/16 1447    Visit Number 13   Number of Visits 24   Date for OT Re-Evaluation 07/13/16   Authorization Type UMR   OT Start Time 1400  Pt arrived late   OT Stop Time 1430   OT Time Calculation (min) 30 min   Activity Tolerance Patient tolerated treatment well   Behavior During Therapy [facility] for tasks assessed/performed      Past Medical History:  Diagnosis Date  . Depression   . Family history of anesthesia complication 25 yrs ago   Sister allergic to sccinocholine - unable to come off ventilator  . Hypertension   . PONV (postoperative nausea and vomiting)   . Shingles   . Varicose veins     Past Surgical History:  Procedure Laterality Date  . CARPAL TUNNEL RELEASE    . CERVICAL DISC SURGERY  1992   Fusion  . COLONOSCOPY N/A 06/07/2016   Procedure: COLONOSCOPY;  Surgeon: Rogene Houston, MD;  [facility];  Service: Endoscopy;  Laterality: N/A;  1:00  . KNEE ARTHROSCOPY  09/16/2012   Procedure: ARTHROSCOPY KNEE;  Surgeon: [name], MD;  Location: [place];  Service: Orthopedics;  Laterality: Right;  Right Knee Arthroscopy  . SALPINGOOPHORECTOMY Left 1985  . SHOULDER ARTHROSCOPY WITH OPEN ROTATOR CUFF REPAIR AND DISTAL CLAVICLE ACROMINECTOMY Right 05/03/2016   Procedure: RIGHT SHOULDER ARTHROSCOPY WITH MINI-OPEN ROTATOR CUFF REPAIR,DISTAL CLAVICLE RESECTION, SUBACROMIAL DECOMPRESSION.;  Surgeon: [name], MD;  Location: [place];  Service: Orthopedics;  Laterality: Right;    There were no vitals filed for this visit.      Subjective Assessment - 06/22/16 1421    Subjective  S: I was on  the computer for not even 5 minutes and my arm was hurting just laying there doing the mouse.    Currently in Pain? Yes   Pain Score 5    Pain Location Shoulder   Pain Orientation Right   Pain Descriptors / Indicators Aching   Pain Type Acute pain   Pain Radiating Towards neck to bicep   Pain Onset More than a month ago   Pain Frequency Constant   Aggravating Factors  movement and use   Pain Relieving Factors rest, medication, ice   Effect of Pain on Daily Activities limited use of the RUE during ADL tasks            [facility] OT Assessment - 06/22/16 1422      Assessment   Diagnosis S/P Right SAD, DCE, RCR and biceps tenodesis     Precautions   Precautions Shoulder   Type of Shoulder Precautions Begin AA/ROM for 4 weeks (until 07/11/16)                   OT Treatments/Exercises (OP) - 06/22/16 1423      Exercises   Exercises Shoulder     Shoulder Exercises: Supine   Protraction PROM;10 reps;AAROM  6X   Horizontal ABduction PROM;10 reps;AAROM  6X   External Rotation PROM;AAROM;10 reps   Internal Rotation PROM;AAROM;10 reps   Flexion PROM;10 reps;AAROM  6X   ABduction PROM;10 reps;AAROM  4X  Manual Therapy   Manual Therapy Myofascial release   Manual therapy comments manual therapy completed seperately from all other interventions this date   Myofascial Release myofascial release and manual stretching to right upper arm, scapular, shoulder region and associated areas to decrease pain and restrictions and imrpove pain free mobility in right shoulder.                    OT Short Term Goals - 05/18/16 1415      OT SHORT TERM GOAL #1   Title Patient will be educated on a HEP for right shoulder ROM and strength.   Time 6   Period Weeks   Status On-going     OT SHORT TERM GOAL #2   Title Patient will improve right shoulder and elbow P/ROM to [facility] in order to complete B/ADLs with increased independence.   Time 6   Period Weeks   Status  On-going     OT SHORT TERM GOAL #3   Title Patient will improve right shoulder and elbow strength to 3+/5 for increased ability to pick up baskets of laundry.   Time 6   Period Weeks   Status On-going     OT SHORT TERM GOAL #4   Title Patient will decrease right shoulder pain to 4/10 or better in order to complete B/ADLs with greater independence.   Time 6   Period Weeks   Status On-going     OT SHORT TERM GOAL #5   Title Patient will decrease fascial restrictions to moderate in right shoulder region for greater mobility needed for greater use of right arm as dominant.    Time 6   Period Weeks   Status On-going           OT Long Term Goals - 05/18/16 1415      OT LONG TERM GOAL #1   Title Patient will return to prior level of independence with all B/IADLs, work, and leisure activities using her right arm as dominant.    Time 12   Period Weeks   Status On-going     OT LONG TERM GOAL #2   Title Patient will improve right shoulder and elbow A/ROM to WNL for greater ability to use right arm as dominant at work and home.    Time 12   Period Weeks   Status On-going     OT LONG TERM GOAL #3   Title Patient will improve right shoulder and elbow strength to 5/5 for greater ability to lift bags of mulch when completing yardwork.     Time 12   Period Weeks   Status On-going     OT LONG TERM GOAL #4   Title Patient will decrease right shoulder pain to 2/10 or better when completing functional actiivites.    Time 12   Period Weeks   Status On-going     OT LONG TERM GOAL #5   Title Paitent will decrease right fascial restrictions in right shoulder in order to have greater mobility needed for use of right arm with functional activiites.     Time 12   Period Weeks   Status On-going               Plan - 06/22/16 1448    Clinical Impression Statement A: Pt continues to prgress with increased time due pain. Pt was able to complete more repetitions with AA/ROM. Add  supine abduction AA/ROM for the first time today. VC for form and  technique required and increased time for all exercises.    Plan P: Continue to work on increasing AA/ROM repetitions supine. Attempt seated AA/ROM as able to tolerate.       Patient will benefit from skilled therapeutic intervention in order to improve the following deficits and impairments:  Decreased range of motion, Decreased skin integrity, Decreased scar mobility, Decreased strength, Increased muscle spasms, Increased fascial restricitons, Impaired UE functional use, Pain  Visit Diagnosis: Stiffness of right shoulder, not elsewhere classified  Other symptoms and signs involving the musculoskeletal system  Pain in right shoulder    Problem List Patient Active Problem List   Diagnosis Date Noted  . Epigastric pain 08/11/2015  . Chest pain, rule out acute myocardial infarction 08/11/2015  . Renal insufficiency 08/11/2015  . Venous stasis 04/13/2015  . Depression 04/13/2015  . Essential hypertension 09/12/2014  . PVCs (premature ventricular contractions) 09/07/2014  . Precordial pain 09/07/2014   Ailene Ravel, OTR/L,CBIS  [phone]  06/22/2016, 2:54 PM  [place] [address] [place], [place], [address] Phone: [phone]   Fax:  [phone]  Name: Jennifer Coleman MRN: [id number] Date of Birth: December 28, 1950

## 2016-06-25 ENCOUNTER — Ambulatory Visit (HOSPITAL_COMMUNITY): Payer: 59 | Admitting: Specialist

## 2016-06-25 ENCOUNTER — Ambulatory Visit (INDEPENDENT_AMBULATORY_CARE_PROVIDER_SITE_OTHER): Payer: 59 | Admitting: Family Medicine

## 2016-06-25 ENCOUNTER — Encounter: Payer: Self-pay | Admitting: Family Medicine

## 2016-06-25 VITALS — BP 120/80 | Temp 98.1°F | Ht 66.0 in | Wt 237.4 lb

## 2016-06-25 DIAGNOSIS — M25611 Stiffness of right shoulder, not elsewhere classified: Secondary | ICD-10-CM

## 2016-06-25 DIAGNOSIS — R309 Painful micturition, unspecified: Secondary | ICD-10-CM | POA: Diagnosis not present

## 2016-06-25 DIAGNOSIS — M25621 Stiffness of right elbow, not elsewhere classified: Secondary | ICD-10-CM | POA: Diagnosis not present

## 2016-06-25 DIAGNOSIS — M25511 Pain in right shoulder: Secondary | ICD-10-CM

## 2016-06-25 DIAGNOSIS — R29898 Other symptoms and signs involving the musculoskeletal system: Secondary | ICD-10-CM

## 2016-06-25 LAB — POCT URINALYSIS DIPSTICK
Leukocytes, UA: NEGATIVE
pH, UA: 5

## 2016-06-25 MED ORDER — CIPROFLOXACIN HCL 500 MG PO TABS: 500.0000 mg | ORAL_TABLET | Freq: Two times a day (BID) | ORAL | 0 refills | Status: DC

## 2016-06-25 MED ORDER — IBUPROFEN 800 MG PO TABS: 800.0000 mg | ORAL_TABLET | Freq: Three times a day (TID) | ORAL | 3 refills | Status: DC | PRN

## 2016-06-25 NOTE — Progress Notes (Signed)
   Subjective:    Patient ID: Jennifer Coleman, female    DOB: [birth year], 65 y.o.   MRN: [id number]  Urinary Tract Infection   This is a new problem. The current episode started in the past 7 days. There has been no fever. Associated symptoms comments: Painful urination. Marland Kitchen    symptoms over the past several days pressure discomfort dysuria urinary from C denies high fever chills sweats Results for orders placed or performed in visit on 06/25/16  POCT urinalysis dipstick  Result Value Ref Range   Color, UA Yellow    Clarity, UA Clear    Glucose, UA     Bilirubin, UA     Ketones, UA     Spec Grav, UA <=1.005    Blood, UA     pH, UA 5.0    Protein, UA     Urobilinogen, UA     Nitrite, UA     Leukocytes, UA Negative Negative    Review of Systems     Objective:   Physical Exam  minimal tenderness on the right flank region abdomen soft no guarding rebound extremities no edema skin warm dry    UA would to be PVCs    Assessment & Plan:   culture pending Antibodies prescribed Warning signs discussed Follow-up if ongoing troubles Drink plenty of liquids

## 2016-06-25 NOTE — Therapy (Signed)
[place], [place], [address] Phone: [phone]   Fax:  [phone]  Occupational Therapy Treatment  Patient Details  Name: Jennifer Coleman MRN: [id number] Date of Birth: 08/29/1951 Referring Provider: Dr. Joni Fears  Encounter Date: 06/25/2016      OT End of Session - 06/25/16 1039    Visit Number 14   Number of Visits 24   Date for OT Re-Evaluation 07/13/16   Authorization Type UMR   OT Start Time 0951   OT Stop Time 1036   OT Time Calculation (min) 45 min   Activity Tolerance Patient tolerated treatment well   Behavior During Therapy [facility] for tasks assessed/performed      Past Medical History:  Diagnosis Date  . Depression   . Family history of anesthesia complication 25 yrs ago   Sister allergic to sccinocholine - unable to come off ventilator  . Hypertension   . PONV (postoperative nausea and vomiting)   . Shingles   . Varicose veins     Past Surgical History:  Procedure Laterality Date  . CARPAL TUNNEL RELEASE    . CERVICAL DISC SURGERY  1992   Fusion  . COLONOSCOPY N/A 06/07/2016   Procedure: COLONOSCOPY;  Surgeon: Rogene Houston, MD;  [facility];  Service: Endoscopy;  Laterality: N/A;  1:00  . KNEE ARTHROSCOPY  09/16/2012   Procedure: ARTHROSCOPY KNEE;  Surgeon: [name], MD;  Location: [place];  Service: Orthopedics;  Laterality: Right;  Right Knee Arthroscopy  . SALPINGOOPHORECTOMY Left 1985  . SHOULDER ARTHROSCOPY WITH OPEN ROTATOR CUFF REPAIR AND DISTAL CLAVICLE ACROMINECTOMY Right 05/03/2016   Procedure: RIGHT SHOULDER ARTHROSCOPY WITH MINI-OPEN ROTATOR CUFF REPAIR,DISTAL CLAVICLE RESECTION, SUBACROMIAL DECOMPRESSION.;  Surgeon: [name], MD;  Location: [place];  Service: Orthopedics;  Laterality: Right;    There were no vitals filed for this visit.      Subjective Assessment - 06/25/16 0953    Subjective  S:  Its getting better  slowly.   Currently in Pain? Yes   Pain Score 4    Pain Location Shoulder   Pain Orientation Right   Pain Descriptors / Indicators Aching;Throbbing            [facility] OT Assessment - 06/25/16 0001      Assessment   Diagnosis S/P Right SAD, DCE, RCR and biceps tenodesis     Precautions   Precautions Shoulder   Type of Shoulder Precautions Begin AA/ROM for 4 weeks (until 07/11/16)                   OT Treatments/Exercises (OP) - 06/25/16 0001      Exercises   Exercises Shoulder     Shoulder Exercises: Supine   Protraction PROM;5 reps;AAROM;10 reps   Horizontal ABduction PROM;5 reps;AAROM;10 reps   External Rotation PROM;5 reps;AAROM;10 reps   Internal Rotation PROM;5 reps;AAROM;10 reps   Flexion PROM;5 reps;AAROM;10 reps   ABduction PROM;5 reps;AAROM;10 reps     Shoulder Exercises: Seated   Elevation AROM;15 reps   Extension AROM;15 reps   Row AROM;15 reps     Shoulder Exercises: Pulleys   Flexion 1 minute   ABduction 1 minute     Shoulder Exercises: Therapy Ball   Flexion 15 reps   ABduction 15 reps     Manual Therapy   Manual Therapy Myofascial release   Manual therapy comments manual therapy completed seperately from all other interventions this date  Myofascial Release myofascial release and manual stretching to right upper arm, scapular, shoulder region and associated areas to decrease pain and restrictions and imrpove pain free mobility in right shoulder.                    OT Short Term Goals - 05/18/16 1415      OT SHORT TERM GOAL #1   Title Patient will be educated on a HEP for right shoulder ROM and strength.   Time 6   Period Weeks   Status On-going     OT SHORT TERM GOAL #2   Title Patient will improve right shoulder and elbow P/ROM to [facility] in order to complete B/ADLs with increased independence.   Time 6   Period Weeks   Status On-going     OT SHORT TERM GOAL #3   Title Patient will improve right shoulder and elbow  strength to 3+/5 for increased ability to pick up baskets of laundry.   Time 6   Period Weeks   Status On-going     OT SHORT TERM GOAL #4   Title Patient will decrease right shoulder pain to 4/10 or better in order to complete B/ADLs with greater independence.   Time 6   Period Weeks   Status On-going     OT SHORT TERM GOAL #5   Title Patient will decrease fascial restrictions to moderate in right shoulder region for greater mobility needed for greater use of right arm as dominant.    Time 6   Period Weeks   Status On-going           OT Long Term Goals - 05/18/16 1415      OT LONG TERM GOAL #1   Title Patient will return to prior level of independence with all B/IADLs, work, and leisure activities using her right arm as dominant.    Time 12   Period Weeks   Status On-going     OT LONG TERM GOAL #2   Title Patient will improve right shoulder and elbow A/ROM to WNL for greater ability to use right arm as dominant at work and home.    Time 12   Period Weeks   Status On-going     OT LONG TERM GOAL #3   Title Patient will improve right shoulder and elbow strength to 5/5 for greater ability to lift bags of mulch when completing yardwork.     Time 12   Period Weeks   Status On-going     OT LONG TERM GOAL #4   Title Patient will decrease right shoulder pain to 2/10 or better when completing functional actiivites.    Time 12   Period Weeks   Status On-going     OT LONG TERM GOAL #5   Title Paitent will decrease right fascial restrictions in right shoulder in order to have greater mobility needed for use of right arm with functional activiites.     Time 12   Period Weeks   Status On-going               Plan - 06/25/16 1040    Clinical Impression Statement A:  Completed AA/ROM in supine with therapist providing unweighting to patient's right arm with all AA/ROM exercises.  able to complete 10 repetitions with unweighting assitance.     Plan P:  Increase to 2'  with pulleys, add PVC pipe exercise in corner for improved AA/ROM needed in preparation for reaching across table.  Patient will benefit from skilled therapeutic intervention in order to improve the following deficits and impairments:  Decreased range of motion, Decreased skin integrity, Decreased scar mobility, Decreased strength, Increased muscle spasms, Increased fascial restricitons, Impaired UE functional use, Pain  Visit Diagnosis: Stiffness of right shoulder, not elsewhere classified  Other symptoms and signs involving the musculoskeletal system  Pain in right shoulder  Stiffness of right elbow, not elsewhere classified    Problem List Patient Active Problem List   Diagnosis Date Noted  . Epigastric pain 08/11/2015  . Chest pain, rule out acute myocardial infarction 08/11/2015  . Renal insufficiency 08/11/2015  . Venous stasis 04/13/2015  . Depression 04/13/2015  . Essential hypertension 09/12/2014  . PVCs (premature ventricular contractions) 09/07/2014  . Precordial pain 09/07/2014    Vangie Bicker, [place], OTR/L [phone]  06/25/2016, 10:42 AM  [place], [place], [address] Phone: [phone]   Fax:  [phone]  Name: Jennifer Coleman MRN: [id number] Date of Birth: April 17, 1951

## 2016-06-27 ENCOUNTER — Ambulatory Visit (HOSPITAL_COMMUNITY): Payer: 59

## 2016-06-27 ENCOUNTER — Encounter (HOSPITAL_COMMUNITY): Payer: Self-pay

## 2016-06-27 DIAGNOSIS — R29898 Other symptoms and signs involving the musculoskeletal system: Secondary | ICD-10-CM

## 2016-06-27 DIAGNOSIS — M25611 Stiffness of right shoulder, not elsewhere classified: Secondary | ICD-10-CM | POA: Diagnosis not present

## 2016-06-27 DIAGNOSIS — M25511 Pain in right shoulder: Secondary | ICD-10-CM

## 2016-06-27 DIAGNOSIS — M25621 Stiffness of right elbow, not elsewhere classified: Secondary | ICD-10-CM | POA: Diagnosis not present

## 2016-06-27 NOTE — Therapy (Addendum)
[place], [place], [address] Phone: [phone]   Fax:  [phone]  Occupational Therapy Treatment  Patient Details  Name: Jennifer Coleman MRN: [id number] Date of Birth: [birth year] Referring Provider: Dr. Joni Fears  Encounter Date: 06/27/2016      OT End of Session - 06/27/16 1424    Visit Number 15   Number of Visits 24   Date for OT Re-Evaluation 07/13/16   Authorization Type UMR   OT Start Time 1350   OT Stop Time 1430   OT Time Calculation (min) 40 min   Activity Tolerance Patient tolerated treatment well   Behavior During Therapy [facility] for tasks assessed/performed      Past Medical History:  Diagnosis Date  . Depression   . Family history of anesthesia complication 25 yrs ago   Sister allergic to sccinocholine - unable to come off ventilator  . Hypertension   . PONV (postoperative nausea and vomiting)   . Shingles   . Varicose veins     Past Surgical History:  Procedure Laterality Date  . CARPAL TUNNEL RELEASE    . CERVICAL DISC SURGERY  1992   Fusion  . COLONOSCOPY N/A 06/07/2016   Procedure: COLONOSCOPY;  Surgeon: Rogene Houston, MD;  [facility];  Service: Endoscopy;  Laterality: N/A;  1:00  . KNEE ARTHROSCOPY  09/16/2012   Procedure: ARTHROSCOPY KNEE;  Surgeon: [name], MD;  Location: [place];  Service: Orthopedics;  Laterality: Right;  Right Knee Arthroscopy  . SALPINGOOPHORECTOMY Left 1985  . SHOULDER ARTHROSCOPY WITH OPEN ROTATOR CUFF REPAIR AND DISTAL CLAVICLE ACROMINECTOMY Right 05/03/2016   Procedure: RIGHT SHOULDER ARTHROSCOPY WITH MINI-OPEN ROTATOR CUFF REPAIR,DISTAL CLAVICLE RESECTION, SUBACROMIAL DECOMPRESSION.;  Surgeon: [name], MD;  Location: [place];  Service: Orthopedics;  Laterality: Right;    There were no vitals filed for this visit.      Subjective Assessment - 06/27/16 1406    Subjective  S: I took a pain pill this  time.   Currently in Pain? Yes   Pain Score 5    Pain Location Shoulder   Pain Orientation Right   Pain Descriptors / Indicators Aching;Throbbing            [facility] OT Assessment - 06/27/16 1407      Assessment   Diagnosis S/P Right SAD, DCE, RCR and biceps tenodesis     Precautions   Precautions Shoulder   Type of Shoulder Precautions Begin AA/ROM for 4 weeks (until 07/11/16)                   OT Treatments/Exercises (OP) - 06/27/16 1408      Exercises   Exercises Shoulder     Shoulder Exercises: Supine   Protraction PROM;5 reps;AAROM;10 reps   Horizontal ABduction PROM;5 reps;AAROM;10 reps   External Rotation PROM;5 reps;AAROM;10 reps   Internal Rotation PROM;5 reps;AAROM;10 reps   Flexion PROM;5 reps;AAROM;10 reps   ABduction PROM;5 reps;AAROM;10 reps     Shoulder Exercises: Pulleys   Flexion 2 minutes   ABduction 2 minutes     Shoulder Exercises: ROM/Strengthening   Wall Wash 1'  therapist assisted with off loading arm.   Thumb Tacks 1'     Manual Therapy   Manual Therapy Myofascial release   Manual therapy comments manual therapy completed seperately from all other interventions this date   Myofascial Release myofascial release and manual stretching to right upper arm, scapular,  shoulder region and associated areas to decrease pain and restrictions and imrpove pain free mobility in right shoulder.                    OT Short Term Goals - 05/18/16 1415      OT SHORT TERM GOAL #1   Title Patient will be educated on a HEP for right shoulder ROM and strength.   Time 6   Period Weeks   Status On-going     OT SHORT TERM GOAL #2   Title Patient will improve right shoulder and elbow P/ROM to [facility] in order to complete B/ADLs with increased independence.   Time 6   Period Weeks   Status On-going     OT SHORT TERM GOAL #3   Title Patient will improve right shoulder and elbow strength to 3+/5 for increased ability to pick up baskets of  laundry.   Time 6   Period Weeks   Status On-going     OT SHORT TERM GOAL #4   Title Patient will decrease right shoulder pain to 4/10 or better in order to complete B/ADLs with greater independence.   Time 6   Period Weeks   Status On-going     OT SHORT TERM GOAL #5   Title Patient will decrease fascial restrictions to moderate in right shoulder region for greater mobility needed for greater use of right arm as dominant.    Time 6   Period Weeks   Status On-going           OT Long Term Goals - 05/18/16 1415      OT LONG TERM GOAL #1   Title Patient will return to prior level of independence with all B/IADLs, work, and leisure activities using her right arm as dominant.    Time 12   Period Weeks   Status On-going     OT LONG TERM GOAL #2   Title Patient will improve right shoulder and elbow A/ROM to WNL for greater ability to use right arm as dominant at work and home.    Time 12   Period Weeks   Status On-going     OT LONG TERM GOAL #3   Title Patient will improve right shoulder and elbow strength to 5/5 for greater ability to lift bags of mulch when completing yardwork.     Time 12   Period Weeks   Status On-going     OT LONG TERM GOAL #4   Title Patient will decrease right shoulder pain to 2/10 or better when completing functional actiivites.    Time 12   Period Weeks   Status On-going     OT LONG TERM GOAL #5   Title Paitent will decrease right fascial restrictions in right shoulder in order to have greater mobility needed for use of right arm with functional activiites.     Time 12   Period Weeks   Status On-going               Plan - 06/27/16 1425    Clinical Impression Statement A: Increased pain reported with supine AA/ROM exercises. Added wall wash with therapist assisting and increased pulley time to 2'.    Plan P: Add PVC pipe slide in corner to increase AA/ROM needed to participate in reaching across table. Add AA/ROM to HEP when  appropriate.      Patient will benefit from skilled therapeutic intervention in order to improve the following deficits and impairments:  Decreased range of motion, Decreased skin integrity, Decreased scar mobility, Decreased strength, Increased muscle spasms, Increased fascial restricitons, Impaired UE functional use, Pain  Visit Diagnosis: Stiffness of right shoulder, not elsewhere classified  Other symptoms and signs involving the musculoskeletal system  Pain in right shoulder    Problem List Patient Active Problem List   Diagnosis Date Noted  . Epigastric pain 08/11/2015  . Chest pain, rule out acute myocardial infarction 08/11/2015  . Renal insufficiency 08/11/2015  . Venous stasis 04/13/2015  . Depression 04/13/2015  . Essential hypertension 09/12/2014  . PVCs (premature ventricular contractions) 09/07/2014  . Precordial pain 09/07/2014   Ailene Ravel, OTR/L,CBIS  [phone]  06/27/2016, 2:27 PM  [place] [address] [place], [place], [address] Phone: [phone]   Fax:  [phone]  Name: Jennifer Coleman MRN: [id number] Date of Birth: 10/28/1951

## 2016-06-29 ENCOUNTER — Ambulatory Visit (HOSPITAL_COMMUNITY): Payer: 59 | Attending: Orthopaedic Surgery | Admitting: Specialist

## 2016-06-29 DIAGNOSIS — M25611 Stiffness of right shoulder, not elsewhere classified: Secondary | ICD-10-CM | POA: Insufficient documentation

## 2016-06-29 DIAGNOSIS — R29898 Other symptoms and signs involving the musculoskeletal system: Secondary | ICD-10-CM | POA: Insufficient documentation

## 2016-06-29 DIAGNOSIS — M25511 Pain in right shoulder: Secondary | ICD-10-CM | POA: Diagnosis not present

## 2016-06-29 DIAGNOSIS — M25621 Stiffness of right elbow, not elsewhere classified: Secondary | ICD-10-CM | POA: Insufficient documentation

## 2016-06-29 DIAGNOSIS — Z01818 Encounter for other preprocedural examination: Secondary | ICD-10-CM | POA: Diagnosis not present

## 2016-06-29 NOTE — Therapy (Signed)
[place], [place], [address] Phone: [phone]   Fax:  [phone]  Occupational Therapy Treatment  Patient Details  Name: Jennifer Coleman MRN: [id number] Date of Birth: 1951-01-23 Referring Provider: Dr. Joni Fears  Encounter Date: 06/29/2016      OT End of Session - 06/29/16 1045    Visit Number 16   Number of Visits 24   Date for OT Re-Evaluation 07/13/16   Authorization Type UMR   OT Start Time [phone]   OT Stop Time 1030   OT Time Calculation (min) 38 min   Activity Tolerance Patient tolerated treatment well   Behavior During Therapy [facility] for tasks assessed/performed      Past Medical History:  Diagnosis Date  . Depression   . Family history of anesthesia complication 25 yrs ago   Sister allergic to sccinocholine - unable to come off ventilator  . Hypertension   . PONV (postoperative nausea and vomiting)   . Shingles   . Varicose veins     Past Surgical History:  Procedure Laterality Date  . CARPAL TUNNEL RELEASE    . CERVICAL DISC SURGERY  1992   Fusion  . COLONOSCOPY N/A 06/07/2016   Procedure: COLONOSCOPY;  Surgeon: Rogene Houston, MD;  [facility];  Service: Endoscopy;  Laterality: N/A;  1:00  . KNEE ARTHROSCOPY  09/16/2012   Procedure: ARTHROSCOPY KNEE;  Surgeon: [name], MD;  Location: [place];  Service: Orthopedics;  Laterality: Right;  Right Knee Arthroscopy  . SALPINGOOPHORECTOMY Left 1985  . SHOULDER ARTHROSCOPY WITH OPEN ROTATOR CUFF REPAIR AND DISTAL CLAVICLE ACROMINECTOMY Right 05/03/2016   Procedure: RIGHT SHOULDER ARTHROSCOPY WITH MINI-OPEN ROTATOR CUFF REPAIR,DISTAL CLAVICLE RESECTION, SUBACROMIAL DECOMPRESSION.;  Surgeon: [name], MD;  Location: [place];  Service: Orthopedics;  Laterality: Right;    There were no vitals filed for this visit.      Subjective Assessment - 06/29/16 1044    Subjective  S:     Currently in Pain? Yes    Pain Score 5    Pain Location Shoulder   Pain Orientation Right   Pain Descriptors / Indicators Aching   Pain Type Acute pain   Pain Onset More than a month ago   Pain Frequency Constant   Aggravating Factors  movement and use   Pain Relieving Factors rest, ice   Effect of Pain on Daily Activities limited use of RUE with daily tasks            [facility] OT Assessment - 06/29/16 0001      Assessment   Diagnosis S/P Right SAD, DCE, RCR and biceps tenodesis     Precautions   Precautions Shoulder   Type of Shoulder Precautions Begin AA/ROM for 4 weeks (until 07/11/16)                   OT Treatments/Exercises (OP) - 06/29/16 0001      Shoulder Exercises: Supine   Protraction PROM;5 reps;AAROM;10 reps   Horizontal ABduction PROM;5 reps;AAROM;10 reps   External Rotation PROM;5 reps;AAROM;10 reps   Internal Rotation PROM;5 reps;AAROM;10 reps   Flexion PROM;5 reps;AAROM;10 reps   ABduction PROM;5 reps;AAROM;10 reps     Shoulder Exercises: Seated   Elevation AROM;15 reps   Extension AROM;15 reps   Row AROM;15 reps     Shoulder Exercises: Pulleys   Flexion 2 minutes   ABduction 2 minutes     Shoulder Exercises: Therapy  Ball   Flexion 15 reps   ABduction 15 reps     Shoulder Exercises: ROM/Strengthening   Wall Wash 1'   Thumb Tacks 1'     Manual Therapy   Manual Therapy Myofascial release   Manual therapy comments manual therapy completed seperately from all other interventions this date   Myofascial Release myofascial release and manual stretching to right upper arm, scapular, shoulder region and associated areas to decrease pain and restrictions and imrpove pain free mobility in right shoulder.                    OT Short Term Goals - 05/18/16 1415      OT SHORT TERM GOAL #1   Title Patient will be educated on a HEP for right shoulder ROM and strength.   Time 6   Period Weeks   Status On-going     OT SHORT TERM GOAL #2   Title Patient  will improve right shoulder and elbow P/ROM to [facility] in order to complete B/ADLs with increased independence.   Time 6   Period Weeks   Status On-going     OT SHORT TERM GOAL #3   Title Patient will improve right shoulder and elbow strength to 3+/5 for increased ability to pick up baskets of laundry.   Time 6   Period Weeks   Status On-going     OT SHORT TERM GOAL #4   Title Patient will decrease right shoulder pain to 4/10 or better in order to complete B/ADLs with greater independence.   Time 6   Period Weeks   Status On-going     OT SHORT TERM GOAL #5   Title Patient will decrease fascial restrictions to moderate in right shoulder region for greater mobility needed for greater use of right arm as dominant.    Time 6   Period Weeks   Status On-going           OT Long Term Goals - 05/18/16 1415      OT LONG TERM GOAL #1   Title Patient will return to prior level of independence with all B/IADLs, work, and leisure activities using her right arm as dominant.    Time 12   Period Weeks   Status On-going     OT LONG TERM GOAL #2   Title Patient will improve right shoulder and elbow A/ROM to WNL for greater ability to use right arm as dominant at work and home.    Time 12   Period Weeks   Status On-going     OT LONG TERM GOAL #3   Title Patient will improve right shoulder and elbow strength to 5/5 for greater ability to lift bags of mulch when completing yardwork.     Time 12   Period Weeks   Status On-going     OT LONG TERM GOAL #4   Title Patient will decrease right shoulder pain to 2/10 or better when completing functional actiivites.    Time 12   Period Weeks   Status On-going     OT LONG TERM GOAL #5   Title Paitent will decrease right fascial restrictions in right shoulder in order to have greater mobility needed for use of right arm with functional activiites.     Time 12   Period Weeks   Status On-going               Plan - 06/29/16 1046     Clinical Impression Statement  A:  P/ROM and AA/ROM less painful this date.  able to complete 5 repetitions of corner PVC stretch, however requested to stop after 5 due to pain.   Plan P:  attempt 10 repetitions of PVC pipe slide to increase AA/ROM needed to participate in reaching across the table.  attempt ball circles for improved  internal rotation needed to fasten bra.      Patient will benefit from skilled therapeutic intervention in order to improve the following deficits and impairments:     Visit Diagnosis: Stiffness of right shoulder, not elsewhere classified  Other symptoms and signs involving the musculoskeletal system  Pain in right shoulder  Stiffness of right elbow, not elsewhere classified    Problem List Patient Active Problem List   Diagnosis Date Noted  . Epigastric pain 08/11/2015  . Chest pain, rule out acute myocardial infarction 08/11/2015  . Renal insufficiency 08/11/2015  . Venous stasis 04/13/2015  . Depression 04/13/2015  . Essential hypertension 09/12/2014  . PVCs (premature ventricular contractions) 09/07/2014  . Precordial pain 09/07/2014    Vangie Bicker, [place], OTR/L [phone]  06/29/2016, 10:52 AM  [place], [place], [address] Phone: [phone]   Fax:  [phone]  Name: ANOOSHA BOEH MRN: [id number] Date of Birth: 12/28/1950

## 2016-06-30 LAB — URINE CULTURE

## 2016-07-03 ENCOUNTER — Encounter (HOSPITAL_COMMUNITY): Payer: Self-pay | Admitting: Occupational Therapy

## 2016-07-03 ENCOUNTER — Ambulatory Visit (HOSPITAL_COMMUNITY): Payer: 59 | Admitting: Occupational Therapy

## 2016-07-03 DIAGNOSIS — M25511 Pain in right shoulder: Secondary | ICD-10-CM

## 2016-07-03 DIAGNOSIS — R29898 Other symptoms and signs involving the musculoskeletal system: Secondary | ICD-10-CM | POA: Diagnosis not present

## 2016-07-03 DIAGNOSIS — M25611 Stiffness of right shoulder, not elsewhere classified: Secondary | ICD-10-CM | POA: Diagnosis not present

## 2016-07-03 DIAGNOSIS — Z01818 Encounter for other preprocedural examination: Secondary | ICD-10-CM | POA: Diagnosis not present

## 2016-07-03 DIAGNOSIS — M25621 Stiffness of right elbow, not elsewhere classified: Secondary | ICD-10-CM | POA: Diagnosis not present

## 2016-07-03 NOTE — Therapy (Signed)
[place], [place], [address] Phone: [phone]   Fax:  [phone]  Occupational Therapy Treatment  Patient Details  Name: Jennifer Coleman MRN: [id number] Date of Birth: May 19, 1951 Referring Provider: Dr. Joni Fears  Encounter Date: 07/03/2016      OT End of Session - 07/03/16 1136    Visit Number 17   Number of Visits 24   Date for OT Re-Evaluation 07/13/16   Authorization Type UMR   OT Start Time 1033   OT Stop Time 1116   OT Time Calculation (min) 43 min   Activity Tolerance Patient tolerated treatment well   Behavior During Therapy [facility] for tasks assessed/performed      Past Medical History:  Diagnosis Date  . Depression   . Family history of anesthesia complication 25 yrs ago   Sister allergic to sccinocholine - unable to come off ventilator  . Hypertension   . PONV (postoperative nausea and vomiting)   . Shingles   . Varicose veins     Past Surgical History:  Procedure Laterality Date  . CARPAL TUNNEL RELEASE    . CERVICAL DISC SURGERY  1992   Fusion  . COLONOSCOPY N/A 06/07/2016   Procedure: COLONOSCOPY;  Surgeon: Rogene Houston, MD;  [facility];  Service: Endoscopy;  Laterality: N/A;  1:00  . KNEE ARTHROSCOPY  09/16/2012   Procedure: ARTHROSCOPY KNEE;  Surgeon: [name], MD;  Location: [place];  Service: Orthopedics;  Laterality: Right;  Right Knee Arthroscopy  . SALPINGOOPHORECTOMY Left 1985  . SHOULDER ARTHROSCOPY WITH OPEN ROTATOR CUFF REPAIR AND DISTAL CLAVICLE ACROMINECTOMY Right 05/03/2016   Procedure: RIGHT SHOULDER ARTHROSCOPY WITH MINI-OPEN ROTATOR CUFF REPAIR,DISTAL CLAVICLE RESECTION, SUBACROMIAL DECOMPRESSION.;  Surgeon: [name], MD;  Location: [place];  Service: Orthopedics;  Laterality: Right;    There were no vitals filed for this visit.      Subjective Assessment - 07/03/16 1034    Subjective  S: It feels like it needs to  click today.    Currently in Pain? Yes   Pain Score 5    Pain Location Shoulder   Pain Orientation Right   Pain Descriptors / Indicators Aching   Pain Type Acute pain   Pain Radiating Towards neck to bicep   Pain Onset More than a month ago   Pain Frequency Constant   Aggravating Factors  movement and use   Pain Relieving Factors rest, ice   Effect of Pain on Daily Activities limited use of RUE with daily tasks   Multiple Pain Sites No            [facility] OT Assessment - 07/03/16 1033      Assessment   Diagnosis S/P Right SAD, DCE, RCR and biceps tenodesis     Precautions   Precautions Shoulder   Type of Shoulder Precautions Begin AA/ROM for 4 weeks (until 07/11/16)                   OT Treatments/Exercises (OP) - 07/03/16 1037      Exercises   Exercises Shoulder     Shoulder Exercises: Supine   Protraction PROM;5 reps;AAROM;10 reps   Horizontal ABduction PROM;5 reps;AAROM;10 reps   External Rotation PROM;5 reps;AAROM;10 reps   Internal Rotation PROM;5 reps;AAROM;10 reps   Flexion PROM;5 reps;AAROM;10 reps   ABduction PROM;5 reps;AAROM;10 reps     Shoulder Exercises: Therapy Ball   Right/Left --  3 reps each  direction     Manual Therapy   Manual Therapy Myofascial release   Manual therapy comments manual therapy completed seperately from all other interventions this date   Myofascial Release myofascial release and manual stretching to right upper arm, scapular, shoulder region and associated areas to decrease pain and restrictions and imrpove pain free mobility in right shoulder.                    OT Short Term Goals - 05/18/16 1415      OT SHORT TERM GOAL #1   Title Patient will be educated on a HEP for right shoulder ROM and strength.   Time 6   Period Weeks   Status On-going     OT SHORT TERM GOAL #2   Title Patient will improve right shoulder and elbow P/ROM to [facility] in order to complete B/ADLs with increased independence.   Time 6    Period Weeks   Status On-going     OT SHORT TERM GOAL #3   Title Patient will improve right shoulder and elbow strength to 3+/5 for increased ability to pick up baskets of laundry.   Time 6   Period Weeks   Status On-going     OT SHORT TERM GOAL #4   Title Patient will decrease right shoulder pain to 4/10 or better in order to complete B/ADLs with greater independence.   Time 6   Period Weeks   Status On-going     OT SHORT TERM GOAL #5   Title Patient will decrease fascial restrictions to moderate in right shoulder region for greater mobility needed for greater use of right arm as dominant.    Time 6   Period Weeks   Status On-going           OT Long Term Goals - 05/18/16 1415      OT LONG TERM GOAL #1   Title Patient will return to prior level of independence with all B/IADLs, work, and leisure activities using her right arm as dominant.    Time 12   Period Weeks   Status On-going     OT LONG TERM GOAL #2   Title Patient will improve right shoulder and elbow A/ROM to WNL for greater ability to use right arm as dominant at work and home.    Time 12   Period Weeks   Status On-going     OT LONG TERM GOAL #3   Title Patient will improve right shoulder and elbow strength to 5/5 for greater ability to lift bags of mulch when completing yardwork.     Time 12   Period Weeks   Status On-going     OT LONG TERM GOAL #4   Title Patient will decrease right shoulder pain to 2/10 or better when completing functional actiivites.    Time 12   Period Weeks   Status On-going     OT LONG TERM GOAL #5   Title Paitent will decrease right fascial restrictions in right shoulder in order to have greater mobility needed for use of right arm with functional activiites.     Time 12   Period Weeks   Status On-going               Plan - 07/03/16 1136    Clinical Impression Statement A: Added therapy ball circles working on internal rotation, OT assist at end range of RUE  with extension, pt reports more difficulty when bringing ball forward using  anterior deltoid for flexion. Did not complete PVC pipe this session due to time constraints. Minimal increase in pain at end of session.    Rehab Potential Good   OT Frequency 2x / week   OT Duration 12 weeks   OT Treatment/Interventions Self-care/ADL training;Electrical Stimulation;Moist Heat;Cryotherapy;Ultrasound;Therapeutic exercise;Neuromuscular education;DME and/or AE instruction;Energy conservation;Manual Therapy;Passive range of motion;Scar mobilization;Therapeutic exercises;Therapeutic activities;Patient/family education   Plan P: Resume missed exercises, attempt 10 repetitions of PVC slide; add AA/ROM in sitting when pt able to tolerate      Patient will benefit from skilled therapeutic intervention in order to improve the following deficits and impairments:  Decreased range of motion, Decreased skin integrity, Decreased scar mobility, Decreased strength, Increased muscle spasms, Increased fascial restricitons, Impaired UE functional use, Pain  Visit Diagnosis: Stiffness of right shoulder, not elsewhere classified  Other symptoms and signs involving the musculoskeletal system  Pain in right shoulder    Problem List Patient Active Problem List   Diagnosis Date Noted  . Epigastric pain 08/11/2015  . Chest pain, rule out acute myocardial infarction 08/11/2015  . Renal insufficiency 08/11/2015  . Venous stasis 04/13/2015  . Depression 04/13/2015  . Essential hypertension 09/12/2014  . PVCs (premature ventricular contractions) 09/07/2014  . Precordial pain 09/07/2014   Guadelupe Sabin, OTR/L  [phone] 07/03/2016, 11:40 AM  [place] [address] [place], [place], [address] Phone: [phone]   Fax:  [phone]  Name: Jennifer Coleman MRN: [id number] Date of Birth: [birth year]

## 2016-07-04 ENCOUNTER — Ambulatory Visit (HOSPITAL_COMMUNITY): Payer: 59 | Admitting: Specialist

## 2016-07-04 DIAGNOSIS — M25611 Stiffness of right shoulder, not elsewhere classified: Secondary | ICD-10-CM | POA: Diagnosis not present

## 2016-07-04 DIAGNOSIS — M25511 Pain in right shoulder: Secondary | ICD-10-CM | POA: Diagnosis not present

## 2016-07-04 DIAGNOSIS — R29898 Other symptoms and signs involving the musculoskeletal system: Secondary | ICD-10-CM | POA: Diagnosis not present

## 2016-07-04 DIAGNOSIS — M25621 Stiffness of right elbow, not elsewhere classified: Secondary | ICD-10-CM | POA: Diagnosis not present

## 2016-07-04 DIAGNOSIS — Z01818 Encounter for other preprocedural examination: Secondary | ICD-10-CM | POA: Diagnosis not present

## 2016-07-04 NOTE — Therapy (Signed)
[place], [place], [address] Phone: [phone]   Fax:  [phone]  Occupational Therapy Treatment  Patient Details  Name: Jennifer Coleman MRN: [id number] Date of Birth: 1951/01/09 Referring Provider: Dr. Joni Fears  Encounter Date: 07/04/2016      OT End of Session - 07/04/16 1403    Visit Number 18   Number of Visits 24   Date for OT Re-Evaluation 07/13/16   Authorization Type UMR   OT Start Time 1302   OT Stop Time 1350   OT Time Calculation (min) 48 min   Activity Tolerance Patient tolerated treatment well   Behavior During Therapy [facility] for tasks assessed/performed      Past Medical History:  Diagnosis Date  . Depression   . Family history of anesthesia complication 25 yrs ago   Sister allergic to sccinocholine - unable to come off ventilator  . Hypertension   . PONV (postoperative nausea and vomiting)   . Shingles   . Varicose veins     Past Surgical History:  Procedure Laterality Date  . CARPAL TUNNEL RELEASE    . CERVICAL DISC SURGERY  1992   Fusion  . COLONOSCOPY N/A 06/07/2016   Procedure: COLONOSCOPY;  Surgeon: Rogene Houston, MD;  [facility];  Service: Endoscopy;  Laterality: N/A;  1:00  . KNEE ARTHROSCOPY  09/16/2012   Procedure: ARTHROSCOPY KNEE;  Surgeon: [name], MD;  Location: [place];  Service: Orthopedics;  Laterality: Right;  Right Knee Arthroscopy  . SALPINGOOPHORECTOMY Left 1985  . SHOULDER ARTHROSCOPY WITH OPEN ROTATOR CUFF REPAIR AND DISTAL CLAVICLE ACROMINECTOMY Right 05/03/2016   Procedure: RIGHT SHOULDER ARTHROSCOPY WITH MINI-OPEN ROTATOR CUFF REPAIR,DISTAL CLAVICLE RESECTION, SUBACROMIAL DECOMPRESSION.;  Surgeon: [name], MD;  Location: [place];  Service: Orthopedics;  Laterality: Right;    There were no vitals filed for this visit.      Subjective Assessment - 07/04/16 1306    Subjective  S:  it feels like my shoulder  or elbow just needs to pop.   Currently in Pain? Yes   Pain Score 4    Pain Location Shoulder   Pain Orientation Right   Pain Descriptors / Indicators Aching   Pain Type Acute pain            [facility] OT Assessment - 07/04/16 0001      Assessment   Diagnosis S/P Right SAD, DCE, RCR and biceps tenodesis     Precautions   Precautions Shoulder   Type of Shoulder Precautions Begin AA/ROM for 4 weeks (until 07/11/16)                   OT Treatments/Exercises (OP) - 07/04/16 0001      Bed Mobility   Bed Mobility --     Exercises   Exercises Shoulder     Shoulder Exercises: Supine   Protraction PROM;5 reps;AAROM;15 reps   Horizontal ABduction PROM;5 reps;AAROM;15 reps   External Rotation PROM;5 reps;AAROM;15 reps   Internal Rotation PROM;5 reps;AAROM;15 reps   Flexion PROM;5 reps;AAROM;15 reps   ABduction PROM;5 reps;AAROM;15 reps     Shoulder Exercises: Seated   Elevation AROM;15 reps   Extension AROM;15 reps   Row AROM;15 reps     Shoulder Exercises: Standing   Other Standing Exercises PVC pipe slide; 7X;      Shoulder Exercises: Therapy Ball   Flexion 15 reps   ABduction 15 reps   Right/Left  5 reps   Right/Left Limitations with mod facilitation from OT to transfer ball behind her body     Shoulder Exercises: ROM/Strengthening   Wall Wash 1'   Thumb Tacks 1'                  OT Short Term Goals - 05/18/16 1415      OT SHORT TERM GOAL #1   Title Patient will be educated on a HEP for right shoulder ROM and strength.   Time 6   Period Weeks   Status On-going     OT SHORT TERM GOAL #2   Title Patient will improve right shoulder and elbow P/ROM to [facility] in order to complete B/ADLs with increased independence.   Time 6   Period Weeks   Status On-going     OT SHORT TERM GOAL #3   Title Patient will improve right shoulder and elbow strength to 3+/5 for increased ability to pick up baskets of laundry.   Time 6   Period Weeks   Status  On-going     OT SHORT TERM GOAL #4   Title Patient will decrease right shoulder pain to 4/10 or better in order to complete B/ADLs with greater independence.   Time 6   Period Weeks   Status On-going     OT SHORT TERM GOAL #5   Title Patient will decrease fascial restrictions to moderate in right shoulder region for greater mobility needed for greater use of right arm as dominant.    Time 6   Period Weeks   Status On-going           OT Long Term Goals - 05/18/16 1415      OT LONG TERM GOAL #1   Title Patient will return to prior level of independence with all B/IADLs, work, and leisure activities using her right arm as dominant.    Time 12   Period Weeks   Status On-going     OT LONG TERM GOAL #2   Title Patient will improve right shoulder and elbow A/ROM to WNL for greater ability to use right arm as dominant at work and home.    Time 12   Period Weeks   Status On-going     OT LONG TERM GOAL #3   Title Patient will improve right shoulder and elbow strength to 5/5 for greater ability to lift bags of mulch when completing yardwork.     Time 12   Period Weeks   Status On-going     OT LONG TERM GOAL #4   Title Patient will decrease right shoulder pain to 2/10 or better when completing functional actiivites.    Time 12   Period Weeks   Status On-going     OT LONG TERM GOAL #5   Title Paitent will decrease right fascial restrictions in right shoulder in order to have greater mobility needed for use of right arm with functional activiites.     Time 12   Period Weeks   Status On-going               Plan - 07/04/16 1403    Clinical Impression Statement A:  continued to require assistance with most AA/ROM exercises due to pain and arm fatigue.  increased dowel rod AA/ROM exercises, patient was able to complete PVC AA/ROM exercise without faciltation this date.    Plan P:  Attempt A/ROM in seated position, complete ball circles with less facilitation.  Patient will benefit from skilled therapeutic intervention in order to improve the following deficits and impairments:  Decreased range of motion, Decreased skin integrity, Decreased scar mobility, Decreased strength, Increased muscle spasms, Increased fascial restricitons, Impaired UE functional use, Pain  Visit Diagnosis: Stiffness of right shoulder, not elsewhere classified  Other symptoms and signs involving the musculoskeletal system  Pain in right shoulder  Stiffness of right elbow, not elsewhere classified    Problem List Patient Active Problem List   Diagnosis Date Noted  . Epigastric pain 08/11/2015  . Chest pain, rule out acute myocardial infarction 08/11/2015  . Renal insufficiency 08/11/2015  . Venous stasis 04/13/2015  . Depression 04/13/2015  . Essential hypertension 09/12/2014  . PVCs (premature ventricular contractions) 09/07/2014  . Precordial pain 09/07/2014    Vangie Bicker, [place], OTR/L [phone]  07/04/2016, 2:07 PM  [place] [address] [place], [place], [address] Phone: [phone]   Fax:  [phone]  Name: Jennifer Coleman MRN: [id number] Date of Birth: [birth year]

## 2016-07-09 ENCOUNTER — Ambulatory Visit (HOSPITAL_COMMUNITY): Payer: 59 | Admitting: Specialist

## 2016-07-09 DIAGNOSIS — M25611 Stiffness of right shoulder, not elsewhere classified: Secondary | ICD-10-CM

## 2016-07-09 DIAGNOSIS — M25511 Pain in right shoulder: Secondary | ICD-10-CM

## 2016-07-09 DIAGNOSIS — Z01818 Encounter for other preprocedural examination: Secondary | ICD-10-CM | POA: Diagnosis not present

## 2016-07-09 DIAGNOSIS — M25621 Stiffness of right elbow, not elsewhere classified: Secondary | ICD-10-CM | POA: Diagnosis not present

## 2016-07-09 DIAGNOSIS — Z01419 Encounter for gynecological examination (general) (routine) without abnormal findings: Secondary | ICD-10-CM | POA: Diagnosis not present

## 2016-07-09 DIAGNOSIS — R29898 Other symptoms and signs involving the musculoskeletal system: Secondary | ICD-10-CM

## 2016-07-09 DIAGNOSIS — Z6841 Body Mass Index (BMI) 40.0 and over, adult: Secondary | ICD-10-CM | POA: Diagnosis not present

## 2016-07-09 NOTE — Therapy (Signed)
[place], [place], [address] Phone: [phone]   Fax:  [phone]  Occupational Therapy Treatment  Patient Details  Name: Jennifer Coleman MRN: [id number] Date of Birth: Nov 24, 1950 Referring Provider: Dr. Joni Fears  Encounter Date: 07/09/2016      OT End of Session - 07/09/16 1204    Visit Number 19   Number of Visits 24   Date for OT Re-Evaluation 07/13/16   Authorization Type UMR   OT Start Time 0953   OT Stop Time 1033   OT Time Calculation (min) 40 min   Activity Tolerance Patient tolerated treatment well   Behavior During Therapy [facility] for tasks assessed/performed      Past Medical History:  Diagnosis Date  . Depression   . Family history of anesthesia complication 25 yrs ago   Sister allergic to sccinocholine - unable to come off ventilator  . Hypertension   . PONV (postoperative nausea and vomiting)   . Shingles   . Varicose veins     Past Surgical History:  Procedure Laterality Date  . CARPAL TUNNEL RELEASE    . CERVICAL DISC SURGERY  1992   Fusion  . COLONOSCOPY N/A 06/07/2016   Procedure: COLONOSCOPY;  Surgeon: Rogene Houston, MD;  [facility];  Service: Endoscopy;  Laterality: N/A;  1:00  . KNEE ARTHROSCOPY  09/16/2012   Procedure: ARTHROSCOPY KNEE;  Surgeon: [name], MD;  Location: [place];  Service: Orthopedics;  Laterality: Right;  Right Knee Arthroscopy  . SALPINGOOPHORECTOMY Left 1985  . SHOULDER ARTHROSCOPY WITH OPEN ROTATOR CUFF REPAIR AND DISTAL CLAVICLE ACROMINECTOMY Right 05/03/2016   Procedure: RIGHT SHOULDER ARTHROSCOPY WITH MINI-OPEN ROTATOR CUFF REPAIR,DISTAL CLAVICLE RESECTION, SUBACROMIAL DECOMPRESSION.;  Surgeon: [name], MD;  Location: [place];  Service: Orthopedics;  Laterality: Right;    There were no vitals filed for this visit.      Subjective Assessment - 07/09/16 0953    Subjective  S:  I just am discouraged.  I  don't know how my arm is supposed to be doing    Currently in Pain? Yes   Pain Score 4    Pain Location Shoulder   Pain Orientation Right   Pain Descriptors / Indicators Aching   Pain Type Acute pain   Pain Onset More than a month ago   Pain Frequency Constant            [facility] OT Assessment - 07/09/16 0001      Assessment   Diagnosis S/P Right SAD, DCE, RCR and biceps tenodesis     Precautions   Precautions Shoulder   Type of Shoulder Precautions Begin AA/ROM for 4 weeks (until 07/11/16)                   OT Treatments/Exercises (OP) - 07/09/16 0001      Exercises   Exercises Shoulder     Shoulder Exercises: Supine   Protraction PROM;AAROM;5 reps   Horizontal ABduction PROM;AAROM;5 reps   External Rotation PROM;AAROM;5 reps   Internal Rotation PROM;AAROM;5 reps   Flexion PROM;AAROM;5 reps   ABduction PROM;AAROM;5 reps     Shoulder Exercises: Seated   Elevation AROM;15 reps   Extension AROM;15 reps   Row AROM;15 reps   Protraction AAROM;5 reps   External Rotation AAROM;5 reps   Internal Rotation AAROM;5 reps   Flexion AAROM;5 reps   Abduction AAROM;5 reps     Shoulder Exercises: Standing  Other Standing Exercises PVC pipe slide; 8X;      Shoulder Exercises: Therapy Ball   Flexion 15 reps   ABduction 15 reps   Right/Left 5 reps   Right/Left Limitations with min facilitation from OT to transfer ball behind her body     Manual Therapy   Manual Therapy Myofascial release   Manual therapy comments manual therapy completed seperately from all other interventions this date   Myofascial Release myofascial release and manual stretching to right upper arm, scapular, shoulder region and associated areas to decrease pain and restrictions and imrpove pain free mobility in right shoulder.                    OT Short Term Goals - 05/18/16 1415      OT SHORT TERM GOAL #1   Title Patient will be educated on a HEP for right shoulder ROM and  strength.   Time 6   Period Weeks   Status On-going     OT SHORT TERM GOAL #2   Title Patient will improve right shoulder and elbow P/ROM to [facility] in order to complete B/ADLs with increased independence.   Time 6   Period Weeks   Status On-going     OT SHORT TERM GOAL #3   Title Patient will improve right shoulder and elbow strength to 3+/5 for increased ability to pick up baskets of laundry.   Time 6   Period Weeks   Status On-going     OT SHORT TERM GOAL #4   Title Patient will decrease right shoulder pain to 4/10 or better in order to complete B/ADLs with greater independence.   Time 6   Period Weeks   Status On-going     OT SHORT TERM GOAL #5   Title Patient will decrease fascial restrictions to moderate in right shoulder region for greater mobility needed for greater use of right arm as dominant.    Time 6   Period Weeks   Status On-going           OT Long Term Goals - 05/18/16 1415      OT LONG TERM GOAL #1   Title Patient will return to prior level of independence with all B/IADLs, work, and leisure activities using her right arm as dominant.    Time 12   Period Weeks   Status On-going     OT LONG TERM GOAL #2   Title Patient will improve right shoulder and elbow A/ROM to WNL for greater ability to use right arm as dominant at work and home.    Time 12   Period Weeks   Status On-going     OT LONG TERM GOAL #3   Title Patient will improve right shoulder and elbow strength to 5/5 for greater ability to lift bags of mulch when completing yardwork.     Time 12   Period Weeks   Status On-going     OT LONG TERM GOAL #4   Title Patient will decrease right shoulder pain to 2/10 or better when completing functional actiivites.    Time 12   Period Weeks   Status On-going     OT LONG TERM GOAL #5   Title Paitent will decrease right fascial restrictions in right shoulder in order to have greater mobility needed for use of right arm with functional activiites.      Time 12   Period Weeks   Status On-going  Plan - 07/09/16 1204    Clinical Impression Statement A:  Patient transitioned to seated AA/ROM this date with mod vg and tactile cues to depress scapula with flexion, protraction, abduction required.     Plan P:  Recertification, begin A/ROM as tolerated and begin functional reaching activities as patient is having difficulty reaching items at waist height.    Consulted and Agree with Plan of Care Patient      Patient will benefit from skilled therapeutic intervention in order to improve the following deficits and impairments:  Decreased range of motion, Decreased skin integrity, Decreased scar mobility, Decreased strength, Increased muscle spasms, Increased fascial restricitons, Impaired UE functional use, Pain  Visit Diagnosis: Stiffness of right shoulder, not elsewhere classified  Other symptoms and signs involving the musculoskeletal system  Pain in right shoulder  Stiffness of right elbow, not elsewhere classified    Problem List Patient Active Problem List   Diagnosis Date Noted  . Epigastric pain 08/11/2015  . Chest pain, rule out acute myocardial infarction 08/11/2015  . Renal insufficiency 08/11/2015  . Venous stasis 04/13/2015  . Depression 04/13/2015  . Essential hypertension 09/12/2014  . PVCs (premature ventricular contractions) 09/07/2014  . Precordial pain 09/07/2014    Vangie Bicker, [place], OTR/L [phone]  07/09/2016, 12:07 PM  [place] [address] [place], [place], [address] Phone: [phone]   Fax:  [phone]  Name: Jennifer Coleman MRN: [id number] Date of Birth: 1951/09/17

## 2016-07-11 ENCOUNTER — Other Ambulatory Visit (HOSPITAL_COMMUNITY): Payer: Self-pay | Admitting: Orthopaedic Surgery

## 2016-07-11 ENCOUNTER — Encounter (HOSPITAL_COMMUNITY): Payer: Self-pay

## 2016-07-11 ENCOUNTER — Ambulatory Visit (HOSPITAL_COMMUNITY): Payer: 59

## 2016-07-11 DIAGNOSIS — R29898 Other symptoms and signs involving the musculoskeletal system: Secondary | ICD-10-CM

## 2016-07-11 DIAGNOSIS — M25511 Pain in right shoulder: Secondary | ICD-10-CM | POA: Diagnosis not present

## 2016-07-11 DIAGNOSIS — M810 Age-related osteoporosis without current pathological fracture: Secondary | ICD-10-CM

## 2016-07-11 DIAGNOSIS — M25611 Stiffness of right shoulder, not elsewhere classified: Secondary | ICD-10-CM | POA: Diagnosis not present

## 2016-07-11 DIAGNOSIS — M25621 Stiffness of right elbow, not elsewhere classified: Secondary | ICD-10-CM | POA: Diagnosis not present

## 2016-07-11 DIAGNOSIS — Z01818 Encounter for other preprocedural examination: Secondary | ICD-10-CM | POA: Diagnosis not present

## 2016-07-11 NOTE — Therapy (Signed)
[place], [place], [address] Phone: [phone]   Fax:  [phone]  Occupational Therapy Treatment And reassessment/re-cert Patient Details  Name: Jennifer Coleman MRN: [id number] Date of Birth: 1950-12-31 Referring Provider: Dr. Joni Fears  Encounter Date: 07/11/2016      OT End of Session - 07/11/16 0939    Visit Number 20   Number of Visits 24   Date for OT Re-Evaluation 08/10/16   Authorization Type UMR   OT Start Time 0900  reassessment   OT Stop Time 0945   OT Time Calculation (min) 45 min   Activity Tolerance Patient tolerated treatment well   Behavior During Therapy [facility] for tasks assessed/performed      Past Medical History:  Diagnosis Date  . Depression   . Family history of anesthesia complication 25 yrs ago   Sister allergic to sccinocholine - unable to come off ventilator  . Hypertension   . PONV (postoperative nausea and vomiting)   . Shingles   . Varicose veins     Past Surgical History:  Procedure Laterality Date  . CARPAL TUNNEL RELEASE    . CERVICAL DISC SURGERY  1992   Fusion  . COLONOSCOPY N/A 06/07/2016   Procedure: COLONOSCOPY;  Surgeon: Rogene Houston, MD;  [facility];  Service: Endoscopy;  Laterality: N/A;  1:00  . KNEE ARTHROSCOPY  09/16/2012   Procedure: ARTHROSCOPY KNEE;  Surgeon: [name], MD;  Location: [place];  Service: Orthopedics;  Laterality: Right;  Right Knee Arthroscopy  . SALPINGOOPHORECTOMY Left 1985  . SHOULDER ARTHROSCOPY WITH OPEN ROTATOR CUFF REPAIR AND DISTAL CLAVICLE ACROMINECTOMY Right 05/03/2016   Procedure: RIGHT SHOULDER ARTHROSCOPY WITH MINI-OPEN ROTATOR CUFF REPAIR,DISTAL CLAVICLE RESECTION, SUBACROMIAL DECOMPRESSION.;  Surgeon: [name], MD;  Location: [place];  Service: Orthopedics;  Laterality: Right;    There were no vitals filed for this visit.      Subjective Assessment - 07/11/16 1100    Subjective  S: I know it'll take time to get my arm better.   Currently in Pain? Yes   Pain Score 4    Pain Location Shoulder   Pain Orientation Right   Pain Descriptors / Indicators Aching   Pain Type Acute pain   Pain Radiating Towards neck to bicep   Pain Onset More than a month ago   Pain Frequency Constant   Aggravating Factors  movement and use   Pain Relieving Factors rest and ice   Effect of Pain on Daily Activities limited use of RUE during daily tasks   Multiple Pain Sites No            [facility] OT Assessment - 07/11/16 0910      Assessment   Diagnosis S/P Right SAD, DCE, RCR and biceps tenodesis   Onset Date 05/03/16     Precautions   Precautions Shoulder   Type of Shoulder Precautions Begin AA/ROM for 4 weeks (until 07/11/16)      Prior Function   Level of Independence Independent     AROM   Overall AROM Comments Assessed supine. IR/er adducted. A/ROM not assessed prior to this session.   AROM Assessment Site Shoulder   Right/Left Shoulder Right   Right Shoulder Flexion 10 Degrees   Right Shoulder ABduction 75 Degrees   Right Shoulder Internal Rotation 82 Degrees   Right Shoulder External Rotation 75 Degrees     PROM   Overall PROM Comments assessed in  supine, external and internal rotation with shoulder adducted   PROM Assessment Site Shoulder   Right/Left Shoulder Right   Right Shoulder Flexion 130 Degrees  previous: 105   Right Shoulder ABduction 155 Degrees  previous: 100   Right Shoulder Internal Rotation 90 Degrees  previous: 90   Right Shoulder External Rotation 63 Degrees  previous: 35     Strength   Overall Strength Unable to assess;Due to pain                  OT Treatments/Exercises (OP) - 07/11/16 0928      Exercises   Exercises Shoulder     Shoulder Exercises: Supine   Protraction PROM;5 reps;AAROM;10 reps   Horizontal ABduction PROM;5 reps;AAROM;10 reps   External Rotation PROM;5 reps;AAROM;10 reps   Internal  Rotation PROM;5 reps;AAROM;10 reps   Flexion PROM;5 reps;AAROM;10 reps   ABduction PROM;5 reps;AAROM;10 reps     Shoulder Exercises: Standing   Protraction AAROM;5 reps   Flexion AAROM;5 reps     Shoulder Exercises: ROM/Strengthening   Wall Wash 1'     Manual Therapy   Manual Therapy Myofascial release   Manual therapy comments manual therapy completed seperately from all other interventions this date   Myofascial Release myofascial release and manual stretching to right upper arm, scapular, shoulder region and associated areas to decrease pain and restrictions and imrpove pain free mobility in right shoulder.     Muscle Energy Technique Muscle energy  technique completed to right anterior deltoid to relax tone and muscle spasm and improve range of motion.                  OT Education - 07/11/16 0925    Education provided Yes   Education Details AA/ROM exercises   Person(s) Educated Patient   Methods Explanation;Handout;Verbal cues;Demonstration   Comprehension Verbalized understanding;Returned demonstration          OT Short Term Goals - 07/11/16 0938      OT SHORT TERM GOAL #1   Title Patient will be educated on a HEP for right shoulder ROM and strength.   Time 6   Period Weeks   Status On-going     OT SHORT TERM GOAL #2   Title Patient will improve right shoulder and elbow P/ROM to [facility] in order to complete B/ADLs with increased independence.   Time 6   Period Weeks   Status Achieved     OT SHORT TERM GOAL #3   Title Patient will improve right shoulder and elbow strength to 3+/5 for increased ability to pick up baskets of laundry.   Time 6   Period Weeks   Status On-going     OT SHORT TERM GOAL #4   Title Patient will decrease right shoulder pain to 4/10 or better in order to complete B/ADLs with greater independence.   Time 6   Period Weeks   Status On-going     OT SHORT TERM GOAL #5   Title Patient will decrease fascial restrictions to moderate in  right shoulder region for greater mobility needed for greater use of right arm as dominant.    Time 6   Period Weeks   Status Achieved           OT Long Term Goals - 05/18/16 1415      OT LONG TERM GOAL #1   Title Patient will return to prior level of independence with all B/IADLs, work, and leisure activities using her right arm as  dominant.    Time 12   Period Weeks   Status On-going     OT LONG TERM GOAL #2   Title Patient will improve right shoulder and elbow A/ROM to WNL for greater ability to use right arm as dominant at work and home.    Time 12   Period Weeks   Status On-going     OT LONG TERM GOAL #3   Title Patient will improve right shoulder and elbow strength to 5/5 for greater ability to lift bags of mulch when completing yardwork.     Time 12   Period Weeks   Status On-going     OT LONG TERM GOAL #4   Title Patient will decrease right shoulder pain to 2/10 or better when completing functional actiivites.    Time 12   Period Weeks   Status On-going     OT LONG TERM GOAL #5   Title Paitent will decrease right fascial restrictions in right shoulder in order to have greater mobility needed for use of right arm with functional activiites.     Time 12   Period Weeks   Status On-going               Plan - 07/11/16 1104    Clinical Impression Statement A: Reassessment completd this session. Patient has met 2/5 STGs and is progressing towards therapy goals. Patient is able to progress to A/ROM this date although is not physicallly ready as she is limited by pain for progressing. Pt continues to have deficits in pain, strength, ROM , and fascial restrictions. Pt reports that she tries to use her right arm during daily tasks although she isn't able to do too much.    Plan P: Continue therapy 3x a week for 4 weeks to continue working on increasing functional use of RUE during daily tasks. Continue with AA/ROM supine and standing as well as PVC slide. Follow up  on MD appointment.       Patient will benefit from skilled therapeutic intervention in order to improve the following deficits and impairments:  Decreased range of motion, Decreased skin integrity, Decreased scar mobility, Decreased strength, Increased muscle spasms, Increased fascial restricitons, Impaired UE functional use, Pain  Visit Diagnosis: Stiffness of right shoulder, not elsewhere classified - Plan: Ot plan of care cert/re-cert  Other symptoms and signs involving the musculoskeletal system - Plan: Ot plan of care cert/re-cert  Pain in right shoulder - Plan: Ot plan of care cert/re-cert  Stiffness of right elbow, not elsewhere classified - Plan: Ot plan of care cert/re-cert    Problem List Patient Active Problem List   Diagnosis Date Noted  . Epigastric pain 08/11/2015  . Chest pain, rule out acute myocardial infarction 08/11/2015  . Renal insufficiency 08/11/2015  . Venous stasis 04/13/2015  . Depression 04/13/2015  . Essential hypertension 09/12/2014  . PVCs (premature ventricular contractions) 09/07/2014  . Precordial pain 09/07/2014   Ailene Ravel, OTR/L,CBIS  [phone]  07/11/2016, 12:32 PM  [place] [address] [place], [place], [address] Phone: [phone]   Fax:  [phone]  Name: Jennifer Coleman MRN: [id number] Date of Birth: 1951-09-12

## 2016-07-11 NOTE — Patient Instructions (Signed)
Perform each exercise ____10____ reps. 2-3x days.   Protraction - STANDING  Start by holding a wand or cane at chest height.  Next, slowly push the wand outwards in front of your body so that your elbows become fully straightened. Then, return to the original position.     Shoulder FLEXION - STANDING - PALMS DOWN  In the standing position, hold a wand/cane with both arms, palms down on both sides. Raise up the wand/cane allowing your unaffected arm to perform most of the effort. Your affected arm should be partially relaxed.      Internal/External ROTATION - STANDING  In the standing position, hold a wand/cane with both hands keeping your elbows bent. Move your arms and wand/cane to one side.  Your affected arm should be partially relaxed while your unaffected arm performs most of the effort.       Shoulder ABDUCTION - STANDING  While holding a wand/cane palm face up on the injured side and palm face down on the uninjured side, slowly raise up your injured arm to the side.       Horizontal Abduction/Adduction      Straight arms holding cane at shoulder height, bring cane to right, center, left. Repeat starting to left.   Copyright  VHI. All rights reserved.

## 2016-07-13 ENCOUNTER — Ambulatory Visit (HOSPITAL_COMMUNITY): Payer: 59 | Admitting: Occupational Therapy

## 2016-07-13 ENCOUNTER — Encounter (HOSPITAL_COMMUNITY): Payer: Self-pay | Admitting: Occupational Therapy

## 2016-07-13 DIAGNOSIS — M25611 Stiffness of right shoulder, not elsewhere classified: Secondary | ICD-10-CM | POA: Diagnosis not present

## 2016-07-13 DIAGNOSIS — R29898 Other symptoms and signs involving the musculoskeletal system: Secondary | ICD-10-CM

## 2016-07-13 DIAGNOSIS — M25511 Pain in right shoulder: Secondary | ICD-10-CM | POA: Diagnosis not present

## 2016-07-13 DIAGNOSIS — Z01818 Encounter for other preprocedural examination: Secondary | ICD-10-CM | POA: Diagnosis not present

## 2016-07-13 DIAGNOSIS — M25621 Stiffness of right elbow, not elsewhere classified: Secondary | ICD-10-CM | POA: Diagnosis not present

## 2016-07-13 NOTE — Therapy (Signed)
[facility] [address] [place], [place], [address] Phone: [phone]   Fax:  [phone]  Occupational Therapy Treatment  Patient Details  Name: Jennifer Coleman MRN: [id number] Date of Birth: Mar 01, 1951 Referring Provider: Dr. Norlene Campbell  Encounter Date: 07/13/2016      OT End of Session - 07/13/16 1123    Visit Number 21   Number of Visits 24   Date for OT Re-Evaluation 08/10/16   Authorization Type UMR   OT Start Time 1037   OT Stop Time 1117   OT Time Calculation (min) 40 min   Activity Tolerance Patient tolerated treatment well   Behavior During Therapy [facility] for tasks assessed/performed      Past Medical History:  Diagnosis Date  . Depression   . Family history of anesthesia complication 25 yrs ago   Sister allergic to sccinocholine - unable to come off ventilator  . Hypertension   . PONV (postoperative nausea and vomiting)   . Shingles   . Varicose veins     Past Surgical History:  Procedure Laterality Date  . CARPAL TUNNEL RELEASE    . CERVICAL DISC SURGERY  1992   Fusion  . COLONOSCOPY N/A 06/07/2016   Procedure: COLONOSCOPY;  Surgeon: Malissa Hippo, MD;  [facility];  Service: Endoscopy;  Laterality: N/A;  1:00  . KNEE ARTHROSCOPY  09/16/2012   Procedure: ARTHROSCOPY KNEE;  Surgeon: Valeria Batman, MD;  Location: [facility] OR;  Service: Orthopedics;  Laterality: Right;  Right Knee Arthroscopy  . SALPINGOOPHORECTOMY Left 1985  . SHOULDER ARTHROSCOPY WITH OPEN ROTATOR CUFF REPAIR AND DISTAL CLAVICLE ACROMINECTOMY Right 05/03/2016   Procedure: RIGHT SHOULDER ARTHROSCOPY WITH MINI-OPEN ROTATOR CUFF REPAIR,DISTAL CLAVICLE RESECTION, SUBACROMIAL DECOMPRESSION.;  Surgeon: Valeria Batman, MD;  Location: [facility];  Service: Orthopedics;  Laterality: Right;    There were no vitals filed for this visit.          [facility] OT Assessment - 07/13/16 1039      Assessment   Diagnosis S/P Right  SAD, DCE, RCR and biceps tenodesis     Precautions   Precautions Shoulder   Type of Shoulder Precautions Begin AA/ROM for 4 weeks (until 07/11/16)                   OT Treatments/Exercises (OP) - 07/13/16 1058      Exercises   Exercises Shoulder     Shoulder Exercises: Supine   Protraction PROM;5 reps   Horizontal ABduction PROM;5 reps   External Rotation PROM;5 reps   Internal Rotation PROM;5 reps   Flexion PROM;5 reps   ABduction PROM;5 reps     Shoulder Exercises: Seated   Protraction AAROM;10 reps   External Rotation AAROM;10 reps   Internal Rotation AAROM;10 reps   Flexion AAROM;10 reps   Abduction AAROM;10 reps     Shoulder Exercises: Standing   Other Standing Exercises PVC pipe slide; 10X;      Shoulder Exercises: Therapy Ball   Right/Left 5 reps  each side   Right/Left Limitations with min facilitation from OT to transfer ball behind her body     Shoulder Exercises: ROM/Strengthening   Wall Wash 1'   Prot/Ret//Elev/Dep 1'     Manual Therapy   Manual Therapy Myofascial release   Manual therapy comments manual therapy completed seperately from all other interventions this date   Myofascial Release myofascial release and manual stretching to right upper arm, scapular, shoulder region and  associated areas to decrease pain and restrictions and imrpove pain free mobility in right shoulder.                    OT Short Term Goals - 07/11/16 5697      OT SHORT TERM GOAL #1   Title Patient will be educated on a HEP for right shoulder ROM and strength.   Time 6   Period Weeks   Status On-going     OT SHORT TERM GOAL #2   Title Patient will improve right shoulder and elbow P/ROM to [facility] in order to complete B/ADLs with increased independence.   Time 6   Period Weeks   Status Achieved     OT SHORT TERM GOAL #3   Title Patient will improve right shoulder and elbow strength to 3+/5 for increased ability to pick up baskets of laundry.   Time 6    Period Weeks   Status On-going     OT SHORT TERM GOAL #4   Title Patient will decrease right shoulder pain to 4/10 or better in order to complete B/ADLs with greater independence.   Time 6   Period Weeks   Status On-going     OT SHORT TERM GOAL #5   Title Patient will decrease fascial restrictions to moderate in right shoulder region for greater mobility needed for greater use of right arm as dominant.    Time 6   Period Weeks   Status Achieved           OT Long Term Goals - 05/18/16 1415      OT LONG TERM GOAL #1   Title Patient will return to prior level of independence with all B/IADLs, work, and leisure activities using her right arm as dominant.    Time 12   Period Weeks   Status On-going     OT LONG TERM GOAL #2   Title Patient will improve right shoulder and elbow A/ROM to WNL for greater ability to use right arm as dominant at work and home.    Time 12   Period Weeks   Status On-going     OT LONG TERM GOAL #3   Title Patient will improve right shoulder and elbow strength to 5/5 for greater ability to lift bags of mulch when completing yardwork.     Time 12   Period Weeks   Status On-going     OT LONG TERM GOAL #4   Title Patient will decrease right shoulder pain to 2/10 or better when completing functional actiivites.    Time 12   Period Weeks   Status On-going     OT LONG TERM GOAL #5   Title Paitent will decrease right fascial restrictions in right shoulder in order to have greater mobility needed for use of right arm with functional activiites.     Time 12   Period Weeks   Status On-going               Plan - 07/13/16 1124    Clinical Impression Statement A: AA/ROM completed in sitting today, OT provided min tactile facilitation at pt request to assist/cue to depress shoulder. Added prot/ret/elev/dep with verbal cuing and tactile assist for correct technique. Pt reports MD stated she had a "stiff shoulder" and to keep moving and not  sitting/resting the shoulder.    Plan P: Continud with AA/ROM working on depressing shoulder, progress to A/ROM when able to tolerate.  Patient will benefit from skilled therapeutic intervention in order to improve the following deficits and impairments:  Decreased range of motion, Decreased skin integrity, Decreased scar mobility, Decreased strength, Increased muscle spasms, Increased fascial restricitons, Impaired UE functional use, Pain  Visit Diagnosis: Stiffness of right shoulder, not elsewhere classified  Other symptoms and signs involving the musculoskeletal system  Pain in right shoulder    Problem List Patient Active Problem List   Diagnosis Date Noted  . Epigastric pain 08/11/2015  . Chest pain, rule out acute myocardial infarction 08/11/2015  . Renal insufficiency 08/11/2015  . Venous stasis 04/13/2015  . Depression 04/13/2015  . Essential hypertension 09/12/2014  . PVCs (premature ventricular contractions) 09/07/2014  . Precordial pain 09/07/2014   Guadelupe Sabin, OTR/L  [phone] 07/13/2016, 11:27 AM  [facility] [address] [place], [place], [address] Phone: [phone]   Fax:  [phone]  Name: Jennifer Coleman MRN: [id number] Date of Birth: Jun 24, 1951

## 2016-07-16 ENCOUNTER — Other Ambulatory Visit: Payer: Self-pay | Admitting: Family Medicine

## 2016-07-16 ENCOUNTER — Ambulatory Visit (HOSPITAL_COMMUNITY): Payer: 59 | Admitting: Specialist

## 2016-07-16 DIAGNOSIS — M25511 Pain in right shoulder: Secondary | ICD-10-CM | POA: Diagnosis not present

## 2016-07-16 DIAGNOSIS — M25611 Stiffness of right shoulder, not elsewhere classified: Secondary | ICD-10-CM

## 2016-07-16 DIAGNOSIS — Z01818 Encounter for other preprocedural examination: Secondary | ICD-10-CM | POA: Diagnosis not present

## 2016-07-16 DIAGNOSIS — M25621 Stiffness of right elbow, not elsewhere classified: Secondary | ICD-10-CM | POA: Diagnosis not present

## 2016-07-16 DIAGNOSIS — R29898 Other symptoms and signs involving the musculoskeletal system: Secondary | ICD-10-CM | POA: Diagnosis not present

## 2016-07-16 MED FILL — HYDROCHLOROTHIAZIDE 25 MG T: 25 | 90 days supply | Qty: 90 | Fill #0

## 2016-07-16 MED FILL — ESCITALOPRAM 20 MG TABLET: 20 | 90 days supply | Qty: 90 | Fill #1

## 2016-07-16 MED FILL — ENALAPRIL MALEATE 20 MG TAB: 20 | 90 days supply | Qty: 180 | Fill #0

## 2016-07-16 NOTE — Therapy (Signed)
[place], [place], [address] Phone: [phone]   Fax:  [phone]  Occupational Therapy Treatment  Patient Details  Name: Jennifer Coleman MRN: [id number] Date of Birth: 1951-07-16 Referring Provider: Dr. Joni Fears  Encounter Date: 07/16/2016      OT End of Session - 07/16/16 2227    Visit Number 22   Number of Visits 24   Date for OT Re-Evaluation 08/10/16   Authorization Type UMR   OT Start Time 1125   OT Stop Time 1210   OT Time Calculation (min) 45 min   Activity Tolerance Patient tolerated treatment well      Past Medical History:  Diagnosis Date  . Depression   . Family history of anesthesia complication 25 yrs ago   Sister allergic to sccinocholine - unable to come off ventilator  . Hypertension   . PONV (postoperative nausea and vomiting)   . Shingles   . Varicose veins     Past Surgical History:  Procedure Laterality Date  . CARPAL TUNNEL RELEASE    . CERVICAL DISC SURGERY  1992   Fusion  . COLONOSCOPY N/A 06/07/2016   Procedure: COLONOSCOPY;  Surgeon: Rogene Houston, MD;  [facility];  Service: Endoscopy;  Laterality: N/A;  1:00  . KNEE ARTHROSCOPY  09/16/2012   Procedure: ARTHROSCOPY KNEE;  Surgeon: [name], MD;  Location: [facility];  Service: Orthopedics;  Laterality: Right;  Right Knee Arthroscopy  . SALPINGOOPHORECTOMY Left 1985  . SHOULDER ARTHROSCOPY WITH OPEN ROTATOR CUFF REPAIR AND DISTAL CLAVICLE ACROMINECTOMY Right 05/03/2016   Procedure: RIGHT SHOULDER ARTHROSCOPY WITH MINI-OPEN ROTATOR CUFF REPAIR,DISTAL CLAVICLE RESECTION, SUBACROMIAL DECOMPRESSION.;  Surgeon: [name], MD;  Location: [facility];  Service: Orthopedics;  Laterality: Right;    There were no vitals filed for this visit.      Subjective Assessment - 07/16/16 1125    Subjective  S:  the MD is afraid my shoulder will be stiff because of the extensive damage he had to  repair.    Currently in Pain? Yes   Pain Score 4    Pain Location Shoulder   Pain Orientation Right   Pain Descriptors / Indicators Aching   Pain Type Acute pain            [facility] OT Assessment - 07/16/16 0001      Assessment   Diagnosis S/P Right SAD, DCE, RCR and biceps tenodesis     Precautions   Precautions Shoulder   Type of Shoulder Precautions Begin AA/ROM for 4 weeks (until 07/11/16)                   OT Treatments/Exercises (OP) - 07/16/16 0001      Exercises   Exercises Shoulder     Shoulder Exercises: Supine   Protraction PROM;AROM;5 reps   Horizontal ABduction PROM;5 reps   External Rotation PROM;AROM;5 reps   Internal Rotation PROM;AROM;5 reps   Flexion PROM;AROM;5 reps   ABduction AROM;PROM;5 reps     Shoulder Exercises: Seated   Protraction AAROM;10 reps   Horizontal ABduction AAROM;10 reps   External Rotation AAROM;10 reps   Internal Rotation AAROM;10 reps   Flexion AAROM;10 reps   Abduction AAROM;10 reps     Shoulder Exercises: Standing   Extension Theraband;10 reps   Theraband Level (Shoulder Extension) Level 2 (Red)   Row Theraband;10 reps   Theraband Level (Shoulder Row) Level 2 (Red)   Other  Standing Exercises PVC pipe slide; 10X; each flexion and abduction     Shoulder Exercises: ROM/Strengthening   Other ROM/Strengthening Exercises graduated pinch tree placed red and yellow clothespins from bucket to horizontal and then horizontal to vertical pole with moderate diffiuclty and verbal guidance and tactile cues for proper reaching technique and posture     Manual Therapy   Manual Therapy Myofascial release   Manual therapy comments manual therapy completed seperately from all other interventions this date   Myofascial Release myofascial release and manual stretching to right upper arm, scapular, shoulder region and associated areas to decrease pain and restrictions and imrpove pain free mobility in right shoulder.                     OT Short Term Goals - 07/11/16 [id number]      OT SHORT TERM GOAL #1   Title Patient will be educated on a HEP for right shoulder ROM and strength.   Time 6   Period Weeks   Status On-going     OT SHORT TERM GOAL #2   Title Patient will improve right shoulder and elbow P/ROM to [facility] in order to complete B/ADLs with increased independence.   Time 6   Period Weeks   Status Achieved     OT SHORT TERM GOAL #3   Title Patient will improve right shoulder and elbow strength to 3+/5 for increased ability to pick up baskets of laundry.   Time 6   Period Weeks   Status On-going     OT SHORT TERM GOAL #4   Title Patient will decrease right shoulder pain to 4/10 or better in order to complete B/ADLs with greater independence.   Time 6   Period Weeks   Status On-going     OT SHORT TERM GOAL #5   Title Patient will decrease fascial restrictions to moderate in right shoulder region for greater mobility needed for greater use of right arm as dominant.    Time 6   Period Weeks   Status Achieved           OT Long Term Goals - 05/18/16 1415      OT LONG TERM GOAL #1   Title Patient will return to prior level of independence with all B/IADLs, work, and leisure activities using her right arm as dominant.    Time 12   Period Weeks   Status On-going     OT LONG TERM GOAL #2   Title Patient will improve right shoulder and elbow A/ROM to WNL for greater ability to use right arm as dominant at work and home.    Time 12   Period Weeks   Status On-going     OT LONG TERM GOAL #3   Title Patient will improve right shoulder and elbow strength to 5/5 for greater ability to lift bags of mulch when completing yardwork.     Time 12   Period Weeks   Status On-going     OT LONG TERM GOAL #4   Title Patient will decrease right shoulder pain to 2/10 or better when completing functional actiivites.    Time 12   Period Weeks   Status On-going     OT LONG TERM GOAL #5    Title Paitent will decrease right fascial restrictions in right shoulder in order to have greater mobility needed for use of right arm with functional activiites.     Time 12   Period Weeks  Status On-going               Plan - 07/16/16 2227    Clinical Impression Statement A:  began A/ROM in supine with max difficulty and pain.  Patient continues to complain of pain in anterior shoulder region with any shoulder movement.     Plan P:  Use ultrasound for pain relief in anterior shoulder region.  Decrease pain and improve independence with supine A/ROM      Patient will benefit from skilled therapeutic intervention in order to improve the following deficits and impairments:  Decreased range of motion, Decreased skin integrity, Decreased scar mobility, Decreased strength, Increased muscle spasms, Increased fascial restricitons, Impaired UE functional use, Pain  Visit Diagnosis: Stiffness of right shoulder, not elsewhere classified  Other symptoms and signs involving the musculoskeletal system  Pain in right shoulder  Stiffness of right elbow, not elsewhere classified    Problem List Patient Active Problem List   Diagnosis Date Noted  . Epigastric pain 08/11/2015  . Chest pain, rule out acute myocardial infarction 08/11/2015  . Renal insufficiency 08/11/2015  . Venous stasis 04/13/2015  . Depression 04/13/2015  . Essential hypertension 09/12/2014  . PVCs (premature ventricular contractions) 09/07/2014  . Precordial pain 09/07/2014    Vangie Bicker, [place], OTR/L [phone]  07/16/2016, 10:30 PM  [facility] [address] [place], [place], [address] Phone: [phone]   Fax:  [phone]  Name: Jennifer Coleman MRN: [id number] Date of Birth: August 17, 1951

## 2016-07-17 ENCOUNTER — Other Ambulatory Visit: Payer: Self-pay | Admitting: Physician Assistant

## 2016-07-17 DIAGNOSIS — D0471 Carcinoma in situ of skin of right lower limb, including hip: Secondary | ICD-10-CM | POA: Diagnosis not present

## 2016-07-17 DIAGNOSIS — D0462 Carcinoma in situ of skin of left upper limb, including shoulder: Secondary | ICD-10-CM | POA: Diagnosis not present

## 2016-07-17 MED FILL — ESTRADIOL-NORETH 0.5-0.1 MG: 0.5-0.1 | 84 days supply | Qty: 84 | Fill #0

## 2016-07-18 ENCOUNTER — Ambulatory Visit (HOSPITAL_COMMUNITY): Payer: 59 | Admitting: Occupational Therapy

## 2016-07-18 ENCOUNTER — Encounter (HOSPITAL_COMMUNITY): Payer: Self-pay | Admitting: Occupational Therapy

## 2016-07-18 DIAGNOSIS — M25621 Stiffness of right elbow, not elsewhere classified: Secondary | ICD-10-CM | POA: Diagnosis not present

## 2016-07-18 DIAGNOSIS — R29898 Other symptoms and signs involving the musculoskeletal system: Secondary | ICD-10-CM | POA: Diagnosis not present

## 2016-07-18 DIAGNOSIS — M25511 Pain in right shoulder: Secondary | ICD-10-CM

## 2016-07-18 DIAGNOSIS — M25611 Stiffness of right shoulder, not elsewhere classified: Secondary | ICD-10-CM | POA: Diagnosis not present

## 2016-07-18 DIAGNOSIS — Z01818 Encounter for other preprocedural examination: Secondary | ICD-10-CM | POA: Diagnosis not present

## 2016-07-18 NOTE — Therapy (Signed)
[place], [place], [address] Phone: [phone]   Fax:  [phone]  Occupational Therapy Treatment  Patient Details  Name: Jennifer Coleman MRN: [id number] Date of Birth: 17-Jun-1951 Referring Provider: Dr. Joni Fears  Encounter Date: 07/18/2016      OT End of Session - 07/18/16 1202    Visit Number 23   Number of Visits 24   Date for OT Re-Evaluation 08/10/16   Authorization Type UMR   OT Start Time 1120   OT Stop Time 1159   OT Time Calculation (min) 39 min   Activity Tolerance Patient tolerated treatment well      Past Medical History:  Diagnosis Date  . Depression   . Family history of anesthesia complication 25 yrs ago   Sister allergic to sccinocholine - unable to come off ventilator  . Hypertension   . PONV (postoperative nausea and vomiting)   . Shingles   . Varicose veins     Past Surgical History:  Procedure Laterality Date  . CARPAL TUNNEL RELEASE    . CERVICAL DISC SURGERY  1992   Fusion  . COLONOSCOPY N/A 06/07/2016   Procedure: COLONOSCOPY;  Surgeon: Rogene Houston, MD;  [facility];  Service: Endoscopy;  Laterality: N/A;  1:00  . KNEE ARTHROSCOPY  09/16/2012   Procedure: ARTHROSCOPY KNEE;  Surgeon: [name], MD;  Location: [facility];  Service: Orthopedics;  Laterality: Right;  Right Knee Arthroscopy  . SALPINGOOPHORECTOMY Left 1985  . SHOULDER ARTHROSCOPY WITH OPEN ROTATOR CUFF REPAIR AND DISTAL CLAVICLE ACROMINECTOMY Right 05/03/2016   Procedure: RIGHT SHOULDER ARTHROSCOPY WITH MINI-OPEN ROTATOR CUFF REPAIR,DISTAL CLAVICLE RESECTION, SUBACROMIAL DECOMPRESSION.;  Surgeon: [name], MD;  Location: [facility];  Service: Orthopedics;  Laterality: Right;    There were no vitals filed for this visit.      Subjective Assessment - 07/18/16 1202    Subjective  S: I was really sore after the last session.    Currently in Pain? Yes   Pain Score 3     Pain Location Shoulder   Pain Orientation Right   Pain Descriptors / Indicators Aching   Pain Type Acute pain   Pain Radiating Towards neck to bicep   Pain Onset More than a month ago   Pain Frequency Constant   Aggravating Factors  movement and use   Pain Relieving Factors rest and ice   Effect of Pain on Daily Activities limited use of RUE during daily tasks   Multiple Pain Sites No            [facility] OT Assessment - 07/18/16 1124      Assessment   Diagnosis S/P Right SAD, DCE, RCR and biceps tenodesis     Precautions   Precautions Shoulder   Type of Shoulder Precautions Begin AA/ROM for 4 weeks (until 07/11/16)                   OT Treatments/Exercises (OP) - 07/18/16 1142      Exercises   Exercises Shoulder     Shoulder Exercises: Supine   Protraction PROM;AROM;5 reps   Horizontal ABduction PROM;AROM;5 reps   External Rotation PROM;AROM;5 reps   Internal Rotation PROM;AROM;5 reps   Flexion PROM;AROM;5 reps   ABduction AROM;PROM;5 reps     Shoulder Exercises: Seated   Protraction AAROM;10 reps   Horizontal ABduction AAROM;10 reps   External Rotation AAROM;10 reps   Internal Rotation AAROM;10 reps  Flexion AAROM;10 reps   Abduction AAROM;10 reps     Shoulder Exercises: Standing   Extension Theraband;10 reps   Theraband Level (Shoulder Extension) Level 2 (Red)   Row Theraband;10 reps   Theraband Level (Shoulder Row) Level 2 (Red)     Shoulder Exercises: ROM/Strengthening   Wall Wash 1'     Modalities   Modalities Ultrasound     Ultrasound   Ultrasound Location right anterior deltoid   Ultrasound Parameters 1.5 mHz   Ultrasound Goals Pain     Manual Therapy   Manual Therapy Myofascial release   Manual therapy comments manual therapy completed seperately from all other interventions this date   Myofascial Release myofascial release and manual stretching to right upper arm, scapular, shoulder region and associated areas to decrease pain  and restrictions and imrpove pain free mobility in right shoulder.                    OT Short Term Goals - 07/11/16 [id number]      OT SHORT TERM GOAL #1   Title Patient will be educated on a HEP for right shoulder ROM and strength.   Time 6   Period Weeks   Status On-going     OT SHORT TERM GOAL #2   Title Patient will improve right shoulder and elbow P/ROM to [facility] in order to complete B/ADLs with increased independence.   Time 6   Period Weeks   Status Achieved     OT SHORT TERM GOAL #3   Title Patient will improve right shoulder and elbow strength to 3+/5 for increased ability to pick up baskets of laundry.   Time 6   Period Weeks   Status On-going     OT SHORT TERM GOAL #4   Title Patient will decrease right shoulder pain to 4/10 or better in order to complete B/ADLs with greater independence.   Time 6   Period Weeks   Status On-going     OT SHORT TERM GOAL #5   Title Patient will decrease fascial restrictions to moderate in right shoulder region for greater mobility needed for greater use of right arm as dominant.    Time 6   Period Weeks   Status Achieved           OT Long Term Goals - 05/18/16 1415      OT LONG TERM GOAL #1   Title Patient will return to prior level of independence with all B/IADLs, work, and leisure activities using her right arm as dominant.    Time 12   Period Weeks   Status On-going     OT LONG TERM GOAL #2   Title Patient will improve right shoulder and elbow A/ROM to WNL for greater ability to use right arm as dominant at work and home.    Time 12   Period Weeks   Status On-going     OT LONG TERM GOAL #3   Title Patient will improve right shoulder and elbow strength to 5/5 for greater ability to lift bags of mulch when completing yardwork.     Time 12   Period Weeks   Status On-going     OT LONG TERM GOAL #4   Title Patient will decrease right shoulder pain to 2/10 or better when completing functional actiivites.    Time  12   Period Weeks   Status On-going     OT LONG TERM GOAL #5   Title Paitent will decrease  right fascial restrictions in right shoulder in order to have greater mobility needed for use of right arm with functional activiites.     Time 12   Period Weeks   Status On-going               Plan - 07/18/16 1203    Clinical Impression Statement A: Ultrasound completed at beginning of session. Pt completed A/ROM in supine, AA/ROM in sitting, and scapular theraband. Pt requires rest breaks for pain/fatigue, verbal cuing for form and technique. Pt with the most pain during shoulder flexion and abduction.    Plan P: continue with ultrasound for pain, attempt 10 repetitions for A/ROM in sitting   Consulted and Agree with Plan of Care Patient      Patient will benefit from skilled therapeutic intervention in order to improve the following deficits and impairments:  Decreased range of motion, Decreased skin integrity, Decreased scar mobility, Decreased strength, Increased muscle spasms, Increased fascial restricitons, Impaired UE functional use, Pain  Visit Diagnosis: Stiffness of right shoulder, not elsewhere classified  Other symptoms and signs involving the musculoskeletal system  Pain in right shoulder    Problem List Patient Active Problem List   Diagnosis Date Noted  . Epigastric pain 08/11/2015  . Chest pain, rule out acute myocardial infarction 08/11/2015  . Renal insufficiency 08/11/2015  . Venous stasis 04/13/2015  . Depression 04/13/2015  . Essential hypertension 09/12/2014  . PVCs (premature ventricular contractions) 09/07/2014  . Precordial pain 09/07/2014   Guadelupe Sabin, OTR/L  [phone] 07/18/2016, 12:05 PM  [facility] [address] [place], [place], [address] Phone: [phone]   Fax:  [phone]  Name: Jennifer Coleman MRN: [id number] Date of Birth: [birth year]

## 2016-07-20 ENCOUNTER — Ambulatory Visit (HOSPITAL_COMMUNITY): Payer: 59 | Admitting: Occupational Therapy

## 2016-07-20 ENCOUNTER — Encounter (HOSPITAL_COMMUNITY): Payer: Self-pay | Admitting: Occupational Therapy

## 2016-07-20 DIAGNOSIS — Z01818 Encounter for other preprocedural examination: Secondary | ICD-10-CM | POA: Diagnosis not present

## 2016-07-20 DIAGNOSIS — R29898 Other symptoms and signs involving the musculoskeletal system: Secondary | ICD-10-CM | POA: Diagnosis not present

## 2016-07-20 DIAGNOSIS — M25511 Pain in right shoulder: Secondary | ICD-10-CM | POA: Diagnosis not present

## 2016-07-20 DIAGNOSIS — M25621 Stiffness of right elbow, not elsewhere classified: Secondary | ICD-10-CM | POA: Diagnosis not present

## 2016-07-20 DIAGNOSIS — M25611 Stiffness of right shoulder, not elsewhere classified: Secondary | ICD-10-CM

## 2016-07-20 NOTE — Therapy (Signed)
[place], [place], [address] Phone: [phone]   Fax:  [phone]  Occupational Therapy Treatment  Patient Details  Name: Jennifer Coleman MRN: [id number] Date of Birth: December 20, 1950 Referring Provider: Dr. Joni Fears  Encounter Date: 07/20/2016      OT End of Session - 07/20/16 1243    Visit Number 24   Number of Visits 30   Date for OT Re-Evaluation 08/10/16   Authorization Type UMR   OT Start Time 0950   OT Stop Time 1032   OT Time Calculation (min) 42 min   Activity Tolerance Patient tolerated treatment well      Past Medical History:  Diagnosis Date  . Depression   . Family history of anesthesia complication 25 yrs ago   Sister allergic to sccinocholine - unable to come off ventilator  . Hypertension   . PONV (postoperative nausea and vomiting)   . Shingles   . Varicose veins     Past Surgical History:  Procedure Laterality Date  . CARPAL TUNNEL RELEASE    . CERVICAL DISC SURGERY  1992   Fusion  . COLONOSCOPY N/A 06/07/2016   Procedure: COLONOSCOPY;  Surgeon: Rogene Houston, MD;  [facility];  Service: Endoscopy;  Laterality: N/A;  1:00  . KNEE ARTHROSCOPY  09/16/2012   Procedure: ARTHROSCOPY KNEE;  Surgeon: [name], MD;  Location: [facility];  Service: Orthopedics;  Laterality: Right;  Right Knee Arthroscopy  . SALPINGOOPHORECTOMY Left 1985  . SHOULDER ARTHROSCOPY WITH OPEN ROTATOR CUFF REPAIR AND DISTAL CLAVICLE ACROMINECTOMY Right 05/03/2016   Procedure: RIGHT SHOULDER ARTHROSCOPY WITH MINI-OPEN ROTATOR CUFF REPAIR,DISTAL CLAVICLE RESECTION, SUBACROMIAL DECOMPRESSION.;  Surgeon: [name], MD;  Location: [facility];  Service: Orthopedics;  Laterality: Right;    There were no vitals filed for this visit.      Subjective Assessment - 07/20/16 0952    Subjective  S: I'm getting Ronalee Belts to help me in the evenings with stretching.    Currently in Pain?  Yes   Pain Score 3    Pain Location Shoulder   Pain Orientation Right   Pain Descriptors / Indicators Aching   Pain Type Acute pain   Pain Onset More than a month ago   Pain Frequency Constant   Aggravating Factors  movement and use   Pain Relieving Factors rest and ice   Effect of Pain on Daily Activities limited use of RUE during daily tasks.    Multiple Pain Sites No            [facility] OT Assessment - 07/20/16 0952      Assessment   Diagnosis S/P Right SAD, DCE, RCR and biceps tenodesis     Precautions   Precautions Shoulder   Type of Shoulder Precautions Begin AA/ROM for 4 weeks (until 07/11/16)                   OT Treatments/Exercises (OP) - 07/20/16 0954      Exercises   Exercises Shoulder     Shoulder Exercises: Supine   Protraction PROM;5 reps;AROM;10 reps   Horizontal ABduction PROM;5 reps;AROM  8 reps   External Rotation PROM;5 reps;AROM;10 reps   Internal Rotation PROM;5 reps;AROM;10 reps   Flexion PROM;5 reps;AROM  7 reps   ABduction PROM;5 reps;AROM  9 reps     Shoulder Exercises: Seated   Protraction AAROM;10 reps   Horizontal ABduction AAROM;10 reps   External Rotation  AROM;10 reps   Internal Rotation AROM;10 reps   Flexion AAROM;10 reps   Abduction AAROM;10 reps     Shoulder Exercises: Standing   Extension Theraband;10 reps   Theraband Level (Shoulder Extension) Level 2 (Red)   Row Theraband;10 reps   Theraband Level (Shoulder Row) Level 2 (Red)     Modalities   Modalities Ultrasound     Ultrasound   Ultrasound Location right anterior deltoid   Ultrasound Parameters 1.24mHz   Ultrasound Goals Pain     Manual Therapy   Manual Therapy Myofascial release   Manual therapy comments manual therapy completed seperately from all other interventions this date   Myofascial Release myofascial release and manual stretching to right upper arm, scapular, shoulder region and associated areas to decrease pain and restrictions and imrpove  pain free mobility in right shoulder.                    OT Short Term Goals - 07/11/16 [id number]      OT SHORT TERM GOAL #1   Title Patient will be educated on a HEP for right shoulder ROM and strength.   Time 6   Period Weeks   Status On-going     OT SHORT TERM GOAL #2   Title Patient will improve right shoulder and elbow P/ROM to [facility] in order to complete B/ADLs with increased independence.   Time 6   Period Weeks   Status Achieved     OT SHORT TERM GOAL #3   Title Patient will improve right shoulder and elbow strength to 3+/5 for increased ability to pick up baskets of laundry.   Time 6   Period Weeks   Status On-going     OT SHORT TERM GOAL #4   Title Patient will decrease right shoulder pain to 4/10 or better in order to complete B/ADLs with greater independence.   Time 6   Period Weeks   Status On-going     OT SHORT TERM GOAL #5   Title Patient will decrease fascial restrictions to moderate in right shoulder region for greater mobility needed for greater use of right arm as dominant.    Time 6   Period Weeks   Status Achieved           OT Long Term Goals - 05/18/16 1415      OT LONG TERM GOAL #1   Title Patient will return to prior level of independence with all B/IADLs, work, and leisure activities using her right arm as dominant.    Time 12   Period Weeks   Status On-going     OT LONG TERM GOAL #2   Title Patient will improve right shoulder and elbow A/ROM to WNL for greater ability to use right arm as dominant at work and home.    Time 12   Period Weeks   Status On-going     OT LONG TERM GOAL #3   Title Patient will improve right shoulder and elbow strength to 5/5 for greater ability to lift bags of mulch when completing yardwork.     Time 12   Period Weeks   Status On-going     OT LONG TERM GOAL #4   Title Patient will decrease right shoulder pain to 2/10 or better when completing functional actiivites.    Time 12   Period Weeks   Status  On-going     OT LONG TERM GOAL #5   Title Paitent will decrease right fascial restrictions  in right shoulder in order to have greater mobility needed for use of right arm with functional activiites.     Time 12   Period Weeks   Status On-going               Plan - 07/20/16 1244    Clinical Impression Statement A: Continued ultrasound this session, increased A/ROM in supine as able to tolerate. Pt required verbal cuing for form, unweighting of RUE during abduction. Pt continues to report painful "catching" at anterior deltoid with all movements.    Plan P: Complete 10 repetitions of A/ROM in supine and update HEP. Add shoulder stretches as able to tolerate.       Patient will benefit from skilled therapeutic intervention in order to improve the following deficits and impairments:  Decreased range of motion, Decreased skin integrity, Decreased scar mobility, Decreased strength, Increased muscle spasms, Increased fascial restricitons, Impaired UE functional use, Pain  Visit Diagnosis: Stiffness of right shoulder, not elsewhere classified  Other symptoms and signs involving the musculoskeletal system  Pain in right shoulder    Problem List Patient Active Problem List   Diagnosis Date Noted  . Epigastric pain 08/11/2015  . Chest pain, rule out acute myocardial infarction 08/11/2015  . Renal insufficiency 08/11/2015  . Venous stasis 04/13/2015  . Depression 04/13/2015  . Essential hypertension 09/12/2014  . PVCs (premature ventricular contractions) 09/07/2014  . Precordial pain 09/07/2014   Guadelupe Sabin, OTR/L  [phone] 07/20/2016, 12:46 PM  [facility] [address] [place], [place], [address] Phone: [phone]   Fax:  [phone]  Name: MAKENLIE STOEHR MRN: [id number] Date of Birth: Dec 28, 1950

## 2016-07-23 ENCOUNTER — Ambulatory Visit (HOSPITAL_COMMUNITY): Payer: 59

## 2016-07-23 ENCOUNTER — Encounter (HOSPITAL_COMMUNITY): Payer: Self-pay

## 2016-07-23 DIAGNOSIS — M25511 Pain in right shoulder: Secondary | ICD-10-CM | POA: Diagnosis not present

## 2016-07-23 DIAGNOSIS — M25621 Stiffness of right elbow, not elsewhere classified: Secondary | ICD-10-CM | POA: Diagnosis not present

## 2016-07-23 DIAGNOSIS — M25611 Stiffness of right shoulder, not elsewhere classified: Secondary | ICD-10-CM | POA: Diagnosis not present

## 2016-07-23 DIAGNOSIS — R29898 Other symptoms and signs involving the musculoskeletal system: Secondary | ICD-10-CM | POA: Diagnosis not present

## 2016-07-23 DIAGNOSIS — Z01818 Encounter for other preprocedural examination: Secondary | ICD-10-CM | POA: Diagnosis not present

## 2016-07-23 NOTE — Therapy (Signed)
[place], [place], [address] Phone: [phone]   Fax:  [phone]  Occupational Therapy Treatment  Patient Details  Name: Jennifer Coleman MRN: [id number] Date of Birth: 1951-01-06 Referring Provider: Dr. Joni Fears  Encounter Date: 07/23/2016      OT End of Session - 07/23/16 1107    Visit Number 25   Number of Visits 30   Date for OT Re-Evaluation 08/10/16   Authorization Type UMR   OT Start Time 1039   OT Stop Time 1120   OT Time Calculation (min) 41 min   Activity Tolerance Patient tolerated treatment well      Past Medical History:  Diagnosis Date  . Depression   . Family history of anesthesia complication 25 yrs ago   Sister allergic to sccinocholine - unable to come off ventilator  . Hypertension   . PONV (postoperative nausea and vomiting)   . Shingles   . Varicose veins     Past Surgical History:  Procedure Laterality Date  . CARPAL TUNNEL RELEASE    . CERVICAL DISC SURGERY  1992   Fusion  . COLONOSCOPY N/A 06/07/2016   Procedure: COLONOSCOPY;  Surgeon: Rogene Houston, MD;  [facility];  Service: Endoscopy;  Laterality: N/A;  1:00  . KNEE ARTHROSCOPY  09/16/2012   Procedure: ARTHROSCOPY KNEE;  Surgeon: [name], MD;  Location: [facility];  Service: Orthopedics;  Laterality: Right;  Right Knee Arthroscopy  . SALPINGOOPHORECTOMY Left 1985  . SHOULDER ARTHROSCOPY WITH OPEN ROTATOR CUFF REPAIR AND DISTAL CLAVICLE ACROMINECTOMY Right 05/03/2016   Procedure: RIGHT SHOULDER ARTHROSCOPY WITH MINI-OPEN ROTATOR CUFF REPAIR,DISTAL CLAVICLE RESECTION, SUBACROMIAL DECOMPRESSION.;  Surgeon: [name], MD;  Location: [facility];  Service: Orthopedics;  Laterality: Right;    There were no vitals filed for this visit.      Subjective Assessment - 07/23/16 1106    Subjective  S: I reached for the car door on Saturday and my shoulder just tightned up and I thought  I wouldn't be able to leave the house.    Currently in Pain? Yes   Pain Score 4    Pain Location Shoulder   Pain Orientation Right                      OT Treatments/Exercises (OP) - 07/23/16 1103      Exercises   Exercises Shoulder     Shoulder Exercises: Supine   Protraction PROM;5 reps;AROM;10 reps   Horizontal ABduction PROM;5 reps;AROM;10 reps   External Rotation PROM;5 reps;AROM;10 reps   Internal Rotation PROM;5 reps;AROM;10 reps   Flexion PROM;5 reps   ABduction PROM;5 reps     Shoulder Exercises: Seated   Protraction AAROM;10 reps   External Rotation AROM;10 reps   Internal Rotation AROM;10 reps   Flexion AAROM;10 reps   Abduction AAROM;10 reps     Shoulder Exercises: Standing   Extension Theraband;10 reps   Theraband Level (Shoulder Extension) Level 2 (Red)   Row Theraband;10 reps   Theraband Level (Shoulder Row) Level 2 (Red)     Shoulder Exercises: ROM/Strengthening   Wall Wash 1'     Modalities   Modalities Ultrasound     Ultrasound   Ultrasound Location right anterior deltoid   Ultrasound Parameters 1.5 mHz   Ultrasound Goals Pain     Manual Therapy   Manual Therapy Myofascial release   Manual therapy comments manual therapy completed seperately  from all other interventions this date   Myofascial Release myofascial release and manual stretching to right upper arm, scapular, shoulder region and associated areas to decrease pain and restrictions and imrpove pain free mobility in right shoulder.                  OT Education - 07/23/16 1242    Education provided Yes   Education Details red scapular theraband exercises   Person(s) Educated Patient   Methods Explanation;Demonstration;Verbal cues;Handout   Comprehension Returned demonstration;Verbalized understanding          OT Short Term Goals - 07/11/16 0938      OT SHORT TERM GOAL #1   Title Patient will be educated on a HEP for right shoulder ROM and strength.    Time 6   Period Weeks   Status On-going     OT SHORT TERM GOAL #2   Title Patient will improve right shoulder and elbow P/ROM to [facility] in order to complete B/ADLs with increased independence.   Time 6   Period Weeks   Status Achieved     OT SHORT TERM GOAL #3   Title Patient will improve right shoulder and elbow strength to 3+/5 for increased ability to pick up baskets of laundry.   Time 6   Period Weeks   Status On-going     OT SHORT TERM GOAL #4   Title Patient will decrease right shoulder pain to 4/10 or better in order to complete B/ADLs with greater independence.   Time 6   Period Weeks   Status On-going     OT SHORT TERM GOAL #5   Title Patient will decrease fascial restrictions to moderate in right shoulder region for greater mobility needed for greater use of right arm as dominant.    Time 6   Period Weeks   Status Achieved           OT Long Term Goals - 05/18/16 1415      OT LONG TERM GOAL #1   Title Patient will return to prior level of independence with all B/IADLs, work, and leisure activities using her right arm as dominant.    Time 12   Period Weeks   Status On-going     OT LONG TERM GOAL #2   Title Patient will improve right shoulder and elbow A/ROM to WNL for greater ability to use right arm as dominant at work and home.    Time 12   Period Weeks   Status On-going     OT LONG TERM GOAL #3   Title Patient will improve right shoulder and elbow strength to 5/5 for greater ability to lift bags of mulch when completing yardwork.     Time 12   Period Weeks   Status On-going     OT LONG TERM GOAL #4   Title Patient will decrease right shoulder pain to 2/10 or better when completing functional actiivites.    Time 12   Period Weeks   Status On-going     OT LONG TERM GOAL #5   Title Paitent will decrease right fascial restrictions in right shoulder in order to have greater mobility needed for use of right arm with functional activiites.     Time 12    Period Weeks   Status On-going               Plan - 07/23/16 1235    Clinical Impression Statement A: [place] continued this session. Pt reports  that she reached for the car door (abduction) on the weekend and when doing so her arm became very tight. Pt had increased difficulty with certain shoulder movements. Unable to complete supine shoulder flexion or seated horizontal abduction.  Updated HEP to include scapular theraband.    Plan P: Follow up on shoulder pain and update HEP if able to (A/ROM). Add shoulder stretches if able to tolerate.       Patient will benefit from skilled therapeutic intervention in order to improve the following deficits and impairments:  Decreased range of motion, Decreased skin integrity, Decreased scar mobility, Decreased strength, Increased muscle spasms, Increased fascial restricitons, Impaired UE functional use, Pain  Visit Diagnosis: Stiffness of right shoulder, not elsewhere classified  Other symptoms and signs involving the musculoskeletal system  Pain in right shoulder    Problem List Patient Active Problem List   Diagnosis Date Noted  . Epigastric pain 08/11/2015  . Chest pain, rule out acute myocardial infarction 08/11/2015  . Renal insufficiency 08/11/2015  . Venous stasis 04/13/2015  . Depression 04/13/2015  . Essential hypertension 09/12/2014  . PVCs (premature ventricular contractions) 09/07/2014  . Precordial pain 09/07/2014   Ailene Ravel, OTR/L,CBIS  [phone]  07/23/2016, 12:44 PM  [facility] [address] [place], [place], [address] Phone: [phone]   Fax:  [phone]  Name: YAN DOTTS MRN: [id number] Date of Birth: 10-01-1951

## 2016-07-23 NOTE — Patient Instructions (Signed)

## 2016-07-25 ENCOUNTER — Ambulatory Visit (HOSPITAL_COMMUNITY): Payer: 59 | Admitting: Occupational Therapy

## 2016-07-25 ENCOUNTER — Encounter (HOSPITAL_COMMUNITY): Payer: Self-pay | Admitting: Occupational Therapy

## 2016-07-25 DIAGNOSIS — M25611 Stiffness of right shoulder, not elsewhere classified: Secondary | ICD-10-CM | POA: Diagnosis not present

## 2016-07-25 DIAGNOSIS — M25511 Pain in right shoulder: Secondary | ICD-10-CM | POA: Diagnosis not present

## 2016-07-25 DIAGNOSIS — Z01818 Encounter for other preprocedural examination: Secondary | ICD-10-CM | POA: Diagnosis not present

## 2016-07-25 DIAGNOSIS — M25621 Stiffness of right elbow, not elsewhere classified: Secondary | ICD-10-CM | POA: Diagnosis not present

## 2016-07-25 DIAGNOSIS — R29898 Other symptoms and signs involving the musculoskeletal system: Secondary | ICD-10-CM

## 2016-07-25 NOTE — Patient Instructions (Signed)
  1) Flexion Wall Stretch    Face wall, place affected handon wall in front of you. Slide hand up the wall  and lean body in towards the wall. Hold for 5-10 seconds. Repeat 3 times. 1-2 times/day.     2) Towel Stretch with Internal Rotation   Gently pull up your affected arm  behind your back with the assist of a towel. Complete 3x, 5-10 seconds, 1-2x/day.            3) Corner Stretch    Stand at a corner of a wall, place your arms on the walls with elbows bent. Lean into the corner until a stretch is felt along the front of your chest and/or shoulders. Hold for 5-10 seconds. Repeat 3X, 1-2 times/day.    4) Posterior Capsule Stretch    Bring the involved arm across chest. Grasp elbow and pull toward chest until you feel a stretch in the back of the upper arm and shoulder. Hold 5-10 seconds. Repeat 3X. Complete 1-2 times/day.    5) Scapular Retraction    Tuck chin back as you pinch shoulder blades together.  Hold 5-10 seconds. Repeat 3X. Complete 1-2 times/day.    6) External Rotation Stretch:     Place your affected hand on the wall with the elbow bent and gently turn your body the opposite direction until a stretch is felt.  Complete 3x, hold 5-10 seconds each time.

## 2016-07-25 NOTE — Therapy (Signed)
[place], [place], [address] Phone: [phone]   Fax:  [phone]  Occupational Therapy Treatment  Patient Details  Name: Jennifer Coleman MRN: [id number] Date of Birth: 11-16-1950 Referring Provider: Dr. Joni Fears  Encounter Date: 07/25/2016      OT End of Session - 07/25/16 1201    Visit Number 26   Number of Visits 30   Date for OT Re-Evaluation 08/10/16   Authorization Type UMR   OT Start Time 0907   OT Stop Time 0947   OT Time Calculation (min) 40 min   Activity Tolerance Patient tolerated treatment well      Past Medical History:  Diagnosis Date  . Depression   . Family history of anesthesia complication 25 yrs ago   Sister allergic to sccinocholine - unable to come off ventilator  . Hypertension   . PONV (postoperative nausea and vomiting)   . Shingles   . Varicose veins     Past Surgical History:  Procedure Laterality Date  . CARPAL TUNNEL RELEASE    . CERVICAL DISC SURGERY  1992   Fusion  . COLONOSCOPY N/A 06/07/2016   Procedure: COLONOSCOPY;  Surgeon: Rogene Houston, MD;  [facility];  Service: Endoscopy;  Laterality: N/A;  1:00  . KNEE ARTHROSCOPY  09/16/2012   Procedure: ARTHROSCOPY KNEE;  Surgeon: [name], MD;  Location: [facility];  Service: Orthopedics;  Laterality: Right;  Right Knee Arthroscopy  . SALPINGOOPHORECTOMY Left 1985  . SHOULDER ARTHROSCOPY WITH OPEN ROTATOR CUFF REPAIR AND DISTAL CLAVICLE ACROMINECTOMY Right 05/03/2016   Procedure: RIGHT SHOULDER ARTHROSCOPY WITH MINI-OPEN ROTATOR CUFF REPAIR,DISTAL CLAVICLE RESECTION, SUBACROMIAL DECOMPRESSION.;  Surgeon: [name], MD;  Location: [facility];  Service: Orthopedics;  Laterality: Right;    There were no vitals filed for this visit.      Subjective Assessment - 07/25/16 0908    Subjective  S: I've been using ibuprofen and heat.    Currently in Pain? Yes   Pain Score 4     Pain Location Shoulder   Pain Orientation Right   Pain Descriptors / Indicators Aching   Pain Type Acute pain   Pain Radiating Towards neck to bicep   Pain Onset More than a month ago   Pain Frequency Constant   Aggravating Factors  movement and use, abduction   Pain Relieving Factors rest, heat   Effect of Pain on Daily Activities limited use of RUE during daily tasks.             [facility] OT Assessment - 07/25/16 0907      Assessment   Diagnosis S/P Right SAD, DCE, RCR and biceps tenodesis     Precautions   Precautions Shoulder   Type of Shoulder Precautions Begin AA/ROM for 4 weeks (until 07/11/16)                   OT Treatments/Exercises (OP) - 07/25/16 0909      Exercises   Exercises Shoulder     Shoulder Exercises: Supine   Protraction PROM;5 reps;AROM;10 reps   Horizontal ABduction PROM;5 reps;AROM;10 reps   External Rotation PROM;5 reps;AROM;10 reps   Internal Rotation PROM;5 reps;AROM;10 reps   Flexion PROM;5 reps   ABduction PROM;5 reps     Shoulder Exercises: Stretch   Corner Stretch 3 reps;10 seconds   Cross Chest Stretch 3 reps;10 seconds   Internal Rotation Stretch 3 reps  10 seconds  External Rotation Stretch 3 reps;10 seconds   Wall Stretch - Flexion 3 reps;10 seconds     Modalities   Modalities Ultrasound     Ultrasound   Ultrasound Location right anterior deltoid   Ultrasound Parameters 1.5 mHz   Ultrasound Goals Pain     Manual Therapy   Manual Therapy Myofascial release   Manual therapy comments manual therapy completed seperately from all other interventions this date   Myofascial Release myofascial release and manual stretching to right upper arm, scapular, shoulder region and associated areas to decrease pain and restrictions and imrpove pain free mobility in right shoulder.                  OT Education - 07/25/16 1201    Education provided Yes   Education Details shoulder stretches   Methods  Explanation;Demonstration;Handout;Verbal cues   Comprehension Verbalized understanding;Returned demonstration          OT Short Term Goals - 07/11/16 0938      OT SHORT TERM GOAL #1   Title Patient will be educated on a HEP for right shoulder ROM and strength.   Time 6   Period Weeks   Status On-going     OT SHORT TERM GOAL #2   Title Patient will improve right shoulder and elbow P/ROM to [facility] in order to complete B/ADLs with increased independence.   Time 6   Period Weeks   Status Achieved     OT SHORT TERM GOAL #3   Title Patient will improve right shoulder and elbow strength to 3+/5 for increased ability to pick up baskets of laundry.   Time 6   Period Weeks   Status On-going     OT SHORT TERM GOAL #4   Title Patient will decrease right shoulder pain to 4/10 or better in order to complete B/ADLs with greater independence.   Time 6   Period Weeks   Status On-going     OT SHORT TERM GOAL #5   Title Patient will decrease fascial restrictions to moderate in right shoulder region for greater mobility needed for greater use of right arm as dominant.    Time 6   Period Weeks   Status Achieved           OT Long Term Goals - 05/18/16 1415      OT LONG TERM GOAL #1   Title Patient will return to prior level of independence with all B/IADLs, work, and leisure activities using her right arm as dominant.    Time 12   Period Weeks   Status On-going     OT LONG TERM GOAL #2   Title Patient will improve right shoulder and elbow A/ROM to WNL for greater ability to use right arm as dominant at work and home.    Time 12   Period Weeks   Status On-going     OT LONG TERM GOAL #3   Title Patient will improve right shoulder and elbow strength to 5/5 for greater ability to lift bags of mulch when completing yardwork.     Time 12   Period Weeks   Status On-going     OT LONG TERM GOAL #4   Title Patient will decrease right shoulder pain to 2/10 or better when completing  functional actiivites.    Time 12   Period Weeks   Status On-going     OT LONG TERM GOAL #5   Title Paitent will decrease right fascial restrictions in right  shoulder in order to have greater mobility needed for use of right arm with functional activiites.     Time 12   Period Weeks   Status On-going               Plan - 07/25/16 1202    Clinical Impression Statement A: [place] continued this session. Pt reports continued pain in right upper arm and anterior deltoid with initiation of flexion and abduction. Added shoulder stretches this session, pt able to complete with min difficulty, provided HEP. Pt reports scapular theraband HEP is going well.    Plan P: Follow up on shoulder pain, updated HEP for A/ROM if pt able to tolerate.    OT Home Exercise Plan 9/27: shoulder stretches      Patient will benefit from skilled therapeutic intervention in order to improve the following deficits and impairments:  Decreased range of motion, Decreased skin integrity, Decreased scar mobility, Decreased strength, Increased muscle spasms, Increased fascial restricitons, Impaired UE functional use, Pain  Visit Diagnosis: Stiffness of right shoulder, not elsewhere classified  Other symptoms and signs involving the musculoskeletal system  Pain in right shoulder    Problem List Patient Active Problem List   Diagnosis Date Noted  . Epigastric pain 08/11/2015  . Chest pain, rule out acute myocardial infarction 08/11/2015  . Renal insufficiency 08/11/2015  . Venous stasis 04/13/2015  . Depression 04/13/2015  . Essential hypertension 09/12/2014  . PVCs (premature ventricular contractions) 09/07/2014  . Precordial pain 09/07/2014   Guadelupe Sabin, OTR/L  [phone] 07/25/2016, 12:04 PM  [place] [address] [place], [place], [address] Phone: [phone]   Fax:  [phone]  Name: Jennifer Coleman MRN: [id number] Date of Birth: 07/29/1951

## 2016-07-26 ENCOUNTER — Ambulatory Visit (HOSPITAL_COMMUNITY)
Admission: RE | Admit: 2016-07-26 | Discharge: 2016-07-26 | Disposition: A | Discharge status: Home / Self Care | Payer: 59 | Source: Ambulatory Visit | Attending: Orthopaedic Surgery | Admitting: Orthopaedic Surgery

## 2016-07-26 DIAGNOSIS — Z780 Asymptomatic menopausal state: Secondary | ICD-10-CM | POA: Insufficient documentation

## 2016-07-26 DIAGNOSIS — M85852 Other specified disorders of bone density and structure, left thigh: Secondary | ICD-10-CM | POA: Diagnosis not present

## 2016-07-26 DIAGNOSIS — M810 Age-related osteoporosis without current pathological fracture: Secondary | ICD-10-CM | POA: Diagnosis not present

## 2016-07-26 DIAGNOSIS — M85862 Other specified disorders of bone density and structure, left lower leg: Secondary | ICD-10-CM | POA: Diagnosis not present

## 2016-07-31 ENCOUNTER — Encounter (HOSPITAL_COMMUNITY): Payer: Self-pay

## 2016-07-31 ENCOUNTER — Ambulatory Visit (HOSPITAL_COMMUNITY): Payer: 59 | Attending: Orthopaedic Surgery

## 2016-07-31 DIAGNOSIS — Z01818 Encounter for other preprocedural examination: Secondary | ICD-10-CM | POA: Diagnosis not present

## 2016-07-31 DIAGNOSIS — R29898 Other symptoms and signs involving the musculoskeletal system: Secondary | ICD-10-CM | POA: Diagnosis not present

## 2016-07-31 DIAGNOSIS — M25611 Stiffness of right shoulder, not elsewhere classified: Secondary | ICD-10-CM | POA: Diagnosis not present

## 2016-07-31 DIAGNOSIS — M25511 Pain in right shoulder: Secondary | ICD-10-CM | POA: Diagnosis not present

## 2016-07-31 NOTE — Therapy (Signed)
[place], [place], [address] Phone: [phone]   Fax:  [phone]  Occupational Therapy Treatment  Patient Details  Name: Jennifer Coleman MRN: [id number] Date of Birth: March 30, 1951 Referring Provider: Dr. Joni Fears  Encounter Date: 07/31/2016      OT End of Session - 07/31/16 1233    Visit Number 27   Number of Visits 30   Date for OT Re-Evaluation 08/10/16   Authorization Type UMR   OT Start Time 1120   OT Stop Time 1202   OT Time Calculation (min) 42 min   Activity Tolerance Patient tolerated treatment well      Past Medical History:  Diagnosis Date  . Depression   . Family history of anesthesia complication 25 yrs ago   Sister allergic to sccinocholine - unable to come off ventilator  . Hypertension   . PONV (postoperative nausea and vomiting)   . Shingles   . Varicose veins     Past Surgical History:  Procedure Laterality Date  . CARPAL TUNNEL RELEASE    . CERVICAL DISC SURGERY  1992   Fusion  . COLONOSCOPY N/A 06/07/2016   Procedure: COLONOSCOPY;  Surgeon: Rogene Houston, MD;  [facility];  Service: Endoscopy;  Laterality: N/A;  1:00  . KNEE ARTHROSCOPY  09/16/2012   Procedure: ARTHROSCOPY KNEE;  Surgeon: [name], MD;  Location: [facility];  Service: Orthopedics;  Laterality: Right;  Right Knee Arthroscopy  . SALPINGOOPHORECTOMY Left 1985  . SHOULDER ARTHROSCOPY WITH OPEN ROTATOR CUFF REPAIR AND DISTAL CLAVICLE ACROMINECTOMY Right 05/03/2016   Procedure: RIGHT SHOULDER ARTHROSCOPY WITH MINI-OPEN ROTATOR CUFF REPAIR,DISTAL CLAVICLE RESECTION, SUBACROMIAL DECOMPRESSION.;  Surgeon: [name], MD;  Location: [facility];  Service: Orthopedics;  Laterality: Right;    There were no vitals filed for this visit.      Subjective Assessment - 07/31/16 1232    Subjective  S: It just pops and cracks all the time. Sometimes I'll have pain without warning.    Currently in Pain? Yes   Pain Score 4    Pain Location Shoulder   Pain Orientation Right   Pain Descriptors / Indicators Aching   Pain Type Acute pain                      OT Treatments/Exercises (OP) - 07/31/16 1143      Exercises   Exercises Shoulder     Shoulder Exercises: Prone   Retraction AROM;10 reps   Flexion AROM;10 reps   Extension AROM;10 reps   Horizontal ABduction 1 AROM;10 reps     Shoulder Exercises: Sidelying   External Rotation AROM;10 reps   Internal Rotation AROM;10 reps   ABduction AROM;5 reps   Other Sidelying Exercises Protaction; 10X, A/ROM     Shoulder Exercises: Therapy Ball   Right/Left 5 reps     Shoulder Exercises: ROM/Strengthening   Wall Wash 1'     Manual Therapy   Manual Therapy Myofascial release;Soft tissue mobilization   Manual therapy comments manual therapy completed seperately from all other interventions this date   Soft tissue mobilization Trigger point and postitional release completed to reduce trigger point pain and relax deltoid muscle.     Myofascial Release myofascial release and manual stretching to right upper arm, scapular, shoulder region and associated areas to decrease pain and restrictions and imrpove pain free mobility in right shoulder.  OT Short Term Goals - 07/11/16 [id number]      OT SHORT TERM GOAL #1   Title Patient will be educated on a HEP for right shoulder ROM and strength.   Time 6   Period Weeks   Status On-going     OT SHORT TERM GOAL #2   Title Patient will improve right shoulder and elbow P/ROM to [facility] in order to complete B/ADLs with increased independence.   Time 6   Period Weeks   Status Achieved     OT SHORT TERM GOAL #3   Title Patient will improve right shoulder and elbow strength to 3+/5 for increased ability to pick up baskets of laundry.   Time 6   Period Weeks   Status On-going     OT SHORT TERM GOAL #4   Title Patient will decrease right  shoulder pain to 4/10 or better in order to complete B/ADLs with greater independence.   Time 6   Period Weeks   Status On-going     OT SHORT TERM GOAL #5   Title Patient will decrease fascial restrictions to moderate in right shoulder region for greater mobility needed for greater use of right arm as dominant.    Time 6   Period Weeks   Status Achieved           OT Long Term Goals - 05/18/16 1415      OT LONG TERM GOAL #1   Title Patient will return to prior level of independence with all B/IADLs, work, and leisure activities using her right arm as dominant.    Time 12   Period Weeks   Status On-going     OT LONG TERM GOAL #2   Title Patient will improve right shoulder and elbow A/ROM to WNL for greater ability to use right arm as dominant at work and home.    Time 12   Period Weeks   Status On-going     OT LONG TERM GOAL #3   Title Patient will improve right shoulder and elbow strength to 5/5 for greater ability to lift bags of mulch when completing yardwork.     Time 12   Period Weeks   Status On-going     OT LONG TERM GOAL #4   Title Patient will decrease right shoulder pain to 2/10 or better when completing functional actiivites.    Time 12   Period Weeks   Status On-going     OT LONG TERM GOAL #5   Title Paitent will decrease right fascial restrictions in right shoulder in order to have greater mobility needed for use of right arm with functional activiites.     Time 12   Period Weeks   Status On-going               Plan - 07/31/16 1234    Clinical Impression Statement A: Patient with continued complaints of pain, discomfort, and popping sensation in her right arm. Session focused on decreasing pain level in right shoulder. Added prone and sidelying exercises with verbal cues for form and technique.    Plan P: Update HEP to A/ROM when able to tolerate.       Patient will benefit from skilled therapeutic intervention in order to improve the  following deficits and impairments:  Decreased range of motion, Decreased skin integrity, Decreased scar mobility, Decreased strength, Increased muscle spasms, Increased fascial restricitons, Impaired UE functional use, Pain  Visit Diagnosis: Stiffness of right shoulder, not elsewhere classified  Other symptoms and signs involving the musculoskeletal system  Right shoulder pain, unspecified chronicity    Problem List Patient Active Problem List   Diagnosis Date Noted  . Epigastric pain 08/11/2015  . Chest pain, rule out acute myocardial infarction 08/11/2015  . Renal insufficiency 08/11/2015  . Venous stasis 04/13/2015  . Depression 04/13/2015  . Essential hypertension 09/12/2014  . PVCs (premature ventricular contractions) 09/07/2014  . Precordial pain 09/07/2014   Ailene Ravel, OTR/L,CBIS  [phone]  07/31/2016, 12:45 PM  [place] [address] [place], [place], [address] Phone: [phone]   Fax:  [phone]  Name: Jennifer Coleman MRN: [id number] Date of Birth: 1951-07-19

## 2016-08-01 ENCOUNTER — Encounter (HOSPITAL_COMMUNITY): Payer: 59

## 2016-08-01 DIAGNOSIS — L570 Actinic keratosis: Secondary | ICD-10-CM | POA: Diagnosis not present

## 2016-08-03 ENCOUNTER — Ambulatory Visit (HOSPITAL_COMMUNITY): Payer: 59 | Admitting: Specialist

## 2016-08-03 DIAGNOSIS — R29898 Other symptoms and signs involving the musculoskeletal system: Secondary | ICD-10-CM

## 2016-08-03 DIAGNOSIS — M25511 Pain in right shoulder: Secondary | ICD-10-CM

## 2016-08-03 DIAGNOSIS — M25611 Stiffness of right shoulder, not elsewhere classified: Secondary | ICD-10-CM | POA: Diagnosis not present

## 2016-08-03 DIAGNOSIS — Z01818 Encounter for other preprocedural examination: Secondary | ICD-10-CM | POA: Diagnosis not present

## 2016-08-03 NOTE — Therapy (Signed)
[place], [place], [address] Phone: [phone]   Fax:  [phone]  Occupational Therapy Treatment  Patient Details  Name: Jennifer Coleman MRN: [id number] Date of Birth: [birth year] Referring Provider: Dr. Joni Fears  Encounter Date: 08/03/2016      OT End of Session - 08/03/16 1122    Visit Number 28   Number of Visits 30   Date for OT Re-Evaluation 08/10/16   Authorization Type UMR   OT Start Time 1042   OT Stop Time 1122   OT Time Calculation (min) 40 min   Activity Tolerance Patient tolerated treatment well   Behavior During Therapy [facility] for tasks assessed/performed      Past Medical History:  Diagnosis Date  . Depression   . Family history of anesthesia complication 25 yrs ago   Sister allergic to sccinocholine - unable to come off ventilator  . Hypertension   . PONV (postoperative nausea and vomiting)   . Shingles   . Varicose veins     Past Surgical History:  Procedure Laterality Date  . CARPAL TUNNEL RELEASE    . CERVICAL DISC SURGERY  1992   Fusion  . COLONOSCOPY N/A 06/07/2016   Procedure: COLONOSCOPY;  Surgeon: Rogene Houston, MD;  [facility];  Service: Endoscopy;  Laterality: N/A;  1:00  . KNEE ARTHROSCOPY  09/16/2012   Procedure: ARTHROSCOPY KNEE;  Surgeon: [name], MD;  Location: [place];  Service: Orthopedics;  Laterality: Right;  Right Knee Arthroscopy  . SALPINGOOPHORECTOMY Left 1985  . SHOULDER ARTHROSCOPY WITH OPEN ROTATOR CUFF REPAIR AND DISTAL CLAVICLE ACROMINECTOMY Right 05/03/2016   Procedure: RIGHT SHOULDER ARTHROSCOPY WITH MINI-OPEN ROTATOR CUFF REPAIR,DISTAL CLAVICLE RESECTION, SUBACROMIAL DECOMPRESSION.;  Surgeon: [name], MD;  Location: [place];  Service: Orthopedics;  Laterality: Right;    There were no vitals filed for this visit.      Subjective Assessment - 08/03/16 1120    Subjective  S:  It just catches sometimes  when Im doing something simple like washing dishes.    Currently in Pain? Yes   Pain Score 4    Pain Location Shoulder   Pain Orientation Right            [facility] OT Assessment - 08/03/16 0001      Assessment   Diagnosis S/P Right SAD, DCE, RCR and biceps tenodesis     Precautions   Precautions Shoulder   Type of Shoulder Precautions Begin AA/ROM for 4 weeks (until 07/11/16)                   OT Treatments/Exercises (OP) - 08/03/16 0001      Exercises   Exercises Shoulder     Shoulder Exercises: Supine   Protraction PROM;5 reps;AROM;10 reps   Horizontal ABduction PROM;5 reps;AROM;10 reps   External Rotation PROM;5 reps;AROM;10 reps   Internal Rotation PROM;5 reps;AROM;10 reps   Flexion PROM;5 reps;AROM   Flexion Limitations pushing against OT for closed chain effect   ABduction PROM;AROM;5 reps   ABduction Limitations elbow at 90 abducted to 90     Shoulder Exercises: Seated   Protraction AAROM;10 reps   Protraction Limitations unweighting by therapist    External Rotation AROM;10 reps   Internal Rotation AROM;10 reps   Abduction AROM;5 reps   ABduction Limitations elbow at 90 abduct to 70   Other Seated Exercises retract and hold for 10 seconds X 5 repetitions  Shoulder Exercises: ROM/Strengthening   Other ROM/Strengthening Exercises graduated pinch tree placed red and yellow clothespins from bucket to horizontal and then horizontal to vertical pole with moderate diffiuclty and verbal guidance and tactile cues for proper reaching technique and posture  also placed all green from bucket to vertical pole.  less vg and tactile cues required this date   Other ROM/Strengthening Exercises standing at wall, rolled green therapy ball up wall to fullflexion and then rolled down 10 times, min-mod difficulty     Manual Therapy   Manual Therapy Myofascial release   Manual therapy comments manual therapy completed seperately from all other interventions this date    Myofascial Release myofascial release and manual stretching to right upper arm, scapular, shoulder region and associated areas to decrease pain and restrictions and imrpove pain free mobility in right shoulder.                    OT Short Term Goals - 07/11/16 [id number]      OT SHORT TERM GOAL #1   Title Patient will be educated on a HEP for right shoulder ROM and strength.   Time 6   Period Weeks   Status On-going     OT SHORT TERM GOAL #2   Title Patient will improve right shoulder and elbow P/ROM to [facility] in order to complete B/ADLs with increased independence.   Time 6   Period Weeks   Status Achieved     OT SHORT TERM GOAL #3   Title Patient will improve right shoulder and elbow strength to 3+/5 for increased ability to pick up baskets of laundry.   Time 6   Period Weeks   Status On-going     OT SHORT TERM GOAL #4   Title Patient will decrease right shoulder pain to 4/10 or better in order to complete B/ADLs with greater independence.   Time 6   Period Weeks   Status On-going     OT SHORT TERM GOAL #5   Title Patient will decrease fascial restrictions to moderate in right shoulder region for greater mobility needed for greater use of right arm as dominant.    Time 6   Period Weeks   Status Achieved           OT Long Term Goals - 05/18/16 1415      OT LONG TERM GOAL #1   Title Patient will return to prior level of independence with all B/IADLs, work, and leisure activities using her right arm as dominant.    Time 12   Period Weeks   Status On-going     OT LONG TERM GOAL #2   Title Patient will improve right shoulder and elbow A/ROM to WNL for greater ability to use right arm as dominant at work and home.    Time 12   Period Weeks   Status On-going     OT LONG TERM GOAL #3   Title Patient will improve right shoulder and elbow strength to 5/5 for greater ability to lift bags of mulch when completing yardwork.     Time 12   Period Weeks   Status On-going      OT LONG TERM GOAL #4   Title Patient will decrease right shoulder pain to 2/10 or better when completing functional actiivites.    Time 12   Period Weeks   Status On-going     OT LONG TERM GOAL #5   Title Paitent will decrease right fascial restrictions  in right shoulder in order to have greater mobility needed for use of right arm with functional activiites.     Time 12   Period Weeks   Status On-going               Plan - 08/03/16 1122    Clinical Impression Statement :  Patient with improved ability to complete A/ROM in supine.  Added functional reaching activity with rolling therapy ball up and down wall while in standing.  Completed graduated pinch tree with less facilitation and improved ability to reach this date.    Plan P:  Improve ability to flex and abduct in seated with less facilitation/compensation in order to improve functional reaching ability.       Patient will benefit from skilled therapeutic intervention in order to improve the following deficits and impairments:  Decreased range of motion, Decreased skin integrity, Decreased scar mobility, Decreased strength, Increased muscle spasms, Increased fascial restricitons, Impaired UE functional use, Pain  Visit Diagnosis: Stiffness of right shoulder, not elsewhere classified  Other symptoms and signs involving the musculoskeletal system  Right shoulder pain, unspecified chronicity    Problem List Patient Active Problem List   Diagnosis Date Noted  . Epigastric pain 08/11/2015  . Chest pain, rule out acute myocardial infarction 08/11/2015  . Renal insufficiency 08/11/2015  . Venous stasis 04/13/2015  . Depression 04/13/2015  . Essential hypertension 09/12/2014  . PVCs (premature ventricular contractions) 09/07/2014  . Precordial pain 09/07/2014    Vangie Bicker, [place], OTR/L [phone]  08/03/2016, 11:24 AM  [place], [place], [address] Phone: [phone]   Fax:  [phone]  Name: Jennifer Coleman MRN: [id number] Date of Birth: 1951-09-14

## 2016-08-06 ENCOUNTER — Ambulatory Visit (HOSPITAL_COMMUNITY): Payer: 59 | Admitting: Specialist

## 2016-08-06 DIAGNOSIS — M25511 Pain in right shoulder: Secondary | ICD-10-CM | POA: Diagnosis not present

## 2016-08-06 DIAGNOSIS — M25611 Stiffness of right shoulder, not elsewhere classified: Secondary | ICD-10-CM

## 2016-08-06 DIAGNOSIS — R29898 Other symptoms and signs involving the musculoskeletal system: Secondary | ICD-10-CM | POA: Diagnosis not present

## 2016-08-06 DIAGNOSIS — Z01818 Encounter for other preprocedural examination: Secondary | ICD-10-CM | POA: Diagnosis not present

## 2016-08-06 NOTE — Therapy (Signed)
[place], [place], [address] Phone: [phone]   Fax:  [phone]  Occupational Therapy Treatment  Patient Details  Name: Jennifer Coleman MRN: [id number] Date of Birth: [birth year] Referring Provider: Dr. Joni Fears  Encounter Date: 08/06/2016      OT End of Session - 08/06/16 1456    Visit Number 29   Number of Visits 30   Date for OT Re-Evaluation 08/10/16   Authorization Type UMR   OT Start Time 1345   OT Stop Time 1432   OT Time Calculation (min) 47 min      Past Medical History:  Diagnosis Date  . Depression   . Family history of anesthesia complication 25 yrs ago   Sister allergic to sccinocholine - unable to come off ventilator  . Hypertension   . PONV (postoperative nausea and vomiting)   . Shingles   . Varicose veins     Past Surgical History:  Procedure Laterality Date  . CARPAL TUNNEL RELEASE    . CERVICAL DISC SURGERY  1992   Fusion  . COLONOSCOPY N/A 06/07/2016   Procedure: COLONOSCOPY;  Surgeon: Rogene Houston, MD;  [facility];  Service: Endoscopy;  Laterality: N/A;  1:00  . KNEE ARTHROSCOPY  09/16/2012   Procedure: ARTHROSCOPY KNEE;  Surgeon: [name], MD;  Location: [facility];  Service: Orthopedics;  Laterality: Right;  Right Knee Arthroscopy  . SALPINGOOPHORECTOMY Left 1985  . SHOULDER ARTHROSCOPY WITH OPEN ROTATOR CUFF REPAIR AND DISTAL CLAVICLE ACROMINECTOMY Right 05/03/2016   Procedure: RIGHT SHOULDER ARTHROSCOPY WITH MINI-OPEN ROTATOR CUFF REPAIR,DISTAL CLAVICLE RESECTION, SUBACROMIAL DECOMPRESSION.;  Surgeon: [name], MD;  Location: [facility];  Service: Orthopedics;  Laterality: Right;    There were no vitals filed for this visit.      Subjective Assessment - 08/06/16 1452    Subjective  S:  I work it all the time, I just dont know why its not feeling any better.    Currently in Pain? Yes   Pain Score 5    Pain Location  Shoulder            [facility] OT Assessment - 08/06/16 0001      Assessment   Diagnosis S/P Right SAD, DCE, RCR and biceps tenodesis     Precautions   Precautions Shoulder   Type of Shoulder Precautions Begin AA/ROM for 4 weeks (until 07/11/16)                   OT Treatments/Exercises (OP) - 08/06/16 0001      Exercises   Exercises Shoulder     Shoulder Exercises: Supine   Protraction PROM;5 reps;AROM;10 reps   Horizontal ABduction PROM;5 reps;AROM;10 reps   External Rotation PROM;5 reps;AROM;10 reps   Internal Rotation PROM;5 reps;AROM;10 reps   Flexion PROM;5 reps;AROM   Flexion Limitations did not need assistance from OT this date    ABduction PROM;AROM;5 reps   ABduction Limitations elbow at 90 abducted to 90     Shoulder Exercises: Seated   Protraction AAROM;10 reps   Protraction Limitations unweighting by therapist    External Rotation AROM;10 reps   Internal Rotation AROM;10 reps   Flexion AROM;10 reps   Flexion Limitations to 90   Abduction AROM;10 reps   ABduction Limitations elbow at 90 abduct to 70     Shoulder Exercises: Sidelying   External Rotation AROM;10 reps   Internal Rotation AROM;10 reps  Flexion AROM;10 reps   ABduction AROM;10 reps   Other Sidelying Exercises protraction and horizontal abduction 10 times      Shoulder Exercises: ROM/Strengthening   Proximal Shoulder Strengthening, Seated 10 times each with therapist unweighting arm    Other ROM/Strengthening Exercises box and block activity moved 25 blocks from right to left and then left to right with right arm.  patient had moderate difficulty with this movement and activity      Manual Therapy   Manual Therapy Myofascial release   Manual therapy comments manual therapy completed seperately from all other interventions this date   Myofascial Release myofascial release and manual stretching to right upper arm, scapular, shoulder region and associated areas to decrease pain and  restrictions and imrpove pain free mobility in right shoulder.                    OT Short Term Goals - 07/11/16 [id number]      OT SHORT TERM GOAL #1   Title Patient will be educated on a HEP for right shoulder ROM and strength.   Time 6   Period Weeks   Status On-going     OT SHORT TERM GOAL #2   Title Patient will improve right shoulder and elbow P/ROM to [facility] in order to complete B/ADLs with increased independence.   Time 6   Period Weeks   Status Achieved     OT SHORT TERM GOAL #3   Title Patient will improve right shoulder and elbow strength to 3+/5 for increased ability to pick up baskets of laundry.   Time 6   Period Weeks   Status On-going     OT SHORT TERM GOAL #4   Title Patient will decrease right shoulder pain to 4/10 or better in order to complete B/ADLs with greater independence.   Time 6   Period Weeks   Status On-going     OT SHORT TERM GOAL #5   Title Patient will decrease fascial restrictions to moderate in right shoulder region for greater mobility needed for greater use of right arm as dominant.    Time 6   Period Weeks   Status Achieved           OT Long Term Goals - 05/18/16 1415      OT LONG TERM GOAL #1   Title Patient will return to prior level of independence with all B/IADLs, work, and leisure activities using her right arm as dominant.    Time 12   Period Weeks   Status On-going     OT LONG TERM GOAL #2   Title Patient will improve right shoulder and elbow A/ROM to WNL for greater ability to use right arm as dominant at work and home.    Time 12   Period Weeks   Status On-going     OT LONG TERM GOAL #3   Title Patient will improve right shoulder and elbow strength to 5/5 for greater ability to lift bags of mulch when completing yardwork.     Time 12   Period Weeks   Status On-going     OT LONG TERM GOAL #4   Title Patient will decrease right shoulder pain to 2/10 or better when completing functional actiivites.    Time 12    Period Weeks   Status On-going     OT LONG TERM GOAL #5   Title Paitent will decrease right fascial restrictions in right shoulder in order to have greater  mobility needed for use of right arm with functional activiites.     Time 12   Period Weeks   Status On-going               Plan - 08/06/16 1456    Clinical Impression Statement A:  Patient has increased ease with exercises in sidelying vs seated or supine.  continues to experience catching sensation in right anterior upper arm with most A/ROM and P/ROM.     Plan P:  Continue A/ROM in sidelying for improved functional use of right arm with daily tasks.       Patient will benefit from skilled therapeutic intervention in order to improve the following deficits and impairments:  Decreased range of motion, Decreased skin integrity, Decreased scar mobility, Decreased strength, Increased muscle spasms, Increased fascial restricitons, Impaired UE functional use, Pain  Visit Diagnosis: Stiffness of right shoulder, not elsewhere classified  Other symptoms and signs involving the musculoskeletal system  Right shoulder pain, unspecified chronicity    Problem List Patient Active Problem List   Diagnosis Date Noted  . Epigastric pain 08/11/2015  . Chest pain, rule out acute myocardial infarction 08/11/2015  . Renal insufficiency 08/11/2015  . Venous stasis 04/13/2015  . Depression 04/13/2015  . Essential hypertension 09/12/2014  . PVCs (premature ventricular contractions) 09/07/2014  . Precordial pain 09/07/2014    Vangie Bicker, [place], OTR/L [phone]  08/06/2016, 2:59 PM  [place] [address] [place], [place], [address] Phone: [phone]   Fax:  [phone]  Name: Jennifer Coleman MRN: [id number] Date of Birth: January 08, 1951

## 2016-08-08 ENCOUNTER — Ambulatory Visit (INDEPENDENT_AMBULATORY_CARE_PROVIDER_SITE_OTHER): Payer: 59 | Admitting: Orthopaedic Surgery

## 2016-08-08 ENCOUNTER — Ambulatory Visit (HOSPITAL_COMMUNITY): Payer: 59

## 2016-08-08 DIAGNOSIS — M25611 Stiffness of right shoulder, not elsewhere classified: Secondary | ICD-10-CM

## 2016-08-08 DIAGNOSIS — M25511 Pain in right shoulder: Secondary | ICD-10-CM

## 2016-08-08 DIAGNOSIS — R29898 Other symptoms and signs involving the musculoskeletal system: Secondary | ICD-10-CM

## 2016-08-08 DIAGNOSIS — M19011 Primary osteoarthritis, right shoulder: Secondary | ICD-10-CM

## 2016-08-08 DIAGNOSIS — Z01818 Encounter for other preprocedural examination: Secondary | ICD-10-CM | POA: Diagnosis not present

## 2016-08-08 NOTE — Therapy (Signed)
[place], [place], [address] Phone: [phone]   Fax:  [phone]  Occupational Therapy Treatment  Patient Details  Name: Jennifer Coleman MRN: [id number] Date of Birth: [birth year] Referring Provider: Dr. Joni Fears  Encounter Date: 08/08/2016      OT End of Session - 08/08/16 1145    Visit Number 30   Number of Visits 38   Date for OT Re-Evaluation 09/07/16   Authorization Type UMR   OT Start Time 1125  pt arrived late/reassessment   OT Stop Time 1200   OT Time Calculation (min) 35 min   Activity Tolerance Patient limited by pain   Behavior During Therapy [facility] for tasks assessed/performed      Past Medical History:  Diagnosis Date  . Depression   . Family history of anesthesia complication 25 yrs ago   Sister allergic to sccinocholine - unable to come off ventilator  . Hypertension   . PONV (postoperative nausea and vomiting)   . Shingles   . Varicose veins     Past Surgical History:  Procedure Laterality Date  . CARPAL TUNNEL RELEASE    . CERVICAL DISC SURGERY  1992   Fusion  . COLONOSCOPY N/A 06/07/2016   Procedure: COLONOSCOPY;  Surgeon: Rogene Houston, MD;  [facility];  Service: Endoscopy;  Laterality: N/A;  1:00  . KNEE ARTHROSCOPY  09/16/2012   Procedure: ARTHROSCOPY KNEE;  Surgeon: [name], MD;  Location: [place];  Service: Orthopedics;  Laterality: Right;  Right Knee Arthroscopy  . SALPINGOOPHORECTOMY Left 1985  . SHOULDER ARTHROSCOPY WITH OPEN ROTATOR CUFF REPAIR AND DISTAL CLAVICLE ACROMINECTOMY Right 05/03/2016   Procedure: RIGHT SHOULDER ARTHROSCOPY WITH MINI-OPEN ROTATOR CUFF REPAIR,DISTAL CLAVICLE RESECTION, SUBACROMIAL DECOMPRESSION.;  Surgeon: [name], MD;  Location: [place];  Service: Orthopedics;  Laterality: Right;    There were no vitals filed for this visit.      Subjective Assessment - 08/08/16 1148    Subjective  S: I  just cant't lift on my own. I feel like something just tightens up and I can't make it go up.   Currently in Pain? Yes   Pain Score 5    Pain Location Shoulder   Pain Orientation Right   Pain Descriptors / Indicators Aching   Pain Type Acute pain   Pain Radiating Towards up into neck   Pain Onset More than a month ago   Pain Frequency Constant   Aggravating Factors  movement and use, abduction   Pain Relieving Factors rest, heat, massage   Effect of Pain on Daily Activities Limited use of RUE during daily tasks.             [facility] OT Assessment - 08/08/16 1128      Assessment   Diagnosis S/P Right SAD, DCE, RCR and biceps tenodesis   Onset Date 05/03/16     Precautions   Precautions Shoulder   Type of Shoulder Precautions Begin AA/ROM for 4 weeks (until 07/11/16)      Prior Function   Level of Independence Independent     AROM   Overall AROM Comments Assessed supine. IR/er adducted. A/ROM not assessed prior to this session.   AROM Assessment Site Shoulder   Right/Left Shoulder Right   Right Shoulder Flexion 20 Degrees  previous: 10   Right Shoulder ABduction 90 Degrees  previous: 75   Right Shoulder Internal Rotation 90 Degrees  previous: 82  Right Shoulder External Rotation 55 Degrees  previous: 75     PROM   Overall PROM Comments Assessed in supine, external and internal rotation with shoulder adducted   PROM Assessment Site Shoulder   Right/Left Shoulder Right   Right Shoulder Flexion 145 Degrees  previous: 130   Right Shoulder ABduction 180 Degrees  previous: 155   Right Shoulder Internal Rotation 90 Degrees  previous: same   Right Shoulder External Rotation 70 Degrees  previous: 63     Strength   Overall Strength Comments Strength not assessed prior to this session. Assessed seated with IR/er adducted.   Strength Assessment Site Shoulder   Right/Left Shoulder Right   Right Shoulder Flexion 3-/5   Right Shoulder ABduction 3-/5   Right Shoulder  Internal Rotation 3/5   Right Shoulder External Rotation 3-/5                  OT Treatments/Exercises (OP) - 08/08/16 1151      Exercises   Exercises Shoulder     Shoulder Exercises: Supine   Protraction PROM;5 reps   Horizontal ABduction PROM;5 reps   External Rotation PROM;5 reps   Internal Rotation PROM;5 reps   Flexion PROM;5 reps   ABduction PROM;5 reps     Shoulder Exercises: Sidelying   External Rotation AROM;12 reps   Internal Rotation AROM;12 reps   Flexion AROM;12 reps   ABduction AROM;12 reps   Other Sidelying Exercises Protraction 12X     Shoulder Exercises: Stretch   Wall Stretch - Flexion 2 reps;60 seconds     Manual Therapy   Manual Therapy Myofascial release   Manual therapy comments manual therapy completed seperately from all other interventions this date   Myofascial Release myofascial release and manual stretching to right upper arm, scapular, shoulder region and associated areas to decrease pain and restrictions and imrpove pain free mobility in right shoulder.                    OT Short Term Goals - 08/08/16 1148      OT SHORT TERM GOAL #1   Title Patient will be educated on a HEP for right shoulder ROM and strength.   Time 6   Period Weeks   Status Achieved     OT SHORT TERM GOAL #2   Title Patient will improve right shoulder and elbow P/ROM to [facility] in order to complete B/ADLs with increased independence.   Time 6   Period Weeks     OT SHORT TERM GOAL #3   Title Patient will improve right shoulder and elbow strength to 3+/5 for increased ability to pick up baskets of laundry.   Time 6   Period Weeks   Status On-going     OT SHORT TERM GOAL #4   Title Patient will decrease right shoulder pain to 4/10 or better in order to complete B/ADLs with greater independence.   Time 6   Period Weeks   Status On-going     OT SHORT TERM GOAL #5   Title Patient will decrease fascial restrictions to moderate in right shoulder  region for greater mobility needed for greater use of right arm as dominant.    Time 6   Period Weeks           OT Long Term Goals - 05/18/16 1415      OT LONG TERM GOAL #1   Title Patient will return to prior level of independence with all B/IADLs, work,  and leisure activities using her right arm as dominant.    Time 12   Period Weeks   Status On-going     OT LONG TERM GOAL #2   Title Patient will improve right shoulder and elbow A/ROM to WNL for greater ability to use right arm as dominant at work and home.    Time 12   Period Weeks   Status On-going     OT LONG TERM GOAL #3   Title Patient will improve right shoulder and elbow strength to 5/5 for greater ability to lift bags of mulch when completing yardwork.     Time 12   Period Weeks   Status On-going     OT LONG TERM GOAL #4   Title Patient will decrease right shoulder pain to 2/10 or better when completing functional actiivites.    Time 12   Period Weeks   Status On-going     OT LONG TERM GOAL #5   Title Paitent will decrease right fascial restrictions in right shoulder in order to have greater mobility needed for use of right arm with functional activiites.     Time 12   Period Weeks   Status On-going               Plan - 08/08/16 1335    Clinical Impression Statement A: Pt continues to have increased difficulty with all A/ROM movements. Passively patient is doing great and making progress. Patient has 3/5 STGs overall. Sidelying and stretches were focused on this session to increase functional use and ability to use RUE during daily tasks.    Plan P: Recommend patient continue OT services 2x a week for 4 weeks to continue to focus on increasing Active movement of RUE needed to complete daily tasks.   OT Home Exercise Plan 9/27: shoulder stretches      Patient will benefit from skilled therapeutic intervention in order to improve the following deficits and impairments:  Decreased range of motion,  Decreased skin integrity, Decreased scar mobility, Decreased strength, Increased muscle spasms, Increased fascial restricitons, Impaired UE functional use, Pain  Visit Diagnosis: Stiffness of right shoulder, not elsewhere classified - Plan: Ot plan of care cert/re-cert  Other symptoms and signs involving the musculoskeletal system - Plan: Ot plan of care cert/re-cert  Right shoulder pain, unspecified chronicity - Plan: Ot plan of care cert/re-cert    Problem List Patient Active Problem List   Diagnosis Date Noted  . Epigastric pain 08/11/2015  . Chest pain, rule out acute myocardial infarction 08/11/2015  . Renal insufficiency 08/11/2015  . Venous stasis 04/13/2015  . Depression 04/13/2015  . Essential hypertension 09/12/2014  . PVCs (premature ventricular contractions) 09/07/2014  . Precordial pain 09/07/2014   Ailene Ravel, OTR/L,CBIS  [phone]  08/08/2016, 1:44 PM  [place] [address] [place], [place], [address] Phone: [phone]   Fax:  [phone]  Name: Jennifer Coleman MRN: [id number] Date of Birth: Jun 30, 1951

## 2016-08-09 ENCOUNTER — Other Ambulatory Visit (INDEPENDENT_AMBULATORY_CARE_PROVIDER_SITE_OTHER): Payer: Self-pay | Admitting: Orthopaedic Surgery

## 2016-08-09 DIAGNOSIS — M25511 Pain in right shoulder: Secondary | ICD-10-CM

## 2016-08-10 ENCOUNTER — Ambulatory Visit (HOSPITAL_COMMUNITY): Payer: 59 | Admitting: Occupational Therapy

## 2016-08-10 ENCOUNTER — Encounter (HOSPITAL_COMMUNITY): Payer: Self-pay | Admitting: Occupational Therapy

## 2016-08-10 DIAGNOSIS — M25511 Pain in right shoulder: Secondary | ICD-10-CM | POA: Diagnosis not present

## 2016-08-10 DIAGNOSIS — R29898 Other symptoms and signs involving the musculoskeletal system: Secondary | ICD-10-CM

## 2016-08-10 DIAGNOSIS — M25611 Stiffness of right shoulder, not elsewhere classified: Secondary | ICD-10-CM | POA: Diagnosis not present

## 2016-08-10 DIAGNOSIS — Z01818 Encounter for other preprocedural examination: Secondary | ICD-10-CM | POA: Diagnosis not present

## 2016-08-10 NOTE — Therapy (Signed)
[place], [place], [address] Phone: [phone]   Fax:  [phone]  Occupational Therapy Treatment  Patient Details  Name: Jennifer Coleman MRN: [id number] Date of Birth: Nov 30, 1950 Referring Provider: Dr. Joni Fears  Encounter Date: 08/10/2016      OT End of Session - 08/10/16 1225    Visit Number 31   Number of Visits 38   Date for OT Re-Evaluation 09/07/16   Authorization Type UMR   OT Start Time 1036   OT Stop Time 1120   OT Time Calculation (min) 44 min   Activity Tolerance Patient limited by pain   Behavior During Therapy [facility] for tasks assessed/performed      Past Medical History:  Diagnosis Date  . Depression   . Family history of anesthesia complication 25 yrs ago   Sister allergic to sccinocholine - unable to come off ventilator  . Hypertension   . PONV (postoperative nausea and vomiting)   . Shingles   . Varicose veins     Past Surgical History:  Procedure Laterality Date  . CARPAL TUNNEL RELEASE    . CERVICAL DISC SURGERY  1992   Fusion  . COLONOSCOPY N/A 06/07/2016   Procedure: COLONOSCOPY;  Surgeon: Rogene Houston, MD;  [facility];  Service: Endoscopy;  Laterality: N/A;  1:00  . KNEE ARTHROSCOPY  09/16/2012   Procedure: ARTHROSCOPY KNEE;  Surgeon: [name], MD;  Location: [place];  Service: Orthopedics;  Laterality: Right;  Right Knee Arthroscopy  . SALPINGOOPHORECTOMY Left 1985  . SHOULDER ARTHROSCOPY WITH OPEN ROTATOR CUFF REPAIR AND DISTAL CLAVICLE ACROMINECTOMY Right 05/03/2016   Procedure: RIGHT SHOULDER ARTHROSCOPY WITH MINI-OPEN ROTATOR CUFF REPAIR,DISTAL CLAVICLE RESECTION, SUBACROMIAL DECOMPRESSION.;  Surgeon: [name], MD;  Location: [place];  Service: Orthopedics;  Laterality: Right;    There were no vitals filed for this visit.      Subjective Assessment - 08/10/16 1038    Subjective  S: Dr. Durward Fortes is not pleased and  is going to send me for an MRI.    Currently in Pain? Yes   Pain Score 4    Pain Location Shoulder   Pain Orientation Right   Pain Descriptors / Indicators Aching   Pain Type Acute pain   Pain Radiating Towards up to neck   Pain Onset More than a month ago   Pain Frequency Constant   Aggravating Factors  movement and use, abduction   Pain Relieving Factors rest, heat, massage   Effect of Pain on Daily Activities limited use of RUE during daily tasks   Multiple Pain Sites No            [facility] OT Assessment - 08/10/16 1037      Assessment   Diagnosis S/P Right SAD, DCE, RCR and biceps tenodesis     Precautions   Precautions Shoulder   Type of Shoulder Precautions Begin AA/ROM for 4 weeks (until 07/11/16)                   OT Treatments/Exercises (OP) - 08/10/16 1041      Exercises   Exercises Shoulder     Shoulder Exercises: Supine   Protraction PROM;5 reps;AROM;10 reps   Horizontal ABduction PROM;AROM;5 reps   External Rotation PROM;5 reps;AROM;10 reps   Internal Rotation PROM;5 reps;AROM;10 reps   Flexion PROM;5 reps   Flexion Limitations unable to complete A/ROM due to pain   ABduction PROM;5 reps;AROM;10  reps     Shoulder Exercises: Sidelying   External Rotation AROM;12 reps   Internal Rotation AROM;12 reps   Flexion AROM;12 reps   ABduction AROM;12 reps   Other Sidelying Exercises Protraction 12X     Shoulder Exercises: Stretch   Wall Stretch - Flexion 2 reps;60 seconds     Manual Therapy   Manual Therapy Myofascial release   Manual therapy comments manual therapy completed seperately from all other interventions this date   Myofascial Release myofascial release and manual stretching to right upper arm, scapular, shoulder region and associated areas to decrease pain and restrictions and imrpove pain free mobility in right shoulder.                    OT Short Term Goals - 08/08/16 1148      OT SHORT TERM GOAL #1   Title Patient  will be educated on a HEP for right shoulder ROM and strength.   Time 6   Period Weeks   Status Achieved     OT SHORT TERM GOAL #2   Title Patient will improve right shoulder and elbow P/ROM to [facility] in order to complete B/ADLs with increased independence.   Time 6   Period Weeks     OT SHORT TERM GOAL #3   Title Patient will improve right shoulder and elbow strength to 3+/5 for increased ability to pick up baskets of laundry.   Time 6   Period Weeks   Status On-going     OT SHORT TERM GOAL #4   Title Patient will decrease right shoulder pain to 4/10 or better in order to complete B/ADLs with greater independence.   Time 6   Period Weeks   Status On-going     OT SHORT TERM GOAL #5   Title Patient will decrease fascial restrictions to moderate in right shoulder region for greater mobility needed for greater use of right arm as dominant.    Time 6   Period Weeks           OT Long Term Goals - 05/18/16 1415      OT LONG TERM GOAL #1   Title Patient will return to prior level of independence with all B/IADLs, work, and leisure activities using her right arm as dominant.    Time 12   Period Weeks   Status On-going     OT LONG TERM GOAL #2   Title Patient will improve right shoulder and elbow A/ROM to WNL for greater ability to use right arm as dominant at work and home.    Time 12   Period Weeks   Status On-going     OT LONG TERM GOAL #3   Title Patient will improve right shoulder and elbow strength to 5/5 for greater ability to lift bags of mulch when completing yardwork.     Time 12   Period Weeks   Status On-going     OT LONG TERM GOAL #4   Title Patient will decrease right shoulder pain to 2/10 or better when completing functional actiivites.    Time 12   Period Weeks   Status On-going     OT LONG TERM GOAL #5   Title Paitent will decrease right fascial restrictions in right shoulder in order to have greater mobility needed for use of right arm with functional  activiites.     Time 12   Period Weeks   Status On-going  Plan - 08/10/16 1225    Clinical Impression Statement A: Pt continues to have pain with all A/ROM exercises, pain is lessened in sidelying however movements are still difficult. Pt reports MD is sending her for an MRI to determine if anything has torn or is loose in the shoulder. P/ROM continues to improve.   Plan P: Follow up on MD appt, continue focusing on improving A/ROM within pt's pain tolerance   OT Home Exercise Plan 9/27: shoulder stretches      Patient will benefit from skilled therapeutic intervention in order to improve the following deficits and impairments:     Visit Diagnosis: Stiffness of right shoulder, not elsewhere classified  Other symptoms and signs involving the musculoskeletal system  Right shoulder pain, unspecified chronicity    Problem List Patient Active Problem List   Diagnosis Date Noted  . Epigastric pain 08/11/2015  . Chest pain, rule out acute myocardial infarction 08/11/2015  . Renal insufficiency 08/11/2015  . Venous stasis 04/13/2015  . Depression 04/13/2015  . Essential hypertension 09/12/2014  . PVCs (premature ventricular contractions) 09/07/2014  . Precordial pain 09/07/2014   Guadelupe Sabin, OTR/L  [phone] 08/10/2016, 12:28 PM  [place] [address] [place], [place], [address] Phone: [phone]   Fax:  [phone]  Name: Jennifer Coleman MRN: [id number] Date of Birth: 1950-12-26

## 2016-08-13 ENCOUNTER — Telehealth (HOSPITAL_COMMUNITY): Payer: Self-pay

## 2016-08-13 ENCOUNTER — Ambulatory Visit (HOSPITAL_COMMUNITY): Payer: 59

## 2016-08-13 NOTE — Telephone Encounter (Signed)
She has a Migraine Headache and cannot make it today

## 2016-08-14 ENCOUNTER — Other Ambulatory Visit (INDEPENDENT_AMBULATORY_CARE_PROVIDER_SITE_OTHER): Payer: Self-pay | Admitting: Orthopaedic Surgery

## 2016-08-14 ENCOUNTER — Ambulatory Visit (HOSPITAL_COMMUNITY)
Admission: RE | Admit: 2016-08-14 | Discharge: 2016-08-14 | Disposition: A | Discharge status: Home / Self Care | Payer: 59 | Source: Ambulatory Visit | Attending: Orthopaedic Surgery | Admitting: Orthopaedic Surgery

## 2016-08-14 ENCOUNTER — Encounter (HOSPITAL_COMMUNITY): Payer: Self-pay

## 2016-08-14 DIAGNOSIS — M25511 Pain in right shoulder: Secondary | ICD-10-CM | POA: Diagnosis not present

## 2016-08-14 DIAGNOSIS — R938 Abnormal findings on diagnostic imaging of other specified body structures: Secondary | ICD-10-CM | POA: Insufficient documentation

## 2016-08-14 DIAGNOSIS — M7581 Other shoulder lesions, right shoulder: Secondary | ICD-10-CM | POA: Diagnosis not present

## 2016-08-14 MED ORDER — IOPAMIDOL (ISOVUE-300) INJECTION 61%
INTRAVENOUS | Status: AC
  Filled 2016-08-14: qty 30

## 2016-08-14 MED ORDER — GADOBENATE DIMEGLUMINE 529 MG/ML IV SOLN
5.0000 mL | Freq: Once | INTRAVENOUS | Status: AC | PRN
  Administered 2016-08-14: 0.5 mL via INTRAVENOUS

## 2016-08-14 MED ORDER — IOHEXOL 300 MG/ML  SOLN: 30.0000 mL | Freq: Once | INTRAMUSCULAR | Status: DC | PRN

## 2016-08-14 MED ORDER — IOPAMIDOL (ISOVUE-300) INJECTION 61%
INTRAVENOUS | Status: AC
  Administered 2016-08-14: 12 mL
  Filled 2016-08-14: qty 30

## 2016-08-14 MED ORDER — LIDOCAINE HCL (PF) 1 % IJ SOLN
10.0000 mL | Freq: Once | INTRAMUSCULAR | Status: AC
  Administered 2016-08-14: 10 mL

## 2016-08-14 MED ORDER — LIDOCAINE HCL (PF) 1 % IJ SOLN
INTRAMUSCULAR | Status: AC
  Administered 2016-08-14: 10 mL
  Filled 2016-08-14: qty 5

## 2016-08-14 MED ORDER — LIDOCAINE HCL (PF) 1 % IJ SOLN
INTRAMUSCULAR | Status: AC
  Filled 2016-08-14: qty 5

## 2016-08-14 NOTE — Progress Notes (Signed)
Patient ID: Jennifer Coleman, female   DOB: 18-Jul-1951, 65 y.o.   MRN: [id number]   Successful right shoulder arthrogram for MRI.  JWatts MD

## 2016-08-15 ENCOUNTER — Ambulatory Visit (INDEPENDENT_AMBULATORY_CARE_PROVIDER_SITE_OTHER): Payer: 59 | Admitting: Orthopaedic Surgery

## 2016-08-15 ENCOUNTER — Ambulatory Visit (HOSPITAL_COMMUNITY): Payer: 59 | Admitting: Occupational Therapy

## 2016-08-15 ENCOUNTER — Encounter (HOSPITAL_COMMUNITY): Payer: Self-pay | Admitting: Occupational Therapy

## 2016-08-15 DIAGNOSIS — M25611 Stiffness of right shoulder, not elsewhere classified: Secondary | ICD-10-CM | POA: Diagnosis not present

## 2016-08-15 DIAGNOSIS — R29898 Other symptoms and signs involving the musculoskeletal system: Secondary | ICD-10-CM | POA: Diagnosis not present

## 2016-08-15 DIAGNOSIS — M19011 Primary osteoarthritis, right shoulder: Secondary | ICD-10-CM

## 2016-08-15 DIAGNOSIS — M25511 Pain in right shoulder: Secondary | ICD-10-CM

## 2016-08-15 DIAGNOSIS — Z01818 Encounter for other preprocedural examination: Secondary | ICD-10-CM | POA: Diagnosis not present

## 2016-08-15 NOTE — Therapy (Signed)
[place], [place], [address] Phone: [phone]   Fax:  [phone]  Occupational Therapy Treatment  Patient Details  Name: Jennifer Coleman MRN: [id number] Date of Birth: May 02, 1951 Referring Provider: Dr. Joni Fears  Encounter Date: 08/15/2016      OT End of Session - 08/15/16 1115    Visit Number 32   Number of Visits 38   Date for OT Re-Evaluation 09/07/16   Authorization Type UMR   OT Start Time 1036   OT Stop Time 1110   OT Time Calculation (min) 34 min   Activity Tolerance Patient limited by pain   Behavior During Therapy [facility] for tasks assessed/performed      Past Medical History:  Diagnosis Date  . Depression   . Family history of anesthesia complication 25 yrs ago   Sister allergic to sccinocholine - unable to come off ventilator  . Hypertension   . PONV (postoperative nausea and vomiting)   . Shingles   . Varicose veins     Past Surgical History:  Procedure Laterality Date  . CARPAL TUNNEL RELEASE    . CERVICAL DISC SURGERY  1992   Fusion  . COLONOSCOPY N/A 06/07/2016   Procedure: COLONOSCOPY;  Surgeon: Rogene Houston, MD;  [facility];  Service: Endoscopy;  Laterality: N/A;  1:00  . KNEE ARTHROSCOPY  09/16/2012   Procedure: ARTHROSCOPY KNEE;  Surgeon: [name], MD;  Location: [place];  Service: Orthopedics;  Laterality: Right;  Right Knee Arthroscopy  . SALPINGOOPHORECTOMY Left 1985  . SHOULDER ARTHROSCOPY WITH OPEN ROTATOR CUFF REPAIR AND DISTAL CLAVICLE ACROMINECTOMY Right 05/03/2016   Procedure: RIGHT SHOULDER ARTHROSCOPY WITH MINI-OPEN ROTATOR CUFF REPAIR,DISTAL CLAVICLE RESECTION, SUBACROMIAL DECOMPRESSION.;  Surgeon: [name], MD;  Location: [place];  Service: Orthopedics;  Laterality: Right;    There were no vitals filed for this visit.      Subjective Assessment - 08/15/16 1047    Subjective  S: I'm afraid I'm going to lose my  job being out for so long.    Currently in Pain? Yes   Pain Score 4    Pain Location Shoulder   Pain Orientation Right   Pain Descriptors / Indicators Aching   Pain Type Acute pain   Pain Radiating Towards up to neck   Pain Onset More than a month ago   Pain Frequency Constant   Aggravating Factors  movement and use, abduction   Pain Relieving Factors rest, heat, ice, massage   Effect of Pain on Daily Activities limited use of RUE during daily tasks   Multiple Pain Sites No                      OT Treatments/Exercises (OP) - 08/15/16 1114      Functional Reaching Activities   Mid Level Pt completed pinch tress this session, placing pins along top horizontal bar as well as up to 18 inches from top of vertical pole. Pt then removed pins. Pain experienced at 90 degrees of flexion     Manual Therapy   Manual Therapy Myofascial release   Manual therapy comments manual therapy completed seperately from all other interventions this date   Myofascial Release myofascial release and manual stretching to right upper arm, scapular, shoulder region and associated areas to decrease pain and restrictions and imrpove pain free mobility in right shoulder.  OT Short Term Goals - 08/08/16 1148      OT SHORT TERM GOAL #1   Title Patient will be educated on a HEP for right shoulder ROM and strength.   Time 6   Period Weeks   Status Achieved     OT SHORT TERM GOAL #2   Title Patient will improve right shoulder and elbow P/ROM to [facility] in order to complete B/ADLs with increased independence.   Time 6   Period Weeks     OT SHORT TERM GOAL #3   Title Patient will improve right shoulder and elbow strength to 3+/5 for increased ability to pick up baskets of laundry.   Time 6   Period Weeks   Status On-going     OT SHORT TERM GOAL #4   Title Patient will decrease right shoulder pain to 4/10 or better in order to complete B/ADLs with greater independence.    Time 6   Period Weeks   Status On-going     OT SHORT TERM GOAL #5   Title Patient will decrease fascial restrictions to moderate in right shoulder region for greater mobility needed for greater use of right arm as dominant.    Time 6   Period Weeks           OT Long Term Goals - 05/18/16 1415      OT LONG TERM GOAL #1   Title Patient will return to prior level of independence with all B/IADLs, work, and leisure activities using her right arm as dominant.    Time 12   Period Weeks   Status On-going     OT LONG TERM GOAL #2   Title Patient will improve right shoulder and elbow A/ROM to WNL for greater ability to use right arm as dominant at work and home.    Time 12   Period Weeks   Status On-going     OT LONG TERM GOAL #3   Title Patient will improve right shoulder and elbow strength to 5/5 for greater ability to lift bags of mulch when completing yardwork.     Time 12   Period Weeks   Status On-going     OT LONG TERM GOAL #4   Title Patient will decrease right shoulder pain to 2/10 or better when completing functional actiivites.    Time 12   Period Weeks   Status On-going     OT LONG TERM GOAL #5   Title Paitent will decrease right fascial restrictions in right shoulder in order to have greater mobility needed for use of right arm with functional activiites.     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 Therefore only completed myofascial release and functional reaching until pt sees MD later today to determine next steps. Pt continues to report pain in anterior shoulder region, worsening with activation of muscle, pain during functional reaching is moderate.    Plan P: Follow up on MD appt, potentially holding therapy until MD approves resumption.       Patient will benefit from skilled therapeutic  intervention in order to improve the following deficits and impairments:  Decreased range of motion, Decreased skin integrity, Decreased scar mobility, Decreased strength, Increased muscle spasms, Increased fascial restricitons, Impaired UE functional use,  Pain  Visit Diagnosis: Stiffness of right shoulder, not elsewhere classified  Other symptoms and signs involving the musculoskeletal system  Right shoulder pain, unspecified chronicity    Problem List Patient Active Problem List   Diagnosis Date Noted  . Epigastric pain 08/11/2015  . Chest pain, rule out acute myocardial infarction 08/11/2015  . Renal insufficiency 08/11/2015  . Venous stasis 04/13/2015  . Depression 04/13/2015  . Essential hypertension 09/12/2014  . PVCs (premature ventricular contractions) 09/07/2014  . Precordial pain 09/07/2014   Guadelupe Sabin, OTR/L  [phone] 08/15/2016, 11:57 AM  [place] [address] [place], [place], [address] Phone: [phone]   Fax:  [phone]  Name: YAKIA LACLAIRE MRN: [id number] Date of Birth: 1951/02/05

## 2016-08-17 ENCOUNTER — Ambulatory Visit (HOSPITAL_COMMUNITY): Payer: 59 | Admitting: Occupational Therapy

## 2016-08-17 ENCOUNTER — Telehealth (HOSPITAL_COMMUNITY): Payer: Self-pay | Admitting: Occupational Therapy

## 2016-08-17 NOTE — Telephone Encounter (Signed)
She has a sick granddaughter and the school just called for her to pick her up. Jennifer Coleman was so sorry this happen

## 2016-08-20 ENCOUNTER — Ambulatory Visit (HOSPITAL_COMMUNITY): Payer: 59

## 2016-08-20 DIAGNOSIS — M25611 Stiffness of right shoulder, not elsewhere classified: Secondary | ICD-10-CM | POA: Diagnosis not present

## 2016-08-20 DIAGNOSIS — R29898 Other symptoms and signs involving the musculoskeletal system: Secondary | ICD-10-CM

## 2016-08-20 DIAGNOSIS — M25511 Pain in right shoulder: Secondary | ICD-10-CM

## 2016-08-20 DIAGNOSIS — Z01818 Encounter for other preprocedural examination: Secondary | ICD-10-CM | POA: Diagnosis not present

## 2016-08-20 NOTE — Therapy (Signed)
[place], [place], [address] Phone: [phone]   Fax:  [phone]  Occupational Therapy Treatment  Patient Details  Name: Jennifer Coleman MRN: [id number] Date of Birth: 08-16-1951 Referring Provider: Dr. Joni Fears  Encounter Date: 08/20/2016      OT End of Session - 08/20/16 1158    Visit Number 33   Number of Visits 38   Date for OT Re-Evaluation 09/07/16   Authorization Type UMR   OT Start Time 1040  Pt arrived late   OT Stop Time 1115   OT Time Calculation (min) 35 min   Activity Tolerance Patient limited by pain   Behavior During Therapy [facility] for tasks assessed/performed      Past Medical History:  Diagnosis Date  . Depression   . Family history of anesthesia complication 25 yrs ago   Sister allergic to sccinocholine - unable to come off ventilator  . Hypertension   . PONV (postoperative nausea and vomiting)   . Shingles   . Varicose veins     Past Surgical History:  Procedure Laterality Date  . CARPAL TUNNEL RELEASE    . CERVICAL DISC SURGERY  1992   Fusion  . COLONOSCOPY N/A 06/07/2016   Procedure: COLONOSCOPY;  Surgeon: Rogene Houston, MD;  [facility];  Service: Endoscopy;  Laterality: N/A;  1:00  . KNEE ARTHROSCOPY  09/16/2012   Procedure: ARTHROSCOPY KNEE;  Surgeon: [name], MD;  Location: [place];  Service: Orthopedics;  Laterality: Right;  Right Knee Arthroscopy  . SALPINGOOPHORECTOMY Left 1985  . SHOULDER ARTHROSCOPY WITH OPEN ROTATOR CUFF REPAIR AND DISTAL CLAVICLE ACROMINECTOMY Right 05/03/2016   Procedure: RIGHT SHOULDER ARTHROSCOPY WITH MINI-OPEN ROTATOR CUFF REPAIR,DISTAL CLAVICLE RESECTION, SUBACROMIAL DECOMPRESSION.;  Surgeon: [name], MD;  Location: [facility];  Service: Orthopedics;  Laterality: Right;    There were no vitals filed for this visit.      Subjective Assessment - 08/20/16 1153    Subjective  S: When the doctor  comes back on the 30th I'm going to call the office and have them and schedule the procedure to fix my shoulder.   Currently in Pain? Yes   Pain Score 4    Pain Location Shoulder   Pain Orientation Right   Pain Descriptors / Indicators Aching   Pain Type Acute pain            [facility] OT Assessment - 08/20/16 1100      Assessment   Diagnosis S/P Right SAD, DCE, RCR and biceps tenodesis     Precautions   Precautions Shoulder   Type of Shoulder Precautions Begin AA/ROM for 4 weeks (until 07/11/16)                   OT Treatments/Exercises (OP) - 08/20/16 1100      Exercises   Exercises Shoulder     Shoulder Exercises: Supine   Protraction PROM;5 reps;AROM;10 reps   Horizontal ABduction PROM;5 reps;AROM;10 reps   External Rotation PROM;5 reps;AROM;10 reps   Internal Rotation PROM;5 reps;AROM;10 reps   Flexion PROM;5 reps   Flexion Limitations unable to complete A/ROM due to pain   ABduction PROM;5 reps     Shoulder Exercises: Sidelying   External Rotation AROM;12 reps   Internal Rotation AROM;12 reps   Flexion AROM;12 reps   ABduction AROM;12 reps   Other Sidelying Exercises Protraction 12X     Manual Therapy   Manual  Therapy Myofascial release   Manual therapy comments manual therapy completed seperately from all other interventions this date   Myofascial Release myofascial release and manual stretching to right upper arm, scapular, shoulder region and associated areas to decrease pain and restrictions and imrpove pain free mobility in right shoulder.                    OT Short Term Goals - 08/08/16 1148      OT SHORT TERM GOAL #1   Title Patient will be educated on a HEP for right shoulder ROM and strength.   Time 6   Period Weeks   Status Achieved     OT SHORT TERM GOAL #2   Title Patient will improve right shoulder and elbow P/ROM to [facility] in order to complete B/ADLs with increased independence.   Time 6   Period Weeks     OT SHORT TERM  GOAL #3   Title Patient will improve right shoulder and elbow strength to 3+/5 for increased ability to pick up baskets of laundry.   Time 6   Period Weeks   Status On-going     OT SHORT TERM GOAL #4   Title Patient will decrease right shoulder pain to 4/10 or better in order to complete B/ADLs with greater independence.   Time 6   Period Weeks   Status On-going     OT SHORT TERM GOAL #5   Title Patient will decrease fascial restrictions to moderate in right shoulder region for greater mobility needed for greater use of right arm as dominant.    Time 6   Period Weeks           OT Long Term Goals - 05/18/16 1415      OT LONG TERM GOAL #1   Title Patient will return to prior level of independence with all B/IADLs, work, and leisure activities using her right arm as dominant.    Time 12   Period Weeks   Status On-going     OT LONG TERM GOAL #2   Title Patient will improve right shoulder and elbow A/ROM to WNL for greater ability to use right arm as dominant at work and home.    Time 12   Period Weeks   Status On-going     OT LONG TERM GOAL #3   Title Patient will improve right shoulder and elbow strength to 5/5 for greater ability to lift bags of mulch when completing yardwork.     Time 12   Period Weeks   Status On-going     OT LONG TERM GOAL #4   Title Patient will decrease right shoulder pain to 2/10 or better when completing functional actiivites.    Time 12   Period Weeks   Status On-going     OT LONG TERM GOAL #5   Title Paitent will decrease right fascial restrictions in right shoulder in order to have greater mobility needed for use of right arm with functional activiites.     Time 12   Period Weeks   Status On-going               Plan - 08/20/16 1159    Clinical Impression Statement A: Discussed MD appointment and options for surgery versus no surgery. Session focused on gentle stretches and completing exercises within pain tolerance. Discussed  that we will finish the remaining scheduled appointments and discharge on 10/30.    Plan P: Continue to complete gentle stretching  and completing exercises within pain tolerance.      Patient will benefit from skilled therapeutic intervention in order to improve the following deficits and impairments:  Decreased range of motion, Decreased skin integrity, Decreased scar mobility, Decreased strength, Increased muscle spasms, Increased fascial restricitons, Impaired UE functional use, Pain  Visit Diagnosis: Stiffness of right shoulder, not elsewhere classified  Other symptoms and signs involving the musculoskeletal system  Right shoulder pain, unspecified chronicity    Problem List Patient Active Problem List   Diagnosis Date Noted  . Epigastric pain 08/11/2015  . Chest pain, rule out acute myocardial infarction 08/11/2015  . Renal insufficiency 08/11/2015  . Venous stasis 04/13/2015  . Depression 04/13/2015  . Essential hypertension 09/12/2014  . PVCs (premature ventricular contractions) 09/07/2014  . Precordial pain 09/07/2014   Ailene Ravel, OTR/L,CBIS  [phone]  08/20/2016, 12:31 PM  [place] [address] [place], [place], [address] Phone: [phone]   Fax:  [phone]  Name: DESTIN MCABEE MRN: [id number] Date of Birth: Nov 15, 1950

## 2016-08-22 ENCOUNTER — Encounter (HOSPITAL_COMMUNITY): Payer: Self-pay

## 2016-08-22 ENCOUNTER — Ambulatory Visit (HOSPITAL_COMMUNITY): Payer: 59

## 2016-08-22 DIAGNOSIS — M25511 Pain in right shoulder: Secondary | ICD-10-CM | POA: Diagnosis not present

## 2016-08-22 DIAGNOSIS — M25611 Stiffness of right shoulder, not elsewhere classified: Secondary | ICD-10-CM | POA: Diagnosis not present

## 2016-08-22 DIAGNOSIS — Z01818 Encounter for other preprocedural examination: Secondary | ICD-10-CM | POA: Diagnosis not present

## 2016-08-22 DIAGNOSIS — R29898 Other symptoms and signs involving the musculoskeletal system: Secondary | ICD-10-CM

## 2016-08-22 NOTE — Therapy (Signed)
[place], [place], [address] Phone: [phone]   Fax:  [phone]  Occupational Therapy Treatment  Patient Details  Name: Jennifer Coleman MRN: [id number] Date of Birth: 20-Mar-1951 Referring Provider: Dr. Joni Fears  Encounter Date: 08/22/2016      OT End of Session - 08/22/16 1117    Visit Number 34   Number of Visits 38   Date for OT Re-Evaluation 09/07/16   Authorization Type UMR   OT Start Time 1045  Pt arrived late   OT Stop Time 1115   OT Time Calculation (min) 30 min   Activity Tolerance Patient limited by pain   Behavior During Therapy [facility] for tasks assessed/performed      Past Medical History:  Diagnosis Date  . Depression   . Family history of anesthesia complication 25 yrs ago   Sister allergic to sccinocholine - unable to come off ventilator  . Hypertension   . PONV (postoperative nausea and vomiting)   . Shingles   . Varicose veins     Past Surgical History:  Procedure Laterality Date  . CARPAL TUNNEL RELEASE    . CERVICAL DISC SURGERY  1992   Fusion  . COLONOSCOPY N/A 06/07/2016   Procedure: COLONOSCOPY;  Surgeon: Rogene Houston, MD;  [facility];  Service: Endoscopy;  Laterality: N/A;  1:00  . KNEE ARTHROSCOPY  09/16/2012   Procedure: ARTHROSCOPY KNEE;  Surgeon: [name], MD;  Location: [facility];  Service: Orthopedics;  Laterality: Right;  Right Knee Arthroscopy  . SALPINGOOPHORECTOMY Left 1985  . SHOULDER ARTHROSCOPY WITH OPEN ROTATOR CUFF REPAIR AND DISTAL CLAVICLE ACROMINECTOMY Right 05/03/2016   Procedure: RIGHT SHOULDER ARTHROSCOPY WITH MINI-OPEN ROTATOR CUFF REPAIR,DISTAL CLAVICLE RESECTION, SUBACROMIAL DECOMPRESSION.;  Surgeon: [name], MD;  Location: [facility];  Service: Orthopedics;  Laterality: Right;    There were no vitals filed for this visit.      Subjective Assessment - 08/22/16 1222    Subjective  S: I see the PA on  Wednesday to schedule the surgery.   Currently in Pain? Yes   Pain Score 5    Pain Location Shoulder   Pain Orientation Right   Pain Descriptors / Indicators Aching   Pain Type Acute pain   Pain Radiating Towards across deltoid   Pain Onset More than a month ago   Pain Frequency Constant   Aggravating Factors  movement and use, abduction   Pain Relieving Factors rest, heat, ice, massage   Effect of Pain on Daily Activities limited use of RUE during daily tasks   Multiple Pain Sites No                      OT Treatments/Exercises (OP) - 08/22/16 1110      Exercises   Exercises Shoulder     Shoulder Exercises: Sidelying   External Rotation AROM;12 reps   Internal Rotation AROM;12 reps   Flexion AROM;12 reps   ABduction AROM;12 reps   Other Sidelying Exercises Protraction 12X     Ultrasound   Ultrasound Location right deltoid   Ultrasound Parameters 1.5 mHz   Ultrasound Goals Pain     Manual Therapy   Manual Therapy Myofascial release   Manual therapy comments manual therapy completed seperately from all other interventions this date   Myofascial Release myofascial release and manual stretching to right upper arm, scapular, shoulder region and associated areas to decrease pain and  restrictions and imrpove pain free mobility in right shoulder.                    OT Short Term Goals - 08/08/16 1148      OT SHORT TERM GOAL #1   Title Patient will be educated on a HEP for right shoulder ROM and strength.   Time 6   Period Weeks   Status Achieved     OT SHORT TERM GOAL #2   Title Patient will improve right shoulder and elbow P/ROM to [facility] in order to complete B/ADLs with increased independence.   Time 6   Period Weeks     OT SHORT TERM GOAL #3   Title Patient will improve right shoulder and elbow strength to 3+/5 for increased ability to pick up baskets of laundry.   Time 6   Period Weeks   Status On-going     OT SHORT TERM GOAL #4   Title  Patient will decrease right shoulder pain to 4/10 or better in order to complete B/ADLs with greater independence.   Time 6   Period Weeks   Status On-going     OT SHORT TERM GOAL #5   Title Patient will decrease fascial restrictions to moderate in right shoulder region for greater mobility needed for greater use of right arm as dominant.    Time 6   Period Weeks           OT Long Term Goals - 05/18/16 1415      OT LONG TERM GOAL #1   Title Patient will return to prior level of independence with all B/IADLs, work, and leisure activities using her right arm as dominant.    Time 12   Period Weeks   Status On-going     OT LONG TERM GOAL #2   Title Patient will improve right shoulder and elbow A/ROM to WNL for greater ability to use right arm as dominant at work and home.    Time 12   Period Weeks   Status On-going     OT LONG TERM GOAL #3   Title Patient will improve right shoulder and elbow strength to 5/5 for greater ability to lift bags of mulch when completing yardwork.     Time 12   Period Weeks   Status On-going     OT LONG TERM GOAL #4   Title Patient will decrease right shoulder pain to 2/10 or better when completing functional actiivites.    Time 12   Period Weeks   Status On-going     OT LONG TERM GOAL #5   Title Paitent will decrease right fascial restrictions in right shoulder in order to have greater mobility needed for use of right arm with functional activiites.     Time 12   Period Weeks   Status On-going               Plan - 08/22/16 1117    Clinical Impression Statement A: Pt reports that she will be seeing the PA on Wednesday next week to schedule surgery for her shoulder. Session focused on pain management and gentle exercises without increased pain.    Plan P: D/C next session due to planned surgery.      Patient will benefit from skilled therapeutic intervention in order to improve the following deficits and impairments:  Decreased  range of motion, Decreased skin integrity, Decreased scar mobility, Decreased strength, Increased muscle spasms, Increased fascial restricitons, Impaired UE functional  use, Pain  Visit Diagnosis: Stiffness of right shoulder, not elsewhere classified  Other symptoms and signs involving the musculoskeletal system  Right shoulder pain, unspecified chronicity    Problem List Patient Active Problem List   Diagnosis Date Noted  . Epigastric pain 08/11/2015  . Chest pain, rule out acute myocardial infarction 08/11/2015  . Renal insufficiency 08/11/2015  . Venous stasis 04/13/2015  . Depression 04/13/2015  . Essential hypertension 09/12/2014  . PVCs (premature ventricular contractions) 09/07/2014  . Precordial pain 09/07/2014   Ailene Ravel, OTR/L,CBIS  [phone]  08/22/2016, 12:45 PM  [place] [address] [place], [place], [address] Phone: [phone]   Fax:  [phone]  Name: Jennifer Coleman MRN: [id number] Date of Birth: 18-Apr-1951

## 2016-08-24 ENCOUNTER — Ambulatory Visit (HOSPITAL_COMMUNITY): Payer: 59

## 2016-08-27 ENCOUNTER — Ambulatory Visit (HOSPITAL_COMMUNITY): Payer: 59

## 2016-08-27 ENCOUNTER — Encounter (HOSPITAL_COMMUNITY): Payer: Self-pay

## 2016-08-27 DIAGNOSIS — M25611 Stiffness of right shoulder, not elsewhere classified: Secondary | ICD-10-CM | POA: Diagnosis not present

## 2016-08-27 DIAGNOSIS — R29898 Other symptoms and signs involving the musculoskeletal system: Secondary | ICD-10-CM | POA: Diagnosis not present

## 2016-08-27 DIAGNOSIS — M25511 Pain in right shoulder: Secondary | ICD-10-CM | POA: Diagnosis not present

## 2016-08-27 DIAGNOSIS — Z01818 Encounter for other preprocedural examination: Secondary | ICD-10-CM | POA: Diagnosis not present

## 2016-08-27 NOTE — Therapy (Signed)
[facility] [address] [place], , [address] Phone: [phone]   Fax:  [phone]  Occupational Therapy Treatment  Patient Details  Name: Jennifer Coleman MRN: [id number] Date of Birth: 09/30/1951 Referring Provider: Dr. Peter Whitfield  Encounter Date: 08/27/2016      OT End of Session - 08/27/16 1112    Visit Number 35   Number of Visits 38   Date for OT Re-Evaluation 09/07/16   Authorization Type UMR   OT Start Time 1035   OT Stop Time 1115   OT Time Calculation (min) 40 min   Activity Tolerance Patient limited by pain   Behavior During Therapy [facility] for tasks assessed/performed      Past Medical History:  Diagnosis Date  . Depression   . Family history of anesthesia complication 25 yrs ago   Sister allergic to sccinocholine - unable to come off ventilator  . Hypertension   . PONV (postoperative nausea and vomiting)   . Shingles   . Varicose veins     Past Surgical History:  Procedure Laterality Date  . CARPAL TUNNEL RELEASE    . CERVICAL DISC SURGERY  1992   Fusion  . COLONOSCOPY N/A 06/07/2016   Procedure: COLONOSCOPY;  Surgeon: Najeeb U Rehman, MD;  [facility];  Service: Endoscopy;  Laterality: N/A;  1:00  . KNEE ARTHROSCOPY  09/16/2012   Procedure: ARTHROSCOPY KNEE;  Surgeon: Peter W Whitfield, MD;  [facility];  Service: Orthopedics;  Laterality: Right;  Right Knee Arthroscopy  . SALPINGOOPHORECTOMY Left 1985  . SHOULDER ARTHROSCOPY WITH OPEN ROTATOR CUFF REPAIR AND DISTAL CLAVICLE ACROMINECTOMY Right 05/03/2016   Procedure: RIGHT SHOULDER ARTHROSCOPY WITH MINI-OPEN ROTATOR CUFF REPAIR,DISTAL CLAVICLE RESECTION, SUBACROMIAL DECOMPRESSION.;  Surgeon: Peter W Whitfield, MD;  Location: [facility];  Service: Orthopedics;  Laterality: Right;    There were no vitals filed for this visit.      Subjective Assessment - 08/27/16 1106    Subjective  S: Without thinking I went to itch  the back of my neck because I felt something biting me. And Oh that hurt!   Currently in Pain? Yes   Pain Score 5    Pain Location Shoulder   Pain Orientation Right   Pain Descriptors / Indicators Aching   Pain Type Acute pain            [facility] OT Assessment - 08/27/16 1040      Assessment   Diagnosis S/P Right SAD, DCE, RCR and biceps tenodesis     Precautions   Precautions Shoulder   Type of Shoulder Precautions Begin AA/ROM for 4 weeks (until 07/11/16)      AROM   Overall AROM Comments Assessed supine. IR/er adducted. A/ROM not assessed prior to this session.   AROM Assessment Site Shoulder   Right/Left Shoulder Right   Right Shoulder Flexion 20 Degrees   Right Shoulder ABduction 90 Degrees   Right Shoulder Internal Rotation 90 Degrees   Right Shoulder External Rotation 55 Degrees     PROM   Overall PROM Comments Assessed in supine, external and internal rotation with shoulder adducted   PROM Assessment Site Shoulder   Right/Left Shoulder Right   Right Shoulder Flexion 150 Degrees  previous: 145   Right Shoulder ABduction 180 Degrees  previous: same   Right Shoulder Internal Rotation 90 Degrees  previous: same   Right Shoulder External Rotation 70 Degrees  prevous: same     Strength     Overall Strength Comments Assessed seated. IR/er adducted   Strength Assessment Site Shoulder   Right/Left Shoulder Right   Right Shoulder Flexion 3-/5  previous: same   Right Shoulder ABduction 3-/5  previous: same   Right Shoulder Internal Rotation 3/5  previous: same   Right Shoulder External Rotation 3-/5  previuos: same                  OT Treatments/Exercises (OP) - 08/27/16 1055      Exercises   Exercises Shoulder     Shoulder Exercises: Supine   Protraction PROM;5 reps   Horizontal ABduction PROM;5 reps   External Rotation PROM;5 reps   Internal Rotation PROM;5 reps   Flexion PROM;5 reps   ABduction PROM;5 reps     Shoulder Exercises: Sidelying    External Rotation AROM;12 reps   Internal Rotation AROM;12 reps   Flexion AROM;12 reps   ABduction AROM;12 reps   Other Sidelying Exercises Protraction 12X     Modalities   Modalities Electrical Stimulation;Moist Heat     Moist Heat Therapy   Number Minutes Moist Heat 10 Minutes   Moist Heat Location Shoulder     Electrical Stimulation   Electrical Stimulation Location right shoulder   Electrical Stimulation Action interferential   Electrical Stimulation Parameters 9.8 CV   Electrical Stimulation Goals Pain     Manual Therapy   Manual Therapy Myofascial release   Manual therapy comments manual therapy completed seperately from all other interventions this date   Myofascial Release myofascial release and manual stretching to right upper arm, scapular, shoulder region and associated areas to decrease pain and restrictions and imrpove pain free mobility in right shoulder.                    OT Short Term Goals - 08/27/16 1114      OT SHORT TERM GOAL #1   Title Patient will be educated on a HEP for right shoulder ROM and strength.   Time 6   Period Weeks     OT SHORT TERM GOAL #2   Title Patient will improve right shoulder and elbow P/ROM to [facility] in order to complete B/ADLs with increased independence.   Time 6   Period Weeks     OT SHORT TERM GOAL #3   Title Patient will improve right shoulder and elbow strength to 3+/5 for increased ability to pick up baskets of laundry.   Time 6   Period Weeks   Status Not Met     OT SHORT TERM GOAL #4   Title Patient will decrease right shoulder pain to 4/10 or better in order to complete B/ADLs with greater independence.   Time 6   Period Weeks   Status Not Met     OT SHORT TERM GOAL #5   Title Patient will decrease fascial restrictions to moderate in right shoulder region for greater mobility needed for greater use of right arm as dominant.    Time 6   Period Weeks           OT Long Term Goals - 08/27/16 1114       OT LONG TERM GOAL #1   Title Patient will return to prior level of independence with all B/IADLs, work, and leisure activities using her right arm as dominant.    Time 12   Period Weeks   Status Not Met     OT LONG TERM GOAL #2   Title Patient will improve right  shoulder and elbow A/ROM to WNL for greater ability to use right arm as dominant at work and home.    Time 12   Period Weeks   Status Not Met     OT LONG TERM GOAL #3   Title Patient will improve right shoulder and elbow strength to 5/5 for greater ability to lift bags of mulch when completing yardwork.     Time 12   Period Weeks   Status Not Met     OT LONG TERM GOAL #4   Title Patient will decrease right shoulder pain to 2/10 or better when completing functional actiivites.    Time 12   Period Weeks   Status Not Met     OT LONG TERM GOAL #5   Title Paitent will decrease right fascial restrictions in right shoulder in order to have greater mobility needed for use of right arm with functional activiites.     Time 12   Period Weeks   Status Not Met               Plan - 08/27/16 1112    Clinical Impression Statement A: Pt to be discharged today due to planned surgery. pt will see the PA on wednesday to schedule. Session focused on pain management and gentle exercises. Measurements were taken and there was little to no change from they were taken earlier this month.   Plan P: D/C from therapy and resume after surgery when appropriate.      Patient will benefit from skilled therapeutic intervention in order to improve the following deficits and impairments:  Decreased range of motion, Decreased skin integrity, Decreased scar mobility, Decreased strength, Increased muscle spasms, Increased fascial restricitons, Impaired UE functional use, Pain  Visit Diagnosis: Stiffness of right shoulder, not elsewhere classified  Other symptoms and signs involving the musculoskeletal system  Right shoulder pain,  unspecified chronicity    Problem List Patient Active Problem List   Diagnosis Date Noted  . Epigastric pain 08/11/2015  . Chest pain, rule out acute myocardial infarction 08/11/2015  . Renal insufficiency 08/11/2015  . Venous stasis 04/13/2015  . Depression 04/13/2015  . Essential hypertension 09/12/2014  . PVCs (premature ventricular contractions) 09/07/2014  . Precordial pain 09/07/2014    OCCUPATIONAL THERAPY DISCHARGE SUMMARY  Visits from Start of Care: 35  Current functional level related to goals / functional outcomes: See above   Remaining deficits: Decreased strength and A/ROM and increased fascial restrictions and pain with all activities. Patient has limited functional use of RUE during daily tasks.   Education / Equipment: A/ROM exercises and task modifications Plan: Patient agrees to discharge.  Patient goals were not met. Patient is being discharged due to                                                     ?????patient returning to MD to have additional shoulder surgery. Pt will return when appropriate.          Laura Essenmacher, OTR/L,CBIS  [phone]  08/27/2016, 11:30 AM  [facility] [address] , [place], [address] Phone: [phone]   Fax:  [phone]  Name: Jennifer Coleman MRN: [id number] Date of Birth: 09/14/1951  

## 2016-08-29 ENCOUNTER — Encounter (INDEPENDENT_AMBULATORY_CARE_PROVIDER_SITE_OTHER): Payer: Self-pay | Admitting: Orthopedic Surgery

## 2016-08-29 ENCOUNTER — Ambulatory Visit (INDEPENDENT_AMBULATORY_CARE_PROVIDER_SITE_OTHER): Payer: 59 | Admitting: Orthopedic Surgery

## 2016-08-29 DIAGNOSIS — M25511 Pain in right shoulder: Secondary | ICD-10-CM | POA: Diagnosis not present

## 2016-08-29 MED ORDER — TRAMADOL HCL 50 MG PO TABS: 50.0000 mg | ORAL_TABLET | Freq: Four times a day (QID) | ORAL | 0 refills | Status: DC | PRN

## 2016-08-29 NOTE — Progress Notes (Signed)
Office Visit Note   Patient: Jennifer Coleman           Date of Birth: 17-Nov-1950           MRN: [id number] Visit Date: 08/29/2016              Requested by: Mikey Kirschner, MD [address] [place], [place] [address] PCP: Mickie Hillier, MD   Assessment & Plan: Visit Diagnoses:  1. Shoulder joint painful on movement, right     Plan: At this time apparently she is already discuss possible surgery with Dr. Durward Fortes. She comes in today requesting her surgery to the right shoulder. We will take and the appropriate forms to schedule an arthroscopic right shoulder evaluation and possible revision rotator cuff repair with possible dermaspan patch.  Follow-Up Instructions: Return if symptoms worsen or fail to improve.   Orders:  No orders of the defined types were placed in this encounter.  Meds ordered this encounter  Medications  . traMADol (ULTRAM) 50 MG tablet    Sig: Take 1 tablet (50 mg total) by mouth every 6 (six) hours as needed.    Dispense:  30 tablet    Refill:  0      Procedures: No procedures performed   Clinical Data: No additional findings.   Subjective: Chief Complaint  Patient presents with  . Right Shoulder - Follow-up    Pain tone is a very pleasant 65 year old white female seen today for evaluation of her right shoulder. She has had a previous arthroscopic subacromial decompression distal clavicle excision with mini open rotator cuff repair of 2017. History was in March of this year she had wheelbarrow full of mulch which was started turning over so wrestled back. She had a rotator cuff repair as noted above started have some symptoms. Cortisone injection was given only helped for about 2 days according to her. I scan was ordered  MRI scan was ordered and completed on 08/14/2016 this revealed ectopic rotator cuff screw is out of the bone and appears to penetrate through spinatus tendon. This is likely backed out abnormal fluid in the subacromial  subdeltoid bursa which may be tracking along the screw. This also tracks into the adjacent posterior deltoid muscle. There is also severe thinning of the supraspinatus tendon with considerable supraspinatus and infraspinatus tendinopathy. Prior acromioplasty noted. General chondral thinning in the glenohumeral joint. Had a filling defects in the axilla axilla pouch these may represent metal fragments were small chondral fragments or debris. Noted superior labrum.  She states that her pain is getting worse and she would like to consider surgery.    Review of Systems  Genitourinary:       Renal insufficiency  Psychiatric/Behavioral:       Depression     Objective: Vital Signs: BP 128/72   Pulse (!) 58   Resp 14   Ht '5\' 6"'$  (1.676 m)   Wt 220 lb (99.8 kg)   BMI 35.51 kg/m   Physical Exam  Constitutional: She is oriented to person, place, and time. She appears well-developed and well-nourished.  HENT:  Head: Normocephalic and atraumatic.  Eyes: EOM are normal. Pupils are equal, round, and reactive to light.  Neck: Neck supple.  No carotid bruits  Cardiovascular: Normal rate.   Pulmonary/Chest: Effort normal.  Neurological: She is alert and oriented to person, place, and time.  Skin: Skin is warm and dry.  Psychiatric: She has a normal mood and affect. Her behavior is normal.  Judgment and thought content normal.    Right Shoulder Exam   Range of Motion  Active Abduction: 60  Passive Abduction: 90  Forward Flexion: 140  External Rotation: 40  Internal Rotation 90 degrees: 40   Muscle Strength  Abduction: 3/5  Internal Rotation: 4/5  External Rotation: 3/5  Supraspinatus: 3/5   Tests  Apprehension: positive  Other  Sensation: normal Pulse: present      Specialty Comments:  No specialty comments available.  Imaging: No results found.   PMFS History: Patient Active Problem List   Diagnosis Date Noted  . Shoulder joint painful on movement, right  08/29/2016  . Epigastric pain 08/11/2015  . Chest pain, rule out acute myocardial infarction 08/11/2015  . Renal insufficiency 08/11/2015  . Venous stasis 04/13/2015  . Depression 04/13/2015  . Essential hypertension 09/12/2014  . PVCs (premature ventricular contractions) 09/07/2014  . Precordial pain 09/07/2014   Past Medical History:  Diagnosis Date  . Depression   . Family history of anesthesia complication 25 yrs ago   Sister allergic to sccinocholine - unable to come off ventilator  . Hypertension   . PONV (postoperative nausea and vomiting)   . Shingles   . Varicose veins     Family History  Problem Relation Age of Onset  . Heart disease Sister   . Heart disease Brother     48's  . Diabetes Mellitus II Sister   . Multiple myeloma Mother   . Hypertension Sister     Past Surgical History:  Procedure Laterality Date  . CARPAL TUNNEL RELEASE    . CERVICAL DISC SURGERY  1992   Fusion  . COLONOSCOPY N/A 06/07/2016   Procedure: COLONOSCOPY;  Surgeon: Rogene Houston, MD;  [facility];  Service: Endoscopy;  Laterality: N/A;  1:00  . KNEE ARTHROSCOPY  09/16/2012   Procedure: ARTHROSCOPY KNEE;  Surgeon: [name], MD;  Location: [facility];  Service: Orthopedics;  Laterality: Right;  Right Knee Arthroscopy  . SALPINGOOPHORECTOMY Left 1985  . SHOULDER ARTHROSCOPY WITH OPEN ROTATOR CUFF REPAIR AND DISTAL CLAVICLE ACROMINECTOMY Right 05/03/2016   Procedure: RIGHT SHOULDER ARTHROSCOPY WITH MINI-OPEN ROTATOR CUFF REPAIR,DISTAL CLAVICLE RESECTION, SUBACROMIAL DECOMPRESSION.;  Surgeon: [name], MD;  Location: [facility];  Service: Orthopedics;  Laterality: Right;   Social History   Occupational History  . Not on file.   Social History Main Topics  . Smoking status: Former Smoker    Types: Cigarettes  . Smokeless tobacco: Former Systems developer    Quit date: 01/12/1997  . Alcohol use No  . Drug use: No  . Sexual activity: Yes

## 2016-09-05 ENCOUNTER — Telehealth (INDEPENDENT_AMBULATORY_CARE_PROVIDER_SITE_OTHER): Payer: Self-pay | Admitting: Orthopaedic Surgery

## 2016-09-06 ENCOUNTER — Telehealth (INDEPENDENT_AMBULATORY_CARE_PROVIDER_SITE_OTHER): Payer: Self-pay | Admitting: Orthopaedic Surgery

## 2016-09-06 NOTE — Telephone Encounter (Signed)
Please advise, do we want to do prior to surgery and also estimated out of work time?

## 2016-09-06 NOTE — Telephone Encounter (Signed)
Would be out of work for 8 weeks after surgery

## 2016-09-07 NOTE — Telephone Encounter (Signed)
Spoke with pt

## 2016-09-11 ENCOUNTER — Encounter (INDEPENDENT_AMBULATORY_CARE_PROVIDER_SITE_OTHER): Payer: Self-pay

## 2016-09-11 ENCOUNTER — Other Ambulatory Visit (INDEPENDENT_AMBULATORY_CARE_PROVIDER_SITE_OTHER): Payer: Self-pay | Admitting: Orthopedic Surgery

## 2016-09-11 ENCOUNTER — Ambulatory Visit (INDEPENDENT_AMBULATORY_CARE_PROVIDER_SITE_OTHER): Payer: Self-pay | Admitting: Orthopedic Surgery

## 2016-09-11 NOTE — H&P (Signed)
Jennifer Fears, MD   Biagio Borg, PA-C [address] [place], [place]  [address]                             [phone]   [place] MRN:  [id number] DOB/SEX:  1951/06/13/female  CHIEF COMPLAINT:  Painful right shoulder  HISTORY:Jennifer Coleman is about 3-1/2 months post right shoulder surgery as previously identified.  This included an arthroscopic SAD, DCR and a mini-open rotator cuff tear repair, and a biceps tenodesis.  I was impressed that her bones were very soft and had some trouble inserting some of the anchors.  She has had a subsequent bone density study that revealed some osteopenia that is being followed by her primary care physician.  She did have full-thickness tears of the supraspinatus with retraction.  A mini-open rotator cuff tear was performed.  The biceps tendon was tenodesed to its inferior groove with a Biomet anchor with attached Fiberwire.  The rotator cuff involved both inferior and supraspinatus with retraction.  I was able to release the adhesion and to advance the tendons to the articular surface of the humeral head and used 2 further anchors to suture the tendon and used a Quattro anchor as a second row repair for the infraspinatus.  Per my note, had a very nice repair and stable anchors.  She has just really had a very difficult time postoperatively.  She initially had some pain after several months in the subacromial region posteriorly.  I injected that.  She is really not sure it made a difference.  Now she is having more pain anteriorly.  The MRI scan was performed with an arthrogram.  There was severe thinning of the supra- and infraspinatus tendons, but there was no obvious tear.  One of the rotator cuff screws appears to have penetrated the infraspinatus tendon and pulled out of the bone.   PAST MEDICAL HISTORY: Patient Active Problem List   Diagnosis Date Noted  . Shoulder joint painful on movement, right 08/29/2016  .  Epigastric pain 08/11/2015  . Chest pain, rule out acute myocardial infarction 08/11/2015  . Renal insufficiency 08/11/2015  . Venous stasis 04/13/2015  . Depression 04/13/2015  . Essential hypertension 09/12/2014  . PVCs (premature ventricular contractions) 09/07/2014  . Precordial pain 09/07/2014   Past Medical History:  Diagnosis Date  . Depression   . Family history of anesthesia complication 25 yrs ago   Sister allergic to sccinocholine - unable to come off ventilator  . Hypertension   . PONV (postoperative nausea and vomiting)   . Shingles   . Varicose veins    Past Surgical History:  Procedure Laterality Date  . CARPAL TUNNEL RELEASE    . CERVICAL DISC SURGERY  1992   Fusion  . COLONOSCOPY N/A 06/07/2016   Procedure: COLONOSCOPY;  Surgeon: Rogene Houston, MD;  [facility];  Service: Endoscopy;  Laterality: N/A;  1:00  . KNEE ARTHROSCOPY  09/16/2012   Procedure: ARTHROSCOPY KNEE;  Surgeon: [name], MD;  Location: [facility];  Service: Orthopedics;  Laterality: Right;  Right Knee Arthroscopy  . SALPINGOOPHORECTOMY Left 1985  . SHOULDER ARTHROSCOPY WITH OPEN ROTATOR CUFF REPAIR AND DISTAL CLAVICLE ACROMINECTOMY Right 05/03/2016   Procedure: RIGHT SHOULDER ARTHROSCOPY WITH MINI-OPEN ROTATOR CUFF REPAIR,DISTAL CLAVICLE RESECTION, SUBACROMIAL DECOMPRESSION.;  Surgeon: [name], MD;  Location: [place];  Service: Orthopedics;  Laterality: Right;  MEDICATIONS:   (Not in a hospital admission)  ALLERGIES:   Allergies  Allergen Reactions  . Penicillins Shortness Of Breath, Swelling and Rash  . Shellfish Allergy Shortness Of Breath and Swelling  . Morphine And Related Nausea And Vomiting  . Codeine Itching    REVIEW OF SYSTEMS:  Pertinent items noted in HPI and remainder of comprehensive ROS otherwise negative.   FAMILY HISTORY:   Family History  Problem Relation Age of Onset  . Heart disease Sister   . Heart disease Brother      80's  . Diabetes Mellitus II Sister   . Multiple myeloma Mother   . Hypertension Sister     SOCIAL HISTORY:   Social History  Substance Use Topics  . Smoking status: Former Smoker    Types: Cigarettes  . Smokeless tobacco: Former Systems developer    Quit date: 01/12/1997  . Alcohol use No      EXAMINATION: Vital signs in last 24 hours: _0 @  Head is normocephalic.   Eyes:  Pupils equal, round and reactive to light and accommodation.  Extraocular intact. ENT: Ears, nose, and throat were benign.   Neck: supple, no bruits were noted.   Chest: good expansion.   Lungs: essentially clear.   Cardiac: regular rhythm and rate, normal S1, S2.  No murmurs appreciated. Pulses :  2+ bilateral and symmetric in upper extremities. Abdomen is scaphoid, soft, nontender, no masses palpable, normal bowel sounds present. CNS:  He is oriented x3 and cranial nerves II-XII grossly intact. Breast, rectal, and genital exams: not performed and not indicated for an orthopedic evaluation. Musculoskeletal:She is tender directly over the biceps groove, but had full passive motion of both abduction and flexion.  Neurovascular exam was intact.  Elbow extension was a little bit tight    IThe MRI scan was performed with an arthrogram.  There was severe thinning of the supra- and infraspinatus tendons, but there was no obvious tear.  One of the rotator cuff screws appears to have penetrated the infraspinatus tendon and pulled out of the bone.   Imaging Review The MRI scan was performed with an arthrogram.  There was severe thinning of the supra- and infraspinatus tendons, but there was no obvious tear.  One of the rotator cuff screws appears to have penetrated the infraspinatus tendon and pulled out of the bone.   1. Ectopic rotator cuff screw is out of the bone and appears to penetrate through the infraspinatus tendon. This has likely backed out. There is abnormal fluid in the subacromial subdeltoid bursa which  may be tracking along this screw. This also tracks into the adjacent posterior deltoid muscle. 2. There is also severe thinning of the supraspinatus tendon with considerable supraspinatus and infraspinatus tendinopathy. 3. Suspected biceps tenodesis. 4. Prior acromioplasty and likely Mumford procedure. 5. Mild degenerative chondral thinning in the glenohumeral joint. 6. Several tiny filling defects in the axillae pouch. Some of these may represent tiny metal fragments or small chondral fragments, or debris. 7. Blunted superior labrum, query partial resection.   ASSESSMENT: right rotator cuff tendonopathy, hardware failure   Past Medical History:  Diagnosis Date  . Depression   . Family history of anesthesia complication 25 yrs ago   Sister allergic to sccinocholine - unable to come off ventilator  . Hypertension   . PONV (postoperative nausea and vomiting)   . Shingles   . Varicose veins     PLAN: Plan for right repeat the arthroscopy and then consider exploration of the  rotator cuff And possible repair and possible [facility] patch  The procedure,  risks, and benefits of surgery were presented and reviewed. The risks including but not limited to infection, blood clots, vascular and nerve injury, stiffness,  among others were discussed. The patient acknowledged the explanation, agreed to proceed.   Mike Craze [place], [place] [phone]  09/11/2016 6:46 PM

## 2016-09-13 NOTE — Telephone Encounter (Signed)
Pt. Scheduled

## 2016-09-14 ENCOUNTER — Telehealth (INDEPENDENT_AMBULATORY_CARE_PROVIDER_SITE_OTHER): Payer: Self-pay | Admitting: Orthopaedic Surgery

## 2016-09-14 NOTE — Telephone Encounter (Signed)
Jennifer Coleman w/[employer] disability [phone] states he faxed Jennifer Coleman a letter on November 7th and wants to know if Dr. Durward Fortes received it. The letter is asking if this patient can return to work full time or light duty. Jennifer Coleman will be re-faxing letter.

## 2016-09-17 NOTE — H&P (Addendum)
Joni Fears, MD   Biagio Borg, PA-C [address], [place], [place]  [address]                             [phone]   [place] MRN:  [id number] DOB/SEX:  [birth year]/female  CHIEF COMPLAINT:  Painful right shoulder  HISTORY:Jennifer Coleman is about 3-1/2 months post right shoulder surgery as previously identified. This included an arthroscopic SAD, DCR and a mini-open rotator cuff tear repair, and a biceps tenodesis. I was impressed that her bones were very soft and had some trouble inserting some of the anchors. She has had a subsequent bone density study that revealed some osteopenia that is being followed by her primary care physician. She did have full-thickness tears of the supraspinatus with retraction. A mini-open rotator cuff tear was performed. The biceps tendon was tenodesed to its inferior groove witha Biomet anchor with attached Fiberwire. The rotator cuff involved both inferior and supraspinatus with retraction. I was able to release the adhesion and to advance the tendons to the articular surface of the humeral head and used 2 further anchors to suture the tendon and used a Quattro anchor as a second row repair for the infraspinatus. Per my note, had a very nice repair and stable anchors. She has just really had a very difficult time postoperatively. She initially had some pain after several months in the subacromial region posteriorly. I injected that. She is really not sure it made a difference. Now she is having more pain anteriorly.  The MRI scan was performed with an arthrogram. There was severe thinning of the supra- and infraspinatus tendons, but there was no obvious tear. One of the rotator cuff screws appears to have penetrated the infraspinatus tendon and pulled out of the bone.  PAST MEDICAL HISTORY:     Patient Active Problem List   Diagnosis Date Noted  . Shoulder joint painful on movement, right 08/29/2016   . Epigastric pain 08/11/2015  . Chest pain, rule out acute myocardial infarction 08/11/2015  . Renal insufficiency 08/11/2015  . Venous stasis 04/13/2015  . Depression 04/13/2015  . Essential hypertension 09/12/2014  . PVCs (premature ventricular contractions) 09/07/2014  . Precordial pain 09/07/2014       Past Medical History:  Diagnosis Date  . Depression   . Family history of anesthesia complication 25 yrs ago   Sister allergic to sccinocholine - unable to come off ventilator  . Hypertension   . PONV (postoperative nausea and vomiting)   . Shingles   . Varicose veins         Past Surgical History:  Procedure Laterality Date  . CARPAL TUNNEL RELEASE    . CERVICAL DISC SURGERY  1992   Fusion  . COLONOSCOPY N/A 06/07/2016   Procedure: COLONOSCOPY;  Surgeon: Rogene Houston, MD;  [facility];  Service: Endoscopy;  Laterality: N/A;  1:00  . KNEE ARTHROSCOPY  09/16/2012   Procedure: ARTHROSCOPY KNEE;  Surgeon: [name], MD;  Location: [facility];  Service: Orthopedics;  Laterality: Right;  Right Knee Arthroscopy  . SALPINGOOPHORECTOMY Left 1985  . SHOULDER ARTHROSCOPY WITH OPEN ROTATOR CUFF REPAIR AND DISTAL CLAVICLE ACROMINECTOMY Right 05/03/2016   Procedure: RIGHT SHOULDER ARTHROSCOPY WITH MINI-OPEN ROTATOR CUFF REPAIR,DISTAL CLAVICLE RESECTION, SUBACROMIAL DECOMPRESSION.;  Surgeon: [name], MD;  Location: [place];  Service: Orthopedics;  Laterality: Right;     MEDICATIONS:  Current Facility-Administered Medications  Medication Dose Route Frequency Provider Last Rate Last Dose  . vancomycin (VANCOCIN) 1,500 mg in sodium chloride 0.9 % 500 mL IVPB  1,500 mg Intravenous On Call to [place], MD       Current Outpatient Prescriptions  Medication Sig Dispense Refill  . Calcium Carbonate-Vit D-Min (GNP CALCIUM PLUS 600 +D PO) Take 1 tablet by mouth 2 (two) times daily.    . cetirizine (ZYRTEC) 10 MG tablet  Take 10 mg by mouth daily as needed for allergies.     . Coenzyme Q10 (COQ-10) 200 MG CAPS Take 200 mg by mouth daily after lunch.    . diclofenac sodium (VOLTAREN) 1 % GEL Apply to right knee 3 times a day as needed. (Patient taking differently: Apply to right knee 3 times a day as needed for muscular pain) 1 Tube 1  . enalapril (VASOTEC) 20 MG tablet TAKE 1 TABLET BY MOUTH TWICE A DAY. 180 tablet 1  . escitalopram (LEXAPRO) 20 MG tablet Take 1 tablet (20 mg total) by mouth daily. (Patient taking differently: Take 10 mg by mouth 2 (two) times daily. ) 90 tablet 1  . Estradiol-Norethindrone Acet 0.5-0.1 MG tablet Take 1 tablet by mouth every evening.   0  . fluticasone (FLONASE) 50 MCG/ACT nasal spray 2 sprays as needed for allergies or rhinitis.    . hydrochlorothiazide (HYDRODIURIL) 25 MG tablet TAKE 1 TABLET BY MOUTH DAILY. 30 tablet 5  . ibuprofen (ADVIL,MOTRIN) 800 MG tablet Take 1 tablet (800 mg total) by mouth 3 (three) times daily as needed. (Patient taking differently: Take 800 mg by mouth 3 (three) times daily as needed (for pain.). ) 90 tablet 3  . Multiple Vitamins-Minerals (MULTIVITAMINS THER. W/MINERALS) TABS Take 1 tablet by mouth daily.    Marland Kitchen tiZANidine (ZANAFLEX) 4 MG tablet Take 4 mg by mouth 2 (two) times daily as needed for spasms.  0  . traMADol (ULTRAM) 50 MG tablet Take 1 tablet (50 mg total) by mouth every 6 (six) hours as needed. (Patient taking differently: Take 50 mg by mouth every 6 (six) hours as needed (for pain.). ) 30 tablet 0  . ciprofloxacin (CIPRO) 500 MG tablet Take 1 tablet (500 mg total) by mouth 2 (two) times daily. (Patient not taking: Reported on 09/19/2016) 10 tablet 0     ALLERGIES:       Allergies  Allergen Reactions  . Penicillins Shortness Of Breath, Swelling and Rash  . Shellfish Allergy Shortness Of Breath and Swelling  . Morphine And Related Nausea And Vomiting  . Codeine Itching    REVIEW OF SYSTEMS:  Pertinent items noted in HPI and  remainder of comprehensive ROS otherwise negative.   FAMILY HISTORY:         Family History  Problem Relation Age of Onset  . Heart disease Sister   . Heart disease Brother     72's  . Diabetes Mellitus II Sister   . Multiple myeloma Mother   . Hypertension Sister     SOCIAL HISTORY:        Social History  Substance Use Topics  . Smoking status: Former Smoker    Types: Cigarettes  . Smokeless tobacco: Former Systems developer    Quit date: 01/12/1997  . Alcohol use No      EXAMINATION: Vital signs in last 24 hours: _0 @  Head is normocephalic.  Eyes: Pupils equal, round and reactive to light and accommodation. Extraocular intact. ENT: Ears, nose, and throat  were benign.  Neck: supple, no bruits were noted.  Chest: good expansion.  Lungs: essentially clear.  Cardiac: regular rhythm and rate, normal S1, S2. No murmurs appreciated. Pulses :  2+ bilateral and symmetric in upper extremities. Abdomen is scaphoid, soft, nontender, no masses palpable, normal bowel sounds present. CNS: He is oriented x3 and cranial nerves II-XII grossly intact. Breast, rectal,and genital exams: not performed and not indicated for an orthopedic evaluation. Musculoskeletal:She is tender directly over the biceps groove, but had full passive motion of both abduction and flexion. Neurovascular exam was intact. Elbow extension was a little bit tight    IThe MRI scan was performed with an arthrogram. There was severe thinning of the supra- and infraspinatus tendons, but there was no obvious tear. One of the rotator cuff screws appears to have penetrated the infraspinatus tendon and pulled out of the bone.  Imaging Review The MRI scan was performed with an arthrogram. There was severe thinning of the supra- and infraspinatus tendons, but there was no obvious tear. One of the rotator cuff screws appears to have penetrated the infraspinatus tendon and pulled out of the  bone.  1. Ectopic rotator cuff screw is out of the bone and appears to penetrate through the infraspinatus tendon. This has likely backed out. There is abnormal fluid in the subacromial subdeltoid bursa which may be tracking along this screw. This also tracks into the adjacent posterior deltoid muscle. 2. There is also severe thinning of the supraspinatus tendon with considerable supraspinatus and infraspinatus tendinopathy. 3. Suspected biceps tenodesis. 4. Prior acromioplasty and likely Mumford procedure. 5. Mild degenerative chondral thinning in the glenohumeral joint. 6. Several tiny filling defects in the axillae pouch. Some of these may represent tiny metal fragments or small chondral fragments, or debris. 7. Blunted superior labrum, query partial resection.   ASSESSMENT: right rotator cuff tendonopathy, hardware failure             Past Medical History:  Diagnosis Date  . Depression   . Family history of anesthesia complication 25 yrs ago   Sister allergic to sccinocholine - unable to come off ventilator  . Hypertension   . PONV (postoperative nausea and vomiting)   . Shingles   . Varicose veins     PLAN: Plan for right repeat the arthroscopy and then consider exploration of the rotator cuff And possible repair and possible [facility] patch  The procedure,  risks, and benefits of surgery were presented and reviewed. The risks including but not limited to infection, blood clots, vascular and nerve injury, stiffness,  among others were discussed. The patient acknowledged the explanation, agreed to proceed.   Mike Craze Mariane Masters [employer] [phone]

## 2016-09-18 NOTE — Telephone Encounter (Signed)
Spoke with Jennifer Coleman and he will refax letter.

## 2016-09-21 ENCOUNTER — Other Ambulatory Visit (HOSPITAL_COMMUNITY): Payer: 59

## 2016-09-21 ENCOUNTER — Encounter (HOSPITAL_COMMUNITY): Payer: Self-pay

## 2016-09-21 ENCOUNTER — Encounter (HOSPITAL_COMMUNITY)
Admission: RE | Admit: 2016-09-21 | Discharge: 2016-09-21 | Disposition: A | Discharge status: Home / Self Care | Payer: 59 | Source: Ambulatory Visit | Attending: Orthopaedic Surgery | Admitting: Orthopaedic Surgery

## 2016-09-21 ENCOUNTER — Other Ambulatory Visit (HOSPITAL_COMMUNITY): Payer: Self-pay | Admitting: *Deleted

## 2016-09-21 DIAGNOSIS — R9431 Abnormal electrocardiogram [ECG] [EKG]: Secondary | ICD-10-CM | POA: Insufficient documentation

## 2016-09-21 DIAGNOSIS — Z01818 Encounter for other preprocedural examination: Secondary | ICD-10-CM | POA: Diagnosis not present

## 2016-09-21 DIAGNOSIS — Z01812 Encounter for preprocedural laboratory examination: Secondary | ICD-10-CM | POA: Insufficient documentation

## 2016-09-21 DIAGNOSIS — M75101 Unspecified rotator cuff tear or rupture of right shoulder, not specified as traumatic: Secondary | ICD-10-CM | POA: Diagnosis not present

## 2016-09-21 DIAGNOSIS — I4510 Unspecified right bundle-branch block: Secondary | ICD-10-CM | POA: Diagnosis not present

## 2016-09-21 DIAGNOSIS — I10 Essential (primary) hypertension: Secondary | ICD-10-CM | POA: Insufficient documentation

## 2016-09-21 HISTORY — DX: Unspecified osteoarthritis, unspecified site: M19.90

## 2016-09-21 HISTORY — DX: Pneumonia, unspecified organism: J18.9

## 2016-09-21 HISTORY — DX: Asymptomatic varicose veins of left lower extremity: I83.92

## 2016-09-21 HISTORY — DX: Anxiety disorder, unspecified: F41.9

## 2016-09-21 HISTORY — DX: Ventricular premature depolarization: I49.3

## 2016-09-21 LAB — BASIC METABOLIC PANEL
ANION GAP: 7 (ref 5–15)
BUN: 22 mg/dL — ABNORMAL HIGH (ref 6–20)
CHLORIDE: 103 mmol/L (ref 101–111)
CO2: 29 mmol/L (ref 22–32)
Calcium: 10.3 mg/dL (ref 8.9–10.3)
Creatinine, Ser: 0.98 mg/dL (ref 0.44–1.00)
GFR calc Af Amer: >60 mL/min (ref >60)
GFR calc non Af Amer: >60 mL/min (ref >60)
GLUCOSE: 84 mg/dL (ref 65–99)
POTASSIUM: 3.9 mmol/L (ref 3.5–5.1)
Sodium: 139 mmol/L (ref 135–145)

## 2016-09-21 LAB — CBC
HEMATOCRIT: 42.9 % (ref 36.0–46.0)
HEMOGLOBIN: 14.4 g/dL (ref 12.0–15.0)
MCH: 31.8 pg (ref 26.0–34.0)
MCHC: 33.6 g/dL (ref 30.0–36.0)
MCV: 94.7 fL (ref 78.0–100.0)
Platelets: 262 10*3/uL (ref 150–400)
RBC: 4.53 MIL/uL (ref 3.87–5.11)
RDW: 12.7 % (ref 11.5–15.5)
WBC: 8 10*3/uL (ref 4.0–10.5)

## 2016-09-21 NOTE — Progress Notes (Signed)
Pt denies cardiac history except for PVC's which she controls by limiting caffeine. Pt denies any recent chest pain or sob. Pt is not diabetic.

## 2016-09-21 NOTE — Pre-Procedure Instructions (Signed)
[name]  09/21/2016     Your procedure is scheduled on Tuesday, September 25, 2016 at 1:45 PM.   Report to [facility] Entrance "A" Admitting Office at 11:45 AM.   Call this number if you have problems the morning of surgery: [phone]   Questions prior to day of surgery, please call [phone] between 8 & 4 PM.   Remember:  Do not eat food or drink liquids after midnight Monday 09/24/16.  Take these medicines the morning of surgery with A SIP OF WATER: Escitalopram (Lexapro), Cetirizine (Zyrtec) - if needed, Tramadol - if needed.  Stop NSAIDS (Ibuprofen, Aleve, etc.), CoQ-10 and Multivitamins as of today.   Do not wear jewelry, make-up or nail polish.  Do not wear lotions, powders, perfumes, or deodorant.  Do not shave 48 hours prior to surgery.    Do not bring valuables to the hospital.  [facility] is not responsible for any belongings or valuables.  Contacts, dentures or bridgework may not be worn into surgery.  Leave your suitcase in the car.  After surgery it may be brought to your room.  For patients admitted to the hospital, discharge time will be determined by your treatment team.  Patients discharged the day of surgery will not be allowed to drive home.   Special instructions:  [facility] - Preparing for Surgery  Before surgery, you can play an important role.  Because skin is not sterile, your skin needs to be as free of germs as possible.  You can reduce the number of germs on you skin by washing with CHG (chlorahexidine gluconate) soap before surgery.  CHG is an antiseptic cleaner which kills germs and bonds with the skin to continue killing germs even after washing.  Please DO NOT use if you have an allergy to CHG or antibacterial soaps.  If your skin becomes reddened/irritated stop using the CHG and inform your nurse when you arrive at Short Stay.  Do not shave (including legs and underarms) for at least 48 hours prior to the first CHG shower.   You may shave your face.  Please follow these instructions carefully:   1.  Shower with CHG Soap the night before surgery and the                    morning of Surgery.  2.  If you choose to wash your hair, wash your hair first as usual with your       normal shampoo.  3.  After you shampoo, rinse your hair and body thoroughly to remove the shampoo.  4.  Use CHG as you would any other liquid soap.  You can apply chg directly       to the skin and wash gently with scrungie or a clean washcloth.  5.  Apply the CHG Soap to your body ONLY FROM THE NECK DOWN.        Do not use on open wounds or open sores.  Avoid contact with your eyes, ears, mouth and genitals (private parts).  Wash genitals (private parts) with your normal soap.  6.  Wash thoroughly, paying special attention to the area where your surgery        will be performed.  7.  Thoroughly rinse your body with warm water from the neck down.  8.  DO NOT shower/wash with your normal soap after using and rinsing off       the CHG Soap.  9.  Pat yourself dry with a clean towel.            10.  Wear clean pajamas.            11.  Place clean sheets on your bed the night of your first shower and do not        sleep with pets.  Day of Surgery  Do not apply any lotions/deodorants the morning of surgery.  Please wear clean clothes to the hospital.   Please read over the fact sheets that you were given.

## 2016-09-24 MED ORDER — VANCOMYCIN HCL 10 G IV SOLR
1500.0000 mg | INTRAVENOUS | Status: AC
  Administered 2016-09-25: 1500 mg via INTRAVENOUS
  Filled 2016-09-24: qty 1500

## 2016-09-25 ENCOUNTER — Encounter (HOSPITAL_COMMUNITY)
Admission: RE | Disposition: A | Discharge status: Home / Self Care | Payer: Self-pay | Source: Ambulatory Visit | Attending: Orthopaedic Surgery

## 2016-09-25 ENCOUNTER — Encounter (HOSPITAL_COMMUNITY): Payer: Self-pay | Admitting: Anesthesiology

## 2016-09-25 ENCOUNTER — Ambulatory Visit (HOSPITAL_COMMUNITY): Payer: 59 | Admitting: Emergency Medicine

## 2016-09-25 ENCOUNTER — Ambulatory Visit (HOSPITAL_COMMUNITY)
Admission: RE | Admit: 2016-09-25 | Discharge: 2016-09-25 | Disposition: A | Discharge status: Home / Self Care | Payer: 59 | Source: Ambulatory Visit | Attending: Orthopaedic Surgery | Admitting: Orthopaedic Surgery

## 2016-09-25 ENCOUNTER — Ambulatory Visit (HOSPITAL_COMMUNITY): Payer: 59 | Admitting: Certified Registered"

## 2016-09-25 DIAGNOSIS — M75111 Incomplete rotator cuff tear or rupture of right shoulder, not specified as traumatic: Secondary | ICD-10-CM | POA: Diagnosis present

## 2016-09-25 DIAGNOSIS — I10 Essential (primary) hypertension: Secondary | ICD-10-CM | POA: Insufficient documentation

## 2016-09-25 DIAGNOSIS — I739 Peripheral vascular disease, unspecified: Secondary | ICD-10-CM | POA: Diagnosis not present

## 2016-09-25 DIAGNOSIS — I878 Other specified disorders of veins: Secondary | ICD-10-CM | POA: Diagnosis not present

## 2016-09-25 DIAGNOSIS — Z885 Allergy status to narcotic agent status: Secondary | ICD-10-CM | POA: Insufficient documentation

## 2016-09-25 DIAGNOSIS — Z8249 Family history of ischemic heart disease and other diseases of the circulatory system: Secondary | ICD-10-CM | POA: Insufficient documentation

## 2016-09-25 DIAGNOSIS — F419 Anxiety disorder, unspecified: Secondary | ICD-10-CM | POA: Diagnosis not present

## 2016-09-25 DIAGNOSIS — Z87891 Personal history of nicotine dependence: Secondary | ICD-10-CM | POA: Diagnosis not present

## 2016-09-25 DIAGNOSIS — M65811 Other synovitis and tenosynovitis, right shoulder: Secondary | ICD-10-CM | POA: Insufficient documentation

## 2016-09-25 DIAGNOSIS — M1990 Unspecified osteoarthritis, unspecified site: Secondary | ICD-10-CM | POA: Insufficient documentation

## 2016-09-25 DIAGNOSIS — Z880 Allergy status to penicillin: Secondary | ICD-10-CM | POA: Diagnosis not present

## 2016-09-25 DIAGNOSIS — F329 Major depressive disorder, single episode, unspecified: Secondary | ICD-10-CM | POA: Diagnosis not present

## 2016-09-25 DIAGNOSIS — I493 Ventricular premature depolarization: Secondary | ICD-10-CM | POA: Diagnosis not present

## 2016-09-25 DIAGNOSIS — S40259A Superficial foreign body of unspecified shoulder, initial encounter: Secondary | ICD-10-CM | POA: Diagnosis present

## 2016-09-25 DIAGNOSIS — Z91013 Allergy to seafood: Secondary | ICD-10-CM | POA: Diagnosis not present

## 2016-09-25 DIAGNOSIS — Z6841 Body Mass Index (BMI) 40.0 and over, adult: Secondary | ICD-10-CM | POA: Insufficient documentation

## 2016-09-25 DIAGNOSIS — M75101 Unspecified rotator cuff tear or rupture of right shoulder, not specified as traumatic: Secondary | ICD-10-CM | POA: Diagnosis not present

## 2016-09-25 DIAGNOSIS — M7501 Adhesive capsulitis of right shoulder: Secondary | ICD-10-CM | POA: Diagnosis not present

## 2016-09-25 DIAGNOSIS — G8918 Other acute postprocedural pain: Secondary | ICD-10-CM | POA: Diagnosis not present

## 2016-09-25 HISTORY — PX: SHOULDER ARTHROSCOPY WITH ROTATOR CUFF REPAIR: SHX5685

## 2016-09-25 SURGERY — ARTHROSCOPY, SHOULDER, WITH ROTATOR CUFF REPAIR
Anesthesia: Regional | Site: Shoulder | Laterality: Right

## 2016-09-25 MED ORDER — MEPERIDINE HCL 25 MG/ML IJ SOLN: 6.2500 mg | INTRAMUSCULAR | Status: DC | PRN

## 2016-09-25 MED ORDER — METOCLOPRAMIDE HCL 5 MG/ML IJ SOLN: 10.0000 mg | Freq: Once | INTRAMUSCULAR | Status: DC | PRN

## 2016-09-25 MED ORDER — HYDROMORPHONE HCL 1 MG/ML IJ SOLN: 0.2500 mg | INTRAMUSCULAR | Status: DC | PRN

## 2016-09-25 MED ORDER — LACTATED RINGERS IV SOLN
INTRAVENOUS | Status: DC
  Administered 2016-09-25: 13:00:00 via INTRAVENOUS

## 2016-09-25 MED ORDER — EPHEDRINE SULFATE 50 MG/ML IJ SOLN
INTRAMUSCULAR | Status: DC | PRN
  Administered 2016-09-25: 5 mg via INTRAVENOUS

## 2016-09-25 MED ORDER — SODIUM CHLORIDE 0.9 % IV SOLN: 75.0000 mL/h | INTRAVENOUS | Status: DC

## 2016-09-25 MED ORDER — BUPIVACAINE-EPINEPHRINE (PF) 0.5% -1:200000 IJ SOLN
INTRAMUSCULAR | Status: DC | PRN
  Administered 2016-09-25: 30 mL via PERINEURAL

## 2016-09-25 MED ORDER — LIDOCAINE HCL (CARDIAC) 20 MG/ML IV SOLN
INTRAVENOUS | Status: DC | PRN
  Administered 2016-09-25: 80 mg via INTRAVENOUS

## 2016-09-25 MED ORDER — DEXAMETHASONE SODIUM PHOSPHATE 10 MG/ML IJ SOLN
INTRAMUSCULAR | Status: DC | PRN
  Administered 2016-09-25: 10 mg via INTRAVENOUS

## 2016-09-25 MED ORDER — MIDAZOLAM HCL 2 MG/2ML IJ SOLN
INTRAMUSCULAR | Status: AC
  Filled 2016-09-25: qty 2

## 2016-09-25 MED ORDER — SCOPOLAMINE 1 MG/3DAYS TD PT72
MEDICATED_PATCH | TRANSDERMAL | Status: DC | PRN
  Administered 2016-09-25: 1 via TRANSDERMAL

## 2016-09-25 MED ORDER — FENTANYL CITRATE (PF) 100 MCG/2ML IJ SOLN
INTRAMUSCULAR | Status: AC
  Filled 2016-09-25: qty 2

## 2016-09-25 MED ORDER — SUGAMMADEX SODIUM 200 MG/2ML IV SOLN
INTRAVENOUS | Status: DC | PRN
  Administered 2016-09-25: 200 mg via INTRAVENOUS

## 2016-09-25 MED ORDER — CHLORHEXIDINE GLUCONATE 4 % EX LIQD: 60.0000 mL | Freq: Once | CUTANEOUS | Status: DC

## 2016-09-25 MED ORDER — FENTANYL CITRATE (PF) 100 MCG/2ML IJ SOLN
50.0000 ug | Freq: Once | INTRAMUSCULAR | Status: AC
  Administered 2016-09-25: 50 ug via INTRAVENOUS

## 2016-09-25 MED ORDER — PHENYLEPHRINE HCL 10 MG/ML IJ SOLN
INTRAVENOUS | Status: DC | PRN
  Administered 2016-09-25: 35 ug/min via INTRAVENOUS

## 2016-09-25 MED ORDER — PHENYLEPHRINE 40 MCG/ML (10ML) SYRINGE FOR IV PUSH (FOR BLOOD PRESSURE SUPPORT)
PREFILLED_SYRINGE | INTRAVENOUS | Status: DC | PRN
  Administered 2016-09-25: 40 ug via INTRAVENOUS
  Administered 2016-09-25: 120 ug via INTRAVENOUS

## 2016-09-25 MED ORDER — PROPOFOL 10 MG/ML IV BOLUS
INTRAVENOUS | Status: AC
  Filled 2016-09-25: qty 20

## 2016-09-25 MED ORDER — FENTANYL CITRATE (PF) 100 MCG/2ML IJ SOLN
INTRAMUSCULAR | Status: DC | PRN
  Administered 2016-09-25: 100 ug via INTRAVENOUS

## 2016-09-25 MED ORDER — GLYCOPYRROLATE 0.2 MG/ML IJ SOLN
INTRAMUSCULAR | Status: DC | PRN
  Administered 2016-09-25: 0.2 mg via INTRAVENOUS

## 2016-09-25 MED ORDER — DEXAMETHASONE SODIUM PHOSPHATE 10 MG/ML IJ SOLN
INTRAMUSCULAR | Status: AC
  Filled 2016-09-25: qty 1

## 2016-09-25 MED ORDER — ONDANSETRON HCL 4 MG/2ML IJ SOLN
4.0000 mg | Freq: Once | INTRAMUSCULAR | Status: AC
Start: 2016-09-25 — End: 2016-09-25
  Administered 2016-09-25: 4 mg via INTRAVENOUS
  Filled 2016-09-25: qty 2

## 2016-09-25 MED ORDER — MIDAZOLAM HCL 2 MG/2ML IJ SOLN
INTRAMUSCULAR | Status: AC
  Administered 2016-09-25: 2 mg via INTRAVENOUS
  Filled 2016-09-25: qty 2

## 2016-09-25 MED ORDER — ACETAMINOPHEN 10 MG/ML IV SOLN
1000.0000 mg | Freq: Once | INTRAVENOUS | Status: AC
  Administered 2016-09-25: 1000 mg via INTRAVENOUS
  Filled 2016-09-25: qty 100

## 2016-09-25 MED ORDER — MIDAZOLAM HCL 5 MG/5ML IJ SOLN
INTRAMUSCULAR | Status: DC | PRN
  Administered 2016-09-25: 2 mg via INTRAVENOUS

## 2016-09-25 MED ORDER — ONDANSETRON HCL 4 MG/2ML IJ SOLN
INTRAMUSCULAR | Status: DC | PRN
  Administered 2016-09-25: 4 mg via INTRAVENOUS

## 2016-09-25 MED ORDER — LACTATED RINGERS IV SOLN
INTRAVENOUS | Status: DC | PRN
  Administered 2016-09-25: 14:00:00 via INTRAVENOUS

## 2016-09-25 MED ORDER — PROPOFOL 10 MG/ML IV BOLUS
INTRAVENOUS | Status: DC | PRN
  Administered 2016-09-25: 150 mg via INTRAVENOUS
  Administered 2016-09-25: 30 mg via INTRAVENOUS

## 2016-09-25 MED ORDER — OXYCODONE-ACETAMINOPHEN 5-325 MG PO TABS: 1.0000 | ORAL_TABLET | ORAL | 0 refills | Status: DC | PRN

## 2016-09-25 MED ORDER — ONDANSETRON HCL 4 MG/2ML IJ SOLN
INTRAMUSCULAR | Status: AC
  Administered 2016-09-25: 4 mg via INTRAVENOUS
  Filled 2016-09-25: qty 2

## 2016-09-25 MED ORDER — ONDANSETRON HCL 4 MG/2ML IJ SOLN
INTRAMUSCULAR | Status: AC
  Filled 2016-09-25: qty 4

## 2016-09-25 MED ORDER — SODIUM CHLORIDE 0.9 % IR SOLN
Status: DC | PRN
Start: 2016-09-25 — End: 2016-09-25
  Administered 2016-09-25: 3000 mL

## 2016-09-25 MED ORDER — SCOPOLAMINE 1 MG/3DAYS TD PT72
MEDICATED_PATCH | TRANSDERMAL | Status: AC
  Filled 2016-09-25: qty 1

## 2016-09-25 MED ORDER — FENTANYL CITRATE (PF) 100 MCG/2ML IJ SOLN
INTRAMUSCULAR | Status: AC
  Administered 2016-09-25: 50 ug via INTRAVENOUS
  Filled 2016-09-25: qty 2

## 2016-09-25 MED ORDER — ROCURONIUM BROMIDE 100 MG/10ML IV SOLN
INTRAVENOUS | Status: DC | PRN
  Administered 2016-09-25: 50 mg via INTRAVENOUS

## 2016-09-25 MED ORDER — MIDAZOLAM HCL 2 MG/2ML IJ SOLN
2.0000 mg | Freq: Once | INTRAMUSCULAR | Status: AC
  Administered 2016-09-25: 2 mg via INTRAVENOUS

## 2016-09-25 SURGICAL SUPPLY — 52 items
APL SKNCLS STERI-STRIP NONHPOA (GAUZE/BANDAGES/DRESSINGS) ×2
BENZOIN TINCTURE PRP APPL 2/3 (GAUZE/BANDAGES/DRESSINGS) ×2 IMPLANT
BLADE GREAT WHITE 4.2 (BLADE) ×3 IMPLANT
BUR OVAL 4.0 (BURR) ×1 IMPLANT
BUR OVAL 6.0 (BURR) ×3 IMPLANT
CANNULA ACUFLEX KIT 5X76 (CANNULA) ×3 IMPLANT
DRAPE IMP U-DRAPE 54X76 (DRAPES) ×3 IMPLANT
DRAPE SHOULDER BEACH CHAIR (DRAPES) ×3 IMPLANT
DRAPE STERI 35X30 U-POUCH (DRAPES) ×3 IMPLANT
DRESSING AQUACEL AG SP 3.5X10 (GAUZE/BANDAGES/DRESSINGS) ×1 IMPLANT
DRSG AQUACEL AG ADV 3.5X 6 (GAUZE/BANDAGES/DRESSINGS) ×2 IMPLANT
DRSG AQUACEL AG SP 3.5X10 (GAUZE/BANDAGES/DRESSINGS) ×3
DRSG EMULSION OIL 3X3 NADH (GAUZE/BANDAGES/DRESSINGS) ×3 IMPLANT
DRSG PAD ABDOMINAL 8X10 ST (GAUZE/BANDAGES/DRESSINGS) ×3 IMPLANT
DURAPREP 26ML APPLICATOR (WOUND CARE) ×3 IMPLANT
ELECT MENISCUS 165MM 90D (ELECTRODE) IMPLANT
GAUZE SPONGE 4X4 12PLY STRL (GAUZE/BANDAGES/DRESSINGS) ×3 IMPLANT
GLOVE BIOGEL PI IND STRL 8 (GLOVE) ×2 IMPLANT
GLOVE BIOGEL PI IND STRL 8.5 (GLOVE) IMPLANT
GLOVE BIOGEL PI INDICATOR 8 (GLOVE) ×1
GLOVE BIOGEL PI INDICATOR 8.5 (GLOVE)
GLOVE ECLIPSE 8.0 STRL XLNG CF (GLOVE) ×3 IMPLANT
GLOVE SURG ORTHO 8.5 STRL (GLOVE) IMPLANT
GOWN STRL REUS W/ TWL LRG LVL3 (GOWN DISPOSABLE) ×4 IMPLANT
GOWN STRL REUS W/ TWL XL LVL3 (GOWN DISPOSABLE) ×2 IMPLANT
GOWN STRL REUS W/TWL LRG LVL3 (GOWN DISPOSABLE) ×6
GOWN STRL REUS W/TWL XL LVL3 (GOWN DISPOSABLE) ×3
KIT ROOM TURNOVER OR (KITS) ×3 IMPLANT
MANIFOLD NEPTUNE II (INSTRUMENTS) ×3 IMPLANT
NDL SPNL 18GX3.5 QUINCKE PK (NEEDLE) ×1 IMPLANT
NEEDLE 22X1 1/2 (OR ONLY) (NEEDLE) ×3 IMPLANT
NEEDLE SPNL 18GX3.5 QUINCKE PK (NEEDLE) ×3 IMPLANT
NS IRRIG 1000ML POUR BTL (IV SOLUTION) ×3 IMPLANT
PACK SHOULDER (CUSTOM PROCEDURE TRAY) ×3 IMPLANT
PACK UNIVERSAL I (CUSTOM PROCEDURE TRAY) ×3 IMPLANT
PAD ARMBOARD 7.5X6 YLW CONV (MISCELLANEOUS) ×6 IMPLANT
PROBE BIPOLAR ATHRO 135MM 90D (MISCELLANEOUS) ×2 IMPLANT
SET ARTHROSCOPY TUBING (MISCELLANEOUS) ×3
SET ARTHROSCOPY TUBING LN (MISCELLANEOUS) ×2 IMPLANT
STRIP CLOSURE SKIN 1/2X4 (GAUZE/BANDAGES/DRESSINGS) ×2 IMPLANT
SUT ETHIBOND NAB CT1 #1 30IN (SUTURE) ×4 IMPLANT
SUT ETHILON 4 0 PS 2 18 (SUTURE) ×3 IMPLANT
SUT VIC AB 0 CT1 27 (SUTURE) ×3
SUT VIC AB 0 CT1 27XBRD ANBCTR (SUTURE) ×1 IMPLANT
SUT VIC AB 2-0 SH 27 (SUTURE) ×3
SUT VIC AB 2-0 SH 27X BRD (SUTURE) ×1 IMPLANT
SYR 20ML ECCENTRIC (SYRINGE) ×3 IMPLANT
SYR CONTROL 10ML LL (SYRINGE) ×3 IMPLANT
TOWEL OR 17X24 6PK STRL BLUE (TOWEL DISPOSABLE) ×3 IMPLANT
TOWEL OR 17X26 10 PK STRL BLUE (TOWEL DISPOSABLE) ×3 IMPLANT
WAND HAND CNTRL MULTIVAC 90 (MISCELLANEOUS) ×1 IMPLANT
WATER STERILE IRR 1000ML POUR (IV SOLUTION) ×3 IMPLANT

## 2016-09-25 NOTE — Transfer of Care (Signed)
Immediate Anesthesia Transfer of Care Note  Patient: Jennifer Coleman  Procedure(s) Performed: Procedure(s): Right Shoulder Manipulation, Arthroscopic Debridement of Joint, Removal of Loose Body and Re-Repair of Rotator Cuff (Right)  Patient Location: PACU  Anesthesia Type:GA combined with regional for post-op pain  Level of Consciousness: awake, alert  and oriented  Airway & Oxygen Therapy: Patient Spontanous Breathing and Patient connected to nasal cannula oxygen  Post-op Assessment: Report given to RN and Post -op Vital signs reviewed and stable  Post vital signs: Reviewed and stable  Last Vitals:  Vitals:   09/25/16 1250 09/25/16 1255  BP: (!) 122/52 (!) 116/50  Pulse: 80 82  Resp: 14 14  Temp:      Last Pain:  Vitals:   09/25/16 1225  TempSrc:   PainSc: 3          Complications: No apparent anesthesia complications

## 2016-09-25 NOTE — Anesthesia Procedure Notes (Signed)
Procedure Name: Intubation Date/Time: 09/25/2016 1:46 PM Performed by: Gaylene Brooks Pre-anesthesia Checklist: Patient identified, Emergency Drugs available, Suction available and Patient being monitored Patient Re-evaluated:Patient Re-evaluated prior to inductionOxygen Delivery Method: Circle System Utilized Preoxygenation: Pre-oxygenation with 100% oxygen Intubation Type: IV induction Ventilation: Mask ventilation without difficulty Laryngoscope Size: Miller and 2 Grade View: Grade I Tube type: Oral Tube size: 7.0 mm Number of attempts: 1 Airway Equipment and Method: Stylet and Oral airway Placement Confirmation: ETT inserted through vocal cords under direct vision,  positive ETCO2 and breath sounds checked- equal and bilateral Secured at: 22 cm Tube secured with: Tape Dental Injury: Teeth and Oropharynx as per pre-operative assessment

## 2016-09-25 NOTE — Anesthesia Preprocedure Evaluation (Signed)
Anesthesia Evaluation  Patient identified by MRN, date of birth, ID band Patient awake  General Assessment Comment:Sister prolonged muscle relaxation from succinylcholine  Reviewed: Allergy & Precautions, NPO status , Patient's Chart, lab work & pertinent test results  History of Anesthesia Complications (+) PONV, Family history of anesthesia reaction and history of anesthetic complications  Airway Mallampati: II  TM Distance: >3 FB Neck ROM: Full    Dental no notable dental hx. (+) Teeth Intact   Pulmonary pneumonia, resolved, former smoker,    Pulmonary exam normal breath sounds clear to auscultation       Cardiovascular hypertension, Pt. on medications + Peripheral Vascular Disease  Normal cardiovascular exam Rhythm:Regular Rate:Normal     Neuro/Psych PSYCHIATRIC DISORDERS Anxiety Depression negative neurological ROS     GI/Hepatic negative GI ROS, Neg liver ROS,   Endo/Other  Morbid obesity  Renal/GU Renal disease  negative genitourinary   Musculoskeletal  (+) Arthritis , Osteoarthritis,  Right rotator cuff tear   Abdominal (+) + obese,   Peds  Hematology   Anesthesia Other Findings   Reproductive/Obstetrics                             Lab Results  Component Value Date   WBC 8.0 09/21/2016   HGB 14.4 09/21/2016   HCT 42.9 09/21/2016   MCV 94.7 09/21/2016   PLT 262 09/21/2016     Chemistry      Component Value Date/Time   NA 139 09/21/2016 1148   NA 139 12/30/2015 1338   K 3.9 09/21/2016 1148   CL 103 09/21/2016 1148   CO2 29 09/21/2016 1148   BUN 22 (H) 09/21/2016 1148   BUN 17 12/30/2015 1338   CREATININE 0.98 09/21/2016 1148      Component Value Date/Time   CALCIUM 10.3 09/21/2016 1148   ALKPHOS 75 12/30/2015 1338   AST 23 12/30/2015 1338   ALT 13 12/30/2015 1338   BILITOT 0.4 12/30/2015 1338     EKG: normal sinus rhythm, RBBB, LAE.  Anesthesia  Physical Anesthesia Plan  ASA: III  Anesthesia Plan: General and Regional   Post-op Pain Management:  Regional for Post-op pain   Induction: Intravenous  Airway Management Planned: Oral ETT  Additional Equipment:   Intra-op Plan:   Post-operative Plan: Extubation in OR  Informed Consent: I have reviewed the patients History and Physical, chart, labs and discussed the procedure including the risks, benefits and alternatives for the proposed anesthesia with the patient or authorized representative who has indicated his/her understanding and acceptance.   Dental advisory given  Plan Discussed with: Anesthesiologist, CRNA and Surgeon  Anesthesia Plan Comments:         Anesthesia Quick Evaluation

## 2016-09-25 NOTE — Addendum Note (Signed)
Addendum  created 09/25/16 1633 by Wilburn Cornelia, CRNA   Charge Capture section accepted, Visit diagnoses modified

## 2016-09-25 NOTE — H&P (Signed)
The recent History & Physical has been reviewed. I have personally examined the patient today. There is no interval change to the documented History & Physical. The patient would like to proceed with the procedure.  [name] 09/25/2016,  12:48 PM

## 2016-09-25 NOTE — Op Note (Signed)
PATIENT ID:      Jennifer Coleman  MRN:     [id number] DOB/AGE:    11-16-1950 / 65 y.o.       OPERATIVE REPORT    DATE OF PROCEDURE:  09/25/2016       PREOPERATIVE DIAGNOSIS:  Loosened rotator cuff PEEK anchor with foreign body reaction, possible recurrent rotator cuff tear, adhesive capsulitis right shoulder                                                        Estimated body mass index is 42.45 kg/m as calculated from the following:   Height as of 09/21/16: 5\' 6"  (1.676 m).   Weight as of this encounter: 263 lb (119.3 kg).     POSTOPERATIVE DIAGNOSIS:  Loosened anchor, small recurrent rotator cuff tear,adhesive capsulitis                                                                    Estimated body mass index is 42.45 kg/m as calculated from the following:   Height as of 09/21/16: 5\' 6"  (1.676 m).   Weight as of this encounter: 263 lb (119.3 kg).    PROCEDURE; manipulation right shoulder, arthroscopic debridement gleno-humeral joint with synovectomy,mini open rotator cuff tear repair With removal loosened anchor    SURGEON:  Joni Fears, MD    ASSISTANT:   Biagio Borg, PA-C   (Present and scrubbed throughout the case, critical for assistance with exposure, retraction, instrumentation, and closure.)          ANESTHESIA: regional and general     DRAINS: none :      TOURNIQUET TIME: * No tourniquets in log *    COMPLICATIONS:  None   CONDITION:  stable  PROCEDURE IN DETAIL:    [name] 09/25/2016, 3:10 PM

## 2016-09-25 NOTE — Anesthesia Procedure Notes (Signed)
Anesthesia Regional Block:  Supraclavicular block  Pre-Anesthetic Checklist: ,, timeout performed, Correct Patient, Correct Site, Correct Laterality, Correct Procedure, Correct Position, site marked, Risks and benefits discussed,  Surgical consent,  Pre-op evaluation,  At surgeon's request and post-op pain management  Laterality: Right  Prep: chloraprep       Needles:  Injection technique: Single-shot  Needle Type: Echogenic Stimulator Needle     Needle Length: 9cm 9 cm Needle Gauge: 21 and 21 G  Needle insertion depth: 4 cm   Additional Needles:  Procedures: ultrasound guided (picture in chart) Supraclavicular block Narrative:  Start time: 09/25/2016 12:36 PM End time: 09/25/2016 12:42 PM Injection made incrementally with aspirations every 5 mL.  Performed by: Personally  Anesthesiologist: Josephine Igo  Additional Notes: Risks, benefits and alternatives of block discussed with patient. Timeout performed. Relevant anatomy ID'd with [place]. Incremental 3ml injection with frequent aspiration. Patient tolerated procedure well.

## 2016-09-25 NOTE — Anesthesia Postprocedure Evaluation (Signed)
Anesthesia Post Note  Patient: Cruzita Lederer  Procedure(s) Performed: Procedure(s) (LRB): Right Shoulder Manipulation, Arthroscopic Debridement of Joint, Removal of Loose Body and Re-Repair of Rotator Cuff (Right)  Patient location during evaluation: PACU Anesthesia Type: General and Regional Level of consciousness: awake and alert and oriented Pain management: pain level controlled Vital Signs Assessment: post-procedure vital signs reviewed and stable Respiratory status: spontaneous breathing, nonlabored ventilation and respiratory function stable Cardiovascular status: blood pressure returned to baseline and stable Postop Assessment: no signs of nausea or vomiting Anesthetic complications: no    Last Vitals:  Vitals:   09/25/16 1538 09/25/16 1542  BP:    Pulse: 92 89  Resp: 19 (!) 23  Temp: 36.5 C     Last Pain:  Vitals:   09/25/16 1538  TempSrc:   PainSc: 0-No pain                 Charlis Harner A.

## 2016-09-26 ENCOUNTER — Encounter (HOSPITAL_COMMUNITY): Payer: Self-pay | Admitting: Orthopaedic Surgery

## 2016-09-26 NOTE — Op Note (Signed)
NAMESONAL, DORWART NO.:  [id number]  MEDICAL RECORD NO.:  [id number]  LOCATION:                                 FACILITY:  PHYSICIAN:  Jennifer Coleman, M.D.DATE OF BIRTH:  28-Jun-1951  DATE OF PROCEDURE:  09/25/2016 DATE OF DISCHARGE:                              OPERATIVE REPORT   PREOPERATIVE DIAGNOSES: 1. Adhesive capsulitis, right shoulder. 2. Loosened PEEK rotator cuff anchor with foreign body reaction and     possible recurrent rotator cuff tear.  POSTOPERATIVE DIAGNOSES: 1. Loosened PEEK anchor with foreign body reaction and small recurrent     rotator cuff tear. 2. Adhesive capsulitis.  PROCEDURE: 1. Examination of right shoulder under anesthesia with manipulation     for adhesive capsulitis. 2. Arthroscopic evaluation of glenohumeral joint with debridement of     synovitis and adhesions. 3. Mini open rotator cuff tear repair with removal of loosened anchor.  SURGEON:  Jennifer Kotyk. Durward Fortes, M.D.  ASSISTANT:  Biagio Borg, PA-C.  ANESTHESIA:  General with interscalene nerve block.  COMPLICATIONS:  None.  HISTORY:  This 65 year old female is approximately 4 months status post rotator cuff tear repair of right shoulder involving both infra and supraspinatus tendon.  She also had a biceps tenodesis.  Prior to surgery, she had manipulation of her shoulder for adhesive capsulitis. She initially did well in therapy only to have increasing pain to the point of compromise.  She has had a followup MRI scan revealing a loosened anchor, probably related to osteopenia and possibly a small recurrent rotator cuff tear.  Because of persistent pain, she is now to have an arthroscopic evaluation and manipulate the shoulder and a mini open rotator cuff tear repair with removal of the loosened anchor.  DESCRIPTION OF PROCEDURE:  Ms. Asbill was met with her husband in the holding area, identified the right shoulder as appropriate operative site and marked  it accordingly.  The patient did receive an interscalene nerve block per Anesthesia.  The patient was then transported to room #7, placed under general anesthesia without difficulty.  A time-out was called.  The patient was placed in the semi sitting position with a shoulder frame, and the right shoulder was then prepped from the base of the neck circumferentially to the wrist, sterile draping was performed.  Time-out was called again.  A marking pen was used to outline the [facility] joint, the coracoid and acromion.  Arthroscopic evaluation of shoulder was performed through a posterior portal.  Using the prior portal scar, this was opened with a 15 blade knife.  The arthroscope was easily placed into the shoulder joint.  Prior to the procedure, I did manipulate the shoulder and she did have adhesive capsulitis, and I manipulated in flexion, abduction, internal and external rotation with obvious lysis of adhesions.  Arthroscopic evaluation revealed some persistent adhesions which were debrided using a second portal established anteriorly with the rib cannula.  There was an obvious loosening of an anchor that was visualized from within the joint with very minimal retraction of the rotator cuff.  I did not see any loose bodies other than the deformity as mentioned above nor evidence of  chondromalacia.  There was certainly no evidence of infection.  The arthroscope was then placed in subacromial space posteriorly, the cannula in subacromial space anteriorly, and a third portal established in lateral subacromial space.  There was a moderate amount of inflammatory bursal tissue that was resected.  I did not see an obvious rotator cuff tear.  I thought I had an adequate resection of the distal clavicle and the anterior and lateral subacromial region.  Instruments removed and mini open rotator cuff tear repair was performed.  I used the old incision via sharp dissection, carried down to  subcutaneous tissue.  Gross bleeding was controlled with the Bovie. A rhaphe in the deltoid fascia was identified and incised.  Subacromial space was entered.  There was some residual bursal tissue that was removed.  The PEEK anchor had dislodged from the bone and was visible and caused a localized foreign body reaction.  This was removed along with its attached FiberWire.  There were several other FiberWires that were identified, they were removed as well.  There were some small recurrent tears within the tendon without significant retraction.  These were repaired with 0 Ethibond suture.  We had an adequate subacromial decompression.  The wound was then irrigated.  I thought that the wound repair was otherwise intact and did not require a patch.  The deltoid fascia was then closed with a running 0 Vicryl, subcu with 3- 0 Monocryl.  The arthroscopic portals were closed anterior and laterally with 4-0 nylon, and the mini open repair was closed with Steri-Strips over benzoin.  Sterile bulky dressing was applied followed by a sling.  PLAN:  Follow up in the office next week, oxycodone for pain.     Jennifer Kotyk. Durward Fortes, M.D.   ______________________________ Jennifer Kotyk. Durward Fortes, M.D.    PWW/MEDQ  D:  09/25/2016  T:  09/26/2016  Job:  [id number]

## 2016-10-02 ENCOUNTER — Encounter (HOSPITAL_COMMUNITY): Payer: Self-pay

## 2016-10-02 ENCOUNTER — Ambulatory Visit (HOSPITAL_COMMUNITY): Payer: 59 | Attending: Orthopaedic Surgery

## 2016-10-02 DIAGNOSIS — M25511 Pain in right shoulder: Secondary | ICD-10-CM | POA: Diagnosis not present

## 2016-10-02 DIAGNOSIS — M25611 Stiffness of right shoulder, not elsewhere classified: Secondary | ICD-10-CM | POA: Insufficient documentation

## 2016-10-02 DIAGNOSIS — R29898 Other symptoms and signs involving the musculoskeletal system: Secondary | ICD-10-CM | POA: Diagnosis not present

## 2016-10-02 DIAGNOSIS — J019 Acute sinusitis, unspecified: Secondary | ICD-10-CM | POA: Diagnosis not present

## 2016-10-02 NOTE — Patient Instructions (Signed)
TOWEL SLIDES COMPLETE FOR 1-3 MINUTES, 3-5 TIMES PER DAY  SHOULDER: Flexion On Table   Place hands on table, elbows straight. Move hips away from body. Press hands down into table. Hold ___ seconds. ___ reps per set, ___ sets per day, ___ days per week  Abduction (Passive)   With arm out to side, resting on table, lower head toward arm, keeping trunk away from table. Hold ____ seconds. Repeat ____ times. Do ____ sessions per day.  Copyright  VHI. All rights reserved.     Internal Rotation (Assistive)   Seated with elbow bent at right angle and held against side, slide arm on table surface in an inward arc. Repeat ____ times. Do ____ sessions per day. Activity: Use this motion to brush crumbs off the table.  Copyright  VHI. All rights reserved.    COMPLETE PENDULUM EXERCISES FOR 30 SECONDS TO A MINUTE EACH, 3-5 TIMES PER DAY. ROM: Pendulum (Side-to-Side)     http://orth.exer.us/792   Copyright  VHI. All rights reserved.  Pendulum Forward/Back   Bend forward 90 at waist, using table for support. Rock body forward and back to swing arm. Repeat ____ times. Do ____ sessions per day.  Copyright  VHI. All rights reserved.  Pendulum Circular   Bend forward 90 at waist, leaning on table for support. Rock body in a circular pattern to move arm clockwise ____ times then counterclockwise ____ times. Do ____ sessions per day.  Copyright  VHI. All rights reserved.  AROM: Wrist Extension   With right palm down, bend wrist up. Repeat 10____ times per set. Do ____ sets per session. Do __3__ sessions per day.  Copyright  VHI. All rights reserved.   AROM: Wrist Flexion   With right palm up, bend wrist up. Repeat ___10_ times per set. Do ____ sets per session. Do __3__ sessions per day.  Copyright  VHI. All rights reserved.   AROM: Forearm Pronation / Supination   With right arm in handshake position, slowly rotate palm down until stretch is felt. Relax. Then  rotate palm up until stretch is felt. Repeat __10__ times per set. Do ____ sets per session. Do __3__ sessions per day.  Copyright  VHI. All rights reserved.   AFlexion (Passive)   Use other hand to bend elbow, with thumb toward same shoulder. Do NOT force this motion. Hold ____ seconds. Repeat ____ times. Do ____ sessions per day.

## 2016-10-02 NOTE — Therapy (Signed)
[place], [place], [address] Phone: [phone]   Fax:  [phone]  Occupational Therapy Evaluation  Patient Details  Name: Jennifer Coleman MRN: [id number] Date of Birth: [birth year] Referring Provider: Joni Fears, MD  Encounter Date: 10/02/2016      OT End of Session - 10/02/16 1134    Visit Number 1   Number of Visits 24   Date for OT Re-Evaluation 12/01/16  mini reassess: 10/31/15   Authorization Type UMR $20.00 copay   OT Start Time 0949   OT Stop Time 1030   OT Time Calculation (min) 41 min   Activity Tolerance Patient tolerated treatment well;Patient limited by pain   Behavior During Therapy [facility] for tasks assessed/performed      Past Medical History:  Diagnosis Date  . Anxiety   . Arthritis   . Family history of anesthesia complication 25 yrs ago   Sister allergic to sccinocholine - unable to come off ventilator  . Hypertension   . Pneumonia   . PONV (postoperative nausea and vomiting)   . Premature ventricular contractions   . Shingles   . Varicose veins   . Varicose veins of left lower extremity     Past Surgical History:  Procedure Laterality Date  . CARPAL TUNNEL RELEASE    . CERVICAL DISC SURGERY  1992   Fusion  . COLONOSCOPY N/A 06/07/2016   Procedure: COLONOSCOPY;  Surgeon: Rogene Houston, MD;  [facility];  Service: Endoscopy;  Laterality: N/A;  1:00  . KNEE ARTHROSCOPY  09/16/2012   Procedure: ARTHROSCOPY KNEE;  Surgeon: [name], MD;  Location: [place];  Service: Orthopedics;  Laterality: Right;  Right Knee Arthroscopy  . SALPINGOOPHORECTOMY Left 1985  . SHOULDER ARTHROSCOPY WITH OPEN ROTATOR CUFF REPAIR AND DISTAL CLAVICLE ACROMINECTOMY Right 05/03/2016   Procedure: RIGHT SHOULDER ARTHROSCOPY WITH MINI-OPEN ROTATOR CUFF REPAIR,DISTAL CLAVICLE RESECTION, SUBACROMIAL DECOMPRESSION.;  Surgeon: [name], MD;  Location: [place];  Service:  Orthopedics;  Laterality: Right;  . SHOULDER ARTHROSCOPY WITH ROTATOR CUFF REPAIR Right 09/25/2016   Procedure: Right Shoulder Manipulation, Arthroscopic Debridement of Joint, Removal of Loose Body and Re-Repair of Rotator Cuff;  Surgeon: [name], MD;  Location: [place];  Service: Orthopedics;  Laterality: Right;    There were no vitals filed for this visit.      Subjective Assessment - 10/02/16 0959    Pertinent History patient is a 65 y/o female S/P manipulation right shoulder, arthroscopic debridement gleno-humeral joint with synovectomy,mini open rotator cuff tear repair which was completed on 09/25/16. pt was previously seen at our clinic for Right SAD, DCE, RCR and biceps tenodesis for 15 weeks of therapy. Due to increased pain and the discovery of an anchor that had moved surgery was required to repair. Dr. Durward Fortes referred patient to occupational therapy for evaluation and treatment.   Special Tests FOTO to be completed at next session.   Patient Stated Goals To increase mobility and strength of RUE.   Currently in Pain? Yes   Pain Score 6    Pain Location Shoulder   Pain Orientation Right   Pain Descriptors / Indicators Throbbing;Constant   Pain Type Surgical pain   Pain Radiating Towards up neck   Pain Onset In the past 7 days   Pain Frequency Constant   Aggravating Factors  sudden movement   Pain Relieving Factors pain meds, ice   Effect of Pain on Daily Activities Pt  unable to use RUE for daily tasks at this time.   Multiple Pain Sites No           [facility] OT Assessment - 10/02/16 1001      Assessment   Diagnosis manipulation right shoulder, arthroscopic debridement gleno-humeral joint with synovectomy,mini open rotator cuff tear repair   Referring Provider Joni Fears, MD   Onset Date 09/25/16   Assessment Pt has follow up appointment with Dr. Durward Fortes tomorrow 10/03/16.    Prior Therapy Pt was previously seen at our clinic for Right SAD, DCE, RCR and  biceps tenodesis for 15 weeks.     Precautions   Precautions Shoulder   Type of Shoulder Precautions P/ROM for 4 weeks with sling on for 4-6 weeks. Progress to AA/ROM on 10/23/16 . progress to A/ROM on 11/21/15     Restrictions   Weight Bearing Restrictions Yes   Other Position/Activity Restrictions NWB RUE     Balance Screen   Has the patient fallen in the past 6 months No     Home  Environment   Family/patient expects to be discharged to: Private residence   Living Arrangements Spouse/significant other   Available Help at Discharge Family     Prior Function   Level of Independence Independent   Vocation Full time employment   Tree surgeon at [employer], computer and phone use   Leisure cooking, yard work, Barrister's clerk, watching grandchidlren play sports and dance     ADL   ADL comments patient is not able to use her right arm with any functional activities, dressing and bathing requires assistance or increased time to complete, unable to drive or work, or complete IADLs     Mobility   Mobility Status Independent     Written Expression   Dominant Hand Right     Vision - History   Baseline Vision Wears glasses all the time     Cognition   Overall Cognitive Status Within Functional Limits for tasks assessed     ROM / Strength   AROM / PROM / Strength AROM;PROM;Strength     Palpation   Palpation comment Mod fascial restriction in right upper arm, trapezius, and scapularis region.     AROM   Overall AROM  Unable to assess;Due to precautions     PROM   Overall PROM Comments Assessed supine. IR/er adducted   PROM Assessment Site Shoulder   Right/Left Shoulder Right   Right Shoulder Flexion 94 Degrees   Right Shoulder ABduction 82 Degrees   Right Shoulder Internal Rotation 90 Degrees   Right Shoulder External Rotation 25 Degrees     Strength   Overall Strength Unable to assess;Due to precautions                  OT  Treatments/Exercises (OP) - 10/02/16 1025      Exercises   Exercises Shoulder     Shoulder Exercises: Supine   Protraction PROM;5 reps   Horizontal ABduction PROM;5 reps   External Rotation PROM;5 reps   Internal Rotation PROM;5 reps   Flexion PROM;5 reps   ABduction PROM;5 reps     Shoulder Exercises: Seated   Elevation AROM;10 reps   Extension AROM;10 reps   Row AROM;10 reps     Shoulder Exercises: Therapy Ball   Flexion 10 reps   ABduction 10 reps     Manual Therapy   Manual Therapy Myofascial release   Manual therapy comments manual therapy completed seperately from all other  interventions this date   Myofascial Release myofascial release and manual stretching to right upper arm, scapular, shoulder region and associated areas to decrease pain and restrictions and imrpove pain free mobility in right shoulder.                 OT Education - 10/02/16 1016    Education provided Yes   Education Details table slides, elbow and wrist A/ROM, pendulums   Person(s) Educated Patient   Methods Explanation   Comprehension Verbalized understanding          OT Short Term Goals - 10/02/16 1142      OT SHORT TERM GOAL #1   Title Patient will be educated on a HEP for right shoulder ROM and strength.   Time 6   Period Weeks   Status New     OT SHORT TERM GOAL #2   Title Patient will improve right shoulder and elbow P/ROM to [facility] in order to complete B/ADLs with increased independence.   Time 6   Period Weeks   Status New     OT SHORT TERM GOAL #3   Title Patient will improve right shoulder and elbow strength to 3+/5 for increased ability to pick up baskets of laundry.   Time 6   Period Weeks   Status New     OT SHORT TERM GOAL #4   Title Patient will decrease right shoulder pain to 4/10 or better in order to complete B/ADLs with greater independence.   Time 6   Period Weeks   Status New     OT SHORT TERM GOAL #5   Title Patient will decrease fascial  restrictions to min amount in right shoulder region for greater mobility needed for greater use of right arm as dominant.    Time 6   Period Weeks   Status New           OT Long Term Goals - 10/02/16 1144      OT LONG TERM GOAL #1   Title Patient will return to prior level of independence with all B/IADLs, work, and leisure activities using her right arm as dominant.    Time 12   Period Weeks   Status New     OT LONG TERM GOAL #2   Title Patient will improve right shoulder and elbow A/ROM to WNL for greater ability to use right arm as dominant at work and home.    Time 12   Period Weeks   Status New     OT LONG TERM GOAL #3   Title Patient will improve right shoulder and elbow strength to 5/5 for greater ability to lift bags of mulch when completing yardwork.     Time 12   Period Weeks   Status New     OT LONG TERM GOAL #4   Title Patient will decrease right shoulder pain to 2/10 or better when completing functional actiivites.    Time 12   Period Weeks   Status New     OT LONG TERM GOAL #5   Title Paitent will decrease right fascial restrictions in right shoulder to trace amount in order to have greater mobility needed for use of right arm with functional activiites.     Time 12   Period Weeks   Status New               Plan - 10/02/16 1136    Clinical Impression Statement A: Patient is a 65 y/o female  S/P manipulation right shoulder, arthroscopic debridement gleno-humeral joint with synovectomy,mini open rotator cuff tear repair causing increased pain, fascial restrictions, and decreased ROM and strength resulting in difficulty completing daily tasks while using RUE.   OT Frequency 2x / week   OT Duration 12 weeks   OT Treatment/Interventions Self-care/ADL training;Electrical Stimulation;Moist Heat;Cryotherapy;Ultrasound;Therapeutic exercise;Neuromuscular education;DME and/or AE instruction;Energy conservation;Manual Therapy;Passive range of motion;Scar  mobilization;Therapeutic exercises;Therapeutic activities;Patient/family education   Plan P: Patient will benefit from skilled OT services to increase functional performance during daily tasks while using RUE. Treatment Plan: Follow standard protocol. Myofascial release, passive stretching, P/ROM, AA/ROM, A/ROM, general shoulder strengthening. Modalities PRN.   OT Home Exercise Plan 12/5: elbow and wrist A/ROM, scapular A/ROM exercises, table slides, pendulums   Consulted and Agree with Plan of Care Patient      Patient will benefit from skilled therapeutic intervention in order to improve the following deficits and impairments:  Decreased range of motion, Decreased skin integrity, Decreased scar mobility, Decreased strength, Increased muscle spasms, Increased fascial restricitons, Impaired UE functional use, Pain  Visit Diagnosis: Stiffness of right shoulder, not elsewhere classified - Plan: Ot plan of care cert/re-cert  Other symptoms and signs involving the musculoskeletal system - Plan: Ot plan of care cert/re-cert  Right shoulder pain, unspecified chronicity - Plan: Ot plan of care cert/re-cert    Problem List Patient Active Problem List   Diagnosis Date Noted  . Nontraumatic incomplete tear of right rotator cuff 09/25/2016  . Foreign body of shoulder right  09/25/2016  . 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 Shoulder joint painful on movement, right 08/29/2016  . Epigastric pain 08/11/2015  . Chest pain, rule out acute myocardial infarction 08/11/2015  . Renal insufficiency 08/11/2015  . Venous stasis 04/13/2015  . Depression 04/13/2015  . Essential hypertension 09/12/2014  . PVCs (premature ventricular contractions) 09/07/2014  . Precordial pain 09/07/2014   Ailene Ravel, OTR/L,CBIS  [phone]  10/02/2016, 11:51 AM  [place] [address] [place], [place], [address] Phone: [phone]   Fax:   [phone]  Name: Jennifer Coleman MRN: [id number] Date of Birth: Jun 19, 1951

## 2016-10-03 ENCOUNTER — Ambulatory Visit (INDEPENDENT_AMBULATORY_CARE_PROVIDER_SITE_OTHER): Payer: 59 | Admitting: Orthopaedic Surgery

## 2016-10-03 VITALS — BP 169/88 | HR 67 | Resp 14 | Ht 66.0 in | Wt 228.0 lb

## 2016-10-03 DIAGNOSIS — G8929 Other chronic pain: Secondary | ICD-10-CM

## 2016-10-03 DIAGNOSIS — M25511 Pain in right shoulder: Secondary | ICD-10-CM

## 2016-10-03 NOTE — Progress Notes (Signed)
Office Visit Note   Patient: Jennifer Coleman           Date of Birth: 1951-04-04           MRN: [id number] Visit Date: 10/03/2016              Requested by: Jennifer Kirschner, MD [name],  [address] PCP: Jennifer Hillier, MD   Assessment & Plan: Visit Diagnoses: Right shoulder arthroscopic debridement and mini open rotator cuff tear repair1 week. One of the anchors had dislodged. This was removed. I think that was causing her to have impingement pain. Were several small areas of recurrent rotator cuff tear, but the majority of the cuff had healed. She did have adhesive capsulitis which was manipulated. I would like Jennifer Coleman to limit her analgesic medicine  Plan: Follow-up in 2 weeks  Follow-Up Instructions: No Follow-up on file.   Orders:  No orders of the defined types were placed in this encounter.  No orders of the defined types were placed in this encounter.     Procedures: No procedures performed   Clinical Data: No additional findings.   Subjective: No chief complaint on file.   Pt says she is doing well. Pain is kept under control with oxy/percocet. Tramadol has but not taking.  Sleeping in recliner  Pt is one week post op of Right rotator cuff repair manipulation.  \10/02/16 was her first PT    Review of Systems   Objective: Vital Signs: There were no vitals taken for this visit.  Physical Exam  Ortho Exam right shoulder wounds are healing nicely without evidence of infection. New Steri-Strips are applied. She needs to continue with her home exercises and physical therapy to prevent adhesive capsulitis which was present at the time of surgery  Specialty Comments:  No specialty comments available.  Imaging: No results found.   PMFS History: Patient Active Problem List   Diagnosis Date Noted  . Nontraumatic incomplete tear of right rotator cuff 09/25/2016  . Foreign body of shoulder right  09/25/2016  . Adhesive capsulitis  of right shoulder 09/25/2016  . Shoulder joint painful on movement, right 08/29/2016  . Epigastric pain 08/11/2015  . Chest pain, rule out acute myocardial infarction 08/11/2015  . Renal insufficiency 08/11/2015  . Venous stasis 04/13/2015  . Depression 04/13/2015  . Essential hypertension 09/12/2014  . PVCs (premature ventricular contractions) 09/07/2014  . Precordial pain 09/07/2014   Past Medical History:  Diagnosis Date  . Anxiety   . Arthritis   . Family history of anesthesia complication 25 yrs ago   Sister allergic to sccinocholine - unable to come off ventilator  . Hypertension   . Pneumonia   . PONV (postoperative nausea and vomiting)   . Premature ventricular contractions   . Shingles   . Varicose veins   . Varicose veins of left lower extremity     Family History  Problem Relation Age of Onset  . Heart disease Sister   . Heart disease Brother     54's  . Diabetes Mellitus II Sister   . Multiple myeloma Mother   . Hypertension Sister   . Hypertension Father   . Heart attack Father     Past Surgical History:  Procedure Laterality Date  . CARPAL TUNNEL RELEASE    . CERVICAL DISC SURGERY  1992   Fusion  . COLONOSCOPY N/A 06/07/2016   Procedure: COLONOSCOPY;  Surgeon: Jennifer Houston, MD;  [facility];  Service: Endoscopy;  Laterality: N/A;  1:00  . KNEE ARTHROSCOPY  09/16/2012   Procedure: ARTHROSCOPY KNEE;  Surgeon: Jennifer Balding, MD;  Location: [place];  Service: Orthopedics;  Laterality: Right;  Right Knee Arthroscopy  . SALPINGOOPHORECTOMY Left 1985  . SHOULDER ARTHROSCOPY WITH OPEN ROTATOR CUFF REPAIR AND DISTAL CLAVICLE ACROMINECTOMY Right 05/03/2016   Procedure: RIGHT SHOULDER ARTHROSCOPY WITH MINI-OPEN ROTATOR CUFF REPAIR,DISTAL CLAVICLE RESECTION, SUBACROMIAL DECOMPRESSION.;  Surgeon: Jennifer Balding, MD;  Location: [place];  Service: Orthopedics;  Laterality: Right;  . SHOULDER ARTHROSCOPY WITH ROTATOR CUFF REPAIR Right  09/25/2016   Procedure: Right Shoulder Manipulation, Arthroscopic Debridement of Joint, Removal of Loose Body and Re-Repair of Rotator Cuff;  Surgeon: Jennifer Balding, MD;  Location: [place];  Service: Orthopedics;  Laterality: Right;   Social History   Occupational History  . Not on file.   Social History Main Topics  . Smoking status: Former Smoker    Types: Cigarettes  . Smokeless tobacco: Former Systems developer    Quit date: 01/12/1997  . Alcohol use No  . Drug use: No  . Sexual activity: Yes

## 2016-10-05 ENCOUNTER — Ambulatory Visit (HOSPITAL_COMMUNITY): Payer: 59

## 2016-10-05 ENCOUNTER — Encounter (HOSPITAL_COMMUNITY): Payer: Self-pay

## 2016-10-05 DIAGNOSIS — M25611 Stiffness of right shoulder, not elsewhere classified: Secondary | ICD-10-CM

## 2016-10-05 DIAGNOSIS — J019 Acute sinusitis, unspecified: Secondary | ICD-10-CM | POA: Diagnosis not present

## 2016-10-05 DIAGNOSIS — M25511 Pain in right shoulder: Secondary | ICD-10-CM | POA: Diagnosis not present

## 2016-10-05 DIAGNOSIS — R29898 Other symptoms and signs involving the musculoskeletal system: Secondary | ICD-10-CM

## 2016-10-05 NOTE — Therapy (Signed)
[place], [place], [address] Phone: [phone]   Fax:  [phone]  Occupational Therapy Treatment  Patient Details  Name: Jennifer Coleman MRN: [id number] Date of Birth: [birth year] Referring Provider: Joni Fears, MD  Encounter Date: 10/05/2016      OT End of Session - 10/05/16 0931    Visit Number 2   Number of Visits 24   Date for OT Re-Evaluation 12/01/16  mini reassess: 10/31/15   Authorization Type UMR $20.00 copay   OT Start Time 0820   OT Stop Time 0900   OT Time Calculation (min) 40 min   Activity Tolerance Patient tolerated treatment well   Behavior During Therapy [facility] for tasks assessed/performed      Past Medical History:  Diagnosis Date  . Anxiety   . Arthritis   . Family history of anesthesia complication 25 yrs ago   Sister allergic to sccinocholine - unable to come off ventilator  . Hypertension   . Pneumonia   . PONV (postoperative nausea and vomiting)   . Premature ventricular contractions   . Shingles   . Varicose veins   . Varicose veins of left lower extremity     Past Surgical History:  Procedure Laterality Date  . CARPAL TUNNEL RELEASE    . CERVICAL DISC SURGERY  1992   Fusion  . COLONOSCOPY N/A 06/07/2016   Procedure: COLONOSCOPY;  Surgeon: Rogene Houston, MD;  [facility];  Service: Endoscopy;  Laterality: N/A;  1:00  . KNEE ARTHROSCOPY  09/16/2012   Procedure: ARTHROSCOPY KNEE;  Surgeon: [name], MD;  Location: [place];  Service: Orthopedics;  Laterality: Right;  Right Knee Arthroscopy  . SALPINGOOPHORECTOMY Left 1985  . SHOULDER ARTHROSCOPY WITH OPEN ROTATOR CUFF REPAIR AND DISTAL CLAVICLE ACROMINECTOMY Right 05/03/2016   Procedure: RIGHT SHOULDER ARTHROSCOPY WITH MINI-OPEN ROTATOR CUFF REPAIR,DISTAL CLAVICLE RESECTION, SUBACROMIAL DECOMPRESSION.;  Surgeon: [name], MD;  Location: [place];  Service: Orthopedics;  Laterality:  Right;  . SHOULDER ARTHROSCOPY WITH ROTATOR CUFF REPAIR Right 09/25/2016   Procedure: Right Shoulder Manipulation, Arthroscopic Debridement of Joint, Removal of Loose Body and Re-Repair of Rotator Cuff;  Surgeon: [name], MD;  Location: [place];  Service: Orthopedics;  Laterality: Right;    There were no vitals filed for this visit.      Subjective Assessment - 10/05/16 0825    Subjective  S: The Doctor said to start using my pulleys.   Special Tests FOTO score: 26/100    Currently in Pain? Yes   Pain Score 5    Pain Location Shoulder   Pain Orientation Right   Pain Descriptors / Indicators Tender            [facility] OT Assessment - 10/05/16 0858      Assessment   Diagnosis manipulation right shoulder, arthroscopic debridement gleno-humeral joint with synovectomy,mini open rotator cuff tear repair     Precautions   Precautions Shoulder   Type of Shoulder Precautions P/ROM for 4 weeks with sling on for 4-6 weeks. Progress to AA/ROM on 10/23/16 . progress to A/ROM on 11/21/15                  OT Treatments/Exercises (OP) - 10/05/16 0858      Exercises   Exercises Shoulder     Shoulder Exercises: Supine   Protraction PROM;5 reps   Horizontal ABduction PROM;5 reps   External Rotation PROM;5 reps  Internal Rotation PROM;5 reps   Flexion PROM;5 reps   ABduction PROM;5 reps     Shoulder Exercises: Seated   Elevation AROM;10 reps   Extension AROM;10 reps   Row AROM;10 reps     Shoulder Exercises: Pulleys   Flexion 1 minute   ABduction 1 minute     Shoulder Exercises: Therapy Ball   Flexion 15 reps   ABduction 15 reps     Shoulder Exercises: Isometric Strengthening   Flexion Supine;3X3"   Extension Supine;3X3"   External Rotation Supine;3X3"   Internal Rotation Supine;3X3"   ABduction Supine;3X3"   ADduction Supine;3X3"     Manual Therapy   Manual Therapy Myofascial release   Manual therapy comments manual therapy completed seperately from  all other interventions this date   Myofascial Release myofascial release and manual stretching to right upper arm, scapular, shoulder region and associated areas to decrease pain and restrictions and imrpove pain free mobility in right shoulder.                    OT Short Term Goals - 10/05/16 0856      OT SHORT TERM GOAL #1   Title Patient will be educated on a HEP for right shoulder ROM and strength.   Time 6   Period Weeks   Status On-going     OT SHORT TERM GOAL #2   Title Patient will improve right shoulder and elbow P/ROM to [facility] in order to complete B/ADLs with increased independence.   Time 6   Period Weeks   Status On-going     OT SHORT TERM GOAL #3   Title Patient will improve right shoulder and elbow strength to 3+/5 for increased ability to pick up baskets of laundry.   Time 6   Period Weeks   Status On-going     OT SHORT TERM GOAL #4   Title Patient will decrease right shoulder pain to 4/10 or better in order to complete B/ADLs with greater independence.   Time 6   Period Weeks   Status On-going     OT SHORT TERM GOAL #5   Title Patient will decrease fascial restrictions to min amount in right shoulder region for greater mobility needed for greater use of right arm as dominant.    Time 6   Period Weeks   Status On-going           OT Long Term Goals - 10/05/16 0857      OT LONG TERM GOAL #1   Title Patient will return to prior level of independence with all B/IADLs, work, and leisure activities using her right arm as dominant.    Time 12   Period Weeks   Status On-going     OT LONG TERM GOAL #2   Title Patient will improve right shoulder and elbow A/ROM to WNL for greater ability to use right arm as dominant at work and home.    Time 12   Period Weeks   Status On-going     OT LONG TERM GOAL #3   Title Patient will improve right shoulder and elbow strength to 5/5 for greater ability to lift bags of mulch when completing yardwork.      Time 12   Period Weeks   Status On-going     OT LONG TERM GOAL #4   Title Patient will decrease right shoulder pain to 2/10 or better when completing functional actiivites.    Time 12   Period Weeks  Status On-going     OT LONG TERM GOAL #5   Title Paitent will decrease right fascial restrictions in right shoulder to trace amount in order to have greater mobility needed for use of right arm with functional activiites.     Time 12   Period Weeks   Status On-going               Plan - 10/05/16 0932    Clinical Impression Statement A: Initiated myofascial release, manual stretching, therapy ball stretches, and pulleys (per MD). Pt did well with all exercises with pain noted at end stretch. Functional ROM was achieved during passive stretching. VC for form and technique as needed.   Plan P: Continue with passive ROM, Pulleys to work on increasing functional mobility needed for daily tasks.      Patient will benefit from skilled therapeutic intervention in order to improve the following deficits and impairments:  Decreased range of motion, Decreased skin integrity, Decreased scar mobility, Decreased strength, Increased muscle spasms, Increased fascial restricitons, Impaired UE functional use, Pain  Visit Diagnosis: Stiffness of right shoulder, not elsewhere classified  Other symptoms and signs involving the musculoskeletal system  Right shoulder pain, unspecified chronicity    Problem List Patient Active Problem List   Diagnosis Date Noted  . Nontraumatic incomplete tear of right rotator cuff 09/25/2016  . Foreign body of shoulder right  09/25/2016  . 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 Shoulder joint painful on movement, right 08/29/2016  . Epigastric pain 08/11/2015  . Chest pain, rule out acute myocardial infarction 08/11/2015  . Renal insufficiency 08/11/2015  . Venous stasis 04/13/2015  . Depression 04/13/2015  . Essential hypertension 09/12/2014   . PVCs (premature ventricular contractions) 09/07/2014  . Precordial pain 09/07/2014   Ailene Ravel, OTR/L,CBIS  [phone]  10/05/2016, 9:35 AM  [place], [place], [address] Phone: [phone]   Fax:  [phone]  Name: TAMIAH ROBELLO MRN: [id number] Date of Birth: 10-Dec-1950

## 2016-10-10 ENCOUNTER — Ambulatory Visit (HOSPITAL_COMMUNITY): Payer: 59

## 2016-10-10 ENCOUNTER — Telehealth (HOSPITAL_COMMUNITY): Payer: Self-pay

## 2016-10-10 ENCOUNTER — Encounter (HOSPITAL_COMMUNITY): Payer: Self-pay

## 2016-10-10 DIAGNOSIS — R29898 Other symptoms and signs involving the musculoskeletal system: Secondary | ICD-10-CM | POA: Diagnosis not present

## 2016-10-10 DIAGNOSIS — J019 Acute sinusitis, unspecified: Secondary | ICD-10-CM | POA: Diagnosis not present

## 2016-10-10 DIAGNOSIS — M25611 Stiffness of right shoulder, not elsewhere classified: Secondary | ICD-10-CM

## 2016-10-10 DIAGNOSIS — M25511 Pain in right shoulder: Secondary | ICD-10-CM

## 2016-10-10 NOTE — Telephone Encounter (Signed)
Called and left a message at Dr. Harlen Labs office to verify any precautions for Jennifer Coleman after she second procedure.  Ailene Ravel, OTR/L,CBIS  [phone]

## 2016-10-10 NOTE — Therapy (Signed)
[place], [place], [address] Phone: [phone]   Fax:  [phone]  Occupational Therapy Treatment  Patient Details  Name: Jennifer Coleman MRN: [id number] Date of Birth: 07/11/1951 Referring Provider: Joni Fears, MD  Encounter Date: 10/10/2016      OT End of Session - 10/10/16 0858    Visit Number 3   Number of Visits 24   Date for OT Re-Evaluation 12/01/16  mini reassess: 10/31/15   Authorization Type UMR $20.00 copay   OT Start Time 0825   OT Stop Time 0900   OT Time Calculation (min) 35 min   Activity Tolerance Patient tolerated treatment well   Behavior During Therapy [facility] for tasks assessed/performed      Past Medical History:  Diagnosis Date  . Anxiety   . Arthritis   . Family history of anesthesia complication 25 yrs ago   Sister allergic to sccinocholine - unable to come off ventilator  . Hypertension   . Pneumonia   . PONV (postoperative nausea and vomiting)   . Premature ventricular contractions   . Shingles   . Varicose veins   . Varicose veins of left lower extremity     Past Surgical History:  Procedure Laterality Date  . CARPAL TUNNEL RELEASE    . CERVICAL DISC SURGERY  1992   Fusion  . COLONOSCOPY N/A 06/07/2016   Procedure: COLONOSCOPY;  Surgeon: Rogene Houston, MD;  [facility];  Service: Endoscopy;  Laterality: N/A;  1:00  . KNEE ARTHROSCOPY  09/16/2012   Procedure: ARTHROSCOPY KNEE;  Surgeon: [name], MD;  Location: [place];  Service: Orthopedics;  Laterality: Right;  Right Knee Arthroscopy  . SALPINGOOPHORECTOMY Left 1985  . SHOULDER ARTHROSCOPY WITH OPEN ROTATOR CUFF REPAIR AND DISTAL CLAVICLE ACROMINECTOMY Right 05/03/2016   Procedure: RIGHT SHOULDER ARTHROSCOPY WITH MINI-OPEN ROTATOR CUFF REPAIR,DISTAL CLAVICLE RESECTION, SUBACROMIAL DECOMPRESSION.;  Surgeon: [name], MD;  Location: [place];  Service: Orthopedics;  Laterality:  Right;  . SHOULDER ARTHROSCOPY WITH ROTATOR CUFF REPAIR Right 09/25/2016   Procedure: Right Shoulder Manipulation, Arthroscopic Debridement of Joint, Removal of Loose Body and Re-Repair of Rotator Cuff;  Surgeon: [name], MD;  Location: [place];  Service: Orthopedics;  Laterality: Right;    There were no vitals filed for this visit.      Subjective Assessment - 10/10/16 0849    Subjective  S: I've been doing the wall walk.    Currently in Pain? Yes   Pain Score 6    Pain Location Shoulder   Pain Orientation Right   Pain Descriptors / Indicators Sore;Tender   Pain Type Surgical pain   Pain Radiating Towards up neck   Pain Onset In the past 7 days   Pain Frequency Constant   Aggravating Factors  moving it.   Pain Relieving Factors relaxing, ice, pain meds, stretching   Effect of Pain on Daily Activities Pt is unable to use RUE for daily tasks at this time.   Multiple Pain Sites No            [facility] OT Assessment - 10/10/16 0851      Assessment   Diagnosis manipulation right shoulder, arthroscopic debridement gleno-humeral joint with synovectomy,mini open rotator cuff tear repair     Precautions   Precautions Shoulder   Type of Shoulder Precautions P/ROM for 4 weeks with sling on for 4-6 weeks. Progress to AA/ROM on 10/23/16 . progress to A/ROM  on 11/21/15                  OT Treatments/Exercises (OP) - 10/10/16 0852      Exercises   Exercises Shoulder     Shoulder Exercises: Supine   Protraction PROM;5 reps   Horizontal ABduction PROM;5 reps   External Rotation PROM;5 reps   Internal Rotation PROM;5 reps   Flexion PROM;5 reps   ABduction PROM;5 reps     Shoulder Exercises: ROM/Strengthening   Thumb Tacks 1'     Shoulder Exercises: Isometric Strengthening   Flexion Supine  3x10"   Extension Supine  3x10"   External Rotation Supine  3x10"   Internal Rotation Supine  3x10"   ABduction Supine  3x10"   ADduction Supine  3x10"     Manual  Therapy   Manual Therapy Myofascial release   Manual therapy comments manual therapy completed seperately from all other interventions this date   Myofascial Release myofascial release and manual stretching to right upper arm, scapular, shoulder region and associated areas to decrease pain and restrictions and imrpove pain free mobility in right shoulder.                    OT Short Term Goals - 10/05/16 0856      OT SHORT TERM GOAL #1   Title Patient will be educated on a HEP for right shoulder ROM and strength.   Time 6   Period Weeks   Status On-going     OT SHORT TERM GOAL #2   Title Patient will improve right shoulder and elbow P/ROM to [facility] in order to complete B/ADLs with increased independence.   Time 6   Period Weeks   Status On-going     OT SHORT TERM GOAL #3   Title Patient will improve right shoulder and elbow strength to 3+/5 for increased ability to pick up baskets of laundry.   Time 6   Period Weeks   Status On-going     OT SHORT TERM GOAL #4   Title Patient will decrease right shoulder pain to 4/10 or better in order to complete B/ADLs with greater independence.   Time 6   Period Weeks   Status On-going     OT SHORT TERM GOAL #5   Title Patient will decrease fascial restrictions to min amount in right shoulder region for greater mobility needed for greater use of right arm as dominant.    Time 6   Period Weeks   Status On-going           OT Long Term Goals - 10/05/16 0857      OT LONG TERM GOAL #1   Title Patient will return to prior level of independence with all B/IADLs, work, and leisure activities using her right arm as dominant.    Time 12   Period Weeks   Status On-going     OT LONG TERM GOAL #2   Title Patient will improve right shoulder and elbow A/ROM to WNL for greater ability to use right arm as dominant at work and home.    Time 12   Period Weeks   Status On-going     OT LONG TERM GOAL #3   Title Patient will improve  right shoulder and elbow strength to 5/5 for greater ability to lift bags of mulch when completing yardwork.     Time 12   Period Weeks   Status On-going     OT LONG TERM  GOAL #4   Title Patient will decrease right shoulder pain to 2/10 or better when completing functional actiivites.    Time 12   Period Weeks   Status On-going     OT LONG TERM GOAL #5   Title Paitent will decrease right fascial restrictions in right shoulder to trace amount in order to have greater mobility needed for use of right arm with functional activiites.     Time 12   Period Weeks   Status On-going               Plan - 10/10/16 0933    Clinical Impression Statement A: Pt reports that she is doing wall walks and the pulleys at home. Pt continues to have some pain and tenderness at end range stretching. Able to tolerate P/ROM to [facility].    Plan P: Call Dr. Durward Fortes and ask if he would like [place] to progress patient as tolerated or if we should use a standard RTC repair protocol.       Patient will benefit from skilled therapeutic intervention in order to improve the following deficits and impairments:  Decreased range of motion, Decreased skin integrity, Decreased scar mobility, Decreased strength, Increased muscle spasms, Increased fascial restricitons, Impaired UE functional use, Pain  Visit Diagnosis: Stiffness of right shoulder, not elsewhere classified  Other symptoms and signs involving the musculoskeletal system  Right shoulder pain, unspecified chronicity    Problem List Patient Active Problem List   Diagnosis Date Noted  . Nontraumatic incomplete tear of right rotator cuff 09/25/2016  . Foreign body of shoulder right  09/25/2016  . 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 Shoulder joint painful on movement, right 08/29/2016  . Epigastric pain 08/11/2015  . Chest pain, rule out acute myocardial infarction 08/11/2015  . Renal insufficiency 08/11/2015  . Venous stasis 04/13/2015   . Depression 04/13/2015  . Essential hypertension 09/12/2014  . PVCs (premature ventricular contractions) 09/07/2014  . Precordial pain 09/07/2014   Ailene Ravel, OTR/L,CBIS  [phone]  10/10/2016, 11:53 AM  [place] [address] [place], [place], [address] Phone: [phone]   Fax:  [phone]  Name: Jennifer Coleman MRN: [id number] Date of Birth: 1951-10-15

## 2016-10-11 ENCOUNTER — Other Ambulatory Visit: Payer: Self-pay | Admitting: Physician Assistant

## 2016-10-11 DIAGNOSIS — D492 Neoplasm of unspecified behavior of bone, soft tissue, and skin: Secondary | ICD-10-CM | POA: Diagnosis not present

## 2016-10-11 DIAGNOSIS — L570 Actinic keratosis: Secondary | ICD-10-CM | POA: Diagnosis not present

## 2016-10-12 ENCOUNTER — Encounter (HOSPITAL_COMMUNITY): Payer: Self-pay

## 2016-10-12 ENCOUNTER — Ambulatory Visit (HOSPITAL_COMMUNITY): Payer: 59

## 2016-10-12 DIAGNOSIS — R29898 Other symptoms and signs involving the musculoskeletal system: Secondary | ICD-10-CM

## 2016-10-12 DIAGNOSIS — M25611 Stiffness of right shoulder, not elsewhere classified: Secondary | ICD-10-CM | POA: Diagnosis not present

## 2016-10-12 DIAGNOSIS — J019 Acute sinusitis, unspecified: Secondary | ICD-10-CM | POA: Diagnosis not present

## 2016-10-12 DIAGNOSIS — M25511 Pain in right shoulder: Secondary | ICD-10-CM

## 2016-10-12 NOTE — Therapy (Signed)
[place], [place], [address] Phone: [phone]   Fax:  [phone]  Occupational Therapy Treatment  Patient Details  Name: Jennifer Coleman MRN: [id number] Date of Birth: 07-22-1951 Referring Provider: Joni Fears, MD  Encounter Date: 10/12/2016      OT End of Session - 10/12/16 0858    Visit Number 4   Number of Visits 24   Date for OT Re-Evaluation 12/01/16  mini reassess: 10/31/15   Authorization Type UMR $20.00 copay   OT Start Time 0822   OT Stop Time 0900   OT Time Calculation (min) 38 min   Activity Tolerance Patient tolerated treatment well   Behavior During Therapy [facility] for tasks assessed/performed      Past Medical History:  Diagnosis Date  . Anxiety   . Arthritis   . Family history of anesthesia complication 25 yrs ago   Sister allergic to sccinocholine - unable to come off ventilator  . Hypertension   . Pneumonia   . PONV (postoperative nausea and vomiting)   . Premature ventricular contractions   . Shingles   . Varicose veins   . Varicose veins of left lower extremity     Past Surgical History:  Procedure Laterality Date  . CARPAL TUNNEL RELEASE    . CERVICAL DISC SURGERY  1992   Fusion  . COLONOSCOPY N/A 06/07/2016   Procedure: COLONOSCOPY;  Surgeon: Rogene Houston, MD;  [facility];  Service: Endoscopy;  Laterality: N/A;  1:00  . KNEE ARTHROSCOPY  09/16/2012   Procedure: ARTHROSCOPY KNEE;  Surgeon: [name], MD;  Location: [place];  Service: Orthopedics;  Laterality: Right;  Right Knee Arthroscopy  . SALPINGOOPHORECTOMY Left 1985  . SHOULDER ARTHROSCOPY WITH OPEN ROTATOR CUFF REPAIR AND DISTAL CLAVICLE ACROMINECTOMY Right 05/03/2016   Procedure: RIGHT SHOULDER ARTHROSCOPY WITH MINI-OPEN ROTATOR CUFF REPAIR,DISTAL CLAVICLE RESECTION, SUBACROMIAL DECOMPRESSION.;  Surgeon: [name], MD;  Location: [place];  Service: Orthopedics;  Laterality:  Right;  . SHOULDER ARTHROSCOPY WITH ROTATOR CUFF REPAIR Right 09/25/2016   Procedure: Right Shoulder Manipulation, Arthroscopic Debridement of Joint, Removal of Loose Body and Re-Repair of Rotator Cuff;  Surgeon: [name], MD;  Location: [place];  Service: Orthopedics;  Laterality: Right;    There were no vitals filed for this visit.          [facility] OT Assessment - 10/12/16 0857      Assessment   Diagnosis manipulation right shoulder, arthroscopic debridement gleno-humeral joint with synovectomy,mini open rotator cuff tear repair     Precautions   Precautions Shoulder   Type of Shoulder Precautions P/ROM for 4 weeks with sling on for 4-6 weeks. Progress to AA/ROM on 10/23/16 . progress to A/ROM on 11/21/15                  OT Treatments/Exercises (OP) - 10/12/16 0848      Exercises   Exercises Shoulder     Shoulder Exercises: Supine   Protraction PROM;10 reps   Horizontal ABduction PROM;10 reps   External Rotation PROM;10 reps   Internal Rotation PROM;10 reps   Flexion PROM;10 reps   ABduction PROM;10 reps     Shoulder Exercises: Seated   Elevation AROM;10 reps   Extension AROM;10 reps     Shoulder Exercises: Therapy Ball   Flexion 15 reps   ABduction 15 reps     Shoulder Exercises: Isometric Strengthening   Flexion Supine  3x10"  Extension Supine  3x10"   External Rotation Supine  3x10"   Internal Rotation Supine  3x10"   ABduction Supine  3x10"   ADduction Supine  3x10"     Manual Therapy   Manual Therapy Myofascial release   Manual therapy comments manual therapy completed seperately from all other interventions this date   Myofascial Release myofascial release and manual stretching to right upper arm, scapular, shoulder region and associated areas to decrease pain and restrictions and imrpove pain free mobility in right shoulder.                    OT Short Term Goals - 10/05/16 0856      OT SHORT TERM GOAL #1   Title  Patient will be educated on a HEP for right shoulder ROM and strength.   Time 6   Period Weeks   Status On-going     OT SHORT TERM GOAL #2   Title Patient will improve right shoulder and elbow P/ROM to [employer] in order to complete B/ADLs with increased independence.   Time 6   Period Weeks   Status On-going     OT SHORT TERM GOAL #3   Title Patient will improve right shoulder and elbow strength to 3+/5 for increased ability to pick up baskets of laundry.   Time 6   Period Weeks   Status On-going     OT SHORT TERM GOAL #4   Title Patient will decrease right shoulder pain to 4/10 or better in order to complete B/ADLs with greater independence.   Time 6   Period Weeks   Status On-going     OT SHORT TERM GOAL #5   Title Patient will decrease fascial restrictions to min amount in right shoulder region for greater mobility needed for greater use of right arm as dominant.    Time 6   Period Weeks   Status On-going           OT Long Term Goals - 10/05/16 0857      OT LONG TERM GOAL #1   Title Patient will return to prior level of independence with all B/IADLs, work, and leisure activities using her right arm as dominant.    Time 12   Period Weeks   Status On-going     OT LONG TERM GOAL #2   Title Patient will improve right shoulder and elbow A/ROM to WNL for greater ability to use right arm as dominant at work and home.    Time 12   Period Weeks   Status On-going     OT LONG TERM GOAL #3   Title Patient will improve right shoulder and elbow strength to 5/5 for greater ability to lift bags of mulch when completing yardwork.     Time 12   Period Weeks   Status On-going     OT LONG TERM GOAL #4   Title Patient will decrease right shoulder pain to 2/10 or better when completing functional actiivites.    Time 12   Period Weeks   Status On-going     OT LONG TERM GOAL #5   Title Paitent will decrease right fascial restrictions in right shoulder to trace amount in order to  have greater mobility needed for use of right arm with functional activiites.     Time 12   Period Weeks   Status On-going               Plan - 10/12/16 ID:[id number]  Clinical Impression Statement A: Pt reports increased pain this session with reports of a trigger point pain under scar at medial deltoid. Able to tolerate P/ROM this session. No word from Dr. Rudene Anda office yet. Pt was given a modification for thumb tacks this session and she was able to complete with significantly less pain.   Plan P: Measure for MD appointment.      Patient will benefit from skilled therapeutic intervention in order to improve the following deficits and impairments:  Decreased range of motion, Decreased skin integrity, Decreased scar mobility, Decreased strength, Increased muscle spasms, Increased fascial restricitons, Impaired UE functional use, Pain  Visit Diagnosis: Stiffness of right shoulder, not elsewhere classified  Other symptoms and signs involving the musculoskeletal system  Right shoulder pain, unspecified chronicity    Problem List Patient Active Problem List   Diagnosis Date Noted  . Nontraumatic incomplete tear of right rotator cuff 09/25/2016  . Foreign body of shoulder right  09/25/2016  . 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 Shoulder joint painful on movement, right 08/29/2016  . Epigastric pain 08/11/2015  . Chest pain, rule out acute myocardial infarction 08/11/2015  . Renal insufficiency 08/11/2015  . Venous stasis 04/13/2015  . Depression 04/13/2015  . Essential hypertension 09/12/2014  . PVCs (premature ventricular contractions) 09/07/2014  . Precordial pain 09/07/2014   Ailene Ravel, OTR/L,CBIS  [phone]  10/12/2016, 9:10 AM  [place], [place], [address] Phone: [phone]   Fax:  [phone]  Name: Jennifer Coleman MRN: [id number] Date of Birth: [birth year]

## 2016-10-16 ENCOUNTER — Ambulatory Visit (HOSPITAL_COMMUNITY): Payer: 59

## 2016-10-16 ENCOUNTER — Encounter (HOSPITAL_COMMUNITY): Payer: Self-pay

## 2016-10-16 DIAGNOSIS — M25511 Pain in right shoulder: Secondary | ICD-10-CM

## 2016-10-16 DIAGNOSIS — R29898 Other symptoms and signs involving the musculoskeletal system: Secondary | ICD-10-CM | POA: Diagnosis not present

## 2016-10-16 DIAGNOSIS — J019 Acute sinusitis, unspecified: Secondary | ICD-10-CM | POA: Diagnosis not present

## 2016-10-16 DIAGNOSIS — M25611 Stiffness of right shoulder, not elsewhere classified: Secondary | ICD-10-CM

## 2016-10-16 NOTE — Therapy (Signed)
[place], [place], [address] Phone: [phone]   Fax:  [phone]  Occupational Therapy Treatment  Patient Details  Name: Jennifer Coleman MRN: [id number] Date of Birth: [birth year] Referring Provider: Joni Fears, MD  Encounter Date: 10/16/2016      OT End of Session - 10/16/16 0854    Visit Number 5   Number of Visits 24   Date for OT Re-Evaluation 12/01/16  mini reassess: 10/31/15   Authorization Type UMR $20.00 copay   OT Start Time 0820   OT Stop Time 0900   OT Time Calculation (min) 40 min   Activity Tolerance Patient tolerated treatment well   Behavior During Therapy [facility] for tasks assessed/performed      Past Medical History:  Diagnosis Date  . Anxiety   . Arthritis   . Family history of anesthesia complication 25 yrs ago   Sister allergic to sccinocholine - unable to come off ventilator  . Hypertension   . Pneumonia   . PONV (postoperative nausea and vomiting)   . Premature ventricular contractions   . Shingles   . Varicose veins   . Varicose veins of left lower extremity     Past Surgical History:  Procedure Laterality Date  . CARPAL TUNNEL RELEASE    . CERVICAL DISC SURGERY  1992   Fusion  . COLONOSCOPY N/A 06/07/2016   Procedure: COLONOSCOPY;  Surgeon: Rogene Houston, MD;  [facility];  Service: Endoscopy;  Laterality: N/A;  1:00  . KNEE ARTHROSCOPY  09/16/2012   Procedure: ARTHROSCOPY KNEE;  Surgeon: [name], MD;  Location: [place];  Service: Orthopedics;  Laterality: Right;  Right Knee Arthroscopy  . SALPINGOOPHORECTOMY Left 1985  . SHOULDER ARTHROSCOPY WITH OPEN ROTATOR CUFF REPAIR AND DISTAL CLAVICLE ACROMINECTOMY Right 05/03/2016   Procedure: RIGHT SHOULDER ARTHROSCOPY WITH MINI-OPEN ROTATOR CUFF REPAIR,DISTAL CLAVICLE RESECTION, SUBACROMIAL DECOMPRESSION.;  Surgeon: [name], MD;  Location: [place];  Service: Orthopedics;  Laterality:  Right;  . SHOULDER ARTHROSCOPY WITH ROTATOR CUFF REPAIR Right 09/25/2016   Procedure: Right Shoulder Manipulation, Arthroscopic Debridement of Joint, Removal of Loose Body and Re-Repair of Rotator Cuff;  Surgeon: [name], MD;  Location: [place];  Service: Orthopedics;  Laterality: Right;    There were no vitals filed for this visit.      Subjective Assessment - 10/16/16 0852    Subjective  S: It just feels tight today.   Currently in Pain? Yes   Pain Score 5    Pain Location Shoulder   Pain Orientation Right   Pain Descriptors / Indicators Sore;Tender   Pain Type Surgical pain            [facility] OT Assessment - 10/16/16 0831      Assessment   Diagnosis manipulation right shoulder, arthroscopic debridement gleno-humeral joint with synovectomy,mini open rotator cuff tear repair     Precautions   Precautions Shoulder   Type of Shoulder Precautions P/ROM for 4 weeks with sling on for 4-6 weeks. Progress to AA/ROM on 10/23/16 . progress to A/ROM on 11/21/15     Palpation   Palpation comment Min fascial restrictions in right upper arm, trapezius, and scapularis region.     AROM   Overall AROM  Unable to assess;Due to precautions     PROM   Overall PROM Comments Assessed supine. IR/er adducted   PROM Assessment Site Shoulder   Right/Left Shoulder Right   Right Shoulder  Flexion 153 Degrees  previous: 94   Right Shoulder ABduction 180 Degrees  previous: 82   Right Shoulder Internal Rotation 90 Degrees  previous: same   Right Shoulder External Rotation 50 Degrees  prevous: 25     Strength   Overall Strength Unable to assess;Due to precautions                  OT Treatments/Exercises (OP) - 10/16/16 0836      Exercises   Exercises Shoulder     Shoulder Exercises: Supine   Protraction PROM;10 reps   Horizontal ABduction PROM;10 reps   External Rotation PROM;10 reps   Internal Rotation PROM;10 reps   Flexion PROM;10 reps   ABduction PROM;10 reps      Shoulder Exercises: Therapy Ball   Flexion 15 reps   ABduction 15 reps     Shoulder Exercises: ROM/Strengthening   Thumb Tacks 1'  low level   Prot/Ret//Elev/Dep 1'     Shoulder Exercises: Isometric Strengthening   Flexion Supine  3x10"   Extension Supine  3x10'   External Rotation Supine  3x10'   Internal Rotation Supine  3x10'   ABduction Supine  3x10'   ADduction Supine  3x10'     Shoulder Exercises: Stretch   External Rotation Stretch 3 reps;10 seconds     Manual Therapy   Manual Therapy Myofascial release   Manual therapy comments manual therapy completed seperately from all other interventions this date   Myofascial Release myofascial release and manual stretching to right upper arm, scapular, shoulder region and associated areas to decrease pain and restrictions and imrpove pain free mobility in right shoulder.                    OT Short Term Goals - 10/05/16 0856      OT SHORT TERM GOAL #1   Title Patient will be educated on a HEP for right shoulder ROM and strength.   Time 6   Period Weeks   Status On-going     OT SHORT TERM GOAL #2   Title Patient will improve right shoulder and elbow P/ROM to [facility] in order to complete B/ADLs with increased independence.   Time 6   Period Weeks   Status On-going     OT SHORT TERM GOAL #3   Title Patient will improve right shoulder and elbow strength to 3+/5 for increased ability to pick up baskets of laundry.   Time 6   Period Weeks   Status On-going     OT SHORT TERM GOAL #4   Title Patient will decrease right shoulder pain to 4/10 or better in order to complete B/ADLs with greater independence.   Time 6   Period Weeks   Status On-going     OT SHORT TERM GOAL #5   Title Patient will decrease fascial restrictions to min amount in right shoulder region for greater mobility needed for greater use of right arm as dominant.    Time 6   Period Weeks   Status On-going           OT Long Term Goals  - 10/05/16 0857      OT LONG TERM GOAL #1   Title Patient will return to prior level of independence with all B/IADLs, work, and leisure activities using her right arm as dominant.    Time 12   Period Weeks   Status On-going     OT LONG TERM GOAL #2   Title  Patient will improve right shoulder and elbow A/ROM to WNL for greater ability to use right arm as dominant at work and home.    Time 12   Period Weeks   Status On-going     OT LONG TERM GOAL #3   Title Patient will improve right shoulder and elbow strength to 5/5 for greater ability to lift bags of mulch when completing yardwork.     Time 12   Period Weeks   Status On-going     OT LONG TERM GOAL #4   Title Patient will decrease right shoulder pain to 2/10 or better when completing functional actiivites.    Time 12   Period Weeks   Status On-going     OT LONG TERM GOAL #5   Title Paitent will decrease right fascial restrictions in right shoulder to trace amount in order to have greater mobility needed for use of right arm with functional activiites.     Time 12   Period Weeks   Status On-going               Plan - 10/16/16 0223    Clinical Impression Statement A: Measurements taken for MD appointment. Pt is progressing towards goals and has P/ROM measurements within functional range. Added pro/ret/elev/dep with VC for form and technique.   Plan P: Progress to AA/ROM supine.      Patient will benefit from skilled therapeutic intervention in order to improve the following deficits and impairments:  Decreased range of motion, Decreased skin integrity, Decreased scar mobility, Decreased strength, Increased muscle spasms, Increased fascial restricitons, Impaired UE functional use, Pain  Visit Diagnosis: Stiffness of right shoulder, not elsewhere classified  Other symptoms and signs involving the musculoskeletal system  Right shoulder pain, unspecified chronicity    Problem List Patient Active Problem List    Diagnosis Date Noted  . Nontraumatic incomplete tear of right rotator cuff 09/25/2016  . Foreign body of shoulder right  09/25/2016  . 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 Shoulder joint painful on movement, right 08/29/2016  . Epigastric pain 08/11/2015  . Chest pain, rule out acute myocardial infarction 08/11/2015  . Renal insufficiency 08/11/2015  . Venous stasis 04/13/2015  . Depression 04/13/2015  . Essential hypertension 09/12/2014  . PVCs (premature ventricular contractions) 09/07/2014  . Precordial pain 09/07/2014   Ailene Ravel, OTR/L,CBIS  [phone]  10/16/2016, 8:59 AM  [place], [place], [address] Phone: [phone]   Fax:  [phone]  Name: MAHRUKH SEGUIN MRN: [id number] Date of Birth: 1950-11-24

## 2016-10-17 ENCOUNTER — Ambulatory Visit (INDEPENDENT_AMBULATORY_CARE_PROVIDER_SITE_OTHER): Payer: 59 | Admitting: Orthopaedic Surgery

## 2016-10-17 ENCOUNTER — Encounter (INDEPENDENT_AMBULATORY_CARE_PROVIDER_SITE_OTHER): Payer: Self-pay | Admitting: Orthopaedic Surgery

## 2016-10-17 ENCOUNTER — Telehealth (HOSPITAL_COMMUNITY): Payer: Self-pay | Admitting: Family Medicine

## 2016-10-17 VITALS — BP 116/72 | HR 84 | Ht 66.0 in | Wt 220.0 lb

## 2016-10-17 DIAGNOSIS — G8929 Other chronic pain: Secondary | ICD-10-CM

## 2016-10-17 DIAGNOSIS — M25511 Pain in right shoulder: Secondary | ICD-10-CM

## 2016-10-17 NOTE — Telephone Encounter (Signed)
10/17/16 Marcie Bal with [employer] called to say that they were seeing Everleaner today and that we faxed info to them to increase ROM but they wanted to give [place] a different fax # because those reports hadn't been received..... [id number]

## 2016-10-17 NOTE — Progress Notes (Signed)
Office Visit Note   Patient: Jennifer Coleman           Date of Birth: [birth year]           MRN: [id number] Visit Date: 10/17/2016              Requested by: Mikey Kirschner, MD [place] [address], [place] [address] PCP: Mickie Hillier, MD   Assessment & Plan: Visit Diagnoses: 3 weeks status post exploration of right shoulder with re-repair of rotator cuff tear and removal of extruded plastic anchor. [name] is doing much better with less pain and better motion. She continues to be followed with physical therapy. She is not working.  Plan: Office 1 month. I would hope that she can return to work at that point. She will begin active assisted exercises next week Follow-Up Instructions: No Follow-up on file.   Orders:  No orders of the defined types were placed in this encounter.  No orders of the defined types were placed in this encounter.     Procedures: No procedures performed   Clinical Data: No additional findings.   Subjective: No chief complaint on file.   Pt doing well after Right shoulder surgery.  Pt states she has intermitent pain and still has limited ROM   I called her PT to send me new orders and add our new fax number    Review of Systems   Objective: Vital Signs: Ht '5\' 6"'$  (1.676 m)   Wt 220 lb (99.8 kg)   BMI 35.51 kg/m   Physical Exam  Ortho Exam right shoulder wounds have healed nicely without evidence of infection. Still some pain in the lateral subacromial region with abduction. Muscles are weak. Still lacks about 30 of full 6 flexion  Specialty Comments:  No specialty comments available.  Imaging: No results found.   PMFS History: Patient Active Problem List   Diagnosis Date Noted  . Nontraumatic incomplete tear of right rotator cuff 09/25/2016  . Foreign body of shoulder right  09/25/2016  . 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 Shoulder joint painful on movement, right 08/29/2016  . Epigastric pain  08/11/2015  . Chest pain, rule out acute myocardial infarction 08/11/2015  . Renal insufficiency 08/11/2015  . Venous stasis 04/13/2015  . Depression 04/13/2015  . Essential hypertension 09/12/2014  . PVCs (premature ventricular contractions) 09/07/2014  . Precordial pain 09/07/2014   Past Medical History:  Diagnosis Date  . Anxiety   . Arthritis   . Family history of anesthesia complication 25 yrs ago   Sister allergic to sccinocholine - unable to come off ventilator  . Hypertension   . Pneumonia   . PONV (postoperative nausea and vomiting)   . Premature ventricular contractions   . Shingles   . Varicose veins   . Varicose veins of left lower extremity     Family History  Problem Relation Age of Onset  . Heart disease Sister   . Heart disease Brother     73's  . Diabetes Mellitus II Sister   . Multiple myeloma Mother   . Hypertension Sister   . Hypertension Father   . Heart attack Father     Past Surgical History:  Procedure Laterality Date  . CARPAL TUNNEL RELEASE    . CERVICAL DISC SURGERY  1992   Fusion  . COLONOSCOPY N/A 06/07/2016   Procedure: COLONOSCOPY;  Surgeon: Rogene Houston, MD;  [facility];  Service: Endoscopy;  Laterality: N/A;  1:00  . KNEE ARTHROSCOPY  09/16/2012   Procedure: ARTHROSCOPY KNEE;  Surgeon: [name], MD;  Location: [facility];  Service: Orthopedics;  Laterality: Right;  Right Knee Arthroscopy  . SALPINGOOPHORECTOMY Left 1985  . SHOULDER ARTHROSCOPY WITH OPEN ROTATOR CUFF REPAIR AND DISTAL CLAVICLE ACROMINECTOMY Right 05/03/2016   Procedure: RIGHT SHOULDER ARTHROSCOPY WITH MINI-OPEN ROTATOR CUFF REPAIR,DISTAL CLAVICLE RESECTION, SUBACROMIAL DECOMPRESSION.;  Surgeon: [name], MD;  Location: [facility];  Service: Orthopedics;  Laterality: Right;  . SHOULDER ARTHROSCOPY WITH ROTATOR CUFF REPAIR Right 09/25/2016   Procedure: Right Shoulder Manipulation, Arthroscopic Debridement of Joint, Removal of Loose  Body and Re-Repair of Rotator Cuff;  Surgeon: [name], MD;  Location: [facility];  Service: Orthopedics;  Laterality: Right;   Social History   Occupational History  . Not on file.   Social History Main Topics  . Smoking status: Former Smoker    Types: Cigarettes  . Smokeless tobacco: Former Systems developer    Quit date: 01/12/1997  . Alcohol use No  . Drug use: No  . Sexual activity: Yes

## 2016-10-19 ENCOUNTER — Other Ambulatory Visit: Payer: Self-pay | Admitting: Family Medicine

## 2016-10-19 ENCOUNTER — Ambulatory Visit (HOSPITAL_COMMUNITY): Payer: 59 | Admitting: Specialist

## 2016-10-19 DIAGNOSIS — M25511 Pain in right shoulder: Secondary | ICD-10-CM

## 2016-10-19 DIAGNOSIS — R29898 Other symptoms and signs involving the musculoskeletal system: Secondary | ICD-10-CM | POA: Diagnosis not present

## 2016-10-19 DIAGNOSIS — J019 Acute sinusitis, unspecified: Secondary | ICD-10-CM | POA: Diagnosis not present

## 2016-10-19 DIAGNOSIS — M25611 Stiffness of right shoulder, not elsewhere classified: Secondary | ICD-10-CM | POA: Diagnosis not present

## 2016-10-19 MED FILL — ESTRADIOL-NORETH 0.5-0.1 MG: 0.5-0.1 | 84 days supply | Qty: 84 | Fill #1

## 2016-10-19 MED FILL — ENALAPRIL MALEATE 20 MG TAB: 20 | 90 days supply | Qty: 180 | Fill #1

## 2016-10-19 MED FILL — ESCITALOPRAM 20 MG TABLET: 20 | 30 days supply | Qty: 30 | Fill #0

## 2016-10-19 MED FILL — HYDROCHLOROTHIAZIDE 25 MG T: 25 | 90 days supply | Qty: 90 | Fill #1

## 2016-10-19 NOTE — Therapy (Signed)
[place], [place], [address] Phone: [phone]   Fax:  [phone]  Occupational Therapy Treatment  Patient Details  Name: Jennifer Coleman MRN: [id number] Date of Birth: Jun 20, 1951 Referring Provider: Joni Fears, MD  Encounter Date: 10/19/2016      OT End of Session - 10/19/16 1512    Visit Number 6   Number of Visits 24   Date for OT Re-Evaluation 12/01/16  mini reassess on 10/31/15   Authorization Type [employer] [id number] copay   OT Start Time 0905   OT Stop Time 0945   OT Time Calculation (min) 40 min   Activity Tolerance Patient tolerated treatment well   Behavior During Therapy [facility] for tasks assessed/performed      Past Medical History:  Diagnosis Date  . Anxiety   . Arthritis   . Family history of anesthesia complication 25 yrs ago   Sister allergic to sccinocholine - unable to come off ventilator  . Hypertension   . Pneumonia   . PONV (postoperative nausea and vomiting)   . Premature ventricular contractions   . Shingles   . Varicose veins   . Varicose veins of left lower extremity     Past Surgical History:  Procedure Laterality Date  . CARPAL TUNNEL RELEASE    . CERVICAL DISC SURGERY  1992   Fusion  . COLONOSCOPY N/A 06/07/2016   Procedure: COLONOSCOPY;  Surgeon: Rogene Houston, MD;  [facility];  Service: Endoscopy;  Laterality: N/A;  1:00  . KNEE ARTHROSCOPY  09/16/2012   Procedure: ARTHROSCOPY KNEE;  Surgeon: [name], MD;  Location: [facility] [place] [facility];  Service: Orthopedics;  Laterality: Right;  Right Knee Arthroscopy  . SALPINGOOPHORECTOMY Left 1985  . SHOULDER ARTHROSCOPY WITH OPEN ROTATOR CUFF REPAIR AND DISTAL CLAVICLE ACROMINECTOMY Right 05/03/2016   Procedure: RIGHT SHOULDER ARTHROSCOPY WITH MINI-OPEN ROTATOR CUFF REPAIR,DISTAL CLAVICLE RESECTION, SUBACROMIAL DECOMPRESSION.;  Surgeon: [name], MD;  Location: [facility];  Service: Orthopedics;   Laterality: Right;  . SHOULDER ARTHROSCOPY WITH ROTATOR CUFF REPAIR Right 09/25/2016   Procedure: Right Shoulder Manipulation, Arthroscopic Debridement of Joint, Removal of Loose Body and Re-Repair of Rotator Cuff;  Surgeon: [name], MD;  Location: [facility];  Service: Orthopedics;  Laterality: Right;    There were no vitals filed for this visit.      Subjective Assessment - 10/19/16 1511    Subjective  S:  that same spot hurts - the md said the bone was soft there.   Currently in Pain? Yes   Pain Score 5    Pain Location Shoulder   Pain Orientation Right   Pain Descriptors / Indicators Sore   Pain Type Surgical pain   Pain Onset More than a month ago   Pain Frequency Intermittent                      OT Treatments/Exercises (OP) - 10/19/16 0001      Shoulder Exercises: Therapy Ball   Flexion 15 reps   ABduction 15 reps                  OT Short Term Goals - 10/05/16 0856      OT SHORT TERM GOAL #1   Title Patient will be educated on a HEP for right shoulder ROM and strength.   Time 6   Period Weeks   Status On-going     OT SHORT TERM GOAL #  2   Title Patient will improve right shoulder and elbow P/ROM to [facility] in order to complete B/ADLs with increased independence.   Time 6   Period Weeks   Status On-going     OT SHORT TERM GOAL #3   Title Patient will improve right shoulder and elbow strength to 3+/5 for increased ability to pick up baskets of laundry.   Time 6   Period Weeks   Status On-going     OT SHORT TERM GOAL #4   Title Patient will decrease right shoulder pain to 4/10 or better in order to complete B/ADLs with greater independence.   Time 6   Period Weeks   Status On-going     OT SHORT TERM GOAL #5   Title Patient will decrease fascial restrictions to min amount in right shoulder region for greater mobility needed for greater use of right arm as dominant.    Time 6   Period Weeks   Status On-going           OT  Long Term Goals - 10/05/16 0857      OT LONG TERM GOAL #1   Title Patient will return to prior level of independence with all B/IADLs, work, and leisure activities using her right arm as dominant.    Time 12   Period Weeks   Status On-going     OT LONG TERM GOAL #2   Title Patient will improve right shoulder and elbow A/ROM to WNL for greater ability to use right arm as dominant at work and home.    Time 12   Period Weeks   Status On-going     OT LONG TERM GOAL #3   Title Patient will improve right shoulder and elbow strength to 5/5 for greater ability to lift bags of mulch when completing yardwork.     Time 12   Period Weeks   Status On-going     OT LONG TERM GOAL #4   Title Patient will decrease right shoulder pain to 2/10 or better when completing functional actiivites.    Time 12   Period Weeks   Status On-going     OT LONG TERM GOAL #5   Title Paitent will decrease right fascial restrictions in right shoulder to trace amount in order to have greater mobility needed for use of right arm with functional activiites.     Time 12   Period Weeks   Status On-going               Plan - 10/19/16 1513    Clinical Impression Statement A:  Patient demonstrates full P/ROM this date in supine.  Attempted thumbtacks and prot/ret//elev/dep however patient unable to complete due to increased pain.   Plan P: Per protocol, begin AA/ROM in supine, add pvc pipe slide and other AA/ROM functional reaching activities.       Patient will benefit from skilled therapeutic intervention in order to improve the following deficits and impairments:  Decreased range of motion, Decreased skin integrity, Decreased scar mobility, Decreased strength, Increased muscle spasms, Increased fascial restricitons, Impaired UE functional use, Pain  Visit Diagnosis: Stiffness of right shoulder, not elsewhere classified  Other symptoms and signs involving the musculoskeletal system  Right shoulder pain,  unspecified chronicity    Problem List Patient Active Problem List   Diagnosis Date Noted  . Nontraumatic incomplete tear of right rotator cuff 09/25/2016  . Foreign body of shoulder right  09/25/2016  . Adhesive capsulitis of right shoulder  09/25/2016  . Shoulder joint painful on movement, right 08/29/2016  . Epigastric pain 08/11/2015  . Chest pain, rule out acute myocardial infarction 08/11/2015  . Renal insufficiency 08/11/2015  . Venous stasis 04/13/2015  . Depression 04/13/2015  . Essential hypertension 09/12/2014  . PVCs (premature ventricular contractions) 09/07/2014  . Precordial pain 09/07/2014   Vangie Bicker, [place], OTR/L [phone]  10/19/2016, 3:15 PM  [facility] [address] [place], [place], [address] Phone: [phone]   Fax:  [phone]  Name: Jennifer Coleman MRN: [id number] Date of Birth: 1951/10/13

## 2016-10-24 ENCOUNTER — Ambulatory Visit (HOSPITAL_COMMUNITY): Payer: 59 | Admitting: Specialist

## 2016-10-24 DIAGNOSIS — J019 Acute sinusitis, unspecified: Secondary | ICD-10-CM | POA: Diagnosis not present

## 2016-10-24 DIAGNOSIS — M25611 Stiffness of right shoulder, not elsewhere classified: Secondary | ICD-10-CM | POA: Diagnosis not present

## 2016-10-24 DIAGNOSIS — R29898 Other symptoms and signs involving the musculoskeletal system: Secondary | ICD-10-CM

## 2016-10-24 DIAGNOSIS — M25511 Pain in right shoulder: Secondary | ICD-10-CM

## 2016-10-24 NOTE — Therapy (Addendum)
[place], [place], [address] Phone: [phone]   Fax:  [phone]  Occupational Therapy Treatment  Patient Details  Name: Jennifer Coleman MRN: [id number] Date of Birth: [birth year] Referring Provider: Joni Fears, MD  Encounter Date: 10/24/2016      OT End of Session - 10/24/16 1126    Visit Number 7   Number of Visits 24   Date for OT Re-Evaluation 12/01/16  mini reassess on 10/30/16   OT Start Time 0913   OT Stop Time 0955   OT Time Calculation (min) 42 min   Activity Tolerance Patient tolerated treatment well   Behavior During Therapy [facility] for tasks assessed/performed      Past Medical History:  Diagnosis Date  . Anxiety   . Arthritis   . Family history of anesthesia complication 25 yrs ago   Sister allergic to sccinocholine - unable to come off ventilator  . Hypertension   . Pneumonia   . PONV (postoperative nausea and vomiting)   . Premature ventricular contractions   . Shingles   . Varicose veins   . Varicose veins of left lower extremity     Past Surgical History:  Procedure Laterality Date  . CARPAL TUNNEL RELEASE    . CERVICAL DISC SURGERY  1992   Fusion  . COLONOSCOPY N/A 06/07/2016   Procedure: COLONOSCOPY;  Surgeon: Rogene Houston, MD;  [facility];  Service: Endoscopy;  Laterality: N/A;  1:00  . KNEE ARTHROSCOPY  09/16/2012   Procedure: ARTHROSCOPY KNEE;  Surgeon: [name], MD;  Location: [facility];  Service: Orthopedics;  Laterality: Right;  Right Knee Arthroscopy  . SALPINGOOPHORECTOMY Left 1985  . SHOULDER ARTHROSCOPY WITH OPEN ROTATOR CUFF REPAIR AND DISTAL CLAVICLE ACROMINECTOMY Right 05/03/2016   Procedure: RIGHT SHOULDER ARTHROSCOPY WITH MINI-OPEN ROTATOR CUFF REPAIR,DISTAL CLAVICLE RESECTION, SUBACROMIAL DECOMPRESSION.;  Surgeon: [name], MD;  Location: [facility];  Service: Orthopedics;  Laterality: Right;  . SHOULDER ARTHROSCOPY WITH  ROTATOR CUFF REPAIR Right 09/25/2016   Procedure: Right Shoulder Manipulation, Arthroscopic Debridement of Joint, Removal of Loose Body and Re-Repair of Rotator Cuff;  Surgeon: [name], MD;  Location: [facility];  Service: Orthopedics;  Laterality: Right;    There were no vitals filed for this visit.      Subjective Assessment - 10/24/16 1125    Subjective  S:  I have a knot by my elbow, it really hurts and makes it difficult to fully straighten my elbow   Currently in Pain? Yes   Pain Score 5    Pain Location Shoulder   Pain Orientation Right   Pain Descriptors / Indicators Sore   Pain Type Acute pain   Pain Radiating Towards elbow   Pain Onset More than a month ago   Pain Frequency Intermittent   Aggravating Factors  active movement   Pain Relieving Factors rest, ice, streching   Effect of Pain on Daily Activities decreased use of dominant arm with daily tasks          S:  I have a knot on my elbow. Pain 5/10 in anteror shoulder O:  Supine P/ROM and AA/ROM 10 times each flexion, abduction, horizontal abduction, external and internal rotation.  Rhythmic stabilization in supine 120, 90, 45 15 seconds each.  pvc pipe slide 10 times abduction 10 times flexion.  Attempted thumbtacks and wall wash and unable to do so due to pain.   Manual therapy:  Completed seperately from all other interventions this date.  Myofascial release to right upper arm, shoulder, scapular region and associated areas.                         OT Short Term Goals - 10/05/16 0856      OT SHORT TERM GOAL #1   Title Patient will be educated on a HEP for right shoulder ROM and strength.   Time 6   Period Weeks   Status On-going     OT SHORT TERM GOAL #2   Title Patient will improve right shoulder and elbow P/ROM to [facility] in order to complete B/ADLs with increased independence.   Time 6   Period Weeks   Status On-going     OT SHORT TERM GOAL #3   Title Patient will improve right  shoulder and elbow strength to 3+/5 for increased ability to pick up baskets of laundry.   Time 6   Period Weeks   Status On-going     OT SHORT TERM GOAL #4   Title Patient will decrease right shoulder pain to 4/10 or better in order to complete B/ADLs with greater independence.   Time 6   Period Weeks   Status On-going     OT SHORT TERM GOAL #5   Title Patient will decrease fascial restrictions to min amount in right shoulder region for greater mobility needed for greater use of right arm as dominant.    Time 6   Period Weeks   Status On-going           OT Long Term Goals - 10/05/16 0857      OT LONG TERM GOAL #1   Title Patient will return to prior level of independence with all B/IADLs, work, and leisure activities using her right arm as dominant.    Time 12   Period Weeks   Status On-going     OT LONG TERM GOAL #2   Title Patient will improve right shoulder and elbow A/ROM to WNL for greater ability to use right arm as dominant at work and home.    Time 12   Period Weeks   Status On-going     OT LONG TERM GOAL #3   Title Patient will improve right shoulder and elbow strength to 5/5 for greater ability to lift bags of mulch when completing yardwork.     Time 12   Period Weeks   Status On-going     OT LONG TERM GOAL #4   Title Patient will decrease right shoulder pain to 2/10 or better when completing functional actiivites.    Time 12   Period Weeks   Status On-going     OT LONG TERM GOAL #5   Title Paitent will decrease right fascial restrictions in right shoulder to trace amount in order to have greater mobility needed for use of right arm with functional activiites.     Time 12   Period Weeks   Status On-going               Plan - 10/24/16 1127    Clinical Impression Statement A:  Patient with improved P/ROM with decreased pain.  Began AA/ROM per protocol this date.  Patient complaining of pain and knot at elbow, which therapist is able to palpate.      Plan P:  Increase available range wtih pvc pipe slide activity, attempt wall wash for entire minute.  Patient will benefit from skilled therapeutic intervention in order to improve the following deficits and impairments:  Decreased range of motion, Decreased skin integrity, Decreased scar mobility, Decreased strength, Increased muscle spasms, Increased fascial restricitons, Impaired UE functional use, Pain  Visit Diagnosis: Stiffness of right shoulder, not elsewhere classified  Other symptoms and signs involving the musculoskeletal system  Right shoulder pain, unspecified chronicity    Problem List Patient Active Problem List   Diagnosis Date Noted  . Nontraumatic incomplete tear of right rotator cuff 09/25/2016  . Foreign body of shoulder right  09/25/2016  . 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 Shoulder joint painful on movement, right 08/29/2016  . Epigastric pain 08/11/2015  . Chest pain, rule out acute myocardial infarction 08/11/2015  . Renal insufficiency 08/11/2015  . Venous stasis 04/13/2015  . Depression 04/13/2015  . Essential hypertension 09/12/2014  . PVCs (premature ventricular contractions) 09/07/2014  . Precordial pain 09/07/2014    Vangie Bicker, [place], OTR/L [phone]  10/24/2016, 11:30 AM  [place] [address] [place], [place], [address] Phone: [phone]   Fax:  [phone]  Name: Jennifer Coleman MRN: [id number] Date of Birth: [birth year]

## 2016-10-25 ENCOUNTER — Ambulatory Visit: Payer: 59 | Admitting: Nurse Practitioner

## 2016-10-26 ENCOUNTER — Telehealth: Payer: Self-pay | Admitting: Family Medicine

## 2016-10-26 ENCOUNTER — Ambulatory Visit (HOSPITAL_COMMUNITY): Payer: 59 | Admitting: Specialist

## 2016-10-26 ENCOUNTER — Encounter: Payer: Self-pay | Admitting: Family Medicine

## 2016-10-26 ENCOUNTER — Ambulatory Visit (INDEPENDENT_AMBULATORY_CARE_PROVIDER_SITE_OTHER): Payer: 59 | Admitting: Family Medicine

## 2016-10-26 VITALS — BP 130/78 | Temp 98.0°F | Ht 66.0 in | Wt 238.6 lb

## 2016-10-26 DIAGNOSIS — M25611 Stiffness of right shoulder, not elsewhere classified: Secondary | ICD-10-CM

## 2016-10-26 DIAGNOSIS — R29898 Other symptoms and signs involving the musculoskeletal system: Secondary | ICD-10-CM | POA: Diagnosis not present

## 2016-10-26 DIAGNOSIS — J019 Acute sinusitis, unspecified: Secondary | ICD-10-CM | POA: Diagnosis not present

## 2016-10-26 DIAGNOSIS — M25511 Pain in right shoulder: Secondary | ICD-10-CM | POA: Diagnosis not present

## 2016-10-26 DIAGNOSIS — B9689 Other specified bacterial agents as the cause of diseases classified elsewhere: Secondary | ICD-10-CM | POA: Diagnosis not present

## 2016-10-26 DIAGNOSIS — M7711 Lateral epicondylitis, right elbow: Secondary | ICD-10-CM | POA: Diagnosis not present

## 2016-10-26 MED ORDER — DICLOFENAC SODIUM 1 % TD GEL: TRANSDERMAL | 1 refills | Status: DC

## 2016-10-26 MED ORDER — FLUTICASONE PROPIONATE 50 MCG/ACT NA SUSP: 2.0000 | Freq: Every day | NASAL | 11 refills | Status: DC

## 2016-10-26 MED ORDER — CEFDINIR 300 MG PO CAPS: 300.0000 mg | ORAL_CAPSULE | Freq: Two times a day (BID) | ORAL | 0 refills | Status: DC

## 2016-10-26 MED FILL — FLUTICASONE PROP 50 MCG SPR: 50 | 30 days supply | Qty: 16 | Fill #0

## 2016-10-26 NOTE — Telephone Encounter (Signed)
Med sent to pharmacy. Patient was notified.  

## 2016-10-26 NOTE — Therapy (Signed)
[place], [place], [address] Phone: [phone]   Fax:  [phone]  Occupational Therapy Treatment  Patient Details  Name: Jennifer Coleman MRN: [id number] Date of Birth: Dec 21, 1950 Referring Provider: Joni Fears, MD  Encounter Date: 10/26/2016      OT End of Session - 10/26/16 0945    Visit Number 8   Number of Visits 24   Date for OT Re-Evaluation 12/01/16  mini reassess on 10/30/16   OT Start Time 0908   OT Stop Time 0947   OT Time Calculation (min) 39 min   Activity Tolerance Patient tolerated treatment well   Behavior During Therapy [facility] for tasks assessed/performed      Past Medical History:  Diagnosis Date  . Anxiety   . Arthritis   . Family history of anesthesia complication 25 yrs ago   Sister allergic to sccinocholine - unable to come off ventilator  . Hypertension   . Pneumonia   . PONV (postoperative nausea and vomiting)   . Premature ventricular contractions   . Shingles   . Varicose veins   . Varicose veins of left lower extremity     Past Surgical History:  Procedure Laterality Date  . CARPAL TUNNEL RELEASE    . CERVICAL DISC SURGERY  1992   Fusion  . COLONOSCOPY N/A 06/07/2016   Procedure: COLONOSCOPY;  Surgeon: Rogene Houston, MD;  [facility];  Service: Endoscopy;  Laterality: N/A;  1:00  . KNEE ARTHROSCOPY  09/16/2012   Procedure: ARTHROSCOPY KNEE;  Surgeon: [name], MD;  Location: [place];  Service: Orthopedics;  Laterality: Right;  Right Knee Arthroscopy  . SALPINGOOPHORECTOMY Left 1985  . SHOULDER ARTHROSCOPY WITH OPEN ROTATOR CUFF REPAIR AND DISTAL CLAVICLE ACROMINECTOMY Right 05/03/2016   Procedure: RIGHT SHOULDER ARTHROSCOPY WITH MINI-OPEN ROTATOR CUFF REPAIR,DISTAL CLAVICLE RESECTION, SUBACROMIAL DECOMPRESSION.;  Surgeon: [name], MD;  Location: [place];  Service: Orthopedics;  Laterality: Right;  . SHOULDER ARTHROSCOPY WITH  ROTATOR CUFF REPAIR Right 09/25/2016   Procedure: Right Shoulder Manipulation, Arthroscopic Debridement of Joint, Removal of Loose Body and Re-Repair of Rotator Cuff;  Surgeon: [name], MD;  Location: [place];  Service: Orthopedics;  Laterality: Right;    There were no vitals filed for this visit.      Subjective Assessment - 10/26/16 0909    Subjective  S:  It feels tight today.   Currently in Pain? Yes   Pain Score 5    Pain Location Shoulder   Pain Orientation Right   Pain Descriptors / Indicators Tightness   Pain Type Acute pain   Pain Onset More than a month ago            [facility] OT Assessment - 10/26/16 0001      Assessment   Diagnosis manipulation right shoulder, arthroscopic debridement gleno-humeral joint with synovectomy,mini open rotator cuff tear repair     Precautions   Precautions Shoulder   Type of Shoulder Precautions P/ROM for 4 weeks with sling on for 4-6 weeks. Progress to AA/ROM on 10/23/16 . progress to A/ROM on 11/21/15                  OT Treatments/Exercises (OP) - 10/26/16 0001      Exercises   Exercises Shoulder     Shoulder Exercises: Supine   Protraction PROM;10 reps;AAROM;12 reps   Horizontal ABduction PROM;10 reps;AAROM;12 reps   External Rotation PROM;10 reps;AAROM;12 reps  Internal Rotation PROM;10 reps;AAROM;12 reps   Flexion PROM;10 reps;AAROM;12 reps   ABduction PROM;10 reps;AAROM;12 reps     Shoulder Exercises: Seated   Elevation AROM;12 reps   Extension AROM;12 reps   Row AROM;12 reps     Shoulder Exercises: Therapy Ball   Flexion 20 reps   ABduction 20 reps     Shoulder Exercises: ROM/Strengthening   Thumb Tacks 1'   Prot/Ret//Elev/Dep 1'   Other ROM/Strengthening Exercises pvc pipe slide flexion 10 times abduction 10 times      Manual Therapy   Manual Therapy Myofascial release   Manual therapy comments manual therapy completed seperately from all other interventions this date   Myofascial Release  myofascial release and manual stretching to right upper arm, scapular, shoulder region and associated areas to decrease pain and restrictions and imrpove pain free mobility in right shoulder.                    OT Short Term Goals - 10/05/16 0856      OT SHORT TERM GOAL #1   Title Patient will be educated on a HEP for right shoulder ROM and strength.   Time 6   Period Weeks   Status On-going     OT SHORT TERM GOAL #2   Title Patient will improve right shoulder and elbow P/ROM to [facility] in order to complete B/ADLs with increased independence.   Time 6   Period Weeks   Status On-going     OT SHORT TERM GOAL #3   Title Patient will improve right shoulder and elbow strength to 3+/5 for increased ability to pick up baskets of laundry.   Time 6   Period Weeks   Status On-going     OT SHORT TERM GOAL #4   Title Patient will decrease right shoulder pain to 4/10 or better in order to complete B/ADLs with greater independence.   Time 6   Period Weeks   Status On-going     OT SHORT TERM GOAL #5   Title Patient will decrease fascial restrictions to min amount in right shoulder region for greater mobility needed for greater use of right arm as dominant.    Time 6   Period Weeks   Status On-going           OT Long Term Goals - 10/05/16 0857      OT LONG TERM GOAL #1   Title Patient will return to prior level of independence with all B/IADLs, work, and leisure activities using her right arm as dominant.    Time 12   Period Weeks   Status On-going     OT LONG TERM GOAL #2   Title Patient will improve right shoulder and elbow A/ROM to WNL for greater ability to use right arm as dominant at work and home.    Time 12   Period Weeks   Status On-going     OT LONG TERM GOAL #3   Title Patient will improve right shoulder and elbow strength to 5/5 for greater ability to lift bags of mulch when completing yardwork.     Time 12   Period Weeks   Status On-going     OT LONG  TERM GOAL #4   Title Patient will decrease right shoulder pain to 2/10 or better when completing functional actiivites.    Time 12   Period Weeks   Status On-going     OT LONG TERM GOAL #5   Title Paitent  will decrease right fascial restrictions in right shoulder to trace amount in order to have greater mobility needed for use of right arm with functional activiites.     Time 12   Period Weeks   Status On-going               Plan - 10/26/16 0946    Clinical Impression Statement A:  increased to 12 repetitions with AA/ROM in supine this date. focused more on manual therapy this date vs therapeutic exercises.    Plan P:  resume pvc pipe slide, rhythmic stabilization, attempt wall wash.      Patient will benefit from skilled therapeutic intervention in order to improve the following deficits and impairments:     Visit Diagnosis: Stiffness of right shoulder, not elsewhere classified  Other symptoms and signs involving the musculoskeletal system  Right shoulder pain, unspecified chronicity    Problem List Patient Active Problem List   Diagnosis Date Noted  . Nontraumatic incomplete tear of right rotator cuff 09/25/2016  . Foreign body of shoulder right  09/25/2016  . 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 Shoulder joint painful on movement, right 08/29/2016  . Epigastric pain 08/11/2015  . Chest pain, rule out acute myocardial infarction 08/11/2015  . Renal insufficiency 08/11/2015  . Venous stasis 04/13/2015  . Depression 04/13/2015  . Essential hypertension 09/12/2014  . PVCs (premature ventricular contractions) 09/07/2014  . Precordial pain 09/07/2014    Vangie Bicker, [place], OTR/L [phone]  10/26/2016, 9:48 AM  [place] [address] [place], [place], [address] Phone: [phone]   Fax:  [phone]  Name: KAJA JACKOWSKI MRN: [id number] Date of Birth: Apr 01, 1951

## 2016-10-26 NOTE — Telephone Encounter (Signed)
Pt was seen today and forgot to ask for refill on her fluticasone (FLONASE) 50 MCG/ACT nasal spray   Please advise & call pt when done     [facility] - deliver to [facility]

## 2016-10-26 NOTE — Progress Notes (Signed)
   Subjective:    Patient ID: Jennifer Coleman, female    DOB: August 27, 1951, 65 y.o.   MRN: [id number]  Sinus Problem  This is a new problem. The current episode started 1 to 4 weeks ago. Associated symptoms include congestion, coughing, ear pain, headaches, sinus pressure and a sore throat. Pertinent negatives include no shortness of breath. Treatments tried: otc meds.   Patient also with c/o right elbow pain Rotator cuff surg July Did follow up mri Had second surg in nov Past 2 weeks with pain and tenderness and a knot Uses ibuprofen Starting phy therapy   Review of Systems  Constitutional: Positive for fatigue. Negative for activity change and fever.  HENT: Positive for congestion, ear pain, rhinorrhea, sinus pain, sinus pressure and sore throat.   Eyes: Negative for discharge.  Respiratory: Positive for cough. Negative for chest tightness, shortness of breath and wheezing.   Cardiovascular: Negative for chest pain.  Neurological: Positive for headaches.       Objective:   Physical Exam  Constitutional: She appears well-developed.  HENT:  Head: Normocephalic.  Nose: Nose normal.  Mouth/Throat: Oropharynx is clear and moist. No oropharyngeal exudate.  Neck: Neck supple.  Cardiovascular: Normal rate and normal heart sounds.   No murmur heard. Pulmonary/Chest: Effort normal and breath sounds normal. She has no wheezes.  Lymphadenopathy:    She has no cervical adenopathy.  Skin: Skin is warm and dry.  Nursing note and vitals reviewed.         Assessment & Plan:  Elbow tendinitis may use Voltaren gel for up to one week follow-up with orthopedics  Sinusitis antibiotics prescribed warning signs discussed

## 2016-10-26 NOTE — Telephone Encounter (Signed)
Patient may have 12 refills

## 2016-11-07 ENCOUNTER — Telehealth (HOSPITAL_COMMUNITY): Payer: Self-pay

## 2016-11-07 ENCOUNTER — Encounter: Payer: Self-pay | Admitting: Family Medicine

## 2016-11-07 ENCOUNTER — Ambulatory Visit (HOSPITAL_COMMUNITY): Payer: 59

## 2016-11-07 ENCOUNTER — Ambulatory Visit (INDEPENDENT_AMBULATORY_CARE_PROVIDER_SITE_OTHER): Payer: 59 | Admitting: Family Medicine

## 2016-11-07 VITALS — Temp 100.3°F | Ht 66.0 in | Wt 234.6 lb

## 2016-11-07 DIAGNOSIS — J111 Influenza due to unidentified influenza virus with other respiratory manifestations: Secondary | ICD-10-CM

## 2016-11-07 MED ORDER — OSELTAMIVIR PHOSPHATE 75 MG PO CAPS: 75.0000 mg | ORAL_CAPSULE | Freq: Two times a day (BID) | ORAL | 0 refills | Status: DC

## 2016-11-07 MED ORDER — BENZONATATE 100 MG PO CAPS: 100.0000 mg | ORAL_CAPSULE | Freq: Four times a day (QID) | ORAL | 0 refills | Status: DC | PRN

## 2016-11-07 NOTE — Telephone Encounter (Signed)
Patient's husband clled and canceled her appt, she been up all night sick on her stomach.

## 2016-11-07 NOTE — Progress Notes (Signed)
   Subjective:    Patient ID: Jennifer Coleman, female    DOB: 1951/04/08, 66 y.o.   MRN: [id number]  Cough  This is a new problem. The current episode started in the past 7 days. Associated symptoms include ear pain, a fever, headaches, a sore throat and wheezing. Associated symptoms comments: vomiting. Treatments tried: otc meds.    yest felt bad and puny and achey  Terrible cough last night   Chest tight cough really bad, vom with it wit was so bad    Review of Systems  Constitutional: Positive for fever.  HENT: Positive for ear pain and sore throat.   Respiratory: Positive for cough and wheezing.   Neurological: Positive for headaches.       Objective:   Physical Exam Alert moderate malaise hydration good lungs clear. Heart regular rate and rhythm. Intermittent cough during exam       Assessment & Plan:  Impression influenza plan Tamiflu 75 twice a day 5 days. Symptom care discussed Tessalon P

## 2016-11-14 ENCOUNTER — Ambulatory Visit (INDEPENDENT_AMBULATORY_CARE_PROVIDER_SITE_OTHER): Payer: 59 | Admitting: Orthopaedic Surgery

## 2016-11-14 NOTE — Addendum Note (Signed)
Encounter addended by: Annie Paras on: 11/14/2016  1:54 PM<BR>    Actions taken: Imaging Exam ended, Charge Capture section accepted

## 2016-11-15 ENCOUNTER — Encounter (HOSPITAL_COMMUNITY): Payer: 59

## 2016-11-16 ENCOUNTER — Telehealth (HOSPITAL_COMMUNITY): Payer: Self-pay | Admitting: Family Medicine

## 2016-11-16 NOTE — Telephone Encounter (Signed)
11/16/16 I called because her last appt on the schedule was a snow day and I left her a message to call us back if she was wanting to reschedule.

## 2016-11-21 ENCOUNTER — Encounter (INDEPENDENT_AMBULATORY_CARE_PROVIDER_SITE_OTHER): Payer: Self-pay | Admitting: Orthopaedic Surgery

## 2016-11-21 ENCOUNTER — Ambulatory Visit (INDEPENDENT_AMBULATORY_CARE_PROVIDER_SITE_OTHER): Payer: 59 | Admitting: Orthopaedic Surgery

## 2016-11-21 VITALS — BP 127/71 | HR 62 | Ht 66.0 in | Wt 220.0 lb

## 2016-11-21 DIAGNOSIS — G8929 Other chronic pain: Secondary | ICD-10-CM

## 2016-11-21 DIAGNOSIS — M25511 Pain in right shoulder: Secondary | ICD-10-CM

## 2016-11-21 NOTE — Progress Notes (Signed)
Office Visit Note   Patient: Jennifer Coleman           Date of Birth: September 10, 1951           MRN: [id number] Visit Date: 11/21/2016              Requested by: Mikey Kirschner, MD [address] [place], [place] [address] PCP: Mickie Hillier, MD   Assessment & Plan: Visit Diagnoses: Right shoulder repeat surgery on 09/25/2016 has previously outlined. Doing quite well at this point. Continues with physical therapy with some weakness but minimal discomfort.   Plan: Continue physical therapy, note to return to work on February 5, follow-up in office 1 month.  Follow-Up Instructions: No Follow-up on file.   Orders:  No orders of the defined types were placed in this encounter.  No orders of the defined types were placed in this encounter.     Procedures: No procedures performed   Clinical Data: No additional findings.   Subjective: Chief Complaint  Patient presents with  . Right Shoulder - Routine Post Op    Patient returns status post right shoulder manipulation, arthroscopic debridement of joint, removal of loose body, and re-repair of rotator cuff on 09/25/2016. She is 8 weeks 1 day post op. She states that she is getting better. She has had the flu and has not been to physical therapy in two weeks. She is needing a note in regards to when she can go back to work. Her original return to work date was 11/19/16 but the office was closed when she had appt scheduled last week. She is taking ibuprofen for pain with some relief.  Jennifer Coleman is much better after this second surgery. She still is weak but doesn't have the pain that she had preoperatively. She like to return to work on February 5  Review of Systems   Objective: Vital Signs: There were no vitals taken for this visit.  Physical Exam  Ortho Exam right shoulder exam demonstrates the wounds to heal very nicely without evidence of complication. Full passive overhead motion but has some weakness  with active motion. I  could raise her arm over her head and she can maintain that position .Can only actively flex about 90 and abduct about the same. Skin intact. Neurovascular exam is intact. Negative impingement. Negative empty can. No localized areas of tenderness.  Specialty Comments:  No specialty comments available.  Imaging: No results found.   PMFS History: Patient Active Problem List   Diagnosis Date Noted  . Nontraumatic incomplete tear of right rotator cuff 09/25/2016  . Foreign body of shoulder right  09/25/2016  . 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 Shoulder joint painful on movement, right 08/29/2016  . Epigastric pain 08/11/2015  . Chest pain, rule out acute myocardial infarction 08/11/2015  . Renal insufficiency 08/11/2015  . Venous stasis 04/13/2015  . Depression 04/13/2015  . Essential hypertension 09/12/2014  . PVCs (premature ventricular contractions) 09/07/2014  . Precordial pain 09/07/2014   Past Medical History:  Diagnosis Date  . Anxiety   . Arthritis   . Family history of anesthesia complication 25 yrs ago   Sister allergic to sccinocholine - unable to come off ventilator  . Hypertension   . Pneumonia   . PONV (postoperative nausea and vomiting)   . Premature ventricular contractions   . Shingles   . Varicose veins   . Varicose veins of left lower extremity     Family History  Problem Relation Age of Onset  . Heart disease Sister   . Heart disease Brother     19's  . Diabetes Mellitus II Sister   . Multiple myeloma Mother   . Hypertension Sister   . Hypertension Father   . Heart attack Father     Past Surgical History:  Procedure Laterality Date  . CARPAL TUNNEL RELEASE    . CERVICAL DISC SURGERY  1992   Fusion  . COLONOSCOPY N/A 06/07/2016   Procedure: COLONOSCOPY;  Surgeon: Rogene Houston, MD;  [facility];  Service: Endoscopy;  Laterality: N/A;  1:00  . KNEE ARTHROSCOPY  09/16/2012   Procedure: ARTHROSCOPY KNEE;  Surgeon:  [name], MD;  Location: [place];  Service: Orthopedics;  Laterality: Right;  Right Knee Arthroscopy  . SALPINGOOPHORECTOMY Left 1985  . SHOULDER ARTHROSCOPY WITH OPEN ROTATOR CUFF REPAIR AND DISTAL CLAVICLE ACROMINECTOMY Right 05/03/2016   Procedure: RIGHT SHOULDER ARTHROSCOPY WITH MINI-OPEN ROTATOR CUFF REPAIR,DISTAL CLAVICLE RESECTION, SUBACROMIAL DECOMPRESSION.;  Surgeon: [name], MD;  Location: [place];  Service: Orthopedics;  Laterality: Right;  . SHOULDER ARTHROSCOPY WITH ROTATOR CUFF REPAIR Right 09/25/2016   Procedure: Right Shoulder Manipulation, Arthroscopic Debridement of Joint, Removal of Loose Body and Re-Repair of Rotator Cuff;  Surgeon: [name], MD;  Location: [place];  Service: Orthopedics;  Laterality: Right;   Social History   Occupational History  . Not on file.   Social History Main Topics  . Smoking status: Former Smoker    Types: Cigarettes  . Smokeless tobacco: Former Systems developer    Quit date: 01/12/1997  . Alcohol use No  . Drug use: No  . Sexual activity: Yes

## 2016-12-26 ENCOUNTER — Encounter (INDEPENDENT_AMBULATORY_CARE_PROVIDER_SITE_OTHER): Payer: Self-pay | Admitting: Orthopaedic Surgery

## 2016-12-26 ENCOUNTER — Ambulatory Visit (INDEPENDENT_AMBULATORY_CARE_PROVIDER_SITE_OTHER): Payer: 59 | Admitting: Orthopaedic Surgery

## 2016-12-26 VITALS — BP 125/73 | HR 72 | Ht 66.0 in | Wt 220.0 lb

## 2016-12-26 DIAGNOSIS — M25511 Pain in right shoulder: Secondary | ICD-10-CM

## 2016-12-26 DIAGNOSIS — G8929 Other chronic pain: Secondary | ICD-10-CM

## 2016-12-26 NOTE — Progress Notes (Signed)
Office Visit Note   Patient: Jennifer Coleman           Date of Birth: 1951/07/30           MRN: [id number] Visit Date: 12/26/2016              Requested by: Mikey Kirschner, MD [address] [place], [place] [address] PCP: Mickie Hillier, MD   Assessment & Plan: Visit Diagnoses:  1. Chronic right shoulder pain   3 months status post repeat right shoulder arthroscopy and exploration. We inserted bone anchor at from the bone causing quite a bit of pain. Now back to work and working with a home exercise program to strengthen right shoulder. Jennifer Coleman is pleased with her present course.  Plan: We will see in 2 months and encourage home exercise program. Might want to consider subacromial cortisone injection at some point in the future   Follow-Up Instructions: Return in about 2 months (around 02/23/2017).   Orders:  No orders of the defined types were placed in this encounter.  No orders of the defined types were placed in this encounter.     Procedures: No procedures performed   Clinical Data: No additional findings.   Subjective: Chief Complaint  Patient presents with  . Right Shoulder - Routine Post Op  Jennifer Coleman is 3 months status post reexploration of her right shoulder. A bone anchor had extruded from the bone causing quite a bit of subacromial irritation. This was removed. The majority of the rotator cuff had healed a small rent was sutured. She is actually doing quite well at this point she still has trouble sleeping on that side and has not quite regained all of her motion but certainly better than she was prior to this  HPI  Review of Systems   Objective: Vital Signs: BP 125/73   Pulse 72   Ht '5\' 6"'$  (1.676 m)   Wt 220 lb (99.8 kg)   BMI 35.51 kg/m   Physical Exam  Ortho Exam full overhead motion and abduction passively. Actively able to abduct without any trouble. She flexes to about 90 and then has some discomfort but with the use of her left hand can place  her right arm fully overhead. Neurovascular exam is intact. Some sub-acromio crepitation with motion but negative impingement and negative empty can testing.  Specialty Comments:  No specialty comments available.  Imaging: No results found.   PMFS History: Patient Active Problem List   Diagnosis Date Noted  . Nontraumatic incomplete tear of right rotator cuff 09/25/2016  . Foreign body of shoulder right  09/25/2016  . 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 Shoulder joint painful on movement, right 08/29/2016  . Epigastric pain 08/11/2015  . Chest pain, rule out acute myocardial infarction 08/11/2015  . Renal insufficiency 08/11/2015  . Venous stasis 04/13/2015  . Depression 04/13/2015  . Essential hypertension 09/12/2014  . PVCs (premature ventricular contractions) 09/07/2014  . Precordial pain 09/07/2014   Past Medical History:  Diagnosis Date  . Anxiety   . Arthritis   . Family history of anesthesia complication 25 yrs ago   Sister allergic to sccinocholine - unable to come off ventilator  . Hypertension   . Pneumonia   . PONV (postoperative nausea and vomiting)   . Premature ventricular contractions   . Shingles   . Varicose veins   . Varicose veins of left lower extremity     Family History  Problem Relation Age of  Onset  . Heart disease Sister   . Heart disease Brother     24's  . Diabetes Mellitus II Sister   . Multiple myeloma Mother   . Hypertension Sister   . Hypertension Father   . Heart attack Father     Past Surgical History:  Procedure Laterality Date  . CARPAL TUNNEL RELEASE    . CERVICAL DISC SURGERY  1992   Fusion  . COLONOSCOPY N/A 06/07/2016   Procedure: COLONOSCOPY;  Surgeon: Rogene Houston, MD;  [facility];  Service: Endoscopy;  Laterality: N/A;  1:00  . KNEE ARTHROSCOPY  09/16/2012   Procedure: ARTHROSCOPY KNEE;  Surgeon: [name], MD;  Location: [place];  Service: Orthopedics;  Laterality: Right;  Right  Knee Arthroscopy  . SALPINGOOPHORECTOMY Left 1985  . SHOULDER ARTHROSCOPY WITH OPEN ROTATOR CUFF REPAIR AND DISTAL CLAVICLE ACROMINECTOMY Right 05/03/2016   Procedure: RIGHT SHOULDER ARTHROSCOPY WITH MINI-OPEN ROTATOR CUFF REPAIR,DISTAL CLAVICLE RESECTION, SUBACROMIAL DECOMPRESSION.;  Surgeon: [name], MD;  Location: [place];  Service: Orthopedics;  Laterality: Right;  . SHOULDER ARTHROSCOPY WITH ROTATOR CUFF REPAIR Right 09/25/2016   Procedure: Right Shoulder Manipulation, Arthroscopic Debridement of Joint, Removal of Loose Body and Re-Repair of Rotator Cuff;  Surgeon: [name], MD;  Location: [place];  Service: Orthopedics;  Laterality: Right;   Social History   Occupational History  . Not on file.   Social History Main Topics  . Smoking status: Former Smoker    Types: Cigarettes  . Smokeless tobacco: Former Systems developer    Quit date: 01/12/1997  . Alcohol use No  . Drug use: No  . Sexual activity: Yes

## 2016-12-26 NOTE — Progress Notes (Deleted)
Office Visit Note   Patient: Jennifer Coleman           Date of Birth: 22-Sep-1951           MRN: [id number] Visit Date: 12/26/2016              Requested by: Mikey Kirschner, MD [address] [place], [place] [address] PCP: Mickie Hillier, MD   Assessment & Plan: Visit Diagnoses: No diagnosis found.  Plan: ***  Follow-Up Instructions: No Follow-up on file.   Orders:  No orders of the defined types were placed in this encounter.  No orders of the defined types were placed in this encounter.     Procedures: No procedures performed   Clinical Data: No additional findings.   Subjective: No chief complaint on file.   3 months status post  Nontraumatic incomplete tear of right rotator cuff  Pt relates she has gained some weight but is working on it         Review of Systems   Objective: Vital Signs: Ht '5\' 6"'$  (1.676 m)   Wt 220 lb (99.8 kg)   BMI 35.51 kg/m   Physical Exam  Ortho Exam  Specialty Comments:  No specialty comments available.  Imaging: No results found.   PMFS History: Patient Active Problem List   Diagnosis Date Noted  . Nontraumatic incomplete tear of right rotator cuff 09/25/2016  . Foreign body of shoulder right  09/25/2016  . 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 Shoulder joint painful on movement, right 08/29/2016  . Epigastric pain 08/11/2015  . Chest pain, rule out acute myocardial infarction 08/11/2015  . Renal insufficiency 08/11/2015  . Venous stasis 04/13/2015  . Depression 04/13/2015  . Essential hypertension 09/12/2014  . PVCs (premature ventricular contractions) 09/07/2014  . Precordial pain 09/07/2014   Past Medical History:  Diagnosis Date  . Anxiety   . Arthritis   . Family history of anesthesia complication 25 yrs ago   Sister allergic to sccinocholine - unable to come off ventilator  . Hypertension   . Pneumonia   . PONV (postoperative nausea and vomiting)   . Premature ventricular  contractions   . Shingles   . Varicose veins   . Varicose veins of left lower extremity     Family History  Problem Relation Age of Onset  . Heart disease Sister   . Heart disease Brother     32's  . Diabetes Mellitus II Sister   . Multiple myeloma Mother   . Hypertension Sister   . Hypertension Father   . Heart attack Father     Past Surgical History:  Procedure Laterality Date  . CARPAL TUNNEL RELEASE    . CERVICAL DISC SURGERY  1992   Fusion  . COLONOSCOPY N/A 06/07/2016   Procedure: COLONOSCOPY;  Surgeon: Rogene Houston, MD;  [facility];  Service: Endoscopy;  Laterality: N/A;  1:00  . KNEE ARTHROSCOPY  09/16/2012   Procedure: ARTHROSCOPY KNEE;  Surgeon: [name], MD;  Location: [facility];  Service: Orthopedics;  Laterality: Right;  Right Knee Arthroscopy  . SALPINGOOPHORECTOMY Left 1985  . SHOULDER ARTHROSCOPY WITH OPEN ROTATOR CUFF REPAIR AND DISTAL CLAVICLE ACROMINECTOMY Right 05/03/2016   Procedure: RIGHT SHOULDER ARTHROSCOPY WITH MINI-OPEN ROTATOR CUFF REPAIR,DISTAL CLAVICLE RESECTION, SUBACROMIAL DECOMPRESSION.;  Surgeon: [name], MD;  Location: [facility];  Service: Orthopedics;  Laterality: Right;  . SHOULDER ARTHROSCOPY WITH ROTATOR CUFF REPAIR Right 09/25/2016  Procedure: Right Shoulder Manipulation, Arthroscopic Debridement of Joint, Removal of Loose Body and Re-Repair of Rotator Cuff;  Surgeon: [name], MD;  Location: [facility];  Service: Orthopedics;  Laterality: Right;   Social History   Occupational History  . Not on file.   Social History Main Topics  . Smoking status: Former Smoker    Types: Cigarettes  . Smokeless tobacco: Former Systems developer    Quit date: 01/12/1997  . Alcohol use No  . Drug use: No  . Sexual activity: Yes

## 2017-02-07 ENCOUNTER — Other Ambulatory Visit: Payer: Self-pay | Admitting: Family Medicine

## 2017-02-07 MED FILL — HYDROCHLOROTHIAZIDE 25 MG T: 25 | 30 days supply | Qty: 30 | Fill #0

## 2017-02-07 MED FILL — FLUTICASONE PROP 50 MCG SPR: 50 | 30 days supply | Qty: 16 | Fill #1

## 2017-02-07 MED FILL — ESCITALOPRAM 20 MG TABLET: 20 | 30 days supply | Qty: 30 | Fill #0

## 2017-02-07 MED FILL — ESTRADIOL-NORETH 0.5-0.1 MG: 0.5-0.1 | 28 days supply | Qty: 28 | Fill #2

## 2017-02-07 MED FILL — ENALAPRIL MALEATE 20 MG TAB: 20 | 30 days supply | Qty: 60 | Fill #0

## 2017-02-13 ENCOUNTER — Telehealth (INDEPENDENT_AMBULATORY_CARE_PROVIDER_SITE_OTHER): Payer: Self-pay | Admitting: Orthopaedic Surgery

## 2017-02-13 NOTE — Telephone Encounter (Signed)
Patient called,left msg on my voice mail checking status of records request from [employer].  I called her back and left her msg advising that request was received 4/9 and the turn around time in 7-10 business days.I said that I do have request but her insur co has her DOB wrong on the authorizationand that I cannot accept it, I told told her I faxed it back advising about the DOB and that once I received corrected authorization that I would process the request.

## 2017-02-14 ENCOUNTER — Telehealth (INDEPENDENT_AMBULATORY_CARE_PROVIDER_SITE_OTHER): Payer: Self-pay | Admitting: Orthopaedic Surgery

## 2017-02-14 NOTE — Telephone Encounter (Signed)
I TALKED TO PATIENT AND SHE  FAXED LETTER SIGNED & DATED REQUESTING RECORDS BE FAXED TO [employer] AS THE REQUEST RECEIVED FROM [employer] HAD HER DOB WRONG.  RECORDS FAXED.

## 2017-04-04 ENCOUNTER — Other Ambulatory Visit: Payer: Self-pay | Admitting: Family Medicine

## 2017-04-04 MED FILL — ESTRADIOL-NORETH 0.5-0.1 MG: 0.5-0.1 | 28 days supply | Qty: 28 | Fill #3

## 2017-04-04 NOTE — Telephone Encounter (Signed)
Last seen 11/15/2016

## 2017-04-05 MED FILL — ENALAPRIL MALEATE 20 MG TAB: 20 | 30 days supply | Qty: 60 | Fill #0

## 2017-04-05 MED FILL — ESCITALOPRAM 20 MG TABLET: 20 | 30 days supply | Qty: 30 | Fill #0

## 2017-04-05 MED FILL — HYDROCHLOROTHIAZIDE 25 MG T: 25 | 30 days supply | Qty: 30 | Fill #0

## 2017-04-05 NOTE — Telephone Encounter (Signed)
Call pt, sched appt, thirty days worth of meds only after sched appt for six mo ck up (maybe already has?)

## 2017-04-24 ENCOUNTER — Other Ambulatory Visit (HOSPITAL_COMMUNITY): Payer: Self-pay | Admitting: Obstetrics and Gynecology

## 2017-04-24 DIAGNOSIS — Z1231 Encounter for screening mammogram for malignant neoplasm of breast: Secondary | ICD-10-CM

## 2017-05-03 ENCOUNTER — Ambulatory Visit (HOSPITAL_COMMUNITY)
Admission: RE | Admit: 2017-05-03 | Discharge: 2017-05-03 | Disposition: A | Discharge status: Home / Self Care | Payer: 59 | Source: Ambulatory Visit | Attending: Obstetrics and Gynecology | Admitting: Obstetrics and Gynecology

## 2017-05-03 DIAGNOSIS — Z1231 Encounter for screening mammogram for malignant neoplasm of breast: Secondary | ICD-10-CM | POA: Insufficient documentation

## 2017-05-13 DIAGNOSIS — S335XXA Sprain of ligaments of lumbar spine, initial encounter: Secondary | ICD-10-CM | POA: Diagnosis not present

## 2017-05-15 ENCOUNTER — Ambulatory Visit (INDEPENDENT_AMBULATORY_CARE_PROVIDER_SITE_OTHER): Payer: 59

## 2017-05-15 ENCOUNTER — Encounter (INDEPENDENT_AMBULATORY_CARE_PROVIDER_SITE_OTHER): Payer: Self-pay | Admitting: Orthopaedic Surgery

## 2017-05-15 ENCOUNTER — Ambulatory Visit (INDEPENDENT_AMBULATORY_CARE_PROVIDER_SITE_OTHER): Payer: 59 | Admitting: Orthopaedic Surgery

## 2017-05-15 VITALS — Ht 66.0 in | Wt 220.0 lb

## 2017-05-15 DIAGNOSIS — M79672 Pain in left foot: Secondary | ICD-10-CM

## 2017-05-15 NOTE — Progress Notes (Signed)
Office Visit Note   Patient: Jennifer Coleman           Date of Birth: 1951/01/09           MRN: [id number] Visit Date: 05/15/2017              Requested by: Mikey Kirschner, [address] [place], [place] [address] PCP: Mikey Kirschner, MD   Assessment & Plan: Visit Diagnoses:  1. Pain in left foot   2. Left foot pain    Inflammation of second metatarsal phalangeal joint left foot  Plan: Samples of [place]. Comfortable shoes. Long discussion regarding diagnosis of different treatment options including cortisone injection. Plan to see her back in the next 3-4 weeks if no improvement  Follow-Up Instructions: No Follow-up on file.   Orders:  Orders Placed This Encounter  Procedures  . XR Foot Complete Left   No orders of the defined types were placed in this encounter.     Procedures: No procedures performed   Clinical Data: No additional findings.   Subjective: Chief Complaint  Patient presents with  . Left Foot - Pain    Jennifer Coleman is a 66 y o that presents with a "knot" in the area between the great and 2nd toe x 2 months. She relates it hurts to walk on it   [place] visits the  office for evaluation of left foot pain. She noted insidious onset of pain over the last several months associated with what she felt  was a "knot" on the plantar aspect of her foot near the second metatarsal head. No injury or trauma. No obvious swelling of her toe. Pain is worse with weightbearing  HPI  Review of Systems   Objective: Vital Signs: Ht '5\' 6"'$  (1.676 m)   Wt 220 lb (99.8 kg)   BMI 35.51 kg/m   Physical Exam  Ortho Exam left foot without any tenderness in the plantar aspect of her foot. No heel pain. Skin intact. Neurovascular exam intact. Localized tenderness over the second metatarsal phalangeal joint. Early clawing of the second toe. Nails are intact. No the inner metatarsal head region  No specialty comments available.  Imaging: Xr Foot Complete  Left  Result Date: 05/15/2017 Films of the left foot were obtained in 3 projections. No evidence of fracture dislocation along the metatarsal phalangeal joints. Pain is localized over the second metatarsal phalangeal joint without obvious roentgenographic abnormality    PMFS History: Patient Active Problem List   Diagnosis Date Noted  . Nontraumatic incomplete tear of right rotator cuff 09/25/2016  . Foreign body of shoulder right  09/25/2016  . 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 Shoulder joint painful on movement, right 08/29/2016  . Epigastric pain 08/11/2015  . Chest pain, rule out acute myocardial infarction 08/11/2015  . Renal insufficiency 08/11/2015  . Venous stasis 04/13/2015  . Depression 04/13/2015  . Essential hypertension 09/12/2014  . PVCs (premature ventricular contractions) 09/07/2014  . Precordial pain 09/07/2014   Past Medical History:  Diagnosis Date  . Anxiety   . Arthritis   . Family history of anesthesia complication 25 yrs ago   Sister allergic to sccinocholine - unable to come off ventilator  . Hypertension   . Pneumonia   . PONV (postoperative nausea and vomiting)   . Premature ventricular contractions   . Shingles   . Varicose veins   . Varicose veins of left lower extremity     Family History  Problem Relation Age of Onset  . Heart disease Sister   . Heart disease Brother        11's  . Diabetes Mellitus II Sister   . Multiple myeloma Mother   . Hypertension Sister   . Hypertension Father   . Heart attack Father     Past Surgical History:  Procedure Laterality Date  . CARPAL TUNNEL RELEASE    . CERVICAL DISC SURGERY  1992   Fusion  . COLONOSCOPY N/A 06/07/2016   Procedure: COLONOSCOPY;  Surgeon: Rogene Houston, MD;  [facility];  Service: Endoscopy;  Laterality: N/A;  1:00  . KNEE ARTHROSCOPY  09/16/2012   Procedure: ARTHROSCOPY KNEE;  Surgeon: [name], MD;  Location: [facility];  Service: Orthopedics;   Laterality: Right;  Right Knee Arthroscopy  . SALPINGOOPHORECTOMY Left 1985  . SHOULDER ARTHROSCOPY WITH OPEN ROTATOR CUFF REPAIR AND DISTAL CLAVICLE ACROMINECTOMY Right 05/03/2016   Procedure: RIGHT SHOULDER ARTHROSCOPY WITH MINI-OPEN ROTATOR CUFF REPAIR,DISTAL CLAVICLE RESECTION, SUBACROMIAL DECOMPRESSION.;  Surgeon: [name], MD;  Location: [facility];  Service: Orthopedics;  Laterality: Right;  . SHOULDER ARTHROSCOPY WITH ROTATOR CUFF REPAIR Right 09/25/2016   Procedure: Right Shoulder Manipulation, Arthroscopic Debridement of Joint, Removal of Loose Body and Re-Repair of Rotator Cuff;  Surgeon: [name], MD;  Location: [facility];  Service: Orthopedics;  Laterality: Right;   Social History   Occupational History  . Not on file.   Social History Main Topics  . Smoking status: Former Smoker    Types: Cigarettes  . Smokeless tobacco: Former Systems developer    Quit date: 01/12/1997  . Alcohol use No  . Drug use: No  . Sexual activity: Yes     [name], MD   Note - This record has been created using [employer].  Chart creation errors have been sought, but may not always  have been located. Such creation errors do not reflect on  the standard of medical care.

## 2017-05-17 DIAGNOSIS — S335XXA Sprain of ligaments of lumbar spine, initial encounter: Secondary | ICD-10-CM | POA: Diagnosis not present

## 2017-05-22 DIAGNOSIS — S335XXA Sprain of ligaments of lumbar spine, initial encounter: Secondary | ICD-10-CM | POA: Diagnosis not present

## 2017-05-30 ENCOUNTER — Other Ambulatory Visit: Payer: Self-pay | Admitting: Family Medicine

## 2017-05-30 MED FILL — ESTRADIOL-NORETH 0.5-0.1 MG: 0.5-0.1 | 28 days supply | Qty: 28 | Fill #4

## 2017-05-31 MED FILL — ESCITALOPRAM 20 MG TABLET: 20 | 30 days supply | Qty: 30 | Fill #0

## 2017-05-31 MED FILL — ENALAPRIL MALEATE 20 MG TAB: 20 | 30 days supply | Qty: 60 | Fill #0

## 2017-07-26 ENCOUNTER — Other Ambulatory Visit: Payer: Self-pay | Admitting: Nurse Practitioner

## 2017-07-26 MED FILL — ESCITALOPRAM 20 MG TABLET: 20 | 30 days supply | Qty: 30 | Fill #0

## 2017-09-09 MED FILL — ESCITALOPRAM 20 MG TABLET: 20 | 30 days supply | Qty: 30 | Fill #1

## 2017-09-11 MED FILL — ESTRADIOL-NORETH 0.5-0.1 MG: 0.5-0.1 | 28 days supply | Qty: 28 | Fill #0

## 2017-09-18 IMAGING — DX DG KNEE COMPLETE 4+V*L*
4 series · 4 of 4 positions shown · non-contrast
Comparison: None.

CLINICAL DATA: Fall striking left knee and proximal lower leg.
Hematoma and bruising anteriorly.

EXAM:
LEFT KNEE - COMPLETE 4+ VIEW

[t knee ap left]
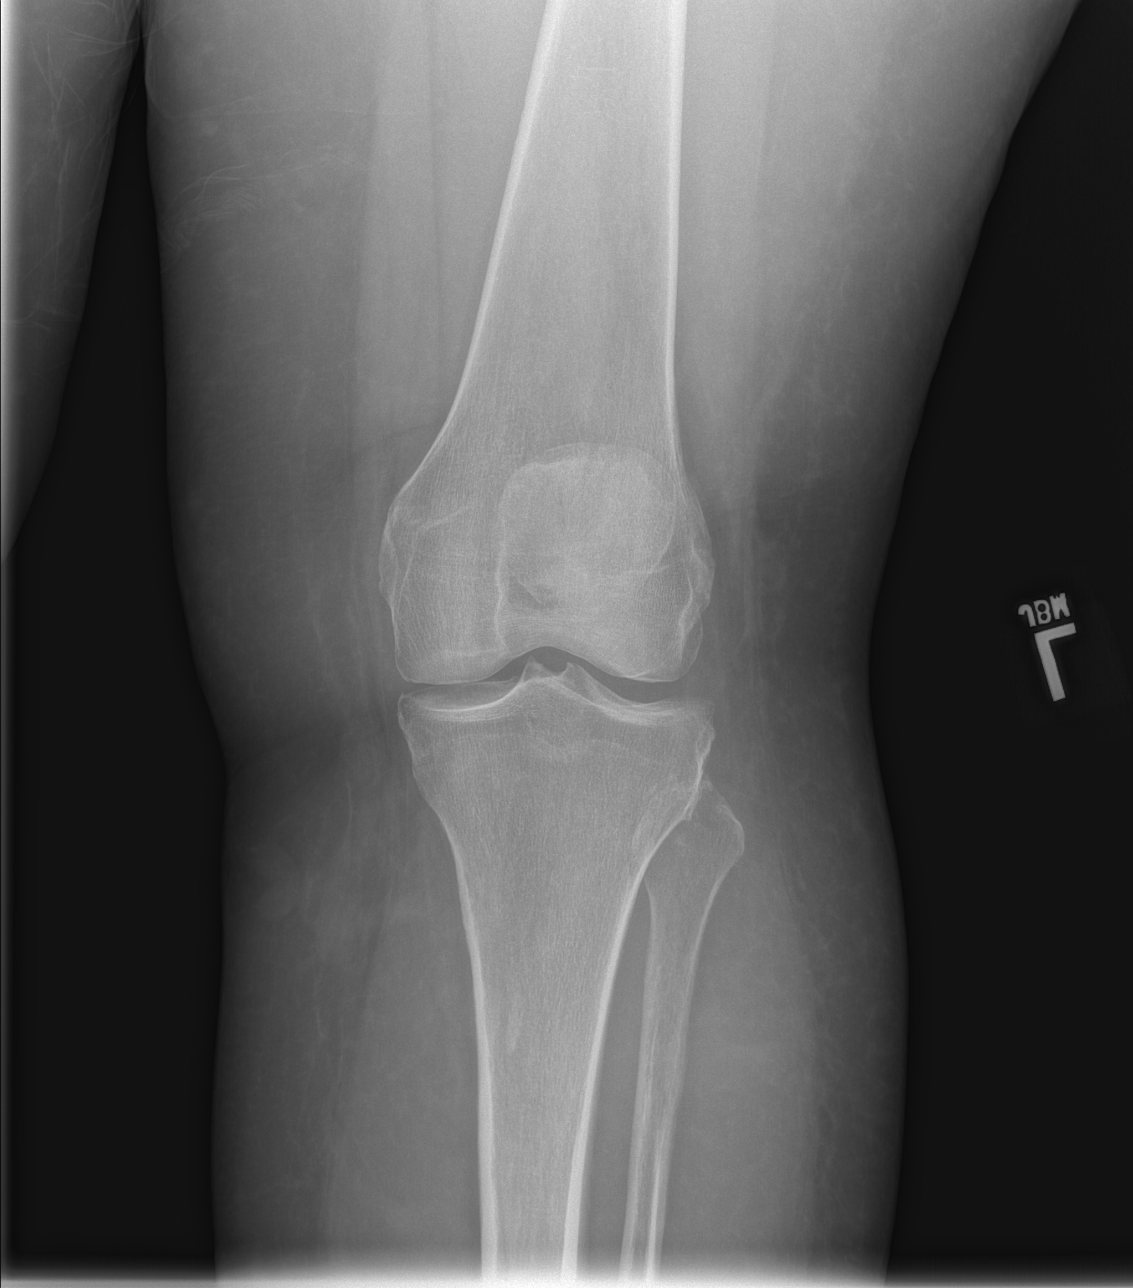

[t knee obl left (1 of 2)]
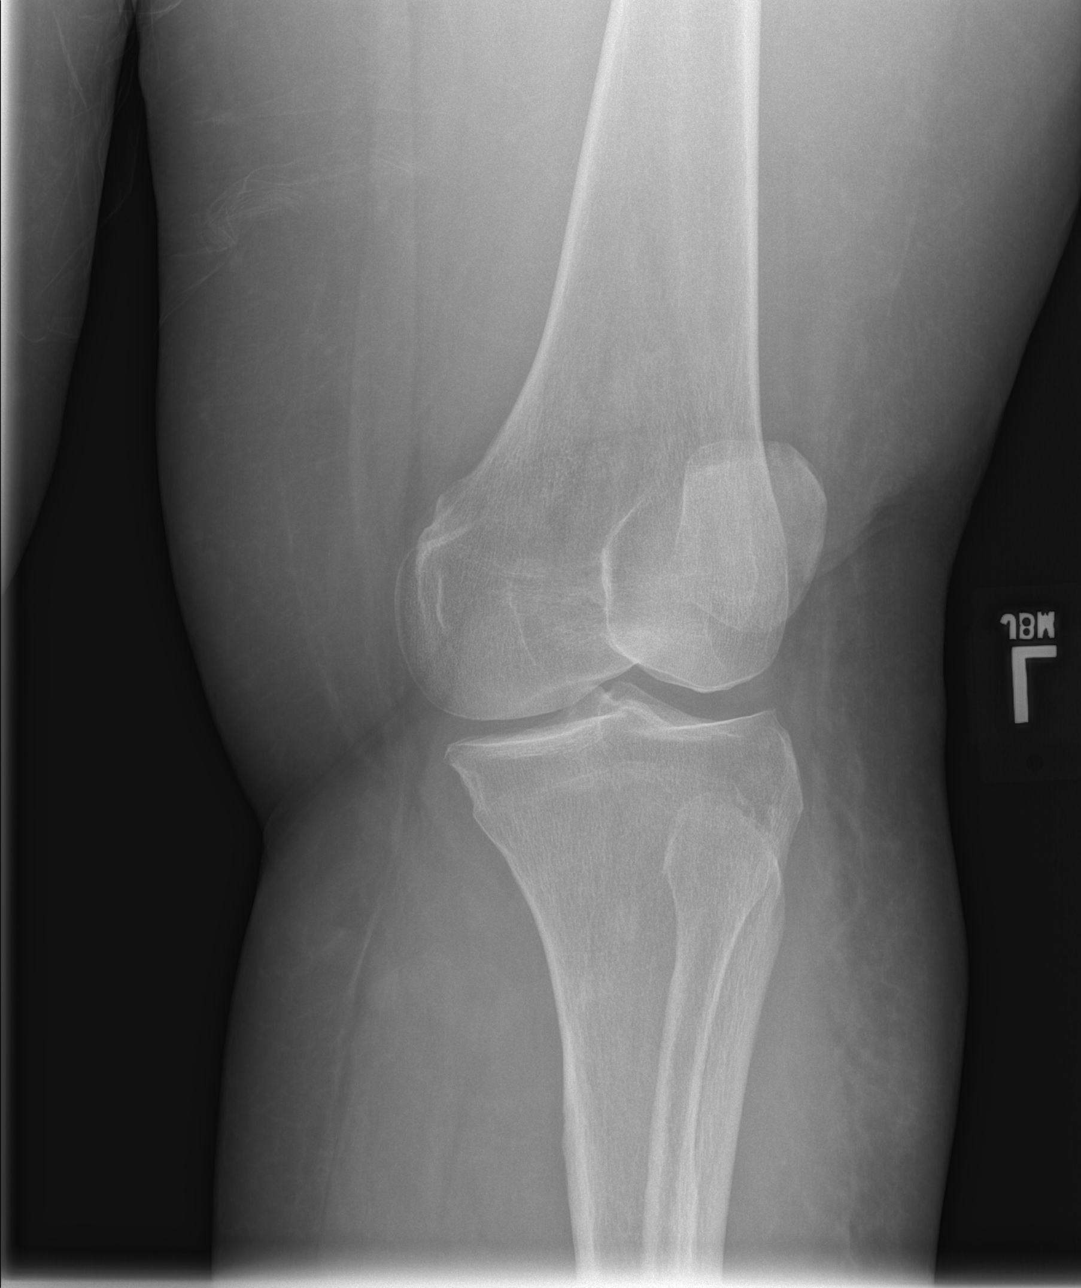

[t knee obl left (2 of 2)]
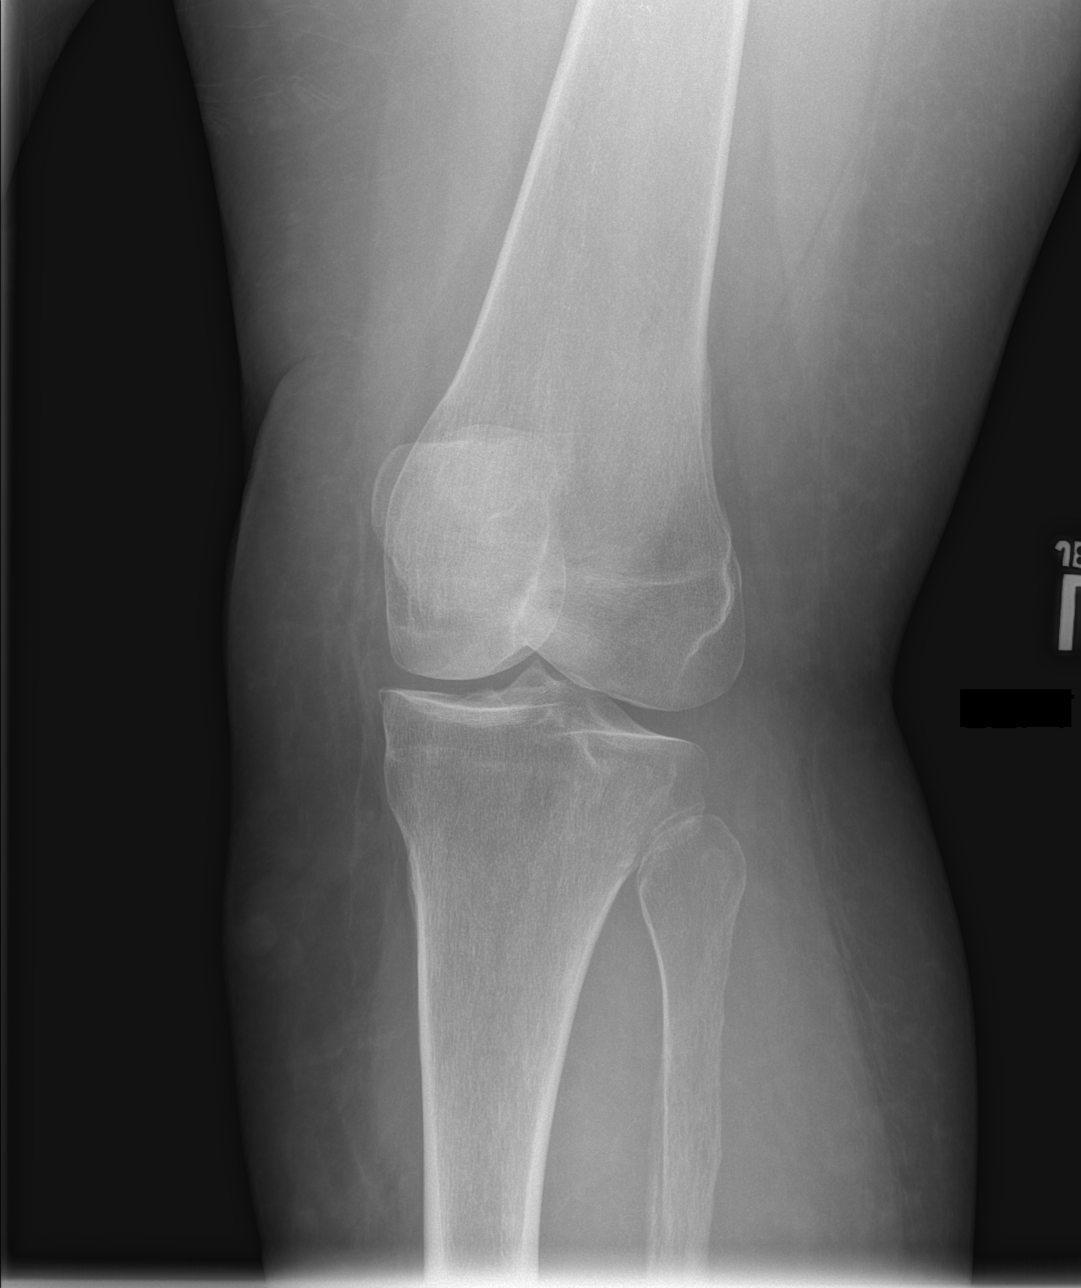

[t knee lat left]
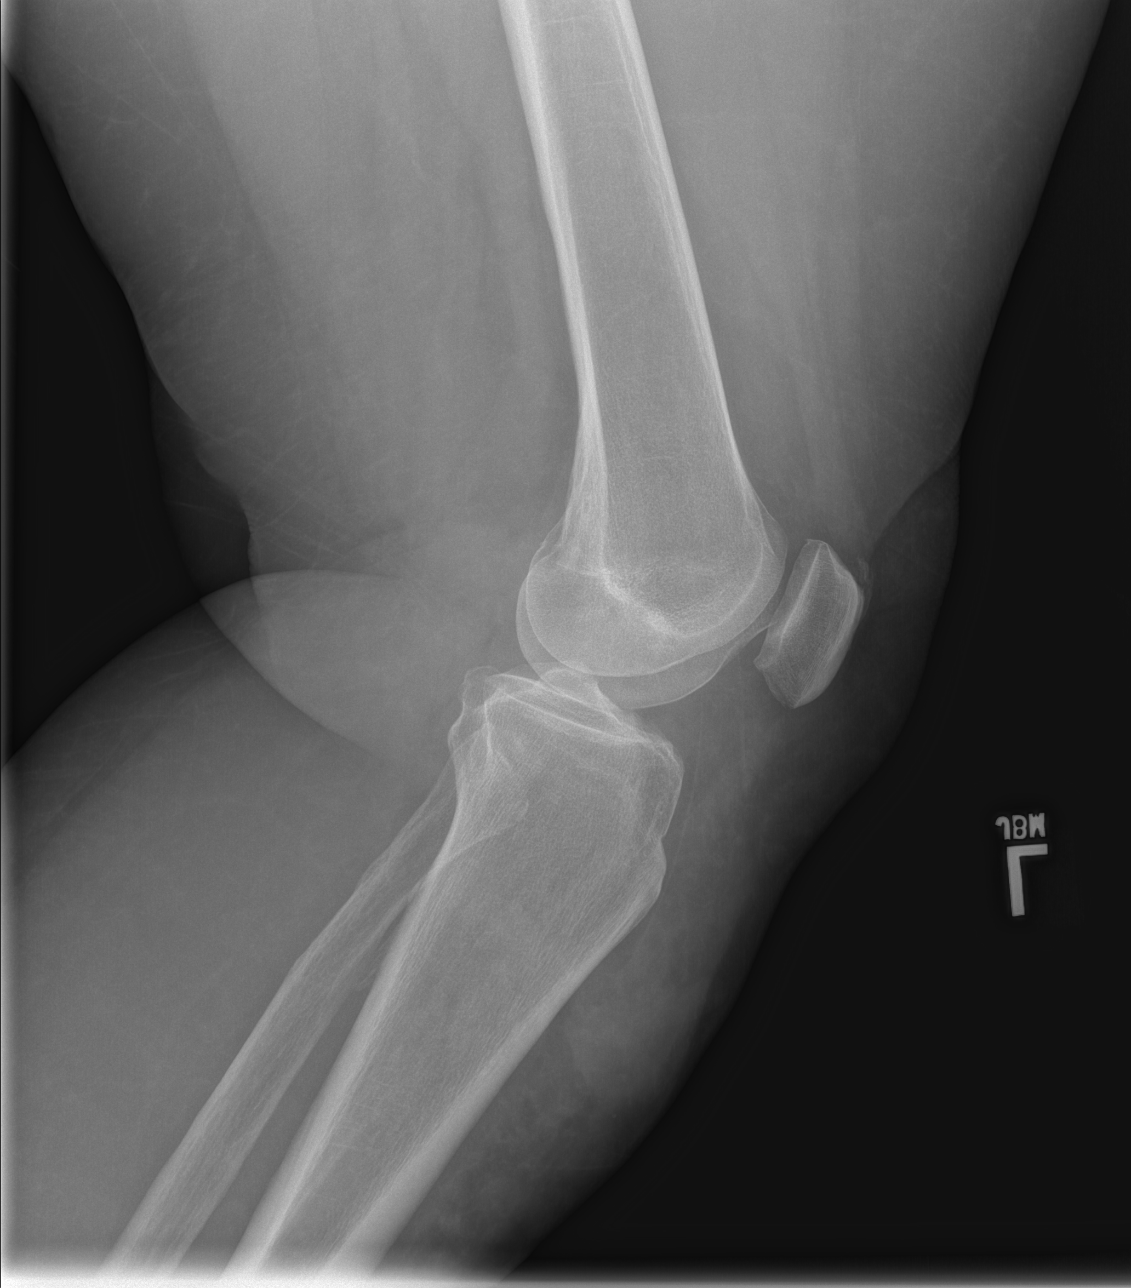

[4 of 4 positions shown; findings below may reference images not displayed]

FINDINGS: No acute fracture or dislocation. Mild tricompartmental
osteoarthritis with tricompartmental peripheral spurring. There is
spurring of tibial spines. No significant joint space narrowing. No
joint effusion. Focal soft tissue prominence over the anterior lower
leg consistent with hematoma.
IMPRESSION: 1. No acute fracture or subluxation of the left knee. Mild
osteoarthritis.
2. Focal soft tissue prominence over the proximal anterior lower leg
consistent with hematoma in the setting of injury.

## 2017-09-18 IMAGING — DX DG TIBIA/FIBULA 2V*L*
3 series · 3 of 3 positions shown · non-contrast
Comparison: None.

CLINICAL DATA: Fall striking left knee and proximal lower leg.
Hematoma and bruising anteriorly.

EXAM:
LEFT TIBIA AND FIBULA - 2 VIEW

[x tib-fib ap left (1 of 2)]
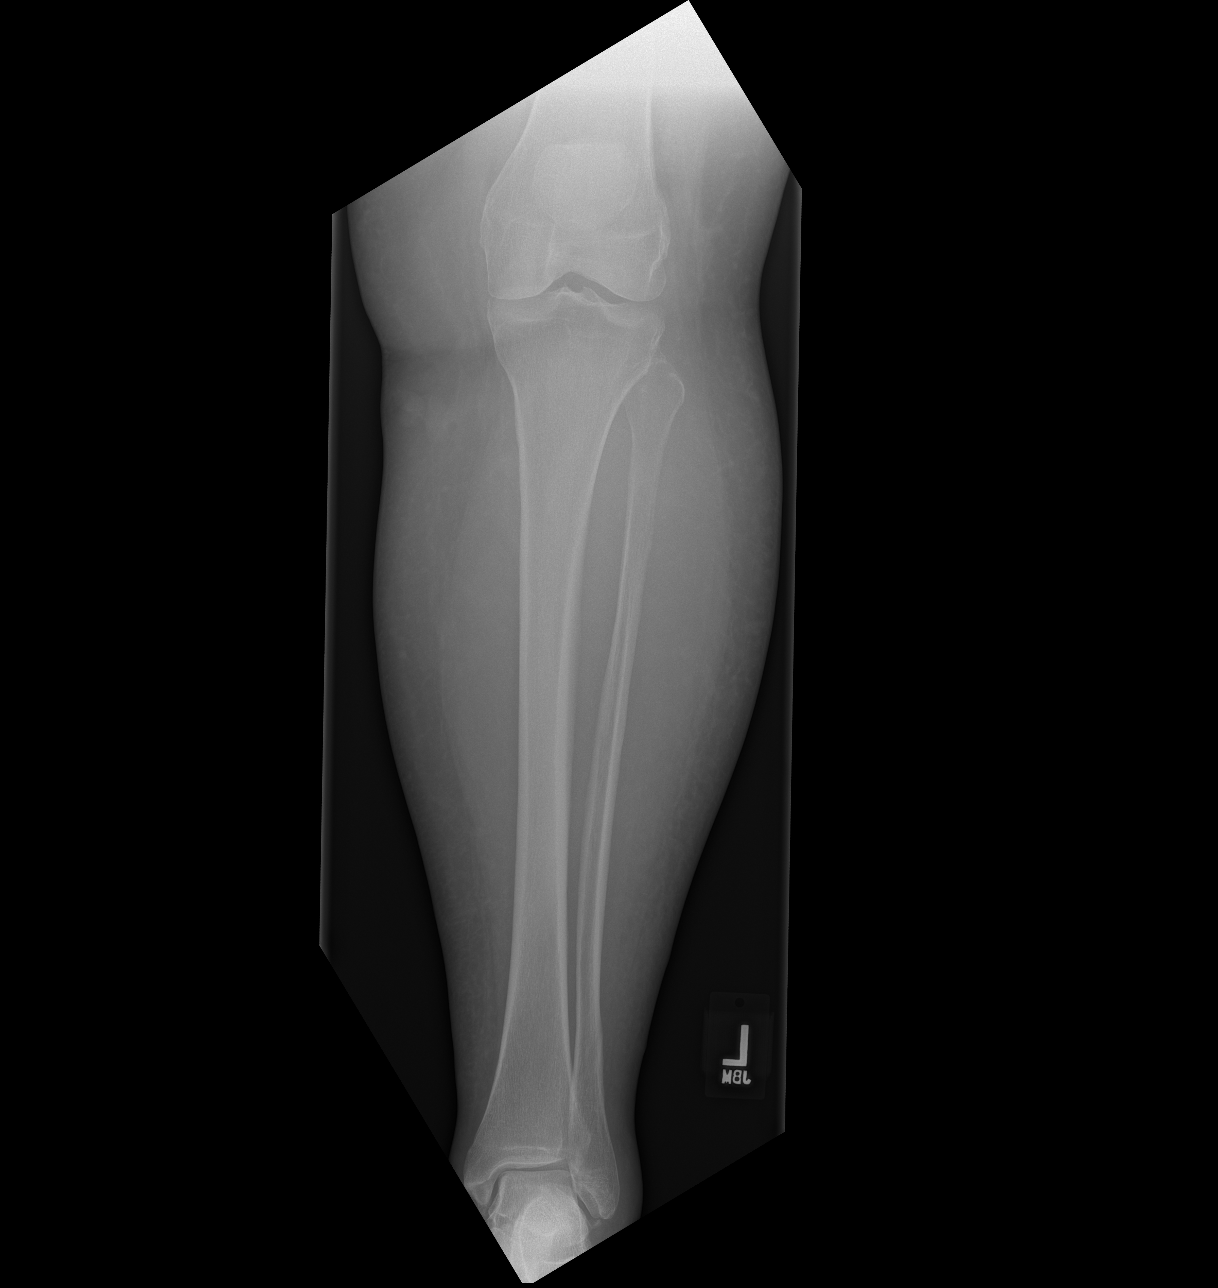

[x tib-fib ap left (2 of 2)]
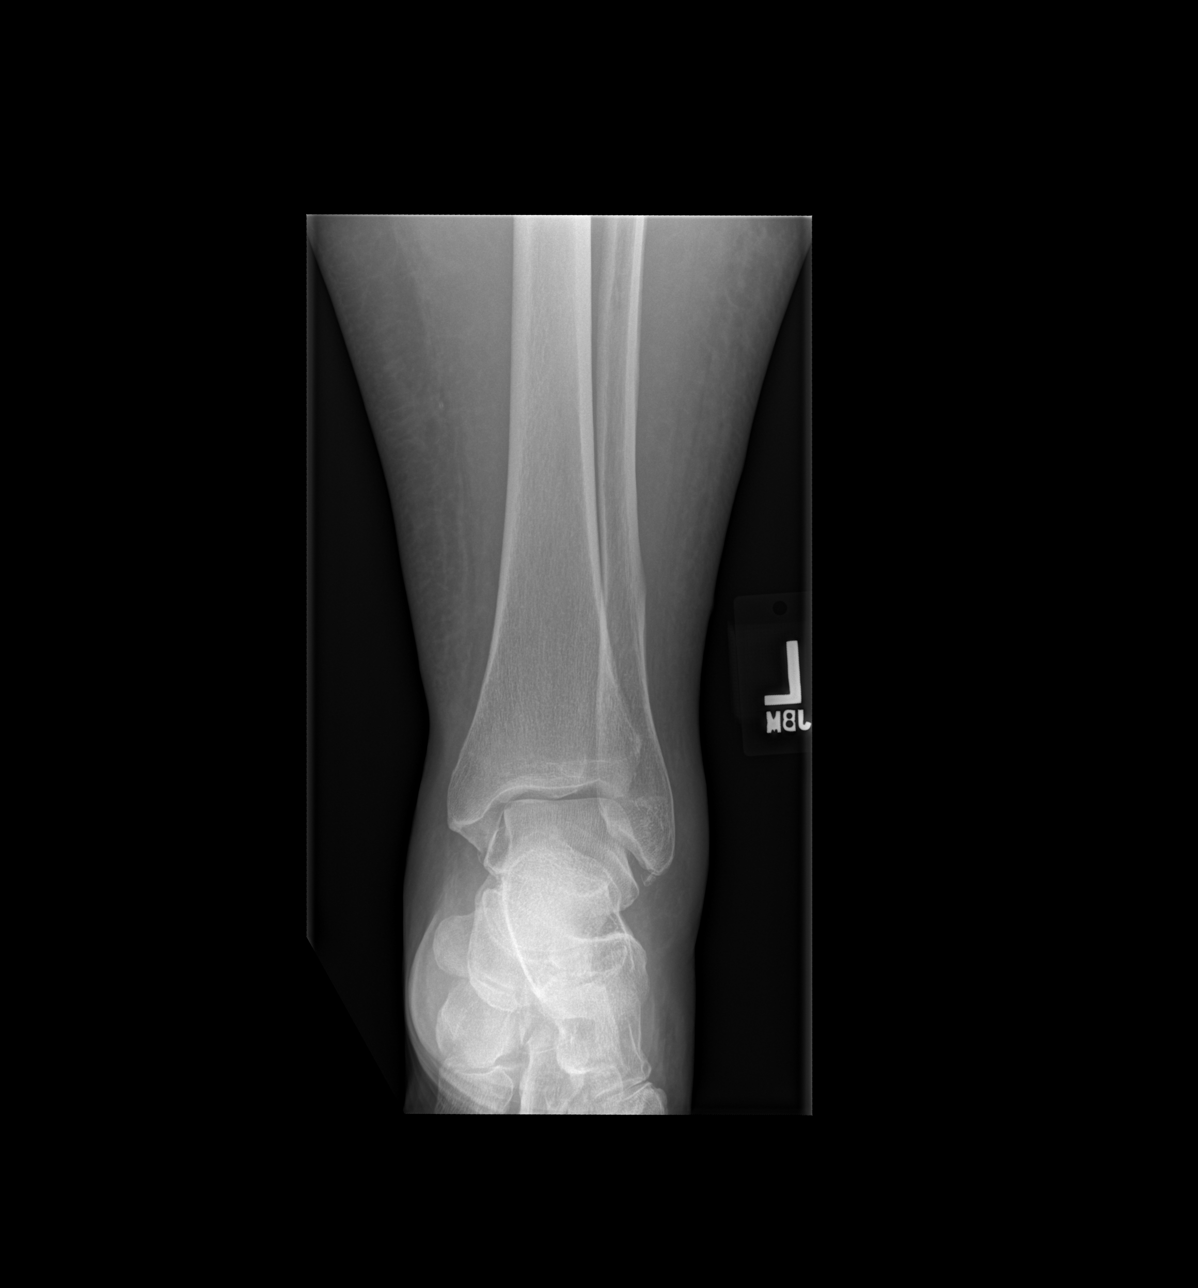

[t tib-fib lat left]
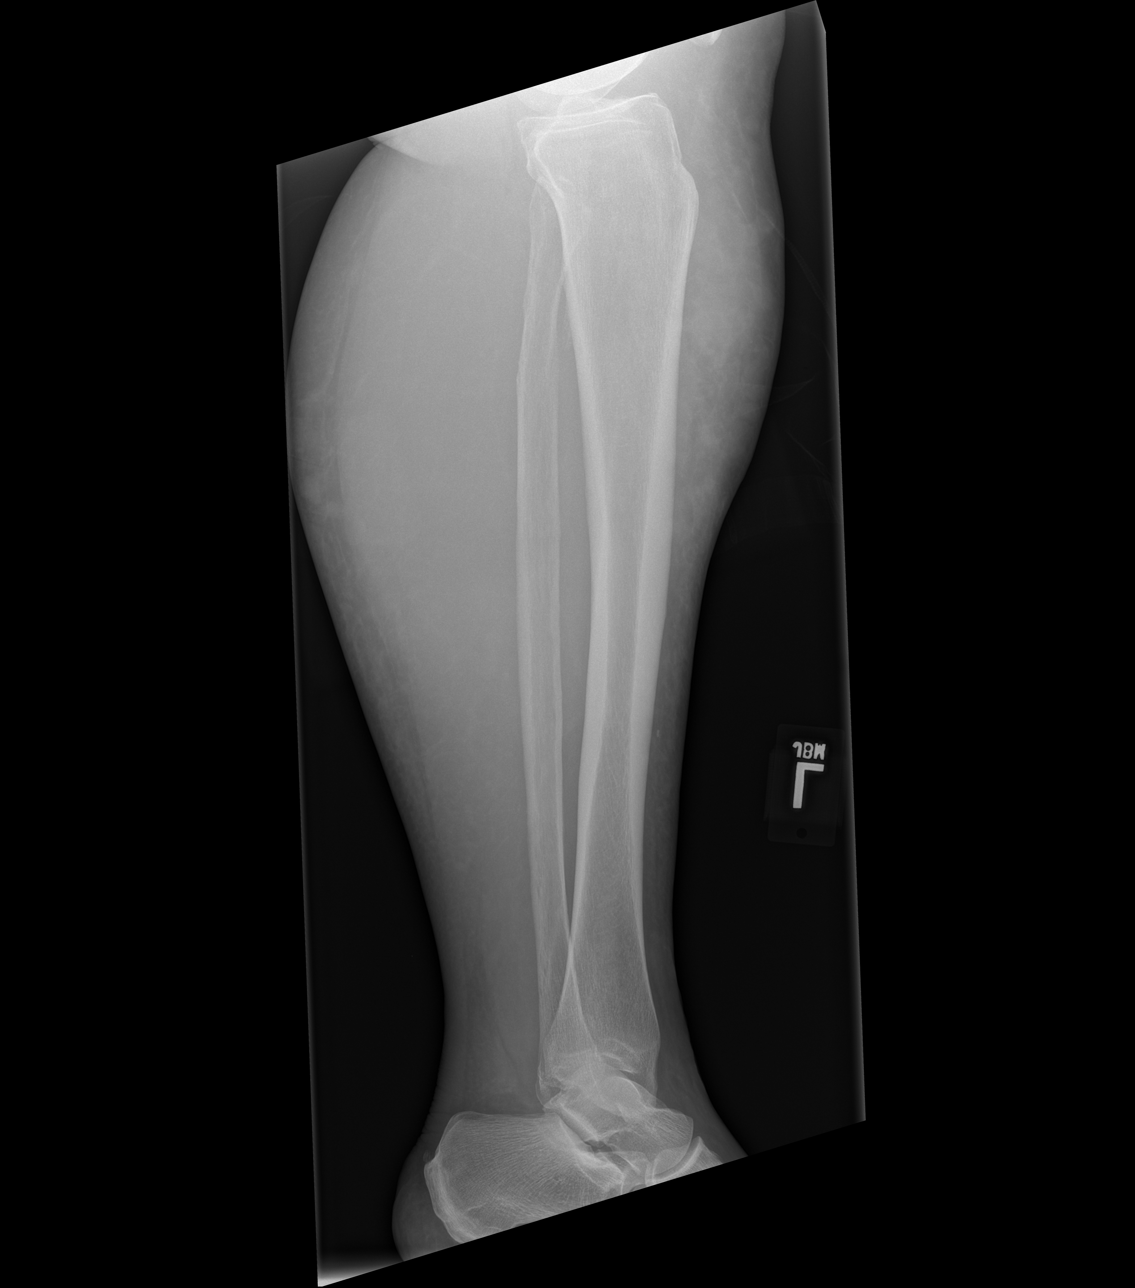

[3 of 3 positions shown; findings below may reference images not displayed]

FINDINGS: No acute fracture. Cortical margins of the tibia and fibula are
intact. Mild degenerative change at the knee and ankle. Soft tissue
edema with focal soft tissue prominence of the anterior upper lower
like consistent with hematoma. No radiopaque foreign body.
IMPRESSION: 1. No acute fracture of the left lower leg.
2. Focal soft tissue prominence over the anterior lower leg
consistent with hematoma.

## 2017-11-05 ENCOUNTER — Telehealth: Payer: Self-pay | Admitting: *Deleted

## 2017-11-05 ENCOUNTER — Encounter: Payer: Self-pay | Admitting: Nurse Practitioner

## 2017-11-05 ENCOUNTER — Ambulatory Visit (INDEPENDENT_AMBULATORY_CARE_PROVIDER_SITE_OTHER): Payer: 59 | Admitting: Nurse Practitioner

## 2017-11-05 VITALS — BP 144/86 | Temp 98.3°F | Ht 66.0 in | Wt 245.0 lb

## 2017-11-05 DIAGNOSIS — Z13220 Encounter for screening for lipoid disorders: Secondary | ICD-10-CM

## 2017-11-05 DIAGNOSIS — R5383 Other fatigue: Secondary | ICD-10-CM | POA: Diagnosis not present

## 2017-11-05 DIAGNOSIS — J0100 Acute maxillary sinusitis, unspecified: Secondary | ICD-10-CM

## 2017-11-05 DIAGNOSIS — I10 Essential (primary) hypertension: Secondary | ICD-10-CM | POA: Diagnosis not present

## 2017-11-05 DIAGNOSIS — I878 Other specified disorders of veins: Secondary | ICD-10-CM | POA: Diagnosis not present

## 2017-11-05 DIAGNOSIS — Z79899 Other long term (current) drug therapy: Secondary | ICD-10-CM | POA: Diagnosis not present

## 2017-11-05 DIAGNOSIS — F418 Other specified anxiety disorders: Secondary | ICD-10-CM | POA: Diagnosis not present

## 2017-11-05 MED ORDER — CEFDINIR 300 MG PO CAPS: 300.0000 mg | ORAL_CAPSULE | Freq: Two times a day (BID) | ORAL | 0 refills | Status: DC

## 2017-11-05 MED ORDER — ENALAPRIL MALEATE 20 MG PO TABS: 20.0000 mg | ORAL_TABLET | Freq: Two times a day (BID) | ORAL | 1 refills | Status: DC

## 2017-11-05 MED ORDER — ESCITALOPRAM OXALATE 20 MG PO TABS
20.0000 mg | ORAL_TABLET | Freq: Every day | ORAL | 1 refills | Status: DC
Start: 2017-11-05 — End: 2018-09-11

## 2017-11-05 MED ORDER — HYDROCHLOROTHIAZIDE 25 MG PO TABS
25.0000 mg | ORAL_TABLET | Freq: Every day | ORAL | 1 refills | Status: DC
Start: 2017-11-05 — End: 2018-09-30

## 2017-11-05 MED FILL — HYDROCHLOROTHIAZIDE 25 MG T: 25 | 90 days supply | Qty: 90 | Fill #0

## 2017-11-05 MED FILL — ENALAPRIL MALEATE 20 MG TAB: 20 | 90 days supply | Qty: 180 | Fill #0

## 2017-11-05 MED FILL — ESCITALOPRAM 20 MG TABLET: 20 | 90 days supply | Qty: 90 | Fill #0

## 2017-11-05 NOTE — Telephone Encounter (Signed)
Done

## 2017-11-05 NOTE — Progress Notes (Signed)
Subjective: Presents for routine follow-up on her hypertension.  Gets regular preventive health physicals with gynecology.  Has had normal Pap smears.  Has had slow steady weight gain over the past 4 years, states her initial weight was 175 pounds.  Currently raising her grandchildren, very busy eating out a lot with fast food.  Works, sitting most of the time.  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  Has frequent swelling in the lower legs especially after sitting for long periods of time.  No chest pain/ischemic type pain unusual shortness of breath or orthopnea.  Compliant with medications.  Also complaints of sinus pressure for the past 9 days.  Maxillary area pressure.  Left ear pain.  No fever.  Scratchy throat.  Slight cough producing yellow mucus.  Objective:   BP (!) 144/86   Temp 98.3 F (36.8 C) (Oral)   Ht 5\' 6"  (1.676 m)   Wt 245 lb (111.1 kg)   BMI 39.54 kg/m  NAD.  Alert, oriented.  TMs retracted, no erythema.  Pharynx injected with PND noted.  Neck supple with mild soft anterior adenopathy.  Lungs clear.  Heart regular rate and rhythm.  No murmur or gallop noted.  Carotids no bruits or thrills.  Lower extremities 1+ pitting edema with multiple varicose veins and venous stasis color change noted.  Assessment:   Problem List Items Addressed This Visit      Cardiovascular and Mediastinum   Essential hypertension - Primary   Relevant Medications   enalapril (VASOTEC) 20 MG tablet   hydrochlorothiazide (HYDRODIURIL) 25 MG tablet   Other Relevant Orders   Basic metabolic panel     Other   Depression with anxiety   Relevant Medications   escitalopram (LEXAPRO) 20 MG tablet   Morbid obesity (HCC)   Venous stasis    Other Visit Diagnoses    Acute non-recurrent maxillary sinusitis       Relevant Medications   cefdinir (OMNICEF) 300 MG capsule   Screening for lipid disorders       Relevant Orders   Lipid panel   High risk medication use       Relevant  Orders   Hepatic function panel   Other fatigue       Relevant Orders   TSH   VITAMIN D 25 Hydroxy (Vit-D Deficiency, Fractures)        Plan:   Meds ordered this encounter  Medications  . cefdinir (OMNICEF) 300 MG capsule    Sig: Take 1 capsule (300 mg total) by mouth 2 (two) times daily.    Dispense:  20 capsule    Refill:  0    Order Specific Question:   Supervising Provider    Answer:   Mikey Kirschner [[id number]]  . enalapril (VASOTEC) 20 MG tablet    Sig: Take 1 tablet (20 mg total) by mouth 2 (two) times daily.    Dispense:  180 tablet    Refill:  1    Order Specific Question:   Supervising Provider    Answer:   Mikey Kirschner [[id number]]  . hydrochlorothiazide (HYDRODIURIL) 25 MG tablet    Sig: Take 1 tablet (25 mg total) by mouth daily.    Dispense:  90 tablet    Refill:  1    Order Specific Question:   Supervising Provider    Answer:   Mikey Kirschner [[id number]]  . escitalopram (LEXAPRO) 20 MG tablet    Sig: Take 1 tablet (20 mg  total) by mouth daily.    Dispense:  90 tablet    Refill:  1    Needs office visit    Order Specific Question:   Supervising Provider    Answer:   Mikey Kirschner [[id number]]   OTC meds as directed for congestion and cough.  Patient has to avoid any stimulant medication due to palpitations.  Discussed importance of regular activity such as walking.  Given prescription for her employer to see if she can get a standing desk which may help.  Discussed importance of weight loss, initial goal is 10 pounds.  Lab work pending.  Recommend preventive health physical. Return in about 6 months (around 05/05/2018) for recheck.

## 2017-11-05 NOTE — Telephone Encounter (Signed)
Seen today. Wants 90 day supply of lexapro sent to [facility].

## 2017-11-08 DIAGNOSIS — Z79899 Other long term (current) drug therapy: Secondary | ICD-10-CM | POA: Diagnosis not present

## 2017-11-08 DIAGNOSIS — I10 Essential (primary) hypertension: Secondary | ICD-10-CM | POA: Diagnosis not present

## 2017-11-08 DIAGNOSIS — Z13220 Encounter for screening for lipoid disorders: Secondary | ICD-10-CM | POA: Diagnosis not present

## 2017-11-08 DIAGNOSIS — R5383 Other fatigue: Secondary | ICD-10-CM | POA: Diagnosis not present

## 2017-11-09 LAB — BASIC METABOLIC PANEL
BUN/Creatinine Ratio: 17 (ref 12–28)
BUN: 16 mg/dL (ref 8–27)
CALCIUM: 10 mg/dL (ref 8.7–10.3)
CO2: 26 mmol/L (ref 20–29)
Chloride: 102 mmol/L (ref 96–106)
Creatinine, Ser: 0.92 mg/dL (ref 0.57–1.00)
GFR calc Af Amer: 76 mL/min/{1.73_m2} (ref >59)
GFR, EST NON AFRICAN AMERICAN: 66 mL/min/{1.73_m2} (ref >59)
Glucose: 85 mg/dL (ref 65–99)
Potassium: 4.3 mmol/L (ref 3.5–5.2)
Sodium: 143 mmol/L (ref 134–144)

## 2017-11-09 LAB — HEPATIC FUNCTION PANEL
ALBUMIN: 4.5 g/dL (ref 3.6–4.8)
ALT: 12 IU/L (ref 0–32)
AST: 16 IU/L (ref 0–40)
Alkaline Phosphatase: 97 IU/L (ref 39–117)
Bilirubin Total: 0.4 mg/dL (ref 0.0–1.2)
Bilirubin, Direct: 0.12 mg/dL (ref 0.00–0.40)
TOTAL PROTEIN: 6.7 g/dL (ref 6.0–8.5)

## 2017-11-09 LAB — LIPID PANEL
Chol/HDL Ratio: 2.6 ratio (ref 0.0–4.4)
Cholesterol, Total: 240 mg/dL — ABNORMAL HIGH (ref 100–199)
HDL: 93 mg/dL (ref >39)
LDL CALC: 132 mg/dL — AB (ref 0–99)
Triglycerides: 74 mg/dL (ref 0–149)
VLDL Cholesterol Cal: 15 mg/dL (ref 5–40)

## 2017-11-09 LAB — VITAMIN D 25 HYDROXY (VIT D DEFICIENCY, FRACTURES): VIT D 25 HYDROXY: 32.1 ng/mL (ref 30.0–100.0)

## 2017-11-09 LAB — TSH: TSH: 2.63 u[IU]/mL (ref 0.450–4.500)

## 2017-11-23 ENCOUNTER — Other Ambulatory Visit: Payer: Self-pay

## 2017-11-23 ENCOUNTER — Ambulatory Visit (HOSPITAL_COMMUNITY)
Admission: EM | Admit: 2017-11-23 | Discharge: 2017-11-23 | Disposition: A | Discharge status: Home / Self Care | Payer: 59 | Attending: Family Medicine | Admitting: Family Medicine

## 2017-11-23 DIAGNOSIS — M1990 Unspecified osteoarthritis, unspecified site: Secondary | ICD-10-CM | POA: Diagnosis not present

## 2017-11-23 DIAGNOSIS — Z87891 Personal history of nicotine dependence: Secondary | ICD-10-CM | POA: Diagnosis not present

## 2017-11-23 DIAGNOSIS — J029 Acute pharyngitis, unspecified: Secondary | ICD-10-CM

## 2017-11-23 DIAGNOSIS — I493 Ventricular premature depolarization: Secondary | ICD-10-CM | POA: Insufficient documentation

## 2017-11-23 DIAGNOSIS — F418 Other specified anxiety disorders: Secondary | ICD-10-CM | POA: Diagnosis not present

## 2017-11-23 DIAGNOSIS — Z79899 Other long term (current) drug therapy: Secondary | ICD-10-CM | POA: Diagnosis not present

## 2017-11-23 DIAGNOSIS — I10 Essential (primary) hypertension: Secondary | ICD-10-CM | POA: Insufficient documentation

## 2017-11-23 DIAGNOSIS — H6592 Unspecified nonsuppurative otitis media, left ear: Secondary | ICD-10-CM

## 2017-11-23 DIAGNOSIS — H938X2 Other specified disorders of left ear: Secondary | ICD-10-CM | POA: Diagnosis not present

## 2017-11-23 DIAGNOSIS — R0981 Nasal congestion: Secondary | ICD-10-CM | POA: Diagnosis present

## 2017-11-23 LAB — POCT RAPID STREP A: Streptococcus, Group A Screen (Direct): NEGATIVE

## 2017-11-23 MED ORDER — PREDNISONE 20 MG PO TABS: 40.0000 mg | ORAL_TABLET | Freq: Every day | ORAL | 0 refills | Status: AC

## 2017-11-23 NOTE — ED Triage Notes (Addendum)
Reports going to PCP 8 days ago; was diagnosed with sinus infection & started cefdinir.  States has not had any improvement yet, and c/o "really bad sore throat".  C/O slightly elevated temps.

## 2017-11-23 NOTE — ED Provider Notes (Addendum)
[facility]    CSN: [id number] Arrival date & time: 11/23/17  1518     History   Chief Complaint Chief Complaint  Patient presents with  . Nasal Congestion  . Sore Throat    HPI Jennifer Coleman is a 67 y.o. female.   HPI Duration: 9 days  Associated symptoms: sinus congestion, sinus pain, rhinorrhea, sore throat, wheezing and low grade fevers, cough Denies: itchy watery eyes, ear drainage, myalgia and rogprs Treatment to date: Omnicef, Flonase, Zyrtec, Sudafed Sick contacts: No    Past Medical History:  Diagnosis Date  . Anxiety   . Arthritis   . Family history of anesthesia complication 25 yrs ago   Sister allergic to sccinocholine - unable to come off ventilator  . Hypertension   . Pneumonia   . PONV (postoperative nausea and vomiting)   . Premature ventricular contractions   . Shingles   . Varicose veins   . Varicose veins of left lower extremity     Patient Active Problem List   Diagnosis Date Noted  . Morbid obesity ([place]) 11/05/2017  . Depression with anxiety 11/05/2017  . Nontraumatic incomplete tear of right rotator cuff 09/25/2016  . Foreign body of shoulder right  09/25/2016  . 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 Shoulder joint painful on movement, right 08/29/2016  . Epigastric pain 08/11/2015  . Chest pain, rule out acute myocardial infarction 08/11/2015  . Renal insufficiency 08/11/2015  . Venous stasis 04/13/2015  . Depression 04/13/2015  . Essential hypertension 09/12/2014  . PVCs (premature ventricular contractions) 09/07/2014  . Precordial pain 09/07/2014    Past Surgical History:  Procedure Laterality Date  . CARPAL TUNNEL RELEASE    . CERVICAL DISC SURGERY  1992   Fusion  . COLONOSCOPY N/A 06/07/2016   Procedure: COLONOSCOPY;  Surgeon: Rogene Houston, MD;  [facility];  Service: Endoscopy;  Laterality: N/A;  1:00  . KNEE ARTHROSCOPY  09/16/2012   Procedure: ARTHROSCOPY KNEE;  Surgeon: [name], MD;  Location: [facility];  Service: Orthopedics;  Laterality: Right;  Right Knee Arthroscopy  . SALPINGOOPHORECTOMY Left 1985  . SHOULDER ARTHROSCOPY WITH OPEN ROTATOR CUFF REPAIR AND DISTAL CLAVICLE ACROMINECTOMY Right 05/03/2016   Procedure: RIGHT SHOULDER ARTHROSCOPY WITH MINI-OPEN ROTATOR CUFF REPAIR,DISTAL CLAVICLE RESECTION, SUBACROMIAL DECOMPRESSION.;  Surgeon: [name], MD;  Location: [facility];  Service: Orthopedics;  Laterality: Right;  . SHOULDER ARTHROSCOPY WITH ROTATOR CUFF REPAIR Right 09/25/2016   Procedure: Right Shoulder Manipulation, Arthroscopic Debridement of Joint, Removal of Loose Body and Re-Repair of Rotator Cuff;  Surgeon: [name], MD;  Location: [facility];  Service: Orthopedics;  Laterality: Right;     Home Medications    Prior to Admission medications   Medication Sig Start Date End Date Taking? Authorizing Provider  Calcium Carbonate-Vit D-Min (GNP CALCIUM PLUS 600 +D PO) Take 1 tablet by mouth 2 (two) times daily.   Yes [provider]  cefdinir (OMNICEF) 300 MG capsule Take 1 capsule (300 mg total) by mouth 2 (two) times daily. 11/05/17  Yes Nilda Simmer, NP  cetirizine (ZYRTEC) 10 MG tablet Take 10 mg by mouth daily as needed for allergies.    Yes [provider]  Coenzyme Q10 (COQ-10) 200 MG CAPS Take 200 mg by mouth daily after lunch.   Yes [provider]  diclofenac sodium (VOLTAREN) 1 % GEL Apply to right knee 3 times a day as needed. 10/26/16  Yes [place],  Scott A, MD  enalapril (VASOTEC) 20 MG tablet Take 1 tablet (20 mg total) by mouth 2 (two) times daily. 11/05/17  Yes Nilda Simmer, NP  escitalopram (LEXAPRO) 20 MG tablet Take 1 tablet (20 mg total) by mouth daily. 11/05/17  Yes Nilda Simmer, NP  Estradiol-Norethindrone Acet 0.5-0.1 MG tablet Take 1 tablet by mouth every evening.  04/20/16  Yes [provider]  fluticasone (FLONASE) 50 MCG/ACT nasal spray Place 2 sprays into  both nostrils daily. 10/26/16  Yes Kathyrn Drown, MD  hydrochlorothiazide (HYDRODIURIL) 25 MG tablet Take 1 tablet (25 mg total) by mouth daily. 11/05/17  Yes Nilda Simmer, NP  Multiple Vitamins-Minerals (MULTIVITAMINS THER. W/MINERALS) TABS Take 1 tablet by mouth daily.   Yes [provider]  PROAIR HFA 108 (90 Base) MCG/ACT inhaler INHALE TWO PUFFS BY MOUTH EVERY 4 TO 6 HOURS AS NEEDED FOR FOR WHEEZING 11/07/16  Yes [provider]  ibuprofen (ADVIL,MOTRIN) 800 MG tablet Take 1 tablet (800 mg total) by mouth 3 (three) times daily as needed. Patient taking differently: Take 800 mg by mouth 3 (three) times daily as needed (for pain.).  06/25/16   Kathyrn Drown, MD  predniSONE (DELTASONE) 20 MG tablet Take 2 tablets (40 mg total) by mouth daily with breakfast for 5 days. 11/23/17 11/28/17  Shelda Pal, DO    Family History Family History  Problem Relation Age of Onset  . Heart disease Sister   . Heart disease Brother        79's  . Diabetes Mellitus II Sister   . Multiple myeloma Mother   . Hypertension Sister   . Hypertension Father   . Heart attack Father     Social History Social History   Tobacco Use  . Smoking status: Former Smoker    Types: Cigarettes  . Smokeless tobacco: Former Systems developer    Quit date: 01/12/1997  Substance Use Topics  . Alcohol use: No    Alcohol/week: 0.0 oz  . Drug use: No     Allergies   Penicillins; Shellfish allergy; Morphine and related; and Codeine   Review of Systems Review of Systems  HENT: Positive for congestion.   Respiratory: Positive for wheezing.      Physical Exam Triage Vital Signs ED Triage Vitals  Enc Vitals Group     BP 11/23/17 1644 (!) 154/74     Pulse Rate 11/23/17 1644 65     Resp 11/23/17 1644 18     Temp 11/23/17 1644 97.6 F (36.4 C)     Temp Source 11/23/17 1644 Oral     SpO2 11/23/17 1644 99 %   Updated Vital Signs BP (!) 154/74   Pulse 65   Temp 97.6 F (36.4 C) (Oral)    Resp 18   SpO2 99%   Physical Exam  Constitutional: She appears well-developed and well-nourished.  HENT:  Head: Normocephalic.  Right Ear: Hearing, tympanic membrane and ear canal normal.  Left Ear: Hearing and ear canal normal. A middle ear effusion is present.  Mouth/Throat: Oropharynx is clear and moist and mucous membranes are normal. No oropharyngeal exudate.  Eyes: EOM are normal. Pupils are equal, round, and reactive to light.  Neck: Normal range of motion. Neck supple.  Cardiovascular: Normal rate and regular rhythm.  Pulmonary/Chest: Effort normal and breath sounds normal. No respiratory distress. She has no wheezes.  Lymphadenopathy:    She has no cervical adenopathy.  Psychiatric: She has a normal mood and affect.  Her behavior is normal.     UC Treatments / Results  Labs (all labs ordered are listed, but only abnormal results are displayed) Labs Reviewed  CULTURE, GROUP A STREP [facility])  POCT RAPID STREP A   Procedures Procedures none  Initial Impression / Assessment and Plan / UC Course  I have reviewed the triage vital signs and the nursing notes.  Pertinent labs & imaging results that were available during my care of the patient were reviewed by me and considered in my medical decision making (see chart for details).     67 yo F presents with URI s/s's. Exam consistent with ear effusion, will tx with steroids to open up eust tube. No signs of bacterial infection warranting further abx. Cont supportive care. F/u prn. Pt voiced understanding and agreement to the plan.   Final Clinical Impressions(s) / UC Diagnoses   Final diagnoses:  Fluid level behind tympanic membrane of left ear    ED Discharge Orders        Ordered    predniSONE (DELTASONE) 20 MG tablet  Daily with breakfast     11/23/17 1716       Controlled Substance Prescriptions [place] Controlled Substance Registry consulted? Not Applicable   Shelda Pal, DO 11/23/17 [place], [place], DO 11/23/17 1726

## 2017-11-23 NOTE — Discharge Instructions (Signed)
Continue to push fluids, practice good hand hygiene, and cover your mouth if you cough.  If you start having fevers, shaking or shortness of breath, seek immediate care.  

## 2017-11-25 LAB — CULTURE, GROUP A STREP (THRC)

## 2017-12-04 ENCOUNTER — Telehealth: Payer: Self-pay | Admitting: Family Medicine

## 2017-12-04 ENCOUNTER — Other Ambulatory Visit: Payer: Self-pay | Admitting: Nurse Practitioner

## 2017-12-04 MED ORDER — PROAIR HFA 108 (90 BASE) MCG/ACT IN AERS: INHALATION_SPRAY | RESPIRATORY_TRACT | 0 refills | Status: DC

## 2017-12-04 NOTE — Telephone Encounter (Signed)
Pt is requesting a proair inhaler to be called in. Pt states that she had one that she was prescribed several years ago and threw it away because it was old. Please advise.    [place]

## 2017-12-04 NOTE — Telephone Encounter (Signed)
Patient states she saw Jennifer Coleman 2-3 weeks ago was given antibx,got worse had to go to urgent care they gave her a dose of prednisone and she still having a lingering cough would like inhaler. Is this ok? Will you send in one or does pt need to be seen again?

## 2017-12-04 NOTE — Telephone Encounter (Signed)
Refilled inhaler. Office visit next week if no better.

## 2017-12-04 NOTE — Telephone Encounter (Signed)
Left message to return call 

## 2017-12-05 NOTE — Telephone Encounter (Signed)
Patient notified and verbalized understanding. 

## 2018-01-03 ENCOUNTER — Encounter: Payer: Self-pay | Admitting: Nurse Practitioner

## 2018-01-03 ENCOUNTER — Ambulatory Visit (INDEPENDENT_AMBULATORY_CARE_PROVIDER_SITE_OTHER): Payer: 59 | Admitting: Nurse Practitioner

## 2018-01-03 VITALS — BP 132/80 | Ht 65.5 in | Wt 239.0 lb

## 2018-01-03 DIAGNOSIS — Z1151 Encounter for screening for human papillomavirus (HPV): Secondary | ICD-10-CM

## 2018-01-03 DIAGNOSIS — Z124 Encounter for screening for malignant neoplasm of cervix: Secondary | ICD-10-CM

## 2018-01-03 DIAGNOSIS — L578 Other skin changes due to chronic exposure to nonionizing radiation: Secondary | ICD-10-CM | POA: Diagnosis not present

## 2018-01-03 DIAGNOSIS — N951 Menopausal and female climacteric states: Secondary | ICD-10-CM | POA: Diagnosis not present

## 2018-01-03 DIAGNOSIS — Z0000 Encounter for general adult medical examination without abnormal findings: Secondary | ICD-10-CM

## 2018-01-03 MED ORDER — GABAPENTIN 100 MG PO CAPS: 100.0000 mg | ORAL_CAPSULE | Freq: Every day | ORAL | 5 refills | Status: DC

## 2018-01-03 NOTE — Progress Notes (Addendum)
Subjective:    Patient ID: Jennifer Coleman, female    DOB: 1951/02/12, 67 y.o.   MRN: [id number]  HPI Presents for her wellness exam. No bleeding or pelvic pain. Same sexual partner. Last PAP in 2017 was normal. Regular vision and dental exams. Goes to [facility] once a year for skin cancer screening. Has had several lesions removed. Was on hormones for years through her gynecologist. Stopped about 6 weeks ago. Having hot flashes unrelieved with OTC supplements. Regular walking program.     Review of Systems  Constitutional: Negative for activity change, appetite change and fatigue.  HENT: Negative for dental problem, ear pain, sinus pressure and sore throat.   Respiratory: Negative for cough, chest tightness, shortness of breath and wheezing.   Cardiovascular: Negative for chest pain.  Gastrointestinal: Negative for abdominal distention, abdominal pain, blood in stool, constipation, diarrhea, nausea and vomiting.  Genitourinary: Negative for difficulty urinating, dysuria, enuresis, frequency, genital sores, pelvic pain, urgency, vaginal bleeding and vaginal discharge.   Depression screen PHQ 2/9 01/04/2018  Decreased Interest 0  Down, Depressed, Hopeless 0  PHQ - 2 Score 0       Objective:   Physical Exam  Constitutional: She is oriented to person, place, and time. She appears well-developed. No distress.  HENT:  Right Ear: External ear normal.  Left Ear: External ear normal.  Mouth/Throat: Oropharynx is clear and moist.  Neck: Normal range of motion. Neck supple. No tracheal deviation present. No thyromegaly present.  Cardiovascular: Normal rate, regular rhythm and normal heart sounds. Exam reveals no gallop.  No murmur heard. Pulmonary/Chest: Effort normal and breath sounds normal. Right breast exhibits no inverted nipple, no mass, no skin change and no tenderness. Left breast exhibits no inverted nipple, no mass, no skin change and no tenderness. Breasts are symmetrical.    Axillae no adenopathy.   Abdominal: Soft. She exhibits no distension. There is no tenderness.  Genitourinary: Vagina normal and uterus normal. No vaginal discharge found.  Genitourinary Comments: External GU: no rashes or lesions. Vagina: pale, no discharge. Cervix: a small superficial lesion noted near the os around 3 o'clock. A swab obtained of this area for PAP. No CMT. Bimanual exam: no tenderness or obvious masses.   Musculoskeletal: She exhibits no edema.  Lymphadenopathy:    She has no cervical adenopathy.  Neurological: She is alert and oriented to person, place, and time.  Skin: Skin is warm and dry. No rash noted.  Significant sun damage noted.   Psychiatric: She has a normal mood and affect. Her behavior is normal.  Vitals reviewed.         Assessment & Plan:   Problem List Items Addressed This Visit      Musculoskeletal and Integument   Sun-damaged skin    Other Visit Diagnoses    Routine general medical examination at a health care facility    -  Primary   Relevant Orders   Pap IG and HPV (high risk) DNA detection   Screening for cervical cancer       Relevant Orders   Pap IG and HPV (high risk) DNA detection   Screening for HPV (human papillomavirus)       Relevant Orders   Pap IG and HPV (high risk) DNA detection   Hot flashes due to menopause           Recommend continued activity and healthy diet.  Daily vitamin D and calcium. Meds ordered this encounter  Medications  . gabapentin (  NEURONTIN) 100 MG capsule    Sig: Take 1 capsule (100 mg total) by mouth at bedtime.    Dispense:  30 capsule    Refill:  5    Order Specific Question:   Supervising Provider    Answer:   Mikey Kirschner [[id number]]   Call back if no improvement in hot flashes.  Return in about 1 year (around 01/04/2019) for physical.

## 2018-01-08 LAB — PAP IG AND HPV HIGH-RISK
HPV, HIGH-RISK: NEGATIVE
PAP SMEAR COMMENT: 0

## 2018-04-09 MED FILL — ESCITALOPRAM 20 MG TABLET: 20 | 90 days supply | Qty: 90 | Fill #1

## 2018-05-12 ENCOUNTER — Ambulatory Visit (INDEPENDENT_AMBULATORY_CARE_PROVIDER_SITE_OTHER): Payer: 59 | Admitting: Nurse Practitioner

## 2018-05-12 ENCOUNTER — Encounter: Payer: Self-pay | Admitting: Nurse Practitioner

## 2018-05-12 VITALS — BP 130/92 | Temp 99.1°F | Ht 65.5 in | Wt 237.0 lb

## 2018-05-12 DIAGNOSIS — J209 Acute bronchitis, unspecified: Secondary | ICD-10-CM

## 2018-05-12 DIAGNOSIS — B9689 Other specified bacterial agents as the cause of diseases classified elsewhere: Secondary | ICD-10-CM

## 2018-05-12 DIAGNOSIS — J019 Acute sinusitis, unspecified: Secondary | ICD-10-CM | POA: Diagnosis not present

## 2018-05-12 MED ORDER — IBUPROFEN 800 MG PO TABS: 800.0000 mg | ORAL_TABLET | Freq: Three times a day (TID) | ORAL | 0 refills | Status: DC | PRN

## 2018-05-12 MED ORDER — METHYLPREDNISOLONE ACETATE 40 MG/ML IJ SUSP
40.0000 mg | Freq: Once | INTRAMUSCULAR | Status: AC
  Administered 2018-05-12: 40 mg via INTRAMUSCULAR

## 2018-05-12 MED ORDER — PREDNISONE 20 MG PO TABS: ORAL_TABLET | ORAL | 0 refills | Status: DC

## 2018-05-12 MED ORDER — BENZONATATE 100 MG PO CAPS: 100.0000 mg | ORAL_CAPSULE | Freq: Three times a day (TID) | ORAL | 0 refills | Status: DC | PRN

## 2018-05-12 MED ORDER — CEFDINIR 300 MG PO CAPS: 300.0000 mg | ORAL_CAPSULE | Freq: Two times a day (BID) | ORAL | 0 refills | Status: DC

## 2018-05-12 NOTE — Progress Notes (Signed)
Subjective: Presents for complaints of cough and congestion over the past 3 days.  Fever and chills.  Malaise.  Sore throat.  Facial and generalized area headache.  Frequent cough producing minimal amount of tan to brown mucus.  Slightly blood-tinged this morning.  Occasional posttussive gagging.  Left ear pain.  Using her albuterol inhaler about every 3-4 hours which helps for short period of time.  Last used about 2-1/2 hours ago.  No relief with OTC meds.  Objective:   BP (!) 130/92   Temp 99.1 F (37.3 C) (Oral)   Ht 5' 5.5" (1.664 m)   Wt 237 lb (107.5 kg)   BMI 38.84 kg/m  NAD.  Alert, oriented.  TMs retracted, more on the left, no erythema.  Posterior pharynx erythematous with green PND noted.  Neck supple with mild soft anterior adenopathy.  Lungs faint coarse expiratory crackles noted throughout lung fields.  No wheezing or tachypnea.  Frequent nonproductive cough.  Heart regular rate and rhythm.  Assessment:  Acute bacterial rhinosinusitis - Plan: methylPREDNISolone acetate (DEPO-MEDROL) injection 40 mg  Acute bronchitis, unspecified organism - Plan: methylPREDNISolone acetate (DEPO-MEDROL) injection 40 mg    Plan:   Meds ordered this encounter  Medications  . benzonatate (TESSALON) 100 MG capsule    Sig: Take 1 capsule (100 mg total) by mouth 3 (three) times daily as needed for cough.    Dispense:  30 capsule    Refill:  0    Order Specific Question:   Supervising Provider    Answer:   Mikey Kirschner [[id number]]  . ibuprofen (ADVIL,MOTRIN) 800 MG tablet    Sig: Take 1 tablet (800 mg total) by mouth 3 (three) times daily as needed.    Dispense:  90 tablet    Refill:  0    Order Specific Question:   Supervising Provider    Answer:   Mikey Kirschner [[id number]]  . cefdinir (OMNICEF) 300 MG capsule    Sig: Take 1 capsule (300 mg total) by mouth 2 (two) times daily.    Dispense:  20 capsule    Refill:  0    Has taken this without difficulty in the past    Order Specific  Question:   Supervising Provider    Answer:   Mikey Kirschner [[id number]]  . predniSONE (DELTASONE) 20 MG tablet    Sig: 3 po qd x 3 d then 2 po qd x 3 d then 1 po qd x 2 d; start 7/16    Dispense:  17 tablet    Refill:  0    Order Specific Question:   Supervising Provider    Answer:   Mikey Kirschner [[id number]]  . methylPREDNISolone acetate (DEPO-MEDROL) injection 40 mg   Patient requesting a Depo-Medrol shot today.  Start prednisone tomorrow.  Note she is taken [facility] without difficulty in the past.  Requests a refill on her ibuprofen 800 mg, do not use while she is on prednisone due to potential GI side effects.  Warning signs reviewed.  Call back in 3 to 4 days if no improvement, call or go to ED sooner if worse.

## 2018-05-23 ENCOUNTER — Other Ambulatory Visit: Payer: Self-pay | Admitting: Nurse Practitioner

## 2018-05-23 ENCOUNTER — Telehealth: Payer: Self-pay | Admitting: Family Medicine

## 2018-05-23 MED ORDER — LEVOFLOXACIN 500 MG PO TABS: 500.0000 mg | ORAL_TABLET | Freq: Every day | ORAL | 0 refills | Status: DC

## 2018-05-23 MED ORDER — DOXYCYCLINE HYCLATE 100 MG PO TABS: 100.0000 mg | ORAL_TABLET | Freq: Two times a day (BID) | ORAL | 0 refills | Status: DC

## 2018-05-23 NOTE — Telephone Encounter (Signed)
Sent in [place]send which is a high powered antibiotic. If she is no better on this, needs to be seen for recheck. Thanks.

## 2018-05-23 NOTE — Telephone Encounter (Signed)
Switched to Doxycyline.

## 2018-05-23 NOTE — Telephone Encounter (Signed)
Patient had an appointment with [name] on 05/12/18.  She finished the medication, and she still has a raw red throat, sinus and ear pressure, yellow green drainage.  She would like another Rx called in.  [facility]

## 2018-05-23 NOTE — Telephone Encounter (Signed)
Pt states that Levaquin makes her feel weird and wanted to know if something else could be called in.

## 2018-05-23 NOTE — Telephone Encounter (Signed)
Left message to return call 

## 2018-06-03 ENCOUNTER — Encounter: Payer: Self-pay | Admitting: Family Medicine

## 2018-06-03 ENCOUNTER — Ambulatory Visit (INDEPENDENT_AMBULATORY_CARE_PROVIDER_SITE_OTHER): Payer: 59 | Admitting: Family Medicine

## 2018-06-03 VITALS — BP 138/86 | Temp 99.2°F | Ht 66.0 in | Wt 243.1 lb

## 2018-06-03 DIAGNOSIS — R5383 Other fatigue: Secondary | ICD-10-CM

## 2018-06-03 DIAGNOSIS — J029 Acute pharyngitis, unspecified: Secondary | ICD-10-CM

## 2018-06-03 LAB — POCT RAPID STREP A (OFFICE): Rapid Strep A Screen: NEGATIVE

## 2018-06-03 LAB — POCT HEMOGLOBIN: Hemoglobin: 12.7 g/dL (ref 12.2–16.2)

## 2018-06-03 MED ORDER — CEFPROZIL 500 MG PO TABS: 500.0000 mg | ORAL_TABLET | Freq: Two times a day (BID) | ORAL | 0 refills | Status: AC

## 2018-06-03 MED ORDER — MAGIC MOUTHWASH: ORAL | 0 refills | Status: DC

## 2018-06-03 NOTE — Progress Notes (Signed)
   Subjective:    Patient ID: Jennifer Coleman, female    DOB: [birth year], 67 y.o.   MRN: [id number]  HPI  Patient is here today with complaints of sore throat,low grade fever and fatigue for a month now. She states she has been on two antibx over the last month. Has taken steroid shot and has had a pred pack.  Complete prior records reviewed.  Patient is frustrated that she continues to have fatigue and tiredness and has been through couple rounds of antibiotics.  Ongoing sore throat intermittent.  Some cough and drainage.  Some mild headache intermittently.  Review of Systems      Results for orders placed or performed in visit on 06/03/18  POCT rapid strep A  Result Value Ref Range   Rapid Strep A Screen Negative Negative  POCT hemoglobin  Result Value Ref Range   Hemoglobin 12.7 12.2 - 16.2 g/dL   No vomiting no rash no diarrhea  Objective:   Physical Exam  Alert, mild malaise. Hydration good Vitals stable. frontal/ maxillary tenderness evident positive nasal congestion. pharynx normal neck supple  lungs clear/no crackles or wheezes. heart regular in rhythm       Assessment & Plan:  Impression rhinosinusitis likely post viral, discussed with patient. plan antibiotics prescribed. Questions answered. Symptomatic care discussed. warning signs discussed. WSL Plus persistent fatigue.  Hopefully this will resolve with this round of antibiotics and Magic mouthwash discussed

## 2018-06-04 LAB — STREP A DNA PROBE: Strep Gp A Direct, DNA Probe: NEGATIVE

## 2018-06-04 LAB — SPECIMEN STATUS REPORT

## 2018-07-02 ENCOUNTER — Ambulatory Visit (INDEPENDENT_AMBULATORY_CARE_PROVIDER_SITE_OTHER): Payer: 59 | Admitting: Orthopaedic Surgery

## 2018-07-02 ENCOUNTER — Encounter (INDEPENDENT_AMBULATORY_CARE_PROVIDER_SITE_OTHER): Payer: Self-pay | Admitting: Orthopaedic Surgery

## 2018-07-02 ENCOUNTER — Ambulatory Visit (INDEPENDENT_AMBULATORY_CARE_PROVIDER_SITE_OTHER): Payer: 59

## 2018-07-02 VITALS — BP 134/65 | HR 56 | Ht 66.0 in | Wt 225.0 lb

## 2018-07-02 DIAGNOSIS — M25512 Pain in left shoulder: Secondary | ICD-10-CM | POA: Diagnosis not present

## 2018-07-02 NOTE — Progress Notes (Signed)
Office Visit Note   Patient: Jennifer Coleman           Date of Birth: 1951/05/30           MRN: [id number] Visit Date: 07/02/2018              Requested by: Mikey Kirschner, [place],  [id number] PCP: Mikey Kirschner, MD   Assessment & Plan: Visit Diagnoses:  1. Acute pain of left shoulder     Plan: Possible rotator cuff tear based on same diagnosis right shoulder.  Presently doing quite well with limitations.  Will monitor course over the next several weeks and consider an MRI scan if no improvement  Follow-Up Instructions: Return if symptoms worsen or fail to improve.   Orders:  Orders Placed This Encounter  Procedures  . XR Shoulder Left   No orders of the defined types were placed in this encounter.     Procedures: No procedures performed   Clinical Data: No additional findings.   Subjective: Chief Complaint  Patient presents with  . New Patient (Initial Visit)    06/21/18 SLIPPED AND FELL ON WET, LANDED ON OUT STRETCHED ARM FLOOR L ARM PAIN UP INTO NECK DOWN SHOULDER, ELBOW AND WRIST. CANT REACH OVERHEAD OR BEHIND  Jennifer Coleman relates that over the last 4 to 5 days she is feeling much better.  She is now able to place her left arm over her head and scratch her back without any problems.  She has some mild discomfort with motion of her shoulder.  No bruising.  Neurovascular exam appears to be intact.  No specific neck pain.  No problem with right shoulder  HPI  Review of Systems  Constitutional: Negative for fatigue and fever.  HENT: Negative for ear pain.   Eyes: Negative for pain.  Respiratory: Negative for cough and shortness of breath.   Cardiovascular: Negative for leg swelling.  Gastrointestinal: Negative for constipation and diarrhea.  Genitourinary: Negative for difficulty urinating.  Musculoskeletal: Positive for neck pain. Negative for back pain.  Skin: Negative for rash.  Allergic/Immunologic: Negative for food  allergies.  Neurological: Positive for weakness.  Hematological: Does not bruise/bleed easily.  Psychiatric/Behavioral: Negative for sleep disturbance.     Objective: Vital Signs: BP 134/65 (BP Location: Left Arm, Patient Position: Sitting, Cuff Size: Normal)   Pulse (!) 56   Ht '5\' 6"'$  (1.676 m)   Wt 225 lb (102.1 kg)   BMI 36.32 kg/m   Physical Exam  Constitutional: She is oriented to person, place, and time. She appears well-developed and well-nourished.  HENT:  Mouth/Throat: Oropharynx is clear and moist.  Eyes: Pupils are equal, round, and reactive to light. EOM are normal.  Pulmonary/Chest: Effort normal.  Neurological: She is alert and oriented to person, place, and time.  Skin: Skin is warm and dry.  Psychiatric: She has a normal mood and affect. Her behavior is normal.    Ortho Exam awake alert and oriented x3.  Comfortable sitting.  Minimal impingement testing of left shoulder.  No bruising.  No pain at the acromioclavicular joint of the subacromial region.  Large arms but biceps appears to be intact.  Good grip and good release.  Mild discomfort with overhead motion but full flexion.  Abduction 90 degrees with minimal discomfort.  Able to scratch the lumbosacral junction without much trouble.  No grating  Specialty Comments:  No specialty comments available.  Imaging: Xr Shoulder Left  Result Date:  07/02/2018 Films of the left shoulder obtained in several projections.  There is no evidence of a fracture or dislocation.  Some degenerative change at the acromioclavicular joint.  No ectopic calcification.  No evidence of glenohumeral arthritis.  Humeral head is centered about the glenoid    PMFS History: Patient Active Problem List   Diagnosis Date Noted  . Sun-damaged skin 01/03/2018  . Morbid obesity ([place]) 11/05/2017  . Depression with anxiety 11/05/2017  . Nontraumatic incomplete tear of right rotator cuff 09/25/2016  . Foreign body of shoulder right  09/25/2016    . 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 Shoulder joint painful on movement, right 08/29/2016  . Epigastric pain 08/11/2015  . Chest pain, rule out acute myocardial infarction 08/11/2015  . Renal insufficiency 08/11/2015  . Venous stasis 04/13/2015  . Depression 04/13/2015  . Essential hypertension 09/12/2014  . PVCs (premature ventricular contractions) 09/07/2014  . Precordial pain 09/07/2014   Past Medical History:  Diagnosis Date  . Anxiety   . Arthritis   . Family history of anesthesia complication 25 yrs ago   Sister allergic to sccinocholine - unable to come off ventilator  . Hypertension   . Pneumonia   . PONV (postoperative nausea and vomiting)   . Premature ventricular contractions   . Shingles   . Varicose veins   . Varicose veins of left lower extremity     Family History  Problem Relation Age of Onset  . Heart disease Sister   . Heart disease Brother        33's  . Diabetes Mellitus II Sister   . Multiple myeloma Mother   . Hypertension Sister   . Hypertension Father   . Heart attack Father     Past Surgical History:  Procedure Laterality Date  . CARPAL TUNNEL RELEASE    . CERVICAL DISC SURGERY  1992   Fusion  . COLONOSCOPY N/A 06/07/2016   Procedure: COLONOSCOPY;  Surgeon: Rogene Houston, MD;  [facility];  Service: Endoscopy;  Laterality: N/A;  1:00  . KNEE ARTHROSCOPY  09/16/2012   Procedure: ARTHROSCOPY KNEE;  Surgeon: [name], MD;  Location: [facility];  Service: Orthopedics;  Laterality: Right;  Right Knee Arthroscopy  . SALPINGOOPHORECTOMY Left 1985  . SHOULDER ARTHROSCOPY WITH OPEN ROTATOR CUFF REPAIR AND DISTAL CLAVICLE ACROMINECTOMY Right 05/03/2016   Procedure: RIGHT SHOULDER ARTHROSCOPY WITH MINI-OPEN ROTATOR CUFF REPAIR,DISTAL CLAVICLE RESECTION, SUBACROMIAL DECOMPRESSION.;  Surgeon: [name], MD;  Location: [place];  Service: Orthopedics;  Laterality: Right;  . SHOULDER ARTHROSCOPY WITH  ROTATOR CUFF REPAIR Right 09/25/2016   Procedure: Right Shoulder Manipulation, Arthroscopic Debridement of Joint, Removal of Loose Body and Re-Repair of Rotator Cuff;  Surgeon: [name], MD;  Location: [place];  Service: Orthopedics;  Laterality: Right;   Social History   Occupational History  . Not on file  Tobacco Use  . Smoking status: Former Smoker    Types: Cigarettes  . Smokeless tobacco: Former Systems developer    Quit date: 01/12/1997  Substance and Sexual Activity  . Alcohol use: No    Alcohol/week: 0.0 standard drinks  . Drug use: No  . Sexual activity: Yes

## 2018-07-18 MED FILL — ESCITALOPRAM 20 MG TABLET: 20 | 30 days supply | Qty: 30 | Fill #2

## 2018-07-31 ENCOUNTER — Ambulatory Visit (INDEPENDENT_AMBULATORY_CARE_PROVIDER_SITE_OTHER): Payer: 59 | Admitting: Otolaryngology

## 2018-07-31 ENCOUNTER — Other Ambulatory Visit (INDEPENDENT_AMBULATORY_CARE_PROVIDER_SITE_OTHER): Payer: Self-pay | Admitting: Otolaryngology

## 2018-07-31 DIAGNOSIS — J342 Deviated nasal septum: Secondary | ICD-10-CM

## 2018-07-31 DIAGNOSIS — J310 Chronic rhinitis: Secondary | ICD-10-CM | POA: Diagnosis not present

## 2018-07-31 DIAGNOSIS — J320 Chronic maxillary sinusitis: Secondary | ICD-10-CM

## 2018-07-31 DIAGNOSIS — J343 Hypertrophy of nasal turbinates: Secondary | ICD-10-CM | POA: Diagnosis not present

## 2018-08-05 ENCOUNTER — Encounter (HOSPITAL_COMMUNITY): Payer: Self-pay

## 2018-08-05 ENCOUNTER — Ambulatory Visit (HOSPITAL_COMMUNITY)
Admission: RE | Admit: 2018-08-05 | Discharge: 2018-08-05 | Disposition: A | Discharge status: Home / Self Care | Payer: 59 | Source: Ambulatory Visit | Attending: Otolaryngology | Admitting: Otolaryngology

## 2018-08-05 DIAGNOSIS — J320 Chronic maxillary sinusitis: Secondary | ICD-10-CM | POA: Insufficient documentation

## 2018-08-05 DIAGNOSIS — J329 Chronic sinusitis, unspecified: Secondary | ICD-10-CM | POA: Diagnosis not present

## 2018-08-05 NOTE — Therapy (Signed)
[facility] [place], [place], [address] Phone: [phone]   Fax:  [phone]  Patient Details  Name: Jennifer Coleman MRN: [id number] Date of Birth: [birth year] Referring Provider:  No ref. provider found  Encounter Date: 08/05/2018  OCCUPATIONAL THERAPY DISCHARGE SUMMARY  Visits from Start of Care: 8  Current functional level related to goals / functional outcomes: Pt did not return after her appointment on 12/292017. Patient was called to offer a reschedule option for missed appointment on snow day when clinic was closed. No return call was received. Patient is discharged from OT services.    Remaining deficits: Unknown   Education / Equipment: Shoulder HEP Plan: Patient agrees to discharge.  Patient goals were partially met. Patient is being discharged due to not returning since the last visit.  ?????         Ailene Ravel, OTR/L,CBIS  [phone]       08/05/2018, 4:44 PM  [facility] [address] [place], [place], [address] Phone: [phone]   Fax:  [phone]

## 2018-08-13 ENCOUNTER — Other Ambulatory Visit (INDEPENDENT_AMBULATORY_CARE_PROVIDER_SITE_OTHER): Payer: Self-pay | Admitting: Radiology

## 2018-08-13 ENCOUNTER — Other Ambulatory Visit (HOSPITAL_COMMUNITY): Payer: Self-pay | Admitting: Obstetrics and Gynecology

## 2018-08-13 ENCOUNTER — Ambulatory Visit (INDEPENDENT_AMBULATORY_CARE_PROVIDER_SITE_OTHER): Payer: 59 | Admitting: Orthopaedic Surgery

## 2018-08-13 ENCOUNTER — Other Ambulatory Visit (HOSPITAL_COMMUNITY): Payer: Self-pay

## 2018-08-13 ENCOUNTER — Encounter (INDEPENDENT_AMBULATORY_CARE_PROVIDER_SITE_OTHER): Payer: Self-pay | Admitting: Orthopaedic Surgery

## 2018-08-13 VITALS — BP 128/75 | HR 62 | Ht 66.0 in | Wt 210.0 lb

## 2018-08-13 DIAGNOSIS — M25512 Pain in left shoulder: Principal | ICD-10-CM

## 2018-08-13 DIAGNOSIS — G8929 Other chronic pain: Secondary | ICD-10-CM | POA: Diagnosis not present

## 2018-08-13 DIAGNOSIS — Z1231 Encounter for screening mammogram for malignant neoplasm of breast: Secondary | ICD-10-CM

## 2018-08-13 NOTE — Progress Notes (Signed)
Office Visit Note   Patient: Jennifer Coleman           Date of Birth: Mar 27, 1951           MRN: [id number] Visit Date: 08/13/2018              Requested by: Jennifer Coleman, [place], [place] [address] PCP: Jennifer Kirschner, MD   Assessment & Plan: Visit Diagnoses:  1. Chronic left shoulder pain     Plan: Left shoulder pain has recurred.  I am concerned about a rotator cuff tear similar to what she had on the right status post repair.  Will order MRI scan.  Has had time, exercises, medicines and even prednisone without much relief  Follow-Up Instructions: Return after MRI left shoulder.   Orders:  No orders of the defined types were placed in this encounter.  No orders of the defined types were placed in this encounter.     Procedures: No procedures performed   Clinical Data: No additional findings.   Subjective: Chief Complaint  Patient presents with  . Follow-up    L SHOULDER F/U NOT ANY BETTER STILL CATCHING, STABBING PAIN WHEN REACHING BEHIND BACK, NO INJECTIONS  Jennifer Coleman has been experiencing left shoulder pain for approximately 2 months.  Initial onset of pain after she slipped and fell on the wet ground with an outstretched left upper extremity.  "After a while" she started to feel better but over the last several weeks has had recurrent pain without recurrent injury or trauma.  She is having difficulty sleeping on that side.  She is had difficulty raising her arm over her head.  She recently had a course of prednisone for sinusitis which did not help her shoulder.  She has had prior rotator cuff tear repair on the right shoulder  HPI  Review of Systems  Constitutional: Negative for fatigue and fever.  HENT: Negative for ear pain.   Eyes: Negative for pain.  Respiratory: Negative for cough and shortness of breath.   Cardiovascular: Negative for leg swelling.  Gastrointestinal: Negative for constipation and diarrhea.  Genitourinary:  Negative for difficulty urinating.  Musculoskeletal: Negative for back pain and neck pain.  Skin: Negative for rash.  Allergic/Immunologic: Negative for food allergies.  Neurological: Positive for weakness. Negative for numbness.  Hematological: Does not bruise/bleed easily.  Psychiatric/Behavioral: Positive for sleep disturbance.     Objective: Vital Signs: BP 128/75 (BP Location: Left Arm, Patient Position: Sitting, Cuff Size: Normal)   Pulse 62   Ht _0  (1.676 m)   Wt 210 lb (95.3 kg)   BMI 33.89 kg/m   Physical Exam  Constitutional: She is oriented to person, place, and time. She appears well-developed and well-nourished.  HENT:  Mouth/Throat: Oropharynx is clear and moist.  Eyes: Pupils are equal, round, and reactive to light. EOM are normal.  Pulmonary/Chest: Effort normal.  Neurological: She is alert and oriented to person, place, and time.  Skin: Skin is warm and dry.  Psychiatric: She has a normal mood and affect. Her behavior is normal.    Ortho Exam left shoulder with positive impingement and empty can testing.  Able to place her arm fully overhead but with a circuitous motion.  Some discomfort along the anterior subacromial region.  Skin intact.  Biceps intact.  Good grip and good release.  Neurovascular exam intact.  Some weakness with external rotation either related to pain or possible rotator cuff tear  Specialty  Comments:  No specialty comments available.  Imaging: No results found.   PMFS History: Patient Active Problem List   Diagnosis Date Noted  . Sun-damaged skin 01/03/2018  . Morbid obesity ([place]) 11/05/2017  . Depression with anxiety 11/05/2017  . Nontraumatic incomplete tear of right rotator cuff 09/25/2016  . Foreign body of shoulder right  09/25/2016  . 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 Shoulder joint painful on movement, right 08/29/2016  . Epigastric pain 08/11/2015  . Chest pain, rule out acute myocardial infarction  08/11/2015  . Renal insufficiency 08/11/2015  . Venous stasis 04/13/2015  . Depression 04/13/2015  . Essential hypertension 09/12/2014  . PVCs (premature ventricular contractions) 09/07/2014  . Precordial pain 09/07/2014   Past Medical History:  Diagnosis Date  . Anxiety   . Arthritis   . Family history of anesthesia complication 25 yrs ago   Sister allergic to sccinocholine - unable to come off ventilator  . Hypertension   . Pneumonia   . PONV (postoperative nausea and vomiting)   . Premature ventricular contractions   . Shingles   . Varicose veins   . Varicose veins of left lower extremity     Family History  Problem Relation Age of Onset  . Heart disease Sister   . Heart disease Brother        32's  . Diabetes Mellitus II Sister   . Multiple myeloma Mother   . Hypertension Sister   . Hypertension Father   . Heart attack Father     Past Surgical History:  Procedure Laterality Date  . CARPAL TUNNEL RELEASE    . CERVICAL DISC SURGERY  1992   Fusion  . COLONOSCOPY N/A 06/07/2016   Procedure: COLONOSCOPY;  Surgeon: Jennifer Houston, MD;  [facility];  Service: Endoscopy;  Laterality: N/A;  1:00  . KNEE ARTHROSCOPY  09/16/2012   Procedure: ARTHROSCOPY KNEE;  Surgeon: Jennifer Balding, MD;  Location: [facility];  Service: Orthopedics;  Laterality: Right;  Right Knee Arthroscopy  . SALPINGOOPHORECTOMY Left 1985  . SHOULDER ARTHROSCOPY WITH OPEN ROTATOR CUFF REPAIR AND DISTAL CLAVICLE ACROMINECTOMY Right 05/03/2016   Procedure: RIGHT SHOULDER ARTHROSCOPY WITH MINI-OPEN ROTATOR CUFF REPAIR,DISTAL CLAVICLE RESECTION, SUBACROMIAL DECOMPRESSION.;  Surgeon: Jennifer Balding, MD;  Location: [facility];  Service: Orthopedics;  Laterality: Right;  . SHOULDER ARTHROSCOPY WITH ROTATOR CUFF REPAIR Right 09/25/2016   Procedure: Right Shoulder Manipulation, Arthroscopic Debridement of Joint, Removal of Loose Body and Re-Repair of Rotator Cuff;  Surgeon: Jennifer Balding, MD;  Location: [facility];  Service: Orthopedics;  Laterality: Right;   Social History   Occupational History  . Not on file  Tobacco Use  . Smoking status: Former Smoker    Types: Cigarettes  . Smokeless tobacco: Former Systems developer    Quit date: 01/12/1997  Substance and Sexual Activity  . Alcohol use: No    Alcohol/week: 0.0 standard drinks  . Drug use: No  . Sexual activity: Yes

## 2018-08-15 ENCOUNTER — Encounter (HOSPITAL_COMMUNITY): Payer: 59

## 2018-08-15 ENCOUNTER — Ambulatory Visit (HOSPITAL_COMMUNITY)
Admission: RE | Admit: 2018-08-15 | Discharge: 2018-08-15 | Disposition: A | Discharge status: Home / Self Care | Payer: 59 | Source: Ambulatory Visit | Attending: Obstetrics and Gynecology | Admitting: Obstetrics and Gynecology

## 2018-08-15 DIAGNOSIS — Z1231 Encounter for screening mammogram for malignant neoplasm of breast: Secondary | ICD-10-CM | POA: Diagnosis not present

## 2018-08-21 ENCOUNTER — Ambulatory Visit (INDEPENDENT_AMBULATORY_CARE_PROVIDER_SITE_OTHER): Payer: 59 | Admitting: Otolaryngology

## 2018-08-21 DIAGNOSIS — J342 Deviated nasal septum: Secondary | ICD-10-CM

## 2018-08-21 DIAGNOSIS — J343 Hypertrophy of nasal turbinates: Secondary | ICD-10-CM | POA: Diagnosis not present

## 2018-08-21 DIAGNOSIS — J310 Chronic rhinitis: Secondary | ICD-10-CM

## 2018-08-21 DIAGNOSIS — R51 Headache: Secondary | ICD-10-CM | POA: Diagnosis not present

## 2018-08-22 DIAGNOSIS — H43813 Vitreous degeneration, bilateral: Secondary | ICD-10-CM | POA: Diagnosis not present

## 2018-08-22 DIAGNOSIS — H524 Presbyopia: Secondary | ICD-10-CM | POA: Diagnosis not present

## 2018-08-23 ENCOUNTER — Ambulatory Visit
Admission: RE | Admit: 2018-08-23 | Discharge: 2018-08-23 | Disposition: A | Discharge status: Home / Self Care | Payer: 59 | Source: Ambulatory Visit | Attending: Orthopaedic Surgery | Admitting: Orthopaedic Surgery

## 2018-08-23 DIAGNOSIS — M75112 Incomplete rotator cuff tear or rupture of left shoulder, not specified as traumatic: Secondary | ICD-10-CM | POA: Diagnosis not present

## 2018-08-23 DIAGNOSIS — G8929 Other chronic pain: Secondary | ICD-10-CM

## 2018-08-23 DIAGNOSIS — M25512 Pain in left shoulder: Principal | ICD-10-CM

## 2018-08-27 ENCOUNTER — Ambulatory Visit (INDEPENDENT_AMBULATORY_CARE_PROVIDER_SITE_OTHER): Payer: 59 | Admitting: Orthopaedic Surgery

## 2018-08-27 ENCOUNTER — Encounter (INDEPENDENT_AMBULATORY_CARE_PROVIDER_SITE_OTHER): Payer: Self-pay | Admitting: Orthopaedic Surgery

## 2018-08-27 VITALS — BP 120/70 | HR 71 | Ht 60.0 in | Wt 210.0 lb

## 2018-08-27 DIAGNOSIS — G8929 Other chronic pain: Secondary | ICD-10-CM | POA: Diagnosis not present

## 2018-08-27 DIAGNOSIS — M25512 Pain in left shoulder: Secondary | ICD-10-CM | POA: Diagnosis not present

## 2018-08-27 NOTE — Progress Notes (Signed)
Office Visit Note   Patient: Jennifer Coleman           Date of Birth: 07/18/1951           MRN: [id number] Visit Date: 08/27/2018              Requested by: Mikey Kirschner, [place], [place] [address] PCP: Mikey Kirschner, MD   Assessment & Plan: Visit Diagnoses:  1. Chronic left shoulder pain     Plan: MRI scan of left shoulder demonstrates a full-thickness retracted supraspinatus tendon tear with maximum retraction of 15 mm.  The infraspinatus and subscapularis tendons are intact.  Muscles are normal.  Mild tendinopathy of the biceps long head.  Moderate to advanced degenerative changes with joint effusion at the acromioclavicular joint.  Mild degenerative changes in the glenohumeral joint.  Long discussion with Merridy ring the findings.  I would suggest an arthroscopic SCD, DCR and mini open rotator cuff tear repair.  I discussed this over 30 minutes with her.  She has had prior rotator cuff tear repair on the right shoulder and still has some issues but certainly is better than she was and is certainly functional.  She needs to think about all the above and let me know what she would like to do  Follow-Up Instructions: Return if symptoms worsen or fail to improve.   Orders:  No orders of the defined types were placed in this encounter.  No orders of the defined types were placed in this encounter.     Procedures: No procedures performed   Clinical Data: No additional findings.   Subjective: Chief Complaint  Patient presents with  . Follow-up    MRI FOLLOW UP    HPI  Review of Systems  Constitutional: Positive for fatigue. Negative for fever.  HENT: Negative for ear pain.   Eyes: Negative for pain.  Respiratory: Negative for cough and shortness of breath.   Cardiovascular: Positive for leg swelling.  Gastrointestinal: Negative for constipation and diarrhea.  Genitourinary: Negative for difficulty urinating.  Musculoskeletal: Negative  for back pain and neck pain.  Skin: Negative for rash.  Allergic/Immunologic: Positive for food allergies.  Neurological: Positive for weakness. Negative for numbness.  Hematological: Bruises/bleeds easily.  Psychiatric/Behavioral: Negative for sleep disturbance.     Objective: Vital Signs: BP 120/70 (BP Location: Left Arm, Patient Position: Sitting, Cuff Size: Normal)   Pulse 71   Ht 5' (1.524 m)   Wt 210 lb (95.3 kg)   BMI 41.01 kg/m   Physical Exam  Constitutional: She is oriented to person, place, and time. She appears well-developed and well-nourished.  HENT:  Mouth/Throat: Oropharynx is clear and moist.  Eyes: Pupils are equal, round, and reactive to light. EOM are normal.  Pulmonary/Chest: Effort normal.  Neurological: She is alert and oriented to person, place, and time.  Skin: Skin is warm and dry.  Psychiatric: She has a normal mood and affect. Her behavior is normal.    Ortho Exam awake alert and oriented x3.  Comfortable sitting.  Positive impingement and empty can testing left shoulder.  Able to place her arm overhead but she is uncomfortable.  Seems to have good strength with internal/external rotation  Specialty Comments:  No specialty comments available.  Imaging: No results found.   PMFS History: Patient Active Problem List   Diagnosis Date Noted  . Sun-damaged skin 01/03/2018  . Morbid obesity ([place]) 11/05/2017  . Depression with anxiety 11/05/2017  .  Nontraumatic incomplete tear of right rotator cuff 09/25/2016  . Foreign body of shoulder right  09/25/2016  . 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 Shoulder joint painful on movement, right 08/29/2016  . Epigastric pain 08/11/2015  . Chest pain, rule out acute myocardial infarction 08/11/2015  . Renal insufficiency 08/11/2015  . Venous stasis 04/13/2015  . Depression 04/13/2015  . Essential hypertension 09/12/2014  . PVCs (premature ventricular contractions) 09/07/2014  . Precordial  pain 09/07/2014   Past Medical History:  Diagnosis Date  . Anxiety   . Arthritis   . Family history of anesthesia complication 25 yrs ago   Sister allergic to sccinocholine - unable to come off ventilator  . Hypertension   . Pneumonia   . PONV (postoperative nausea and vomiting)   . Premature ventricular contractions   . Shingles   . Varicose veins   . Varicose veins of left lower extremity     Family History  Problem Relation Age of Onset  . Heart disease Sister   . Heart disease Brother        7's  . Diabetes Mellitus II Sister   . Multiple myeloma Mother   . Hypertension Sister   . Hypertension Father   . Heart attack Father     Past Surgical History:  Procedure Laterality Date  . CARPAL TUNNEL RELEASE    . CERVICAL DISC SURGERY  1992   Fusion  . COLONOSCOPY N/A 06/07/2016   Procedure: COLONOSCOPY;  Surgeon: Rogene Houston, MD;  [facility];  Service: Endoscopy;  Laterality: N/A;  1:00  . KNEE ARTHROSCOPY  09/16/2012   Procedure: ARTHROSCOPY KNEE;  Surgeon: [name], MD;  Location: [facility];  Service: Orthopedics;  Laterality: Right;  Right Knee Arthroscopy  . SALPINGOOPHORECTOMY Left 1985  . SHOULDER ARTHROSCOPY WITH OPEN ROTATOR CUFF REPAIR AND DISTAL CLAVICLE ACROMINECTOMY Right 05/03/2016   Procedure: RIGHT SHOULDER ARTHROSCOPY WITH MINI-OPEN ROTATOR CUFF REPAIR,DISTAL CLAVICLE RESECTION, SUBACROMIAL DECOMPRESSION.;  Surgeon: [name], MD;  Location: [facility];  Service: Orthopedics;  Laterality: Right;  . SHOULDER ARTHROSCOPY WITH ROTATOR CUFF REPAIR Right 09/25/2016   Procedure: Right Shoulder Manipulation, Arthroscopic Debridement of Joint, Removal of Loose Body and Re-Repair of Rotator Cuff;  Surgeon: [name], MD;  Location: [facility];  Service: Orthopedics;  Laterality: Right;   Social History   Occupational History  . Not on file  Tobacco Use  . Smoking status: Former Smoker    Types: Cigarettes  . Smokeless  tobacco: Never Used  Substance and Sexual Activity  . Alcohol use: No    Alcohol/week: 0.0 standard drinks  . Drug use: No  . Sexual activity: Yes

## 2018-09-08 ENCOUNTER — Telehealth (INDEPENDENT_AMBULATORY_CARE_PROVIDER_SITE_OTHER): Payer: Self-pay | Admitting: Orthopaedic Surgery

## 2018-09-08 NOTE — Telephone Encounter (Signed)
Patient called checking on the status of her surgery.  Patient requested a return call.

## 2018-09-09 ENCOUNTER — Telehealth (INDEPENDENT_AMBULATORY_CARE_PROVIDER_SITE_OTHER): Payer: Self-pay | Admitting: Orthopaedic Surgery

## 2018-09-09 NOTE — Telephone Encounter (Signed)
Please advise. surgery sheet placed on desk to fill out

## 2018-09-09 NOTE — Telephone Encounter (Signed)
Patient called and would like to proceed with L shoulder surgery. 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  She is interested in the date of Dec 3rd (Tuesday) for the surgery.  Please provide surgery sheet.

## 2018-09-10 NOTE — Telephone Encounter (Signed)
Completed sheet for surgery

## 2018-09-11 ENCOUNTER — Other Ambulatory Visit: Payer: Self-pay | Admitting: *Deleted

## 2018-09-11 MED ORDER — ESCITALOPRAM OXALATE 20 MG PO TABS: 20.0000 mg | ORAL_TABLET | Freq: Every day | ORAL | 1 refills | Status: DC

## 2018-09-12 MED FILL — ESCITALOPRAM 20 MG TABLET: 20 | 90 days supply | Qty: 90 | Fill #0

## 2018-09-15 DIAGNOSIS — M79675 Pain in left toe(s): Secondary | ICD-10-CM | POA: Diagnosis not present

## 2018-09-15 DIAGNOSIS — M7740 Metatarsalgia, unspecified foot: Secondary | ICD-10-CM | POA: Diagnosis not present

## 2018-09-15 DIAGNOSIS — M7752 Other enthesopathy of left foot: Secondary | ICD-10-CM | POA: Diagnosis not present

## 2018-09-16 NOTE — H&P (Addendum)
Joni Fears, MD   Biagio Borg, PA-C [address] [place], [place]  [address]                             [phone]   [place] MRN:  [id number] DOB/SEX:  [birth year]/female  CHIEF COMPLAINT:  Painful left Shoulder  HISTORY: Ms. Jennifer Coleman is a very pleasant 67 year old female who has been seen in the past for an injury to her left shoulder.  Apparently on June 21, 2018 she slipped and fell on a wet floor.  She landed on outstretched left arm and developed pain in the shoulder as well as pain into the cervical spine and elbow and wrist.  She had difficulty initially with reaching overhead or behind her back.  She did have some improvement after her initial visit and was able to place her left arm overhead and able to reach her back.  Did have some discomfort with motion of her shoulder.  There is no bruising or neuro vascular compromise.      She developed recurrence of her pain and discomfort and was seen on 08/13/2018 with left shoulder pain which was very similar to the pain in her right shoulder that she had from a rotator cuff tear.  Therefore an MRI scan was ordered.  MRI scan of left shoulder demonstrated a full-thickness retracted supraspinatus tendon tear with maximum retraction of 15 mm.  The infraspinatus and subscapularis tendons are intact.  Muscles are normal.  Mild tendinopathy of the biceps long head.  Moderate to advanced degenerative changes with joint effusion at the acromioclavicular joint.  Mild degenerative changes in the glenohumeral joint.    She is now indicated for surgical intervention.     PAST MEDICAL HISTORY: Patient Active Problem List   Diagnosis Date Noted  . Sun-damaged skin 01/03/2018  . Morbid obesity ([place]) 11/05/2017  . Depression with anxiety 11/05/2017  . Nontraumatic incomplete tear of right rotator cuff 09/25/2016  . Foreign body of shoulder right  09/25/2016  . 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 Shoulder joint painful on movement, right 08/29/2016  . Epigastric pain 08/11/2015  . Chest pain, rule out acute myocardial infarction 08/11/2015  . Renal insufficiency 08/11/2015  . Venous stasis 04/13/2015  . Depression 04/13/2015  . Essential hypertension 09/12/2014  . PVCs (premature ventricular contractions) 09/07/2014  . Precordial pain 09/07/2014   Past Medical History:  Diagnosis Date  . Anxiety   . Arthritis   . Dyspnea   . Dysrhythmia   . Family history of anesthesia complication 25 yrs ago   Sister allergic to sccinocholine - unable to come off ventilator  . Hypertension   . Pneumonia   . PONV (postoperative nausea and vomiting)   . Premature ventricular contractions   . Shingles   . Varicose veins   . Varicose veins of left lower extremity    Past Surgical History:  Procedure Laterality Date  . CARPAL TUNNEL RELEASE    . CERVICAL DISC SURGERY  1992   Fusion  . COLONOSCOPY N/A 06/07/2016   Procedure: COLONOSCOPY;  Surgeon: Rogene Houston, MD;  [facility];  Service: Endoscopy;  Laterality: N/A;  1:00  . KNEE ARTHROSCOPY  09/16/2012   Procedure: ARTHROSCOPY KNEE;  Surgeon: [name], MD;  Location: [facility];  Service: Orthopedics;  Laterality: Right;  Right Knee Arthroscopy  . SALPINGOOPHORECTOMY Left 1985  .  SHOULDER ARTHROSCOPY WITH OPEN ROTATOR CUFF REPAIR AND DISTAL CLAVICLE ACROMINECTOMY Right 05/03/2016   Procedure: RIGHT SHOULDER ARTHROSCOPY WITH MINI-OPEN ROTATOR CUFF REPAIR,DISTAL CLAVICLE RESECTION, SUBACROMIAL DECOMPRESSION.;  Surgeon: [name], MD;  Location: [facility];  Service: Orthopedics;  Laterality: Right;  . SHOULDER ARTHROSCOPY WITH ROTATOR CUFF REPAIR Right 09/25/2016   Procedure: Right Shoulder Manipulation, Arthroscopic Debridement of Joint, Removal of Loose Body and Re-Repair of Rotator Cuff;  Surgeon: [name], MD;  Location: [facility];  Service: Orthopedics;  Laterality:  Right;     MEDICATIONS:   Medications Prior to Admission  Medication Sig Dispense Refill Last Dose  . Calcium Carbonate-Vit D-Min (GNP CALCIUM PLUS 600 +D PO) Take 1 tablet by mouth daily.    09/26/2018  . cetirizine (ZYRTEC) 10 MG tablet Take 10 mg by mouth daily as needed for allergies.    09/29/2018 at Unknown time  . enalapril (VASOTEC) 20 MG tablet Take 1 tablet (20 mg total) by mouth 2 (two) times daily. 180 tablet 1 09/30/2018 at Unknown time  . escitalopram (LEXAPRO) 20 MG tablet Take 1 tablet (20 mg total) by mouth daily. 90 tablet 1 09/29/2018 at Unknown time  . fluticasone (FLONASE) 50 MCG/ACT nasal spray Place 2 sprays into both nostrils daily. (Patient taking differently: Place 1 spray into both nostrils daily as needed for allergies. ) 16 g 11 Past Week at Unknown time  . ibuprofen (ADVIL,MOTRIN) 200 MG tablet Take 400-800 mg by mouth every 6 (six) hours as needed for headache or moderate pain.   09/26/2018  . Multiple Vitamins-Minerals (ONE-A-DAY WOMENS 50 PLUS PO) Take 1 tablet by mouth daily.   09/26/2018  . PROAIR HFA 108 (90 Base) MCG/ACT inhaler INHALE TWO PUFFS BY MOUTH EVERY 4 TO 6 HOURS AS NEEDED FOR FOR WHEEZING (Patient taking differently: Inhale 2 puffs into the lungs every 4 (four) hours as needed for wheezing or shortness of breath. ) 1 Inhaler 0 Past Month at Unknown time  . gabapentin (NEURONTIN) 100 MG capsule Take 1 capsule (100 mg total) by mouth at bedtime. 30 capsule 5 Taking  . hydrochlorothiazide (HYDRODIURIL) 25 MG tablet Take 1 tablet (25 mg total) by mouth daily. (Patient not taking: Reported on 09/16/2018) 90 tablet 1 Not Taking at Unknown time   Current Facility-Administered Medications  Medication Dose Route Frequency Provider Last Rate Last Dose  . 0.9 %  sodium chloride infusion  75 mL/hr Intravenous Continuous Cherylann Ratel, PA-C 75 mL/hr at 09/30/18 0856 75 mL/hr at 09/30/18 0856  . chlorhexidine (HIBICLENS) 4 % liquid 4 application  60 mL Topical  Once Sharbel Sahagun D, PA-C      . chlorhexidine (HIBICLENS) 4 % liquid 4 application  60 mL Topical Once Markell Sciascia D, PA-C      . vancomycin (VANCOCIN) 1,500 mg in sodium chloride 0.9 % 500 mL IVPB  1,500 mg Intravenous To SS-Surg [name], MD 250 mL/hr at 09/30/18 0857 1,500 mg at 09/30/18 0857   Facility-Administered Medications Ordered in Other Encounters  Medication Dose Route Frequency Provider Last Rate Last Dose  . bupivacaine liposome (EXPAREL) 1.3 % injection    Anesthesia Intra-op Brennan Bailey, MD   10 mL at 09/30/18 0915  . bupivacaine-epinephrine (MARCAINE W/ EPI) 0.5% -1:200000 injection    Anesthesia Intra-op Brennan Bailey, MD   10 mL at 09/30/18 0913  . lactated ringers infusion    Continuous PRN Jenne Campus, CRNA  ALLERGIES:   Allergies  Allergen Reactions  . Penicillins Shortness Of Breath, Swelling, Rash and Other (See Comments)    Patient tolerates Cephalosporins   . Shellfish Allergy Shortness Of Breath and Swelling  . Acyclovir And Related Other (See Comments)    UNSPECIFIED REACTION   . Codeine Itching  . Morphine And Related Nausea And Vomiting    REVIEW OF SYSTEMS:  Constitutional: Positive for fatigue. Negative for fever.  HENT: Negative for ear pain.   Eyes: Negative for pain.  Respiratory: Negative for cough and shortness of breath.   Cardiovascular: Positive for leg swelling.  Gastrointestinal: Negative for constipation and diarrhea.  Genitourinary: Negative for difficulty urinating.  Musculoskeletal: Negative for back pain and neck pain.  Skin: Negative for rash.  Allergic/Immunologic: Positive for food allergies.  Neurological: Positive for weakness. Negative for numbness.  Hematological: Bruises/bleeds easily.  Psychiatric/Behavioral: Negative for sleep disturbance.     FAMILY HISTORY:   Family History  Problem Relation Age of Onset  . Heart disease Sister   . Heart disease Brother        65's  .  Diabetes Mellitus II Sister   . Multiple myeloma Mother   . Hypertension Sister   . Hypertension Father   . Heart attack Father     SOCIAL HISTORY:   Social History   Tobacco Use  . Smoking status: Former Smoker    Types: Cigarettes    Last attempt to quit: 09/22/1992    Years since quitting: 26.0  . Smokeless tobacco: Never Used  Substance Use Topics  . Alcohol use: No    Alcohol/week: 0.0 standard drinks      EXAMINATION: Vital signs in last 24 hours: Temp:  [97.6 F (36.4 C)] 97.6 F (36.4 C) (12/03 0828) Pulse Rate:  [57-70] 66 (12/03 0945) Resp:  [14-23] 23 (12/03 0945) BP: (114-161)/(50-76) 124/50 (12/03 0940) SpO2:  [98 %-100 %] 99 % (12/03 0945)  Head is normocephalic.   Eyes:  Pupils equal, round and reactive to light and accommodation.  Extraocular intact. ENT: Ears, nose, and throat were benign.   Neck: supple, no bruits were noted.   Chest: good expansion.   Lungs: essentially clear.   Cardiac: regular rhythm and rate, normal S1, S2.  No murmurs appreciated. Pulses :  2+ bilateral and symmetric in upper extremities. Abdomen is scaphoid, soft, nontender, no masses palpable, normal bowel sounds                  present. CNS:  He is oriented x3 and cranial nerves II-XII grossly intact. Breast, rectal, and genital exams: not performed and not indicated for an orthopedic evaluation. Musculoskeletal: Positive impingement and empty can testing left shoulder.  Able to place her arm overhead but she is uncomfortable.  Seems to have good strength with internal/external rotation   Imaging Review MRI scan of left shoulder demonstrates a full-thickness retracted supraspinatus tendon tear with maximum retraction of 15 mm.  The infraspinatus and subscapularis tendons are intact.  Muscles are normal.  Mild tendinopathy of the biceps long head.  Moderate to advanced degenerative changes with joint effusion at the acromioclavicular joint.  Mild degenerative changes in the  glenohumeral joint.    ASSESSMENT: Left rotator cuff tear full-thickness retracted supraspinatus tendon with maximum retraction of 15 mm. AC joint arthritis  Past Medical History:  Diagnosis Date  . Anxiety   . Arthritis   . Dyspnea   . Dysrhythmia   . Family history of anesthesia complication 25  yrs ago   Sister allergic to sccinocholine - unable to come off ventilator  . Hypertension   . Pneumonia   . PONV (postoperative nausea and vomiting)   . Premature ventricular contractions   . Shingles   . Varicose veins   . Varicose veins of left lower extremity     PLAN: Plan for left arthroscopic subacromial decompression with distal clavicle resection and mini open rotator cuff repair  The procedure,  risks, and benefits of surgery were presented and reviewed. The risks including but not limited to infection, blood clots, vascular and nerve injury, stiffness,  among others were discussed. The patient acknowledged the explanation, agreed to proceed.   Mike Craze [place], Index [phone]   09/30/2018 9:52 AM

## 2018-09-22 ENCOUNTER — Other Ambulatory Visit: Payer: Self-pay

## 2018-09-22 ENCOUNTER — Encounter (HOSPITAL_COMMUNITY)
Admission: RE | Admit: 2018-09-22 | Discharge: 2018-09-22 | Disposition: A | Discharge status: Home / Self Care | Payer: 59 | Source: Ambulatory Visit | Attending: Orthopaedic Surgery | Admitting: Orthopaedic Surgery

## 2018-09-22 ENCOUNTER — Encounter (HOSPITAL_COMMUNITY): Payer: Self-pay

## 2018-09-22 DIAGNOSIS — M25512 Pain in left shoulder: Secondary | ICD-10-CM | POA: Diagnosis not present

## 2018-09-22 DIAGNOSIS — G8929 Other chronic pain: Secondary | ICD-10-CM | POA: Insufficient documentation

## 2018-09-22 DIAGNOSIS — Z01818 Encounter for other preprocedural examination: Secondary | ICD-10-CM | POA: Diagnosis not present

## 2018-09-22 HISTORY — DX: Dyspnea, unspecified: R06.00

## 2018-09-22 LAB — COMPREHENSIVE METABOLIC PANEL
ALT: 17 U/L (ref 0–44)
ANION GAP: 5 (ref 5–15)
AST: 22 U/L (ref 15–41)
Albumin: 3.8 g/dL (ref 3.5–5.0)
Alkaline Phosphatase: 84 U/L (ref 38–126)
BUN: 22 mg/dL (ref 8–23)
CHLORIDE: 105 mmol/L (ref 98–111)
CO2: 30 mmol/L (ref 22–32)
Calcium: 10.4 mg/dL — ABNORMAL HIGH (ref 8.9–10.3)
Creatinine, Ser: 1 mg/dL (ref 0.44–1.00)
GFR calc Af Amer: >60 mL/min (ref >60)
GFR calc non Af Amer: 58 mL/min — ABNORMAL LOW (ref >60)
GLUCOSE: 88 mg/dL (ref 70–99)
POTASSIUM: 3.9 mmol/L (ref 3.5–5.1)
Sodium: 140 mmol/L (ref 135–145)
TOTAL PROTEIN: 7.4 g/dL (ref 6.5–8.1)
Total Bilirubin: 0.6 mg/dL (ref 0.3–1.2)

## 2018-09-22 LAB — URINALYSIS, ROUTINE W REFLEX MICROSCOPIC
BILIRUBIN URINE: NEGATIVE
Bacteria, UA: NONE SEEN
Glucose, UA: NEGATIVE mg/dL
HGB URINE DIPSTICK: NEGATIVE
Ketones, ur: NEGATIVE mg/dL
Leukocytes, UA: NEGATIVE
NITRITE: NEGATIVE
Protein, ur: NEGATIVE mg/dL
SPECIFIC GRAVITY, URINE: 1.016 (ref 1.005–1.030)
pH: 6 (ref 5.0–8.0)

## 2018-09-22 LAB — CBC WITH DIFFERENTIAL/PLATELET
Abs Immature Granulocytes: 0.01 10*3/uL (ref 0.00–0.07)
BASOS PCT: 1 %
Basophils Absolute: 0 10*3/uL (ref 0.0–0.1)
EOS ABS: 0.2 10*3/uL (ref 0.0–0.5)
EOS PCT: 3 %
HCT: 43.9 % (ref 36.0–46.0)
Hemoglobin: 13.9 g/dL (ref 12.0–15.0)
IMMATURE GRANULOCYTES: 0 %
Lymphocytes Relative: 37 %
Lymphs Abs: 2.4 10*3/uL (ref 0.7–4.0)
MCH: 31 pg (ref 26.0–34.0)
MCHC: 31.7 g/dL (ref 30.0–36.0)
MCV: 98 fL (ref 80.0–100.0)
MONOS PCT: 9 %
Monocytes Absolute: 0.6 10*3/uL (ref 0.1–1.0)
NEUTROS PCT: 50 %
Neutro Abs: 3.3 10*3/uL (ref 1.7–7.7)
PLATELETS: 232 10*3/uL (ref 150–400)
RBC: 4.48 MIL/uL (ref 3.87–5.11)
RDW: 12.2 % (ref 11.5–15.5)
WBC: 6.4 10*3/uL (ref 4.0–10.5)
nRBC: 0 % (ref 0.0–0.2)

## 2018-09-22 LAB — APTT: APTT: 35 s (ref 24–36)

## 2018-09-22 LAB — PROTIME-INR
INR: 1.01
Prothrombin Time: 13.2 seconds (ref 11.4–15.2)

## 2018-09-22 NOTE — Pre-Procedure Instructions (Signed)
Jennifer Coleman  09/22/2018      [place], [place] - [address] [facility] [address] [place] [address] Phone: [phone] Fax: [phone]    Your procedure is scheduled on Tuesday, Dec. 3rd   Report to [facility] Admitting at [place] am             (posted surgery time 9:45a - 11:46a)   Call this number if you have problems the morning of surgery:  [phone]   Remember:   Do not eat any foods or drink any liquids after midnight, Monday.              7 days prior to surgery, STOP TAKING any Vitamins, Herbal Supplements, Anti-inflammatories.    Take these medicines the morning of surgery with A SIP OF WATER : Escitalopram. Please use your inhaler(s) that morning.    Do not wear jewelry, make-up or nail polish.  Do not wear lotions, powders, perfumes, or deodorant.  Do not shave 48 hours prior to surgery.    Do not bring valuables to the hospital.  [facility] is not responsible for any belongings or valuables.  Contacts, dentures or bridgework may not be worn into surgery.  Leave your suitcase in the car.  After surgery it may be brought to your room.  For patients admitted to the hospital, discharge time will be determined by your treatment team.  Please read over the following fact sheets that you were given. Pain Booklet, MRSA Information and Surgical Site Infection Prevention       [facility]- Preparing For Surgery  Before surgery, you can play an important role. Because skin is not sterile, your skin needs to be as free of germs as possible. You can reduce the number of germs on your skin by washing with CHG (chlorahexidine gluconate) Soap before surgery.  CHG is an antiseptic cleaner which kills germs and bonds with the skin to continue killing germs even after washing.    Oral Hygiene is also important to reduce your risk of infection.    Remember - BRUSH YOUR TEETH THE MORNING OF SURGERY WITH YOUR  REGULAR TOOTHPASTE  Please do not use if you have an allergy to CHG or antibacterial soaps. If your skin becomes reddened/irritated stop using the CHG.  Do not shave (including legs and underarms) for at least 48 hours prior to first CHG shower. It is OK to shave your face.  Please follow these instructions carefully.   1. Shower the NIGHT BEFORE SURGERY and the MORNING OF SURGERY with CHG.   2. If you chose to wash your hair, wash your hair first as usual with your normal shampoo.  3. After you shampoo, rinse your hair and body thoroughly to remove the shampoo.  4. Use CHG as you would any other liquid soap. You can apply CHG directly to the skin and wash gently with a scrungie or a clean washcloth.   5. Apply the CHG Soap to your body ONLY FROM THE NECK DOWN.  Do not use on open wounds or open sores. Avoid contact with your eyes, ears, mouth and genitals (private parts). Wash Face and genitals (private parts)  with your normal soap.  6. Wash thoroughly, paying special attention to the area where your surgery will be performed.  7. Thoroughly rinse your body with warm water from the neck down.  8. DO NOT shower/wash with your normal soap after using and  rinsing off the CHG Soap.  9. Pat yourself dry with a CLEAN TOWEL.  10. Wear CLEAN PAJAMAS to bed the night before surgery, wear comfortable clothes the morning of surgery  11. Place CLEAN SHEETS on your bed the night of your first shower and DO NOT SLEEP WITH PETS.  Day of Surgery:  Do not apply any deodorants/lotions.  Please wear clean clothes to the hospital/surgery center.   Remember to brush your teeth WITH YOUR REGULAR TOOTHPASTE.

## 2018-09-22 NOTE — Progress Notes (Signed)
PCP is Dr. Lance Sell.  LOV  04/2018 Currently denies murmur, cp, sob. Did see Dr. Bronson Ing in 08/2015 for sob & "pulse in neck", was having PVC's.   Echo 2016 Stress 2015

## 2018-09-29 MED ORDER — VANCOMYCIN HCL 10 G IV SOLR
1500.0000 mg | INTRAVENOUS | Status: AC
  Administered 2018-09-30 (×2): 1500 mg via INTRAVENOUS
  Filled 2018-09-29: qty 1500

## 2018-09-29 NOTE — Anesthesia Preprocedure Evaluation (Addendum)
Anesthesia Evaluation  Patient identified by MRN, date of birth, ID band Patient awake    Reviewed: Allergy & Precautions, NPO status , Patient's Chart, lab work & pertinent test results  History of Anesthesia Complications (+) PONV, Family history of anesthesia reaction and history of anesthetic complications  Airway Mallampati: II  TM Distance: >3 FB Neck ROM: Full    Dental  (+) Teeth Intact, Dental Advisory Given   Pulmonary former smoker,    Pulmonary exam normal breath sounds clear to auscultation       Cardiovascular hypertension, Pt. on medications Normal cardiovascular exam Rhythm:Regular Rate:Normal     Neuro/Psych Anxiety Depression negative neurological ROS     GI/Hepatic negative GI ROS, Neg liver ROS,   Endo/Other  Morbid obesity  Renal/GU negative Renal ROS     Musculoskeletal  (+) Arthritis , Osteoarthritis,    Abdominal   Peds  Hematology negative hematology ROS (+)   Anesthesia Other Findings Day of surgery medications reviewed with the patient.  Reproductive/Obstetrics                           Anesthesia Physical Anesthesia Plan  ASA: III  Anesthesia Plan: General   Post-op Pain Management: GA combined w/ Regional for post-op pain   Induction: Intravenous  PONV Risk Score and Plan: 4 or greater and Treatment may vary due to age or medical condition, Ondansetron, Dexamethasone, Midazolam and Scopolamine patch - Pre-op  Airway Management Planned: Oral ETT  Additional Equipment:   Intra-op Plan:   Post-operative Plan: Extubation in OR  Informed Consent: I have reviewed the patients History and Physical, chart, labs and discussed the procedure including the risks, benefits and alternatives for the proposed anesthesia with the patient or authorized representative who has indicated his/her understanding and acceptance.   Dental advisory given  Plan Discussed  with: CRNA  Anesthesia Plan Comments:        Anesthesia Quick Evaluation

## 2018-09-30 ENCOUNTER — Ambulatory Visit (HOSPITAL_COMMUNITY)
Admission: RE | Admit: 2018-09-30 | Discharge: 2018-09-30 | Disposition: A | Discharge status: Home / Self Care | Payer: 59 | Source: Ambulatory Visit | Attending: Orthopaedic Surgery | Admitting: Orthopaedic Surgery

## 2018-09-30 ENCOUNTER — Ambulatory Visit (HOSPITAL_COMMUNITY): Payer: 59 | Admitting: Anesthesiology

## 2018-09-30 ENCOUNTER — Encounter (HOSPITAL_COMMUNITY): Payer: Self-pay | Admitting: *Deleted

## 2018-09-30 ENCOUNTER — Encounter (HOSPITAL_COMMUNITY)
Admission: RE | Disposition: A | Discharge status: Home / Self Care | Payer: Self-pay | Source: Ambulatory Visit | Attending: Orthopaedic Surgery

## 2018-09-30 DIAGNOSIS — W010XXA Fall on same level from slipping, tripping and stumbling without subsequent striking against object, initial encounter: Secondary | ICD-10-CM | POA: Diagnosis not present

## 2018-09-30 DIAGNOSIS — Z881 Allergy status to other antibiotic agents status: Secondary | ICD-10-CM | POA: Diagnosis not present

## 2018-09-30 DIAGNOSIS — M19011 Primary osteoarthritis, right shoulder: Secondary | ICD-10-CM | POA: Insufficient documentation

## 2018-09-30 DIAGNOSIS — M19019 Primary osteoarthritis, unspecified shoulder: Secondary | ICD-10-CM | POA: Diagnosis present

## 2018-09-30 DIAGNOSIS — Z981 Arthrodesis status: Secondary | ICD-10-CM | POA: Insufficient documentation

## 2018-09-30 DIAGNOSIS — M19012 Primary osteoarthritis, left shoulder: Secondary | ICD-10-CM | POA: Diagnosis not present

## 2018-09-30 DIAGNOSIS — I493 Ventricular premature depolarization: Secondary | ICD-10-CM | POA: Diagnosis not present

## 2018-09-30 DIAGNOSIS — Z833 Family history of diabetes mellitus: Secondary | ICD-10-CM | POA: Insufficient documentation

## 2018-09-30 DIAGNOSIS — Z79899 Other long term (current) drug therapy: Secondary | ICD-10-CM | POA: Insufficient documentation

## 2018-09-30 DIAGNOSIS — M75102 Unspecified rotator cuff tear or rupture of left shoulder, not specified as traumatic: Secondary | ICD-10-CM | POA: Diagnosis not present

## 2018-09-30 DIAGNOSIS — Z6841 Body Mass Index (BMI) 40.0 and over, adult: Secondary | ICD-10-CM | POA: Diagnosis not present

## 2018-09-30 DIAGNOSIS — G8918 Other acute postprocedural pain: Secondary | ICD-10-CM | POA: Diagnosis not present

## 2018-09-30 DIAGNOSIS — F329 Major depressive disorder, single episode, unspecified: Secondary | ICD-10-CM | POA: Insufficient documentation

## 2018-09-30 DIAGNOSIS — Z808 Family history of malignant neoplasm of other organs or systems: Secondary | ICD-10-CM | POA: Insufficient documentation

## 2018-09-30 DIAGNOSIS — Z87891 Personal history of nicotine dependence: Secondary | ICD-10-CM | POA: Diagnosis not present

## 2018-09-30 DIAGNOSIS — M75122 Complete rotator cuff tear or rupture of left shoulder, not specified as traumatic: Secondary | ICD-10-CM | POA: Diagnosis not present

## 2018-09-30 DIAGNOSIS — Z885 Allergy status to narcotic agent status: Secondary | ICD-10-CM | POA: Diagnosis not present

## 2018-09-30 DIAGNOSIS — M7542 Impingement syndrome of left shoulder: Secondary | ICD-10-CM | POA: Diagnosis present

## 2018-09-30 DIAGNOSIS — I10 Essential (primary) hypertension: Secondary | ICD-10-CM | POA: Diagnosis not present

## 2018-09-30 DIAGNOSIS — Z791 Long term (current) use of non-steroidal anti-inflammatories (NSAID): Secondary | ICD-10-CM | POA: Diagnosis not present

## 2018-09-30 DIAGNOSIS — M75101 Unspecified rotator cuff tear or rupture of right shoulder, not specified as traumatic: Secondary | ICD-10-CM | POA: Diagnosis present

## 2018-09-30 DIAGNOSIS — Z8249 Family history of ischemic heart disease and other diseases of the circulatory system: Secondary | ICD-10-CM | POA: Diagnosis not present

## 2018-09-30 DIAGNOSIS — F419 Anxiety disorder, unspecified: Secondary | ICD-10-CM | POA: Insufficient documentation

## 2018-09-30 SURGERY — SHOULDER ARTHROSCOPY WITH SUBACROMIAL DECOMPRESSION AND DISTAL CLAVICLE EXCISION
Anesthesia: General | Site: Shoulder | Laterality: Left

## 2018-09-30 MED ORDER — FENTANYL CITRATE (PF) 100 MCG/2ML IJ SOLN
50.0000 ug | Freq: Once | INTRAMUSCULAR | Status: AC
  Administered 2018-09-30: 50 ug via INTRAVENOUS

## 2018-09-30 MED ORDER — MIDAZOLAM HCL 2 MG/2ML IJ SOLN
INTRAMUSCULAR | Status: AC
  Filled 2018-09-30: qty 2

## 2018-09-30 MED ORDER — FENTANYL CITRATE (PF) 250 MCG/5ML IJ SOLN
INTRAMUSCULAR | Status: AC
  Filled 2018-09-30: qty 5

## 2018-09-30 MED ORDER — 0.9 % SODIUM CHLORIDE (POUR BTL) OPTIME
TOPICAL | Status: DC | PRN
  Administered 2018-09-30: 1000 mL

## 2018-09-30 MED ORDER — BUPIVACAINE LIPOSOME 1.3 % IJ SUSP
INTRAMUSCULAR | Status: DC | PRN
  Administered 2018-09-30: 10 mL via PERINEURAL

## 2018-09-30 MED ORDER — LACTATED RINGERS IV SOLN
INTRAVENOUS | Status: DC | PRN
  Administered 2018-09-30: 12:00:00 via INTRAVENOUS

## 2018-09-30 MED ORDER — DEXAMETHASONE SODIUM PHOSPHATE 10 MG/ML IJ SOLN
INTRAMUSCULAR | Status: AC
  Filled 2018-09-30: qty 1

## 2018-09-30 MED ORDER — ROCURONIUM BROMIDE 10 MG/ML (PF) SYRINGE
PREFILLED_SYRINGE | INTRAVENOUS | Status: DC | PRN
  Administered 2018-09-30: 50 mg via INTRAVENOUS
  Administered 2018-09-30: 20 mg via INTRAVENOUS

## 2018-09-30 MED ORDER — SODIUM CHLORIDE 0.9 % IR SOLN
Status: DC | PRN
  Administered 2018-09-30: 3000 mL
  Administered 2018-09-30: 9000 mL

## 2018-09-30 MED ORDER — ROCURONIUM BROMIDE 10 MG/ML (PF) SYRINGE: PREFILLED_SYRINGE | INTRAVENOUS | Status: DC | PRN

## 2018-09-30 MED ORDER — MIDAZOLAM HCL 2 MG/2ML IJ SOLN
INTRAMUSCULAR | Status: AC
  Administered 2018-09-30: 1 mg via INTRAVENOUS
  Filled 2018-09-30: qty 2

## 2018-09-30 MED ORDER — ACETAMINOPHEN 10 MG/ML IV SOLN
INTRAVENOUS | Status: AC
  Filled 2018-09-30: qty 100

## 2018-09-30 MED ORDER — PROPOFOL 10 MG/ML IV BOLUS
INTRAVENOUS | Status: AC
  Filled 2018-09-30: qty 20

## 2018-09-30 MED ORDER — SODIUM CHLORIDE 0.9 % IV SOLN
INTRAVENOUS | Status: DC | PRN
  Administered 2018-09-30: 25 ug/min via INTRAVENOUS

## 2018-09-30 MED ORDER — ONDANSETRON HCL 4 MG/2ML IJ SOLN
INTRAMUSCULAR | Status: AC
  Filled 2018-09-30: qty 2

## 2018-09-30 MED ORDER — BUPIVACAINE-EPINEPHRINE (PF) 0.5% -1:200000 IJ SOLN
INTRAMUSCULAR | Status: DC | PRN
  Administered 2018-09-30: 10 mL via PERINEURAL

## 2018-09-30 MED ORDER — SUGAMMADEX SODIUM 200 MG/2ML IV SOLN
INTRAVENOUS | Status: DC | PRN
  Administered 2018-09-30: 200 mg via INTRAVENOUS

## 2018-09-30 MED ORDER — ROCURONIUM BROMIDE 50 MG/5ML IV SOSY
PREFILLED_SYRINGE | INTRAVENOUS | Status: AC
  Filled 2018-09-30: qty 5

## 2018-09-30 MED ORDER — PROMETHAZINE HCL 25 MG/ML IJ SOLN: 6.2500 mg | INTRAMUSCULAR | Status: DC | PRN

## 2018-09-30 MED ORDER — PROPOFOL 10 MG/ML IV BOLUS
INTRAVENOUS | Status: DC | PRN
  Administered 2018-09-30: 150 mg via INTRAVENOUS

## 2018-09-30 MED ORDER — DEXAMETHASONE SODIUM PHOSPHATE 10 MG/ML IJ SOLN
INTRAMUSCULAR | Status: DC | PRN
  Administered 2018-09-30: 10 mg via INTRAVENOUS

## 2018-09-30 MED ORDER — LIDOCAINE 2% (20 MG/ML) 5 ML SYRINGE
INTRAMUSCULAR | Status: DC | PRN
  Administered 2018-09-30: 100 mg via INTRAVENOUS

## 2018-09-30 MED ORDER — CHLORHEXIDINE GLUCONATE 4 % EX LIQD: 60.0000 mL | Freq: Once | CUTANEOUS | Status: DC

## 2018-09-30 MED ORDER — OXYCODONE HCL 5 MG PO TABS: 5.0000 mg | ORAL_TABLET | ORAL | 0 refills | Status: DC | PRN

## 2018-09-30 MED ORDER — MIDAZOLAM HCL 2 MG/2ML IJ SOLN
1.0000 mg | Freq: Once | INTRAMUSCULAR | Status: AC
  Administered 2018-09-30: 1 mg via INTRAVENOUS

## 2018-09-30 MED ORDER — FENTANYL CITRATE (PF) 100 MCG/2ML IJ SOLN: 25.0000 ug | INTRAMUSCULAR | Status: DC | PRN

## 2018-09-30 MED ORDER — LACTATED RINGERS IV SOLN: INTRAVENOUS | Status: DC | PRN

## 2018-09-30 MED ORDER — SCOPOLAMINE 1 MG/3DAYS TD PT72
MEDICATED_PATCH | TRANSDERMAL | Status: AC
  Filled 2018-09-30: qty 1

## 2018-09-30 MED ORDER — SODIUM CHLORIDE 0.9 % IV SOLN
75.0000 mL/h | INTRAVENOUS | Status: DC
  Administered 2018-09-30: 75 mL/h via INTRAVENOUS

## 2018-09-30 MED ORDER — BUPIVACAINE HCL (PF) 0.25 % IJ SOLN
INTRAMUSCULAR | Status: AC
  Filled 2018-09-30: qty 30

## 2018-09-30 MED ORDER — ACETAMINOPHEN 10 MG/ML IV SOLN
1000.0000 mg | Freq: Once | INTRAVENOUS | Status: AC
  Administered 2018-09-30: 1000 mg via INTRAVENOUS
  Filled 2018-09-30: qty 100

## 2018-09-30 MED ORDER — LIDOCAINE-EPINEPHRINE 1 %-1:100000 IJ SOLN
INTRAMUSCULAR | Status: AC
  Filled 2018-09-30: qty 1

## 2018-09-30 MED ORDER — ONDANSETRON HCL 4 MG/2ML IJ SOLN
INTRAMUSCULAR | Status: DC | PRN
  Administered 2018-09-30: 4 mg via INTRAVENOUS

## 2018-09-30 MED ORDER — ACETAMINOPHEN 10 MG/ML IV SOLN: 1000.0000 mg | Freq: Once | INTRAVENOUS | Status: DC | PRN

## 2018-09-30 MED ORDER — FENTANYL CITRATE (PF) 100 MCG/2ML IJ SOLN
INTRAMUSCULAR | Status: DC | PRN
  Administered 2018-09-30: 100 ug via INTRAVENOUS

## 2018-09-30 MED ORDER — LIDOCAINE 2% (20 MG/ML) 5 ML SYRINGE
INTRAMUSCULAR | Status: AC
  Filled 2018-09-30: qty 5

## 2018-09-30 MED ORDER — FENTANYL CITRATE (PF) 100 MCG/2ML IJ SOLN
INTRAMUSCULAR | Status: AC
  Administered 2018-09-30: 50 ug via INTRAVENOUS
  Filled 2018-09-30: qty 2

## 2018-09-30 MED ORDER — LIDOCAINE-EPINEPHRINE (PF) 1 %-1:200000 IJ SOLN
INTRAMUSCULAR | Status: DC | PRN
  Administered 2018-09-30: 9 mL

## 2018-09-30 MED FILL — oxyCODONE HCL 5 MG TABS: 5 | 5 days supply | Qty: 30 | Fill #0

## 2018-09-30 SURGICAL SUPPLY — 67 items
AID PSTN UNV HD RSTRNT DISP (MISCELLANEOUS) ×1
ANCH SUT KNTLS SLF PNCH MTL (Anchor) IMPLANT
ANCHOR PEEK ALL THREAD (Anchor) IMPLANT
APL SKNCLS STERI-STRIP NONHPOA (GAUZE/BANDAGES/DRESSINGS) ×1
BENZOIN TINCTURE PRP APPL 2/3 (GAUZE/BANDAGES/DRESSINGS) ×2 IMPLANT
BLADE GREAT WHITE 4.2 (BLADE) IMPLANT
BLADE SURG 11 STRL SS (BLADE) ×2 IMPLANT
BUR OVAL 6.0 (BURR) ×2 IMPLANT
CANNULA ACUFLEX KIT 5X76 (CANNULA) ×2 IMPLANT
CLSR STERI-STRIP ANTIMIC 1/2X4 (GAUZE/BANDAGES/DRESSINGS) ×1 IMPLANT
COVER SURGICAL LIGHT HANDLE (MISCELLANEOUS) ×2 IMPLANT
COVER WAND RF STERILE (DRAPES) ×2 IMPLANT
DECANTER SPIKE VIAL GLASS SM (MISCELLANEOUS) ×2 IMPLANT
DRAPE SHOULDER BEACH CHAIR (DRAPES) ×2 IMPLANT
DRSG PAD ABDOMINAL 8X10 ST (GAUZE/BANDAGES/DRESSINGS) ×2 IMPLANT
DURAPREP 26ML APPLICATOR (WOUND CARE) ×2 IMPLANT
ELECT NDL TIP 2.8 STRL (NEEDLE) ×1 IMPLANT
ELECT NEEDLE TIP 2.8 STRL (NEEDLE) ×2 IMPLANT
ELECT REM PT RETURN 9FT ADLT (ELECTROSURGICAL) ×2
ELECTRODE REM PT RTRN 9FT ADLT (ELECTROSURGICAL) ×1 IMPLANT
GAUZE SPONGE 4X4 12PLY STRL (GAUZE/BANDAGES/DRESSINGS) ×2 IMPLANT
GLOVE BIOGEL PI IND STRL 8 (GLOVE) ×1 IMPLANT
GLOVE BIOGEL PI IND STRL 8.5 (GLOVE) ×1 IMPLANT
GLOVE BIOGEL PI INDICATOR 8 (GLOVE) ×1
GLOVE BIOGEL PI INDICATOR 8.5 (GLOVE) ×1
GLOVE ECLIPSE 8.0 STRL XLNG CF (GLOVE) ×3 IMPLANT
GLOVE ECLIPSE 8.5 STRL (GLOVE) ×4 IMPLANT
GOWN STRL REUS W/ TWL LRG LVL3 (GOWN DISPOSABLE) ×2 IMPLANT
GOWN STRL REUS W/TWL 2XL LVL3 (GOWN DISPOSABLE) ×2 IMPLANT
GOWN STRL REUS W/TWL LRG LVL3 (GOWN DISPOSABLE) ×4
IMPL ANCHOR PEEK 4.5MM (Anchor) IMPLANT
IMPLANT ANCHOR PEEK 4.5MM (Anchor) IMPLANT
KIT TURNOVER KIT B (KITS) ×2 IMPLANT
MANIFOLD NEPTUNE II (INSTRUMENTS) ×2 IMPLANT
NDL SPNL 18GX3.5 QUINCKE PK (NEEDLE) ×1 IMPLANT
NDL SUT 6 .5 CRC .975X.05 MAYO (NEEDLE) IMPLANT
NEEDLE 22X1 1/2 (OR ONLY) (NEEDLE) ×1 IMPLANT
NEEDLE MAYO TAPER (NEEDLE)
NEEDLE SPNL 18GX3.5 QUINCKE PK (NEEDLE) ×2 IMPLANT
NS IRRIG 1000ML POUR BTL (IV SOLUTION) ×2 IMPLANT
PACK SHOULDER (CUSTOM PROCEDURE TRAY) ×2 IMPLANT
PAD ABD 8X10 STRL (GAUZE/BANDAGES/DRESSINGS) ×1 IMPLANT
PAD ARMBOARD 7.5X6 YLW CONV (MISCELLANEOUS) ×4 IMPLANT
PROBE BIPOLAR ATHRO 135MM 90D (MISCELLANEOUS) ×2 IMPLANT
RESTRAINT HEAD UNIVERSAL NS (MISCELLANEOUS) ×1 IMPLANT
SET ARTHROSCOPY TUBING (MISCELLANEOUS) ×2
SET ARTHROSCOPY TUBING LN (MISCELLANEOUS) ×1 IMPLANT
SLING ARM IMMOBILIZER LRG (SOFTGOODS) ×1 IMPLANT
SLING ARM IMMOBILIZER MED (SOFTGOODS) IMPLANT
SPONGE LAP 4X18 RFD (DISPOSABLE) ×2 IMPLANT
STRIP CLOSURE SKIN 1/2X4 (GAUZE/BANDAGES/DRESSINGS) ×2 IMPLANT
SUCTION FRAZIER TIP 10 FR DISP (SUCTIONS) ×2 IMPLANT
SUT ETHIBOND 0 MO6 C/R (SUTURE) IMPLANT
SUT ETHIBOND 2 0 SH (SUTURE) IMPLANT
SUT ETHIBOND 2 OS 4 DA (SUTURE) ×2 IMPLANT
SUT ETHILON 4 0 PS 2 18 (SUTURE) ×1 IMPLANT
SUT MNCRL AB 3-0 PS2 18 (SUTURE) ×2 IMPLANT
SUT VIC AB 0 CT1 27 (SUTURE) ×4
SUT VIC AB 0 CT1 27XBRD ANBCTR (SUTURE) ×1 IMPLANT
SUT VIC AB 2-0 CT1 27 (SUTURE) ×2
SUT VIC AB 2-0 CT1 TAPERPNT 27 (SUTURE) IMPLANT
SUT VICRYL 0 UR6 27IN ABS (SUTURE) ×2 IMPLANT
SYR CONTROL 10ML LL (SYRINGE) IMPLANT
TAPE CLOTH SURG 6X10 WHT LF (GAUZE/BANDAGES/DRESSINGS) ×1 IMPLANT
TOWEL OR 17X26 10 PK STRL BLUE (TOWEL DISPOSABLE) ×2 IMPLANT
TUBE CONNECTING 12X1/4 (SUCTIONS) ×4 IMPLANT
WATER STERILE IRR 1000ML POUR (IV SOLUTION) ×2 IMPLANT

## 2018-09-30 NOTE — Op Note (Signed)
NAME: Jennifer Coleman, Jennifer Coleman. MEDICAL RECORD [id number] ACCOUNT [id number] DATE OF BIRTH:17-Jun-1951 FACILITY: [facility] LOCATION: [place], [place]  OPERATIVE REPORT  DATE OF PROCEDURE:  09/30/2018  PREOPERATIVE DIAGNOSES:   1.  Rotator cuff tear, left shoulder with impingement.   2.  Degenerative arthritis acromioclavicular joint.  POSTOPERATIVE DIAGNOSES:   1.  Rotator cuff tear, left shoulder with impingement. 2.  Degenerative arthritis acromioclavicular joint.  PROCEDURE: 1.  Examination of right shoulder under anesthesia. 2.  Diagnostic arthroscopy, right shoulder with debridement of synovitis and articular surface rotator cuff tearing.   3.  Arthroscopic subacromial decompression. 4.  Arthroscopic distal clavicle resection. 5.  Mini open rotator cuff tear repair.  SURGEON:  Joni Fears, MD  ASSISTANT:  Biagio Borg, PA-C.  ANESTHESIA:  General with supplemental interscalene nerve block.  COMPLICATIONS:  None.  HISTORY:  The patient has been followed over a period of months with problems referable to her left shoulder.  She has had difficulty with overhead activity and sleeping on that shoulder.  She has had a recent MRI scan after a poor response to  conservative treatment demonstrating a tear of the supraspinatus tendon with maximum retraction of 15 mm.  The infraspinatus and subscap tendons were intact.  There was moderate to advanced degenerative change at the distal clavicle and acromion with a  type 1-2 acromion.  There was minimal degenerative change at the glenohumeral joint.  She is now to have an arthroscopic evaluation based on her persistent symptoms and scan demonstrating a rotator cuff tear.  DESCRIPTION OF PROCEDURE:  The patient was met in the holding area and identified the left shoulder was appropriate operative site and marked it accordingly.  Anesthesia had performed an interscalene nerve block.  Any questions were answered.  The  patient was then transferred to room 7.  She was placed under general anesthesia without difficulty.  She was then placed in the semi-sitting position with the shoulder frame.  Timeout was called.  Left upper extremity  was then prepped with DuraPrep in the base of the neck circumferentially below the elbow.  Sterile draping was performed.  Timeout was called again.  A marking pen was used to outline the [facility] joint, the coracoid and acromion.  At a point a fingerbreadth posterior and medial to the posterior angle acromion a small stab wound was made and the arthroscope easily placed into the shoulder joint.  There was  a small clear yellow joint effusion.  There were minimal degenerative changes about the glenoid and the humeral head with minimal thinning.  Labrum appeared to be intact.  Subscapularis tendon was intact.  No loose bodies.  Biceps tendon appeared to be  intact.  There was what appeared to be a full thickness tear of the supraspinatus at the humeral head insertion or at least a high-grade partial tear.  This was debrided through a second portal established with a ribbed cannula anteriorly and using the  Arthrocare wand.  The arthroscope was then placed in the subacromial space posteriorly, the cannula in subacromial space anteriorly and a third portal was established in the lateral subacromial space.  An arthroscopic debridement was performed of abundant and moderately inflamed bursal tissue.  There was obvious anterior and lateral overhang of the acromion.  A 6 mm bur was used to obtain a flat surface.  The distal clavicle was obviously arthritic  with cyst formation distal clavicle resection was performed of about a centimeter.  The instruments were then removed.  I  did perform the mini open rotator cuff tear repair.  About an inch incision was made over the anterior aspect of the shoulder and by sharp dissection carried down to subcutaneous tissue.  There was abundant adipose  tissue  that was incised.  A raphae in the deltoid fascia was identified and incised.  The subacromial space was entered.  Of note is the fact that the patient's tissue appeared to be poor quality, which may have some impact on healing and can rotator  cuff healing as well.  I irrigated the subacromial space.  There was some tearing at the musculotendinous junction of the supraspinatus.  This was repaired with a running 0 Vicryl stitch.  There was also a tear in the substance of the cuff, which may or may not have been  related to insertion of the cannula.  This was probably 1.5 cm in length.  The edges appeared to be somewhat rounded.  I debrided them and then oversewed with 0 Ethibond suture.  I carefully evaluated the remainder of the rotator cuff.  The insertion of  the supra and infraspinatus tendon as well as the subscapularis appeared to be intact as I placed the arm through a range of motion, I did not see any further tearing.  The wound was then irrigated with saline solution.  We closed the deltoid fascia with a running 0 Vicryl.  The subcutaneous closed in several layers with Vicryl and then Monocryl.  Skin closed with Steri-Strips over benzoin.  The patient tolerated the  procedure well without complications.  Sling was applied and the patient returned to the postanesthesia recovery room.  Discharge with oxycodone for pain.  Office in 1 week.  TN/NUANCE  D:09/30/2018 T:09/30/2018 JOB:[id number]

## 2018-09-30 NOTE — Progress Notes (Signed)
Orthopedic Tech Progress Note Patient Details:  DAILY DOE 10/18/1951 [id number]  Ortho Devices Type of Ortho Device: Sling immobilizer Ortho Device/Splint Location: lue. replacement sling to XL. L sling was to small. Ortho Device/Splint Interventions: Ordered, Application, Adjustment   Post Interventions Patient Tolerated: Well Instructions Provided: Care of device, Adjustment of device   Karolee Stamps 09/30/2018, 3:00 PM

## 2018-09-30 NOTE — Anesthesia Procedure Notes (Signed)
Procedure Name: Intubation Date/Time: 09/30/2018 10:26 AM Performed by: Jenne Campus, CRNA Pre-anesthesia Checklist: Patient identified, Emergency Drugs available, Suction available and Patient being monitored Patient Re-evaluated:Patient Re-evaluated prior to induction Oxygen Delivery Method: Circle System Utilized Preoxygenation: Pre-oxygenation with 100% oxygen Induction Type: IV induction Ventilation: Mask ventilation without difficulty Laryngoscope Size: Miller and 2 Grade View: Grade I Tube type: Oral Tube size: 7.5 mm Number of attempts: 1 Airway Equipment and Method: Stylet and Oral airway Placement Confirmation: ETT inserted through vocal cords under direct vision,  positive ETCO2 and breath sounds checked- equal and bilateral Secured at: 22 cm Tube secured with: Tape Dental Injury: Teeth and Oropharynx as per pre-operative assessment

## 2018-09-30 NOTE — Progress Notes (Signed)
Patient has cell phone and clothing. This will be taken to PACU as husband is no longer at bedside.

## 2018-09-30 NOTE — Anesthesia Postprocedure Evaluation (Signed)
Anesthesia Post Note  Patient: Jennifer Coleman  Procedure(s) Performed: LEFT SHOULDER ARTHROSCOPY WITH SUBACROMIAL DECOMPRESSION AND DISTAL CLAVICLE EXCISION,MINI OPEN ROTATOR CUFF REPAIR (Left Shoulder)     Patient location during evaluation: PACU Anesthesia Type: General Level of consciousness: awake and alert Pain management: pain level controlled Vital Signs Assessment: post-procedure vital signs reviewed and stable Respiratory status: spontaneous breathing, nonlabored ventilation and respiratory function stable Cardiovascular status: blood pressure returned to baseline and stable Postop Assessment: no apparent nausea or vomiting Anesthetic complications: no    Last Vitals:  Vitals:   09/30/18 1314 09/30/18 1332  BP: 122/86 (!) 158/78  Pulse: 61 69  Resp: 19 18  Temp: (!) 36.4 C   SpO2: 100% 98%    Last Pain:  Vitals:   09/30/18 1332  PainSc: 0-No pain                 Brennan Bailey

## 2018-09-30 NOTE — Anesthesia Procedure Notes (Signed)
Anesthesia Regional Block: Interscalene brachial plexus block   Pre-Anesthetic Checklist: ,, timeout performed, Correct Patient, Correct Site, Correct Laterality, Correct Procedure, Correct Position, site marked, Risks and benefits discussed, pre-op evaluation,  At surgeon's request and post-op pain management  Laterality: Left  Prep: Maximum Sterile Barrier Precautions used, chloraprep       Needles:  Injection technique: Single-shot  Needle Type: Echogenic Stimulator Needle     Needle Length: 4cm  Needle Gauge: 22     Additional Needles:   Procedures:,,,, ultrasound used (permanent image in chart),,,,  Narrative:  Start time: 09/30/2018 9:12 AM End time: 09/30/2018 9:16 AM Injection made incrementally with aspirations every 5 mL.  Performed by: Personally  Anesthesiologist: Brennan Bailey, MD  Additional Notes: Risks, benefits, and alternative discussed. Patient gave consent for procedure. Patient prepped and draped in sterile fashion. Sedation administered, patient remains easily responsive to voice. Relevant anatomy identified with ultrasound guidance. Local anesthetic given in 5cc increments with no signs or symptoms of intravascular injection. No pain or paraesthesias with injection. Patient monitored throughout procedure with signs of LAST or immediate complications. Tolerated well. Ultrasound image placed in chart.  Tawny Asal, MD

## 2018-09-30 NOTE — Progress Notes (Signed)
The recent History & Physical has been reviewed. I have personally examined the patient today. There is no interval change to the documented History & Physical. The patient would like to proceed with the procedure.  [name] 09/30/2018,  9:51 AM  Patient ID: Jennifer Coleman, female   DOB: [birth year], 67 y.o.   MRN: [id number]

## 2018-09-30 NOTE — Progress Notes (Signed)
PATIENT ID:      ROSHAWNDA PECORA  MRN:     [id number] DOB/AGE:    Aug 26, 1951 / 67 y.o.       OPERATIVE REPORT    DATE OF PROCEDURE:  09/30/2018       PREOPERATIVE DIAGNOSIS:   left shoulder impingement syndrome,degenerative arthritis a-c joint, rotator cuff tear                                                       Estimated body mass index is 41.8 kg/m as calculated from the following:   Height as of 09/22/18: 5\' 5"  (1.651 m).   Weight as of 09/22/18: 113.9 kg.     POSTOPERATIVE DIAGNOSIS:  same                                                                   Estimated body mass index is 41.8 kg/m as calculated from the following:   Height as of 09/22/18: 5\' 5"  (1.651 m).   Weight as of 09/22/18: 113.9 kg.     PROCEDURE:  Procedure(s): LEFT SHOULDER ARTHROSCOPY WITH SUBACROMIAL DECOMPRESSION AND DISTAL CLAVICLE EXCISION,MINI OPEN ROTATOR CUFF REPAIR     SURGEON:  Joni Fears, MD    ASSISTANT:   Biagio Borg, PA-C   (Present and scrubbed throughout the case, critical for assistance with exposure, retraction, instrumentation, and closure.)          ANESTHESIA: regional and general     DRAINS: none :      TOURNIQUET TIME: * No tourniquets in log *    COMPLICATIONS:  None   CONDITION:  stable  PROCEDURE IN DETAIL: [id number]  [name] 09/30/2018, 11:45 AM  Patient ID: Cruzita Lederer, female   DOB: 1951/02/08, 67 y.o.   MRN: [id number]

## 2018-09-30 NOTE — Transfer of Care (Signed)
Immediate Anesthesia Transfer of Care Note  Patient: Jennifer Coleman  Procedure(s) Performed: LEFT SHOULDER ARTHROSCOPY WITH SUBACROMIAL DECOMPRESSION AND DISTAL CLAVICLE EXCISION,MINI OPEN ROTATOR CUFF REPAIR (Left Shoulder)  Patient Location: PACU  Anesthesia Type:General  Level of Consciousness: awake, oriented and patient cooperative  Airway & Oxygen Therapy: Patient Spontanous Breathing and Patient connected to face mask oxygen  Post-op Assessment: Report given to RN and Post -op Vital signs reviewed and stable  Post vital signs: Reviewed  Last Vitals:  Vitals Value Taken Time  BP 117/70 09/30/2018 12:15 PM  Temp    Pulse 73 09/30/2018 12:20 PM  Resp 20 09/30/2018 12:20 PM  SpO2 97 % 09/30/2018 12:20 PM  Vitals shown include unvalidated device data.  Last Pain:  Vitals:   09/30/18 1215  PainSc: (P) 0-No pain         Complications: No apparent anesthesia complications

## 2018-10-01 ENCOUNTER — Encounter (INDEPENDENT_AMBULATORY_CARE_PROVIDER_SITE_OTHER): Payer: Self-pay | Admitting: Orthopaedic Surgery

## 2018-10-01 ENCOUNTER — Ambulatory Visit (INDEPENDENT_AMBULATORY_CARE_PROVIDER_SITE_OTHER): Payer: 59 | Admitting: Orthopaedic Surgery

## 2018-10-01 DIAGNOSIS — M25512 Pain in left shoulder: Secondary | ICD-10-CM

## 2018-10-01 DIAGNOSIS — G8929 Other chronic pain: Secondary | ICD-10-CM

## 2018-10-01 MED ORDER — SCOPOLAMINE 1 MG/3DAYS TD PT72
MEDICATED_PATCH | TRANSDERMAL | Status: DC | PRN
  Administered 2018-09-30: 1 via TRANSDERMAL

## 2018-10-01 NOTE — Addendum Note (Signed)
Addendum  created 10/01/18 [id number] by Jenne Campus, CRNA   Intraprocedure Meds edited

## 2018-10-01 NOTE — Progress Notes (Signed)
Office Visit Note   Patient: Jennifer Coleman           Date of Birth: 12-06-1950           MRN: [id number] Visit Date: 10/01/2018              Requested by: Jennifer Coleman, Jennifer Coleman, Jennifer Coleman [id number] PCP: Jennifer Kirschner, MD   Assessment & Plan: Visit Diagnoses:  1. Chronic left shoulder pain     Plan: 1 day post left shoulder arthroscopy and rotator cuff tear repair Jennifer Coleman was concerned about some "noises" in her shoulder.  The wound looks fine.  She had some subcutaneous air that is probably there from the surgery which should just resolve but there is no evidence of any abnormality.  The shoulder block is "wearing off".  I have reapplied a new dressing and see her next week  Follow-Up Instructions: Return in about 1 week (around 10/08/2018).   Orders:  No orders of the defined types were placed in this encounter.  No orders of the defined types were placed in this encounter.     Procedures: No procedures performed   Clinical Data: No additional findings.   Subjective: Chief Complaint  Patient presents with  . Left Shoulder - Wound Check    09/30/18 Left Shoulder Arthroscopy, SAD, DCE, Mini Open Rotator Cuff Repair  Patient presents today for wound check after left shoulder arthroscopy yesterday. She states that she feels something "sloshing" under the bandage and that she can hear it. She also has noticed new blood on her steri strips. Patient is wearing her sling and has tingling in her fingers where the nerve block seems to be wearing off.  HPI  Review of Systems   Objective: Vital Signs: There were no vitals taken for this visit.  Physical Exam  Ortho Exam dressing removed from left shoulder.  Wound looks fine.  No drainage.  Some crepitation beneath the skin probably consistent with some air that was trapped at the time of surgery.  No drainage no redness  Specialty Comments:  No specialty comments  available.  Imaging: No results found.   PMFS History: Patient Active Problem List   Diagnosis Date Noted  . Impingement syndrome of left shoulder 09/30/2018  . Unspecified rotator cuff tear or rupture of right shoulder, not specified as traumatic 09/30/2018  . Acromioclavicular joint arthritis 09/30/2018  . Sun-damaged skin 01/03/2018  . Morbid obesity ([place]) 11/05/2017  . Depression with anxiety 11/05/2017  . Nontraumatic incomplete tear of right rotator cuff 09/25/2016  . Foreign body of shoulder right  09/25/2016  . 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 Shoulder joint painful on movement, right 08/29/2016  . Epigastric pain 08/11/2015  . Chest pain, rule out acute myocardial infarction 08/11/2015  . Renal insufficiency 08/11/2015  . Venous stasis 04/13/2015  . Depression 04/13/2015  . Essential hypertension 09/12/2014  . PVCs (premature ventricular contractions) 09/07/2014  . Precordial pain 09/07/2014   Past Medical History:  Diagnosis Date  . Anxiety   . Arthritis   . Dyspnea   . Dysrhythmia   . Family history of anesthesia complication 25 yrs ago   Sister allergic to sccinocholine - unable to come off ventilator  . Hypertension   . Pneumonia   . PONV (postoperative nausea and vomiting)   . Premature ventricular contractions   . Shingles   . Varicose veins   . Varicose veins of left lower  extremity     Family History  Problem Relation Age of Onset  . Heart disease Sister   . Heart disease Brother        49's  . Diabetes Mellitus II Sister   . Multiple myeloma Mother   . Hypertension Sister   . Hypertension Father   . Heart attack Father     Past Surgical History:  Procedure Laterality Date  . CARPAL TUNNEL RELEASE    . CERVICAL DISC SURGERY  1992   Fusion  . COLONOSCOPY N/A 06/07/2016   Procedure: COLONOSCOPY;  Surgeon: Rogene Houston, MD;  [facility];  Service: Endoscopy;  Laterality: N/A;  1:00  . KNEE ARTHROSCOPY   09/16/2012   Procedure: ARTHROSCOPY KNEE;  Surgeon: [name], MD;  Location: [place];  Service: Orthopedics;  Laterality: Right;  Right Knee Arthroscopy  . SALPINGOOPHORECTOMY Left 1985  . SHOULDER ARTHROSCOPY WITH OPEN ROTATOR CUFF REPAIR AND DISTAL CLAVICLE ACROMINECTOMY Right 05/03/2016   Procedure: RIGHT SHOULDER ARTHROSCOPY WITH MINI-OPEN ROTATOR CUFF REPAIR,DISTAL CLAVICLE RESECTION, SUBACROMIAL DECOMPRESSION.;  Surgeon: [name], MD;  Location: [place];  Service: Orthopedics;  Laterality: Right;  . SHOULDER ARTHROSCOPY WITH ROTATOR CUFF REPAIR Right 09/25/2016   Procedure: Right Shoulder Manipulation, Arthroscopic Debridement of Joint, Removal of Loose Body and Re-Repair of Rotator Cuff;  Surgeon: [name], MD;  Location: [place];  Service: Orthopedics;  Laterality: Right;   Social History   Occupational History  . Not on file  Tobacco Use  . Smoking status: Former Smoker    Types: Cigarettes    Last attempt to quit: 09/22/1992    Years since quitting: 26.0  . Smokeless tobacco: Never Used  Substance and Sexual Activity  . Alcohol use: No    Alcohol/week: 0.0 standard drinks  . Drug use: No  . Sexual activity: Yes

## 2018-10-08 ENCOUNTER — Ambulatory Visit (INDEPENDENT_AMBULATORY_CARE_PROVIDER_SITE_OTHER): Payer: 59 | Admitting: Orthopaedic Surgery

## 2018-10-08 ENCOUNTER — Encounter (INDEPENDENT_AMBULATORY_CARE_PROVIDER_SITE_OTHER): Payer: Self-pay | Admitting: Orthopaedic Surgery

## 2018-10-08 VITALS — BP 120/60 | HR 66 | Ht 65.0 in | Wt 240.0 lb

## 2018-10-08 DIAGNOSIS — G8929 Other chronic pain: Secondary | ICD-10-CM

## 2018-10-08 DIAGNOSIS — M25512 Pain in left shoulder: Secondary | ICD-10-CM

## 2018-10-08 NOTE — Progress Notes (Signed)
Office Visit Note   Patient: Jennifer Coleman           Date of Birth: 1951/03/02           MRN: [id number] Visit Date: 10/08/2018              Requested by: Mikey Kirschner, [place] [address] [place], [place] [id number] PCP: Mikey Kirschner, MD   Assessment & Plan: Visit Diagnoses:  1. Chronic left shoulder pain     Plan: 6 days status post rotator cuff tear repair left shoulder with a mini incision and doing well.  Dressing removed Steri-Strips reapplied.  We will start circumduction exercises and physical therapy at [facility] next week.  Office 3 to 4 weeks.  Continue with sling  Follow-Up Instructions: Return in about 1 month (around 11/08/2018).   Orders:  Orders Placed This Encounter  Procedures  . Ambulatory referral to Physical Therapy   No orders of the defined types were placed in this encounter.     Procedures: No procedures performed   Clinical Data: No additional findings.   Subjective: Chief Complaint  Patient presents with  . Left Shoulder - Routine Post Op    09/30/18 Left Shoulder Arthroscopy with SAD,DCE, Mini Open Rotator Cuff Repair  Patient presents status post left shoulder arthroscopy with SAD, DCE, and mini open rotator cuff repair. She is doing well. She continues to wear the sling, but may take her arm out occasionally to let it hang and relax. She states the "sloushing" she heard previously is gone. She takes ibuprofen and uses ice daily. She takes percocet sparingly for intense pain or ache.   HPI  Review of Systems   Objective: Vital Signs: BP 120/60   Pulse 66   Ht '5\' 5"'$  (1.651 m)   Wt 240 lb (108.9 kg)   BMI 39.94 kg/m   Physical Exam  Ortho Exam incision is healing nicely left shoulder without any complications.  Good grip and good release.  Neurovascular exam intact  Specialty Comments:  No specialty comments available.  Imaging: No results found.   PMFS History: Patient Active Problem List   Diagnosis Date  Noted  . Impingement syndrome of left shoulder 09/30/2018  . Unspecified rotator cuff tear or rupture of right shoulder, not specified as traumatic 09/30/2018  . Acromioclavicular joint arthritis 09/30/2018  . Sun-damaged skin 01/03/2018  . Morbid obesity ([place]) 11/05/2017  . Depression with anxiety 11/05/2017  . Nontraumatic incomplete tear of right rotator cuff 09/25/2016  . Foreign body of shoulder right  09/25/2016  . 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 Shoulder joint painful on movement, right 08/29/2016  . Epigastric pain 08/11/2015  . Chest pain, rule out acute myocardial infarction 08/11/2015  . Renal insufficiency 08/11/2015  . Venous stasis 04/13/2015  . Depression 04/13/2015  . Essential hypertension 09/12/2014  . PVCs (premature ventricular contractions) 09/07/2014  . Precordial pain 09/07/2014   Past Medical History:  Diagnosis Date  . Anxiety   . Arthritis   . Dyspnea   . Dysrhythmia   . Family history of anesthesia complication 25 yrs ago   Sister allergic to sccinocholine - unable to come off ventilator  . Hypertension   . Pneumonia   . PONV (postoperative nausea and vomiting)   . Premature ventricular contractions   . Shingles   . Varicose veins   . Varicose veins of left lower extremity     Family History  Problem Relation Age  of Onset  . Heart disease Sister   . Heart disease Brother        57's  . Diabetes Mellitus II Sister   . Multiple myeloma Mother   . Hypertension Sister   . Hypertension Father   . Heart attack Father     Past Surgical History:  Procedure Laterality Date  . CARPAL TUNNEL RELEASE    . CERVICAL DISC SURGERY  1992   Fusion  . COLONOSCOPY N/A 06/07/2016   Procedure: COLONOSCOPY;  Surgeon: Rogene Houston, MD;  [facility];  Service: Endoscopy;  Laterality: N/A;  1:00  . KNEE ARTHROSCOPY  09/16/2012   Procedure: ARTHROSCOPY KNEE;  Surgeon: [name], MD;  Location: [place];  Service:  Orthopedics;  Laterality: Right;  Right Knee Arthroscopy  . SALPINGOOPHORECTOMY Left 1985  . SHOULDER ARTHROSCOPY WITH OPEN ROTATOR CUFF REPAIR AND DISTAL CLAVICLE ACROMINECTOMY Right 05/03/2016   Procedure: RIGHT SHOULDER ARTHROSCOPY WITH MINI-OPEN ROTATOR CUFF REPAIR,DISTAL CLAVICLE RESECTION, SUBACROMIAL DECOMPRESSION.;  Surgeon: [name], MD;  Location: [place];  Service: Orthopedics;  Laterality: Right;  . SHOULDER ARTHROSCOPY WITH ROTATOR CUFF REPAIR Right 09/25/2016   Procedure: Right Shoulder Manipulation, Arthroscopic Debridement of Joint, Removal of Loose Body and Re-Repair of Rotator Cuff;  Surgeon: [name], MD;  Location: [place];  Service: Orthopedics;  Laterality: Right;   Social History   Occupational History  . Not on file  Tobacco Use  . Smoking status: Former Smoker    Types: Cigarettes    Last attempt to quit: 09/22/1992    Years since quitting: 26.0  . Smokeless tobacco: Never Used  Substance and Sexual Activity  . Alcohol use: No    Alcohol/week: 0.0 standard drinks  . Drug use: No  . Sexual activity: Yes

## 2018-10-14 ENCOUNTER — Telehealth (HOSPITAL_COMMUNITY): Payer: Self-pay | Admitting: Family Medicine

## 2018-10-14 ENCOUNTER — Telehealth (INDEPENDENT_AMBULATORY_CARE_PROVIDER_SITE_OTHER): Payer: Self-pay | Admitting: Orthopaedic Surgery

## 2018-10-14 DIAGNOSIS — I10 Essential (primary) hypertension: Secondary | ICD-10-CM | POA: Diagnosis not present

## 2018-10-14 DIAGNOSIS — M2550 Pain in unspecified joint: Secondary | ICD-10-CM | POA: Diagnosis not present

## 2018-10-14 DIAGNOSIS — K219 Gastro-esophageal reflux disease without esophagitis: Secondary | ICD-10-CM | POA: Diagnosis not present

## 2018-10-14 NOTE — Telephone Encounter (Signed)
10-14-18 I call Dr Wonda Horner office and spoke with April concerning a referral for OT that we are waiting on. She is going to mark it urdent so that we will have it on time.

## 2018-10-14 NOTE — Telephone Encounter (Signed)
Order changed in [place], order faxed. Thank you.

## 2018-10-14 NOTE — Telephone Encounter (Signed)
Forestine Na requesting for an OT order (not PT)  to be faxed for Lt shoulder today. Patient has appt first in am tomorrow. Fax# [phone].

## 2018-10-15 ENCOUNTER — Other Ambulatory Visit: Payer: Self-pay

## 2018-10-15 ENCOUNTER — Encounter (HOSPITAL_COMMUNITY): Payer: Self-pay

## 2018-10-15 ENCOUNTER — Ambulatory Visit (HOSPITAL_COMMUNITY): Payer: 59 | Attending: Orthopaedic Surgery | Admitting: Occupational Therapy

## 2018-10-15 ENCOUNTER — Encounter (HOSPITAL_COMMUNITY): Payer: Self-pay | Admitting: Specialist

## 2018-10-15 ENCOUNTER — Ambulatory Visit (HOSPITAL_COMMUNITY): Payer: 59 | Admitting: Specialist

## 2018-10-15 DIAGNOSIS — R29898 Other symptoms and signs involving the musculoskeletal system: Secondary | ICD-10-CM | POA: Diagnosis not present

## 2018-10-15 DIAGNOSIS — M25612 Stiffness of left shoulder, not elsewhere classified: Secondary | ICD-10-CM

## 2018-10-15 DIAGNOSIS — M25512 Pain in left shoulder: Secondary | ICD-10-CM | POA: Insufficient documentation

## 2018-10-15 NOTE — Therapy (Signed)
[place], [place], [address] Phone: [phone]   Fax:  [phone]  Occupational Therapy Evaluation  Patient Details  Name: Jennifer Coleman MRN: [id number] Date of Birth: 21-Sep-1951 Referring Provider (OT): Dr. Joni Fears   Encounter Date: 10/15/2018  OT End of Session - 10/15/18 1441    Visit Number  1    Number of Visits  16    Date for OT Re-Evaluation  12/14/18    Authorization Type  UMR    Authorization Time Period  25 visit limit, starts over on 10/29/18    Authorization - Visit Number  1    Authorization - Number of Visits  25    OT Start Time  1020    OT Stop Time  1100    OT Time Calculation (min)  40 min    Activity Tolerance  Patient tolerated treatment well    Behavior During Therapy  [facility] for tasks assessed/performed       Past Medical History:  Diagnosis Date  . Anxiety   . Arthritis   . Dyspnea   . Dysrhythmia   . Family history of anesthesia complication 25 yrs ago   Sister allergic to sccinocholine - unable to come off ventilator  . Hypertension   . Pneumonia   . PONV (postoperative nausea and vomiting)   . Premature ventricular contractions   . Shingles   . Varicose veins   . Varicose veins of left lower extremity     Past Surgical History:  Procedure Laterality Date  . CARPAL TUNNEL RELEASE    . CERVICAL DISC SURGERY  1992   Fusion  . COLONOSCOPY N/A 06/07/2016   Procedure: COLONOSCOPY;  Surgeon: Rogene Houston, MD;  [facility];  Service: Endoscopy;  Laterality: N/A;  1:00  . KNEE ARTHROSCOPY  09/16/2012   Procedure: ARTHROSCOPY KNEE;  Surgeon: [name], MD;  Location: [place];  Service: Orthopedics;  Laterality: Right;  Right Knee Arthroscopy  . SALPINGOOPHORECTOMY Left 1985  . SHOULDER ARTHROSCOPY WITH OPEN ROTATOR CUFF REPAIR AND DISTAL CLAVICLE ACROMINECTOMY Right 05/03/2016   Procedure: RIGHT SHOULDER ARTHROSCOPY WITH MINI-OPEN ROTATOR CUFF  REPAIR,DISTAL CLAVICLE RESECTION, SUBACROMIAL DECOMPRESSION.;  Surgeon: [name], MD;  Location: [place];  Service: Orthopedics;  Laterality: Right;  . SHOULDER ARTHROSCOPY WITH ROTATOR CUFF REPAIR Right 09/25/2016   Procedure: Right Shoulder Manipulation, Arthroscopic Debridement of Joint, Removal of Loose Body and Re-Repair of Rotator Cuff;  Surgeon: [name], MD;  Location: [place];  Service: Orthopedics;  Laterality: Right;    There were no vitals filed for this visit.  Subjective Assessment - 10/15/18 1253    Subjective   S:  I fell in September and tore my left rotator cuff.  I waited until Decemeber to have surgery.     Pertinent History  Jennifer Coleman reports falling in September 2019, which caused a tear of her left rotator cuff.  She underwent left RCR and SAD/DCE surgery, performed by Dr. Durward Fortes on 09/30/18.  She has been referred to occupational therapy for evaluation and treatment and presents today with her left arm in a sling.     Special Tests  FOTO:  21.83    Patient Stated Goals  I want to get stronger and improve my range of motion.     Currently in Pain?  Yes    Pain Score  2     Pain Location  Shoulder  Pain Orientation  Left    Pain Descriptors / Indicators  Aching    Pain Type  Acute pain    Pain Radiating Towards  elbow    Pain Onset  More than a month ago    Pain Frequency  Intermittent    Aggravating Factors   with movement or change of position, her pain can increase to 7/10.    Pain Relieving Factors  ice, rest, sling    Effect of Pain on Daily Activities  max        [facility] OT Assessment - 10/15/18 0001      Assessment   Medical Diagnosis  S/P Left RCR, SAD/DCE    Referring Provider (OT)  Dr. Joni Fears    Onset Date/Surgical Date  09/30/18    Hand Dominance  Right    Next MD Visit  11/05/18    Prior Therapy  for R RCR      Precautions   Precautions  Shoulder    Type of Shoulder Precautions  requested protocol  from MD, for now PROM thru 11/11/18     Required Braces or Orthoses  Sling      Balance Screen   Has the patient fallen in the past 6 months  Yes    How many times?  1    Has the patient had a decrease in activity level because of a fear of falling?   No    Is the patient reluctant to leave their home because of a fear of falling?   No      Home  Environment   Family/patient expects to be discharged to:  Private residence    Lives With  Family      Prior Function   Level of Independence  Independent    Vocation  Full time employment    Vocation Requirements  medical coding for [employer]  spending time with family, caring for her new puppy      ADL   ADL comments  Jennifer Coleman is unable to use her left arm to complete functional BADLs, IADLs, unable to work, unable to pick up her puppy       Written Expression   Dominant Hand  Right      Vision - History   Baseline Vision  Wears glasses all the time      Cognition   Overall Cognitive Status  Within Functional Limits for tasks assessed      Observation/Other Assessments   Focus on Therapeutic Outcomes (FOTO)   21.83/100      Sensation   Light Touch  Appears Intact      Coordination   Gross Motor Movements are Fluid and Coordinated  Yes    Fine Motor Movements are Fluid and Coordinated  Yes      ROM / Strength   AROM / PROM / Strength  AROM;PROM;Strength      Palpation   Palpation comment  max scar restrictions and fascial restrictions noted in left shoulder region, scapular region, and upper arm       AROM   Overall AROM Comments  not assessed due to recent surgery    AROM Assessment Site  Shoulder    Right/Left Shoulder  Left      PROM   Overall PROM Comments  assessed in supine, external and internal rotation with shoulder adducted    PROM Assessment Site  Shoulder    Right/Left Shoulder  Left    Left  Shoulder Flexion  100 Degrees    Left Shoulder ABduction  55 Degrees    Left Shoulder Internal  Rotation  75 Degrees    Left Shoulder External Rotation  0 Degrees      Strength   Overall Strength Comments  not assessed due to recent surgery     Strength Assessment Site  Shoulder    Right/Left Shoulder  Left               OT Treatments/Exercises (OP) - 10/15/18 0001      Exercises   Exercises  Shoulder      Shoulder Exercises: Supine   External Rotation  PROM;5 reps    Internal Rotation  PROM;5 reps    Flexion  PROM;5 reps    ABduction  PROM;5 reps      Manual Therapy   Manual Therapy  Myofascial release    Manual therapy comments  manual therapy completed seperately from all other interventions    Myofascial Release  myofascial release and manual stretching to left upper arm, scapular and shoulder region to decrease pain and restrictions and improve pain free mobility.              OT Education - 10/15/18 1440    Education Details  educated patient on HEP for towel slides, pendulums and active elbow, forearm and wrist movement    Person(s) Educated  Patient    Methods  Explanation;Demonstration;Handout    Comprehension  Verbalized understanding;Returned demonstration       OT Short Term Goals - 10/15/18 1446      OT SHORT TERM GOAL #1   Title  Patient will be educated on a HEP for left shoulder ROM and strength.    Time  4    Period  Weeks    Status  New    Target Date  11/14/18      OT SHORT TERM GOAL #2   Title  Patient will improve left shoulder  P/ROM to [facility] in order to complete B/ADLs with increased independence.    Time  4    Period  Weeks    Status  New      OT SHORT TERM GOAL #3   Title  Patient will improve left shoulder and strength to 3+/5 for increased ability to pick up baskets of laundry.    Time  4    Period  Weeks    Status  New      OT SHORT TERM GOAL #4   Title  Patient will decrease left shoulder pain to 4/10 or better in order to complete B/ADLs with greater independence.    Time  4    Period  Weeks    Status  New       OT SHORT TERM GOAL #5   Title  Patient will decrease fascial restrictions to moderate amount in left shoulder region for greater mobility needed for daily tasks.     Time  4    Period  Weeks    Status  New        OT Long Term Goals - 10/15/18 1448      OT LONG TERM GOAL #1   Title  Patient will return to prior level of independence with all B/IADLs, work, and leisure activities using her left arm normally.     Time  8    Period  Weeks    Status  New    Target Date  12/14/18  OT LONG TERM GOAL #2   Title  Patient will improve left shoulder A/ROM to WNL for greater ability to use left arm actively work and home.     Time  8    Period  Weeks    Status  New      OT LONG TERM GOAL #3   Title  Patient will improve left shoulder and elbow strength to 5/5 for greater ability to lift bags of mulch when completing yardwork.      Time  8    Period  Weeks    Status  New      OT LONG TERM GOAL #4   Title  Patient will decrease left shoulder pain to 2/10 or better when completing functional actiivites.     Time  8    Period  Weeks    Status  New      OT LONG TERM GOAL #5   Title  Paitent will decrease left fascial restrictions in left shoulder to trace amount in order to have greater mobility needed for use of left arm with functional activiites.      Time  8    Period  Weeks    Status  New            Plan - 10/15/18 1442    Clinical Impression Statement  A:  Patient is a 67 year old with past medical history significant for right RCR, arthritis, anxiety, hypertension.  In September, the patient fell and injured her left rotator cuff.  She underwent surgery on 09/30/18 for left rcr, sad/dce.  Patient has been referred to occupational therapy for evaluation and treatment following Dr. Rudene Anda protocol.      Occupational Profile and client history currently impacting functional performance  traumatic injury vs wear and tear    Occupational performance deficits (Please  refer to evaluation for details):  ADL's;IADL's;Rest and Sleep;Work;Leisure;Social Participation    Rehab Potential  Good    Current Impairments/barriers affecting progress:  motivated, has participated in similar therapy for her right shoulder in the past     OT Frequency  2x / week    OT Duration  8 weeks    OT Treatment/Interventions  Self-care/ADL training;Moist Heat;DME and/or AE instruction;Therapeutic activities;Contrast Bath;Ultrasound;Therapeutic exercise;Scar mobilization;Passive range of motion;Neuromuscular education;Iontophoresis;Cryotherapy;Energy conservation;Manual Therapy;Patient/family education;Electrical Stimulation;Paraffin    Plan  P:  SKilled OT intervention to decrease pain and restrictions and improve pain free mobility in her left shoulder region in order to return to prior level of independence with all B/IADLS, work, leisure activities     Clinical Decision Making  Limited treatment options, no task modification necessary    Mora  pendulums and towel slides     Consulted and Agree with Plan of Care  Patient       Patient will benefit from skilled therapeutic intervention in order to improve the following deficits and impairments:  Decreased range of motion, Impaired flexibility, Increased muscle spasms, Impaired UE functional use, Pain, Decreased strength  Visit Diagnosis: Stiffness of left shoulder, not elsewhere classified - Plan: Ot plan of care cert/re-cert  Acute pain of left shoulder - Plan: Ot plan of care cert/re-cert  Other symptoms and signs involving the musculoskeletal system - Plan: Ot plan of care cert/re-cert    Problem List Patient Active Problem List   Diagnosis Date Noted  . Impingement syndrome of left shoulder 09/30/2018  . Unspecified rotator cuff tear or rupture of right shoulder, not specified as traumatic  09/30/2018  . Acromioclavicular joint arthritis 09/30/2018  . Sun-damaged skin 01/03/2018  . Morbid obesity ([place])  11/05/2017  . Depression with anxiety 11/05/2017  . Nontraumatic incomplete tear of right rotator cuff 09/25/2016  . Foreign body of shoulder right  09/25/2016  . 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 Shoulder joint painful on movement, right 08/29/2016  . Epigastric pain 08/11/2015  . Chest pain, rule out acute myocardial infarction 08/11/2015  . Renal insufficiency 08/11/2015  . Venous stasis 04/13/2015  . Depression 04/13/2015  . Essential hypertension 09/12/2014  . PVCs (premature ventricular contractions) 09/07/2014  . Precordial pain 09/07/2014    Vangie Bicker, [place], OTR/L [phone]  10/15/2018, 2:52 PM  [place] [address] [place], [place], [address] Phone: [phone]   Fax:  [phone]  Name: TATIJANA BIERLY MRN: [id number] Date of Birth: 07-05-1951

## 2018-10-15 NOTE — Patient Instructions (Signed)
TOWEL SLIDES COMPLETE FOR 1-3 MINUTES, 3-5 TIMES PER DAY  SHOULDER: Flexion On Table   Place hands on table, elbows straight. Move hips away from body. Press hands down into table. Hold ___ seconds. ___ reps per set, ___ sets per day, ___ days per week  Abduction (Passive)   With arm out to side, resting on table, lower head toward arm, keeping trunk away from table. Hold ____ seconds. Repeat ____ times. Do ____ sessions per day.  Copyright  VHI. All rights reserved.     Internal Rotation (Assistive)   Seated with elbow bent at right angle and held against side, slide arm on table surface in an inward arc. Repeat ____ times. Do ____ sessions per day. Activity: Use this motion to brush crumbs off the table.  Copyright  VHI. All rights reserved.    COMPLETE PENDULUM EXERCISES FOR 30 SECONDS TO A MINUTE EACH, 3-5 TIMES PER DAY. ROM: Pendulum (Side-to-Side)   Deje que el brazo derecho se balancee suavemente de lado a lado mientras oscila el cuerpo de igual manera. Repita ____ veces por rutina. Realice ____ rutinas por sesin. Realice ____ sesiones por da.  http://orth.exer.us/792   Copyright  VHI. All rights reserved.  Pendulum Forward/Back   Bend forward 90 at waist, using table for support. Rock body forward and back to swing arm. Repeat ____ times. Do ____ sessions per day.  Copyright  VHI. All rights reserved.  Pendulum Circular   Bend forward 90 at waist, leaning on table for support. Rock body in a circular pattern to move arm clockwise ____ times then counterclockwise ____ times. Do ____ sessions per day.  Copyright  VHI. All rights reserved.  AROM: Wrist Extension   With right palm down, bend wrist up. Repeat 10____ times per set. Do ____ sets per session. Do __3__ sessions per day.  Copyright  VHI. All rights reserved.   AROM: Wrist Flexion   With right palm up, bend wrist up. Repeat ___10_ times per set. Do ____ sets per session. Do __3__  sessions per day.  Copyright  VHI. All rights reserved.   AROM: Forearm Pronation / Supination   With right arm in handshake position, slowly rotate palm down until stretch is felt. Relax. Then rotate palm up until stretch is felt. Repeat __10__ times per set. Do ____ sets per session. Do __3__ sessions per day.  Copyright  VHI. All rights reserved.   AFlexion (Passive)   Use other hand to bend elbow, with thumb toward same shoulder. Do NOT force this motion. Hold ____ seconds. Repeat ____ times. Do ____ sessions per day.  Copyright  VHI. All rights reserved.    With left hand palm up, gently bend elbow as far as possible. Then straighten arm as far as possible. Repeat ____ times per set. Do ____ sets per session. Do ____ sessions per day.  Copyright  VHI. All rights reserved.     

## 2018-10-20 ENCOUNTER — Encounter (HOSPITAL_COMMUNITY): Payer: Self-pay

## 2018-10-20 ENCOUNTER — Ambulatory Visit (HOSPITAL_COMMUNITY): Payer: 59

## 2018-10-20 DIAGNOSIS — M25612 Stiffness of left shoulder, not elsewhere classified: Secondary | ICD-10-CM | POA: Diagnosis not present

## 2018-10-20 DIAGNOSIS — R29898 Other symptoms and signs involving the musculoskeletal system: Secondary | ICD-10-CM | POA: Diagnosis not present

## 2018-10-20 DIAGNOSIS — M25512 Pain in left shoulder: Secondary | ICD-10-CM

## 2018-10-20 NOTE — Therapy (Signed)
[place], [place], [address] Phone: [phone]   Fax:  [phone]  Occupational Therapy Treatment  Patient Details  Name: Jennifer Coleman MRN: [id number] Date of Birth: 1950/11/17 Referring Provider (OT): Dr. Joni Fears   Encounter Date: 10/20/2018  OT End of Session - 10/20/18 1343    Visit Number  2    Number of Visits  16    Date for OT Re-Evaluation  12/14/18    Authorization Type  UMR    Authorization Time Period  25 visit limit, starts over on 10/29/18    Authorization - Visit Number  2    Authorization - Number of Visits  25    OT Start Time  3614   Pt arrived late   OT Stop Time  1350    OT Time Calculation (min)  33 min    Activity Tolerance  Patient tolerated treatment well    Behavior During Therapy  [facility] for tasks assessed/performed       Past Medical History:  Diagnosis Date  . Anxiety   . Arthritis   . Dyspnea   . Dysrhythmia   . Family history of anesthesia complication 25 yrs ago   Sister allergic to sccinocholine - unable to come off ventilator  . Hypertension   . Pneumonia   . PONV (postoperative nausea and vomiting)   . Premature ventricular contractions   . Shingles   . Varicose veins   . Varicose veins of left lower extremity     Past Surgical History:  Procedure Laterality Date  . CARPAL TUNNEL RELEASE    . CERVICAL DISC SURGERY  1992   Fusion  . COLONOSCOPY N/A 06/07/2016   Procedure: COLONOSCOPY;  Surgeon: Rogene Houston, MD;  [facility];  Service: Endoscopy;  Laterality: N/A;  1:00  . KNEE ARTHROSCOPY  09/16/2012   Procedure: ARTHROSCOPY KNEE;  Surgeon: [name], MD;  Location: [place];  Service: Orthopedics;  Laterality: Right;  Right Knee Arthroscopy  . SALPINGOOPHORECTOMY Left 1985  . SHOULDER ARTHROSCOPY WITH OPEN ROTATOR CUFF REPAIR AND DISTAL CLAVICLE ACROMINECTOMY Right 05/03/2016   Procedure: RIGHT SHOULDER ARTHROSCOPY WITH MINI-OPEN  ROTATOR CUFF REPAIR,DISTAL CLAVICLE RESECTION, SUBACROMIAL DECOMPRESSION.;  Surgeon: [name], MD;  Location: [place];  Service: Orthopedics;  Laterality: Right;  . SHOULDER ARTHROSCOPY WITH ROTATOR CUFF REPAIR Right 09/25/2016   Procedure: Right Shoulder Manipulation, Arthroscopic Debridement of Joint, Removal of Loose Body and Re-Repair of Rotator Cuff;  Surgeon: [name], MD;  Location: [place];  Service: Orthopedics;  Laterality: Right;    There were no vitals filed for this visit.  Subjective Assessment - 10/20/18 1331    Subjective   S: My shoulder needs to be stretched.     Currently in Pain?  Yes    Pain Score  7     Pain Location  Shoulder    Pain Orientation  Left    Pain Descriptors / Indicators  Sore    Pain Type  Acute pain         [facility] OT Assessment - 10/20/18 1336      Assessment   Medical Diagnosis  S/P Left RCR, SAD/DCE      Precautions   Precautions  Shoulder    Type of Shoulder Precautions  Follow protocol from Dr. Durward Fortes    Shoulder Interventions  Shoulder sling/immobilizer  OT Treatments/Exercises (OP) - 10/20/18 1336      Exercises   Exercises  Shoulder      Shoulder Exercises: Supine   Protraction  PROM;AAROM;10 reps   Therapist unweighted arm   Horizontal ABduction  PROM;10 reps    External Rotation  PROM;10 reps;Limitations    External Rotation Limitations  to 45 degrees    Internal Rotation  PROM;10 reps    Flexion  PROM;10 reps    ABduction  PROM;10 reps;Limitations    ABduction Limitations  to 90 degrees      Shoulder Exercises: Seated   Extension  AROM;10 reps    Row  AROM;10 reps    Other Seated Exercises  scapular depressiong; A/ROM; 10X      Shoulder Exercises: Therapy Ball   Flexion  Both;10 reps    ABduction  Both;10 reps      Shoulder Exercises: Isometric Strengthening   Extension  Supine;3X3"    Internal Rotation  Supine;3X3"      Manual Therapy   Manual Therapy   Myofascial release    Manual therapy comments  manual therapy completed seperately from all other interventions    Myofascial Release  myofascial release and manual stretching to left upper arm, scapular and shoulder region to decrease pain and restrictions and improve pain free mobility.               OT Education - 10/20/18 1427    Education Details  reviewed goals with patient    Person(s) Educated  Patient    Methods  Explanation    Comprehension  Verbalized understanding       OT Short Term Goals - 10/20/18 1342      OT SHORT TERM GOAL #1   Title  Patient will be educated on a HEP for left shoulder ROM and strength.    Time  4    Period  Weeks    Status  On-going      OT SHORT TERM GOAL #2   Title  Patient will improve left shoulder  P/ROM to [facility] in order to complete B/ADLs with increased independence.    Time  4    Period  Weeks    Status  On-going      OT SHORT TERM GOAL #3   Title  Patient will improve left shoulder and strength to 3+/5 for increased ability to pick up baskets of laundry.    Time  4    Period  Weeks    Status  On-going      OT SHORT TERM GOAL #4   Title  Patient will decrease left shoulder pain to 4/10 or better in order to complete B/ADLs with greater independence.    Time  4    Period  Weeks    Status  On-going      OT SHORT TERM GOAL #5   Title  Patient will decrease fascial restrictions to moderate amount in left shoulder region for greater mobility needed for daily tasks.     Time  4    Period  Weeks    Status  On-going        OT Long Term Goals - 10/20/18 1342      OT LONG TERM GOAL #1   Title  Patient will return to prior level of independence with all B/IADLs, work, and leisure activities using her left arm normally.     Time  8    Period  Weeks    Status  On-going      OT LONG TERM GOAL #2   Title  Patient will improve left shoulder A/ROM to WNL for greater ability to use left arm actively work and home.     Time  8     Period  Weeks    Status  On-going      OT LONG TERM GOAL #3   Title  Patient will improve left shoulder and elbow strength to 5/5 for greater ability to lift bags of mulch when completing yardwork.      Time  8    Period  Weeks    Status  On-going      OT LONG TERM GOAL #4   Title  Patient will decrease left shoulder pain to 2/10 or better when completing functional actiivites.     Time  8    Period  Weeks    Status  On-going      OT LONG TERM GOAL #5   Title  Paitent will decrease left fascial restrictions in left shoulder to trace amount in order to have greater mobility needed for use of left arm with functional activiites.      Time  8    Period  Weeks    Status  On-going            Plan - 10/20/18 1407    Clinical Impression Statement  A: Patient arrived late. Initiated week 3 of protocol from Dr. Durward Fortes. Pt complete P/ROM supine, therapy ball stretches, thumb tacks, and isometrics. VC for form and technique completed. Mnaual techniques completed briefly at end of session to focus on fascial restrictions.     Plan  P: Add pro/ret/elev/dep. Complete manual techniques to focus on decreasing fascial restrictions to increase functional mobility needed for basic reaching tasks.     Consulted and Agree with Plan of Care  Patient       Patient will benefit from skilled therapeutic intervention in order to improve the following deficits and impairments:  Decreased range of motion, Impaired flexibility, Increased muscle spasms, Impaired UE functional use, Pain, Decreased strength  Visit Diagnosis: Stiffness of left shoulder, not elsewhere classified  Acute pain of left shoulder  Other symptoms and signs involving the musculoskeletal system    Problem List Patient Active Problem List   Diagnosis Date Noted  . Impingement syndrome of left shoulder 09/30/2018  . Unspecified rotator cuff tear or rupture of right shoulder, not specified as traumatic 09/30/2018  .  Acromioclavicular joint arthritis 09/30/2018  . Sun-damaged skin 01/03/2018  . Morbid obesity ([place]) 11/05/2017  . Depression with anxiety 11/05/2017  . Nontraumatic incomplete tear of right rotator cuff 09/25/2016  . Foreign body of shoulder right  09/25/2016  . 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 Shoulder joint painful on movement, right 08/29/2016  . Epigastric pain 08/11/2015  . Chest pain, rule out acute myocardial infarction 08/11/2015  . Renal insufficiency 08/11/2015  . Venous stasis 04/13/2015  . Depression 04/13/2015  . Essential hypertension 09/12/2014  . PVCs (premature ventricular contractions) 09/07/2014  . Precordial pain 09/07/2014   Ailene Ravel, OTR/L,CBIS  [phone]  10/20/2018, 2:28 PM  [facility] [address] [place], [place], [address] Phone: [phone]   Fax:  [phone]  Name: ABENI FINCHUM MRN: [id number] Date of Birth: Jul 08, 1951

## 2018-10-23 ENCOUNTER — Encounter (HOSPITAL_COMMUNITY): Payer: Self-pay

## 2018-10-23 ENCOUNTER — Ambulatory Visit (HOSPITAL_COMMUNITY): Payer: 59

## 2018-10-23 DIAGNOSIS — M25612 Stiffness of left shoulder, not elsewhere classified: Secondary | ICD-10-CM | POA: Diagnosis not present

## 2018-10-23 DIAGNOSIS — M25512 Pain in left shoulder: Secondary | ICD-10-CM

## 2018-10-23 DIAGNOSIS — R29898 Other symptoms and signs involving the musculoskeletal system: Secondary | ICD-10-CM | POA: Diagnosis not present

## 2018-10-23 NOTE — Therapy (Signed)
[place], [place], [address] Phone: [phone]   Fax:  [phone]  Occupational Therapy Treatment  Patient Details  Name: BREELYN ICARD MRN: [id number] Date of Birth: November 05, 1950 Referring Provider (OT): Dr. Joni Fears   Encounter Date: 10/23/2018  OT End of Session - 10/23/18 1726    Visit Number  3    Number of Visits  16    Date for OT Re-Evaluation  12/14/18    Authorization Type  UMR    Authorization Time Period  25 visit limit, starts over on 10/29/18    Authorization - Visit Number  3    Authorization - Number of Visits  25    OT Start Time  1653   Pt was on phone   OT Stop Time  1730    OT Time Calculation (min)  37 min    Activity Tolerance  Patient tolerated treatment well    Behavior During Therapy  [facility] for tasks assessed/performed       Past Medical History:  Diagnosis Date  . Anxiety   . Arthritis   . Dyspnea   . Dysrhythmia   . Family history of anesthesia complication 25 yrs ago   Sister allergic to sccinocholine - unable to come off ventilator  . Hypertension   . Pneumonia   . PONV (postoperative nausea and vomiting)   . Premature ventricular contractions   . Shingles   . Varicose veins   . Varicose veins of left lower extremity     Past Surgical History:  Procedure Laterality Date  . CARPAL TUNNEL RELEASE    . CERVICAL DISC SURGERY  1992   Fusion  . COLONOSCOPY N/A 06/07/2016   Procedure: COLONOSCOPY;  Surgeon: Rogene Houston, MD;  [facility];  Service: Endoscopy;  Laterality: N/A;  1:00  . KNEE ARTHROSCOPY  09/16/2012   Procedure: ARTHROSCOPY KNEE;  Surgeon: [name], MD;  Location: [place];  Service: Orthopedics;  Laterality: Right;  Right Knee Arthroscopy  . SALPINGOOPHORECTOMY Left 1985  . SHOULDER ARTHROSCOPY WITH OPEN ROTATOR CUFF REPAIR AND DISTAL CLAVICLE ACROMINECTOMY Right 05/03/2016   Procedure: RIGHT SHOULDER ARTHROSCOPY WITH MINI-OPEN  ROTATOR CUFF REPAIR,DISTAL CLAVICLE RESECTION, SUBACROMIAL DECOMPRESSION.;  Surgeon: [name], MD;  Location: [place];  Service: Orthopedics;  Laterality: Right;  . SHOULDER ARTHROSCOPY WITH ROTATOR CUFF REPAIR Right 09/25/2016   Procedure: Right Shoulder Manipulation, Arthroscopic Debridement of Joint, Removal of Loose Body and Re-Repair of Rotator Cuff;  Surgeon: [name], MD;  Location: [place];  Service: Orthopedics;  Laterality: Right;    There were no vitals filed for this visit.  Subjective Assessment - 10/23/18 1716    Subjective   S: It is just really tender. Even though I'm in the sling it's still sore from my other arm doing things.     Currently in Pain?  Yes    Pain Score  5     Pain Location  Shoulder    Pain Orientation  Left    Pain Descriptors / Indicators  Tender    Pain Type  Acute pain    Pain Radiating Towards  elbow    Pain Onset  More than a month ago    Pain Frequency  Intermittent    Aggravating Factors   movement or change in position    Pain Relieving Factors  rest, ice, sling    Effect of Pain on Daily Activities  max  Multiple Pain Sites  No         [facility] OT Assessment - 10/23/18 1718      Assessment   Medical Diagnosis  S/P Left RCR, SAD/DCE      Precautions   Precautions  Shoulder    Type of Shoulder Precautions  Follow protocol from Dr. Durward Fortes    Shoulder Interventions  Shoulder sling/immobilizer               OT Treatments/Exercises (OP) - 10/23/18 1718      Exercises   Exercises  Shoulder      Shoulder Exercises: Supine   Protraction  PROM;AAROM;10 reps   therapist unweighted arm   Horizontal ABduction  PROM;10 reps    External Rotation  PROM;10 reps;Limitations    External Rotation Limitations  to 45 degrees    Internal Rotation  PROM;10 reps    Flexion  PROM;10 reps    ABduction  PROM;10 reps;Limitations    ABduction Limitations  to 90 degrees    Other Supine Exercises  Serratus  anterior punch; 10X; therapist unweighting arm      Shoulder Exercises: Seated   Extension  AROM;10 reps    Row  AROM;10 reps    Other Seated Exercises  scapular depression; A/ROM; 10X      Shoulder Exercises: Prone   Extension  AROM;10 reps   standing bent over     Shoulder Exercises: Therapy Ball   Flexion  Both   12X   ABduction  Both   12X   ABduction Limitations  at 90 degrees      Shoulder Exercises: ROM/Strengthening   Thumb Tacks  1' low level    Prot/Ret//Elev/Dep  1'      Shoulder Exercises: Isometric Strengthening   Extension  Supine;3X5"    Internal Rotation  Supine;3X5"      Manual Therapy   Manual Therapy  Myofascial release    Manual therapy comments  manual therapy completed seperately from all other interventions    Myofascial Release  myofascial release and manual stretching to left upper arm, scapular and shoulder region to decrease pain and restrictions and improve pain free mobility.                 OT Short Term Goals - 10/20/18 1342      OT SHORT TERM GOAL #1   Title  Patient will be educated on a HEP for left shoulder ROM and strength.    Time  4    Period  Weeks    Status  On-going      OT SHORT TERM GOAL #2   Title  Patient will improve left shoulder  P/ROM to [facility] in order to complete B/ADLs with increased independence.    Time  4    Period  Weeks    Status  On-going      OT SHORT TERM GOAL #3   Title  Patient will improve left shoulder and strength to 3+/5 for increased ability to pick up baskets of laundry.    Time  4    Period  Weeks    Status  On-going      OT SHORT TERM GOAL #4   Title  Patient will decrease left shoulder pain to 4/10 or better in order to complete B/ADLs with greater independence.    Time  4    Period  Weeks    Status  On-going      OT SHORT TERM GOAL #5  Title  Patient will decrease fascial restrictions to moderate amount in left shoulder region for greater mobility needed for daily tasks.     Time   4    Period  Weeks    Status  On-going        OT Long Term Goals - 10/20/18 1342      OT LONG TERM GOAL #1   Title  Patient will return to prior level of independence with all B/IADLs, work, and leisure activities using her left arm normally.     Time  8    Period  Weeks    Status  On-going      OT LONG TERM GOAL #2   Title  Patient will improve left shoulder A/ROM to WNL for greater ability to use left arm actively work and home.     Time  8    Period  Weeks    Status  On-going      OT LONG TERM GOAL #3   Title  Patient will improve left shoulder and elbow strength to 5/5 for greater ability to lift bags of mulch when completing yardwork.      Time  8    Period  Weeks    Status  On-going      OT LONG TERM GOAL #4   Title  Patient will decrease left shoulder pain to 2/10 or better when completing functional actiivites.     Time  8    Period  Weeks    Status  On-going      OT LONG TERM GOAL #5   Title  Paitent will decrease left fascial restrictions in left shoulder to trace amount in order to have greater mobility needed for use of left arm with functional activiites.      Time  8    Period  Weeks    Status  On-going            Plan - 10/23/18 1730    Clinical Impression Statement  A: Patient was able to tolerate further passive ROM during shoulder flexion this session. Minimal amount of fascial restrictions noted this date in anterior shoulder region. Manual techniques completed to address. VC for form and technique were provided. patient was education on shoulder distraction technique utilizing chair seat.     Plan  P: Progress to week 4. Attempt AA/ROM supine.     Consulted and Agree with Plan of Care  Patient       Patient will benefit from skilled therapeutic intervention in order to improve the following deficits and impairments:  Decreased range of motion, Impaired flexibility, Increased muscle spasms, Impaired UE functional use, Pain, Decreased  strength  Visit Diagnosis: Stiffness of left shoulder, not elsewhere classified  Acute pain of left shoulder  Other symptoms and signs involving the musculoskeletal system    Problem List Patient Active Problem List   Diagnosis Date Noted  . Impingement syndrome of left shoulder 09/30/2018  . Unspecified rotator cuff tear or rupture of right shoulder, not specified as traumatic 09/30/2018  . Acromioclavicular joint arthritis 09/30/2018  . Sun-damaged skin 01/03/2018  . Morbid obesity ([place]) 11/05/2017  . Depression with anxiety 11/05/2017  . Nontraumatic incomplete tear of right rotator cuff 09/25/2016  . Foreign body of shoulder right  09/25/2016  . 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 Shoulder joint painful on movement, right 08/29/2016  . Epigastric pain 08/11/2015  . Chest pain, rule out acute myocardial infarction 08/11/2015  . Renal  insufficiency 08/11/2015  . Venous stasis 04/13/2015  . Depression 04/13/2015  . Essential hypertension 09/12/2014  . PVCs (premature ventricular contractions) 09/07/2014  . Precordial pain 09/07/2014   Ailene Ravel, OTR/L,CBIS  [phone]  10/23/2018, 6:00 PM  [facility] [address] [place], [place], [address] Phone: [phone]   Fax:  [phone]  Name: MADILYNN MONTANTE MRN: [id number] Date of Birth: Mar 04, 1951

## 2018-10-27 ENCOUNTER — Encounter (HOSPITAL_COMMUNITY): Payer: Self-pay

## 2018-10-27 ENCOUNTER — Ambulatory Visit (HOSPITAL_COMMUNITY): Payer: 59

## 2018-10-27 DIAGNOSIS — R29898 Other symptoms and signs involving the musculoskeletal system: Secondary | ICD-10-CM

## 2018-10-27 DIAGNOSIS — M25612 Stiffness of left shoulder, not elsewhere classified: Secondary | ICD-10-CM | POA: Diagnosis not present

## 2018-10-27 DIAGNOSIS — M25512 Pain in left shoulder: Secondary | ICD-10-CM | POA: Diagnosis not present

## 2018-10-27 NOTE — Patient Instructions (Signed)
Perform each exercise _____10-15___ reps. 2-3x days.   Protraction -   Start by holding a wand or cane at chest height.  Next, slowly push the wand outwards in front of your body so that your elbows become fully straightened. Then, return to the original position.     Shoulder FLEXION - PALMS DOWN  In the standing position, hold a wand/cane with both arms, palms down on both sides. Raise up the wand/cane allowing your unaffected arm to perform most of the effort. Your affected arm should be partially relaxed.      Internal/External ROTATION -   In the standing position, hold a wand/cane with both hands keeping your elbows bent. Move your arms and wand/cane to one side.  Your affected arm should be partially relaxed while your unaffected arm performs most of the effort.       Shoulder ABDUCTION -   While holding a wand/cane palm face up on the injured side and palm face down on the uninjured side, slowly raise up your injured arm to the side.                     Horizontal Abduction/Adduction      Straight arms holding cane at shoulder height, bring cane to right, center, left. Repeat starting to left.   Copyright  VHI. All rights reserved.

## 2018-10-27 NOTE — Therapy (Signed)
[place], [place], [address] Phone: [phone]   Fax:  [phone]  Occupational Therapy Treatment  Patient Details  Name: NATALYIA INNES MRN: [id number] Date of Birth: Dec 08, 1950 Referring Provider (OT): Dr. Joni Fears   Encounter Date: 10/27/2018  OT End of Session - 10/27/18 1502    Visit Number  4    Number of Visits  16    Date for OT Re-Evaluation  12/14/18    Authorization Type  UMR    Authorization Time Period  25 visit limit, starts over on 10/29/18    Authorization - Visit Number  4    Authorization - Number of Visits  25    OT Start Time  1440    OT Stop Time  1518    OT Time Calculation (min)  38 min    Activity Tolerance  Patient tolerated treatment well    Behavior During Therapy  [facility] for tasks assessed/performed       Past Medical History:  Diagnosis Date  . Anxiety   . Arthritis   . Dyspnea   . Dysrhythmia   . Family history of anesthesia complication 25 yrs ago   Sister allergic to sccinocholine - unable to come off ventilator  . Hypertension   . Pneumonia   . PONV (postoperative nausea and vomiting)   . Premature ventricular contractions   . Shingles   . Varicose veins   . Varicose veins of left lower extremity     Past Surgical History:  Procedure Laterality Date  . CARPAL TUNNEL RELEASE    . CERVICAL DISC SURGERY  1992   Fusion  . COLONOSCOPY N/A 06/07/2016   Procedure: COLONOSCOPY;  Surgeon: Rogene Houston, MD;  [facility];  Service: Endoscopy;  Laterality: N/A;  1:00  . KNEE ARTHROSCOPY  09/16/2012   Procedure: ARTHROSCOPY KNEE;  Surgeon: [name], MD;  Location: [place];  Service: Orthopedics;  Laterality: Right;  Right Knee Arthroscopy  . SALPINGOOPHORECTOMY Left 1985  . SHOULDER ARTHROSCOPY WITH OPEN ROTATOR CUFF REPAIR AND DISTAL CLAVICLE ACROMINECTOMY Right 05/03/2016   Procedure: RIGHT SHOULDER ARTHROSCOPY WITH MINI-OPEN ROTATOR CUFF  REPAIR,DISTAL CLAVICLE RESECTION, SUBACROMIAL DECOMPRESSION.;  Surgeon: [name], MD;  Location: [place];  Service: Orthopedics;  Laterality: Right;  . SHOULDER ARTHROSCOPY WITH ROTATOR CUFF REPAIR Right 09/25/2016   Procedure: Right Shoulder Manipulation, Arthroscopic Debridement of Joint, Removal of Loose Body and Re-Repair of Rotator Cuff;  Surgeon: [name], MD;  Location: [place];  Service: Orthopedics;  Laterality: Right;    There were no vitals filed for this visit.  Subjective Assessment - 10/27/18 1459    Subjective   S: It's just really tender in the front.     Currently in Pain?  Yes    Pain Score  5     Pain Location  Shoulder    Pain Orientation  Left    Pain Descriptors / Indicators  Tender    Pain Type  Acute pain                   OT Treatments/Exercises (OP) - 10/27/18 0001      Exercises   Exercises  Shoulder      Shoulder Exercises: Supine   Protraction  PROM;AAROM;5 reps    Horizontal ABduction  PROM;AAROM;5 reps    External Rotation  PROM;5 reps;AAROM;10 reps    Internal Rotation  PROM;5 reps;AAROM;10 reps  Flexion  PROM;5 reps;AAROM;10 reps    ABduction  PROM;5 reps      Shoulder Exercises: Seated   Row  AROM;12 reps      Shoulder Exercises: Prone   Extension  AROM;12 reps   standing bent over     Shoulder Exercises: Therapy Ball   Flexion  Both   12X   ABduction  Both   12X     Shoulder Exercises: Isometric Strengthening   Flexion  Supine   1x30"   Extension  Supine   1x30"   External Rotation  Supine   1x30"   Internal Rotation  Supine   1x30"   ABduction  Supine   1x30"     Manual Therapy   Manual Therapy  Myofascial release    Manual therapy comments  manual therapy completed seperately from all other interventions    Myofascial Release  myofascial release and manual stretching to left upper arm, scapular and shoulder region to decrease pain and restrictions and improve pain free  mobility.               OT Education - 10/27/18 1502    Education Details  AA/ROM shoulder exercises supine- supine only. No abduction for now.     Person(s) Educated  Patient    Methods  Explanation;Verbal cues;Demonstration;Handout    Comprehension  Verbalized understanding;Returned demonstration       OT Short Term Goals - 10/20/18 1342      OT SHORT TERM GOAL #1   Title  Patient will be educated on a HEP for left shoulder ROM and strength.    Time  4    Period  Weeks    Status  On-going      OT SHORT TERM GOAL #2   Title  Patient will improve left shoulder  P/ROM to [facility] in order to complete B/ADLs with increased independence.    Time  4    Period  Weeks    Status  On-going      OT SHORT TERM GOAL #3   Title  Patient will improve left shoulder and strength to 3+/5 for increased ability to pick up baskets of laundry.    Time  4    Period  Weeks    Status  On-going      OT SHORT TERM GOAL #4   Title  Patient will decrease left shoulder pain to 4/10 or better in order to complete B/ADLs with greater independence.    Time  4    Period  Weeks    Status  On-going      OT SHORT TERM GOAL #5   Title  Patient will decrease fascial restrictions to moderate amount in left shoulder region for greater mobility needed for daily tasks.     Time  4    Period  Weeks    Status  On-going        OT Long Term Goals - 10/20/18 1342      OT LONG TERM GOAL #1   Title  Patient will return to prior level of independence with all B/IADLs, work, and leisure activities using her left arm normally.     Time  8    Period  Weeks    Status  On-going      OT LONG TERM GOAL #2   Title  Patient will improve left shoulder A/ROM to WNL for greater ability to use left arm actively work and home.     Time  8    Period  Weeks    Status  On-going      OT LONG TERM GOAL #3   Title  Patient will improve left shoulder and elbow strength to 5/5 for greater ability to lift bags of mulch when  completing yardwork.      Time  8    Period  Weeks    Status  On-going      OT LONG TERM GOAL #4   Title  Patient will decrease left shoulder pain to 2/10 or better when completing functional actiivites.     Time  8    Period  Weeks    Status  On-going      OT LONG TERM GOAL #5   Title  Paitent will decrease left fascial restrictions in left shoulder to trace amount in order to have greater mobility needed for use of left arm with functional activiites.      Time  8    Period  Weeks    Status  On-going            Plan - 10/27/18 1536    Clinical Impression Statement  A: Progressed to AA/ROM supine this session with the exception of abduction as patient does not have the strength to complete movement with proper form or technique. Patient reports tenderness in anterior portion of shoulder with manual techniques completed to address.     Plan  P: continue to follow week 4 of protocol. Add yellow theraband for extension, retraction, and IR/er.    Consulted and Agree with Plan of Care  Patient       Patient will benefit from skilled therapeutic intervention in order to improve the following deficits and impairments:  Decreased range of motion, Impaired flexibility, Increased muscle spasms, Impaired UE functional use, Pain, Decreased strength  Visit Diagnosis: Stiffness of left shoulder, not elsewhere classified  Acute pain of left shoulder  Other symptoms and signs involving the musculoskeletal system    Problem List Patient Active Problem List   Diagnosis Date Noted  . Impingement syndrome of left shoulder 09/30/2018  . Unspecified rotator cuff tear or rupture of right shoulder, not specified as traumatic 09/30/2018  . Acromioclavicular joint arthritis 09/30/2018  . Sun-damaged skin 01/03/2018  . Morbid obesity ([place]) 11/05/2017  . Depression with anxiety 11/05/2017  . Nontraumatic incomplete tear of right rotator cuff 09/25/2016  . Foreign body of shoulder right   09/25/2016  . 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 Shoulder joint painful on movement, right 08/29/2016  . Epigastric pain 08/11/2015  . Chest pain, rule out acute myocardial infarction 08/11/2015  . Renal insufficiency 08/11/2015  . Venous stasis 04/13/2015  . Depression 04/13/2015  . Essential hypertension 09/12/2014  . PVCs (premature ventricular contractions) 09/07/2014  . Precordial pain 09/07/2014   Ailene Ravel, OTR/L,CBIS  [phone]  10/27/2018, 3:41 PM  [facility] [address] [place], [place], [address] Phone: [phone]   Fax:  [phone]  Name: ARMILDA VANDERLINDEN MRN: [id number] Date of Birth: 1951-10-29

## 2018-10-28 ENCOUNTER — Ambulatory Visit (HOSPITAL_COMMUNITY): Payer: 59 | Admitting: Occupational Therapy

## 2018-10-28 ENCOUNTER — Encounter (HOSPITAL_COMMUNITY): Payer: Self-pay | Admitting: Occupational Therapy

## 2018-10-28 DIAGNOSIS — R29898 Other symptoms and signs involving the musculoskeletal system: Secondary | ICD-10-CM | POA: Diagnosis not present

## 2018-10-28 DIAGNOSIS — M25612 Stiffness of left shoulder, not elsewhere classified: Secondary | ICD-10-CM

## 2018-10-28 DIAGNOSIS — M25512 Pain in left shoulder: Secondary | ICD-10-CM

## 2018-10-28 NOTE — Therapy (Signed)
[place], [place], [address] Phone: [phone]   Fax:  [phone]  Occupational Therapy Treatment  Patient Details  Name: Jennifer Coleman MRN: [id number] Date of Birth: 1951/09/17 Referring Provider (OT): Dr. Joni Fears   Encounter Date: 10/28/2018  OT End of Session - 10/28/18 1209    Visit Number  5    Number of Visits  16    Date for OT Re-Evaluation  12/14/18    Authorization Type  UMR    Authorization Time Period  25 visit limit, starts over on 10/29/18    Authorization - Visit Number  5    Authorization - Number of Visits  25    OT Start Time  1740   pt arrived late   OT Stop Time  1206    OT Time Calculation (min)  35 min    Activity Tolerance  Patient tolerated treatment well    Behavior During Therapy  [facility] for tasks assessed/performed       Past Medical History:  Diagnosis Date  . Anxiety   . Arthritis   . Dyspnea   . Dysrhythmia   . Family history of anesthesia complication 25 yrs ago   Sister allergic to sccinocholine - unable to come off ventilator  . Hypertension   . Pneumonia   . PONV (postoperative nausea and vomiting)   . Premature ventricular contractions   . Shingles   . Varicose veins   . Varicose veins of left lower extremity     Past Surgical History:  Procedure Laterality Date  . CARPAL TUNNEL RELEASE    . CERVICAL DISC SURGERY  1992   Fusion  . COLONOSCOPY N/A 06/07/2016   Procedure: COLONOSCOPY;  Surgeon: Rogene Houston, MD;  [facility];  Service: Endoscopy;  Laterality: N/A;  1:00  . KNEE ARTHROSCOPY  09/16/2012   Procedure: ARTHROSCOPY KNEE;  Surgeon: [name], MD;  Location: [place];  Service: Orthopedics;  Laterality: Right;  Right Knee Arthroscopy  . SALPINGOOPHORECTOMY Left 1985  . SHOULDER ARTHROSCOPY WITH OPEN ROTATOR CUFF REPAIR AND DISTAL CLAVICLE ACROMINECTOMY Right 05/03/2016   Procedure: RIGHT SHOULDER ARTHROSCOPY WITH MINI-OPEN  ROTATOR CUFF REPAIR,DISTAL CLAVICLE RESECTION, SUBACROMIAL DECOMPRESSION.;  Surgeon: [name], MD;  Location: [place];  Service: Orthopedics;  Laterality: Right;  . SHOULDER ARTHROSCOPY WITH ROTATOR CUFF REPAIR Right 09/25/2016   Procedure: Right Shoulder Manipulation, Arthroscopic Debridement of Joint, Removal of Loose Body and Re-Repair of Rotator Cuff;  Surgeon: [name], MD;  Location: [place];  Service: Orthopedics;  Laterality: Right;    There were no vitals filed for this visit.  Subjective Assessment - 10/28/18 1132    Subjective   S: It's still just tender.     Currently in Pain?  Yes    Pain Score  4     Pain Location  Shoulder    Pain Orientation  Left    Pain Descriptors / Indicators  Tender    Pain Type  Acute pain    Pain Radiating Towards  elbow    Pain Onset  More than a month ago    Pain Frequency  Intermittent    Aggravating Factors   movement    Pain Relieving Factors  rest, ice, sling    Effect of Pain on Daily Activities  max effect on ADLs    Multiple Pain Sites  No         [facility] OT  Assessment - 10/28/18 1132      Assessment   Medical Diagnosis  S/P Left RCR, SAD/DCE      Precautions   Precautions  Shoulder    Type of Shoulder Precautions  Follow protocol from Dr. Durward Fortes    Shoulder Interventions  Shoulder sling/immobilizer               OT Treatments/Exercises (OP) - 10/28/18 1135      Exercises   Exercises  Shoulder      Shoulder Exercises: Supine   Protraction  PROM;5 reps;AAROM;10 reps    Horizontal ABduction  PROM;5 reps;AAROM;10 reps    External Rotation  PROM;5 reps;AAROM;10 reps    Internal Rotation  PROM;5 reps;AAROM;10 reps    Flexion  PROM;5 reps;AAROM;10 reps    ABduction  PROM;AAROM;5 reps      Shoulder Exercises: Seated   Extension  Theraband;10 reps    Theraband Level (Shoulder Extension)  Level 1 (Yellow)    External Rotation  Theraband;10 reps    Theraband Level (Shoulder  External Rotation)  Level 1 (Yellow)    Internal Rotation  Theraband;10 reps    Theraband Level (Shoulder Internal Rotation)  Level 1 (Yellow)      Shoulder Exercises: Isometric Strengthening   Flexion  Supine   1X30"   Extension  Supine   1X30"   External Rotation  Supine   1X30"   Internal Rotation  Supine   1X30"   ABduction  Supine   1X30"              OT Short Term Goals - 10/20/18 1342      OT SHORT TERM GOAL #1   Title  Patient will be educated on a HEP for left shoulder ROM and strength.    Time  4    Period  Weeks    Status  On-going      OT SHORT TERM GOAL #2   Title  Patient will improve left shoulder  P/ROM to [facility] in order to complete B/ADLs with increased independence.    Time  4    Period  Weeks    Status  On-going      OT SHORT TERM GOAL #3   Title  Patient will improve left shoulder and strength to 3+/5 for increased ability to pick up baskets of laundry.    Time  4    Period  Weeks    Status  On-going      OT SHORT TERM GOAL #4   Title  Patient will decrease left shoulder pain to 4/10 or better in order to complete B/ADLs with greater independence.    Time  4    Period  Weeks    Status  On-going      OT SHORT TERM GOAL #5   Title  Patient will decrease fascial restrictions to moderate amount in left shoulder region for greater mobility needed for daily tasks.     Time  4    Period  Weeks    Status  On-going        OT Long Term Goals - 10/20/18 1342      OT LONG TERM GOAL #1   Title  Patient will return to prior level of independence with all B/IADLs, work, and leisure activities using her left arm normally.     Time  8    Period  Weeks    Status  On-going      OT LONG TERM GOAL #2  Title  Patient will improve left shoulder A/ROM to WNL for greater ability to use left arm actively work and home.     Time  8    Period  Weeks    Status  On-going      OT LONG TERM GOAL #3   Title  Patient will improve left shoulder and elbow  strength to 5/5 for greater ability to lift bags of mulch when completing yardwork.      Time  8    Period  Weeks    Status  On-going      OT LONG TERM GOAL #4   Title  Patient will decrease left shoulder pain to 2/10 or better when completing functional actiivites.     Time  8    Period  Weeks    Status  On-going      OT LONG TERM GOAL #5   Title  Paitent will decrease left fascial restrictions in left shoulder to trace amount in order to have greater mobility needed for use of left arm with functional activiites.      Time  8    Period  Weeks    Status  On-going            Plan - 10/28/18 1209    Clinical Impression Statement  A: Continued with week 4 protocol today, no manual therapy completed as pt arrived to session late. Pt able to increase to 10 repetitions for protraction and flexion, able to complete abduction with OT min guard support at elbow. Added yellow theraband extension, er/IR, unable to add retraction due to pt weakness and current mobility limitations. Verbal cuing for form and technique during session.     Plan  P: continue with protocol, resume missed exercises, update HEP for yellow theraband if pt able to complete with good form       Patient will benefit from skilled therapeutic intervention in order to improve the following deficits and impairments:  Decreased range of motion, Impaired flexibility, Increased muscle spasms, Impaired UE functional use, Pain, Decreased strength  Visit Diagnosis: Stiffness of left shoulder, not elsewhere classified  Acute pain of left shoulder  Other symptoms and signs involving the musculoskeletal system    Problem List Patient Active Problem List   Diagnosis Date Noted  . Impingement syndrome of left shoulder 09/30/2018  . Unspecified rotator cuff tear or rupture of right shoulder, not specified as traumatic 09/30/2018  . Acromioclavicular joint arthritis 09/30/2018  . Sun-damaged skin 01/03/2018  . Morbid  obesity ([place]) 11/05/2017  . Depression with anxiety 11/05/2017  . Nontraumatic incomplete tear of right rotator cuff 09/25/2016  . Foreign body of shoulder right  09/25/2016  . 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 Shoulder joint painful on movement, right 08/29/2016  . Epigastric pain 08/11/2015  . Chest pain, rule out acute myocardial infarction 08/11/2015  . Renal insufficiency 08/11/2015  . Venous stasis 04/13/2015  . Depression 04/13/2015  . Essential hypertension 09/12/2014  . PVCs (premature ventricular contractions) 09/07/2014  . Precordial pain 09/07/2014   Guadelupe Sabin, OTR/L  [phone] 10/28/2018, 12:11 PM  [facility] [address] [place], [place], [address] Phone: [phone]   Fax:  [phone]  Name: Jennifer Coleman MRN: [id number] Date of Birth: 15-Feb-1951

## 2018-11-03 ENCOUNTER — Ambulatory Visit (HOSPITAL_COMMUNITY): Payer: 59 | Attending: Orthopaedic Surgery | Admitting: Occupational Therapy

## 2018-11-03 ENCOUNTER — Encounter (HOSPITAL_COMMUNITY): Payer: Self-pay | Admitting: Occupational Therapy

## 2018-11-03 DIAGNOSIS — M25612 Stiffness of left shoulder, not elsewhere classified: Secondary | ICD-10-CM | POA: Insufficient documentation

## 2018-11-03 DIAGNOSIS — M25512 Pain in left shoulder: Secondary | ICD-10-CM | POA: Diagnosis not present

## 2018-11-03 DIAGNOSIS — R29898 Other symptoms and signs involving the musculoskeletal system: Secondary | ICD-10-CM | POA: Diagnosis not present

## 2018-11-03 MED FILL — ENALAPRIL MALEATE 20 MG TAB: 20 | 90 days supply | Qty: 180 | Fill #1

## 2018-11-03 NOTE — Therapy (Signed)
[place], [place], [address] Phone: [phone]   Fax:  [phone]  Occupational Therapy Treatment  Patient Details  Name: Jennifer Coleman MRN: [id number] Date of Birth: [birth year] Referring Provider (OT): Dr. Joni Fears   Encounter Date: 11/03/2018  OT End of Session - 11/03/18 1345    Visit Number  6    Number of Visits  16    Date for OT Re-Evaluation  12/14/18    Authorization Type  UMR    Authorization Time Period  25 visit limit, starts over on 10/29/18    Authorization - Visit Number  6    Authorization - Number of Visits  25    OT Start Time  5732    OT Stop Time  1343    OT Time Calculation (min)  38 min    Activity Tolerance  Patient tolerated treatment well    Behavior During Therapy  [facility] for tasks assessed/performed       Past Medical History:  Diagnosis Date  . Anxiety   . Arthritis   . Dyspnea   . Dysrhythmia   . Family history of anesthesia complication 25 yrs ago   Sister allergic to sccinocholine - unable to come off ventilator  . Hypertension   . Pneumonia   . PONV (postoperative nausea and vomiting)   . Premature ventricular contractions   . Shingles   . Varicose veins   . Varicose veins of left lower extremity     Past Surgical History:  Procedure Laterality Date  . CARPAL TUNNEL RELEASE    . CERVICAL DISC SURGERY  1992   Fusion  . COLONOSCOPY N/A 06/07/2016   Procedure: COLONOSCOPY;  Surgeon: Rogene Houston, MD;  [facility];  Service: Endoscopy;  Laterality: N/A;  1:00  . KNEE ARTHROSCOPY  09/16/2012   Procedure: ARTHROSCOPY KNEE;  Surgeon: [name], MD;  Location: [place];  Service: Orthopedics;  Laterality: Right;  Right Knee Arthroscopy  . SALPINGOOPHORECTOMY Left 1985  . SHOULDER ARTHROSCOPY WITH OPEN ROTATOR CUFF REPAIR AND DISTAL CLAVICLE ACROMINECTOMY Right 05/03/2016   Procedure: RIGHT SHOULDER ARTHROSCOPY WITH MINI-OPEN ROTATOR CUFF REPAIR,DISTAL  CLAVICLE RESECTION, SUBACROMIAL DECOMPRESSION.;  Surgeon: [name], MD;  Location: [place];  Service: Orthopedics;  Laterality: Right;  . SHOULDER ARTHROSCOPY WITH ROTATOR CUFF REPAIR Right 09/25/2016   Procedure: Right Shoulder Manipulation, Arthroscopic Debridement of Joint, Removal of Loose Body and Re-Repair of Rotator Cuff;  Surgeon: [name], MD;  Location: [place];  Service: Orthopedics;  Laterality: Right;    There were no vitals filed for this visit.  Subjective Assessment - 11/03/18 1307    Subjective   S: It's been cramping a little bit.     Currently in Pain?  No/denies         [facility] OT Assessment - 11/03/18 1306      Assessment   Medical Diagnosis  S/P Left RCR, SAD/DCE      Precautions   Precautions  Shoulder    Type of Shoulder Precautions  Follow protocol from Dr. Durward Fortes    Shoulder Interventions  Shoulder sling/immobilizer      AROM   Overall AROM Comments  Assessed seated, er/IR adducted    AROM Assessment Site  Shoulder    Right/Left Shoulder  Left    Left Shoulder Flexion  61 Degrees   not previously assessed   Left Shoulder ABduction  40 Degrees   not  previously assessed   Left Shoulder Internal Rotation  90 Degrees   not previously assessed   Left Shoulder External Rotation  36 Degrees   not previously assessed     PROM   Overall PROM Comments  assessed in supine, external and internal rotation with shoulder adducted    PROM Assessment Site  Shoulder    Right/Left Shoulder  Left    Left Shoulder Flexion  125 Degrees   100 previous   Left Shoulder ABduction  105 Degrees   55 previous   Left Shoulder Internal Rotation  90 Degrees   75 previous   Left Shoulder External Rotation  50 Degrees   0 previous              OT Treatments/Exercises (OP) - 11/03/18 1307      Exercises   Exercises  Shoulder      Shoulder Exercises: Supine   Protraction  PROM;5 reps;AAROM;10 reps    Horizontal ABduction   PROM;5 reps;AAROM;10 reps    External Rotation  PROM;5 reps;AAROM;10 reps    Internal Rotation  PROM;5 reps;AAROM;10 reps    Flexion  PROM;5 reps;AAROM;10 reps    ABduction  PROM;AAROM;5 reps      Shoulder Exercises: Seated   Protraction  AAROM;5 reps    External Rotation  AAROM;5 reps    Internal Rotation  AAROM;5 reps    Flexion  AAROM;5 reps      Manual Therapy   Manual Therapy  Myofascial release    Manual therapy comments  manual therapy completed seperately from all other interventions    Myofascial Release  myofascial release and manual stretching to left upper arm, scapular and shoulder region to decrease pain and restrictions and improve pain free mobility.                 OT Short Term Goals - 10/20/18 1342      OT SHORT TERM GOAL #1   Title  Patient will be educated on a HEP for left shoulder ROM and strength.    Time  4    Period  Weeks    Status  On-going      OT SHORT TERM GOAL #2   Title  Patient will improve left shoulder  P/ROM to [facility] in order to complete B/ADLs with increased independence.    Time  4    Period  Weeks    Status  On-going      OT SHORT TERM GOAL #3   Title  Patient will improve left shoulder and strength to 3+/5 for increased ability to pick up baskets of laundry.    Time  4    Period  Weeks    Status  On-going      OT SHORT TERM GOAL #4   Title  Patient will decrease left shoulder pain to 4/10 or better in order to complete B/ADLs with greater independence.    Time  4    Period  Weeks    Status  On-going      OT SHORT TERM GOAL #5   Title  Patient will decrease fascial restrictions to moderate amount in left shoulder region for greater mobility needed for daily tasks.     Time  4    Period  Weeks    Status  On-going        OT Long Term Goals - 10/20/18 1342      OT LONG TERM GOAL #1   Title  Patient will return  to prior level of independence with all B/IADLs, work, and leisure activities using her left arm normally.      Time  8    Period  Weeks    Status  On-going      OT LONG TERM GOAL #2   Title  Patient will improve left shoulder A/ROM to WNL for greater ability to use left arm actively work and home.     Time  8    Period  Weeks    Status  On-going      OT LONG TERM GOAL #3   Title  Patient will improve left shoulder and elbow strength to 5/5 for greater ability to lift bags of mulch when completing yardwork.      Time  8    Period  Weeks    Status  On-going      OT LONG TERM GOAL #4   Title  Patient will decrease left shoulder pain to 2/10 or better when completing functional actiivites.     Time  8    Period  Weeks    Status  On-going      OT LONG TERM GOAL #5   Title  Paitent will decrease left fascial restrictions in left shoulder to trace amount in order to have greater mobility needed for use of left arm with functional activiites.      Time  8    Period  Weeks    Status  On-going            Plan - 11/03/18 1345    Clinical Impression Statement  A: Progressed to week 5 of protocol today, adding AA/ROM in sitting with exception of horizontal abduction. Measurements taken for MD appt on Wednesday, pt has improved with ROM. Pt reports more discomfort when bringing arm back down versus when activating muscle to flex arm. Verbal cuing for form and technique during exercises.     Plan  P: Follow up on HEP, continue working to improve ROM during AA/ROM, resume missed exercises and update HEP for yellow theraband       Patient will benefit from skilled therapeutic intervention in order to improve the following deficits and impairments:  Decreased range of motion, Impaired flexibility, Increased muscle spasms, Impaired UE functional use, Pain, Decreased strength  Visit Diagnosis: Stiffness of left shoulder, not elsewhere classified  Acute pain of left shoulder  Other symptoms and signs involving the musculoskeletal system    Problem List Patient Active Problem List    Diagnosis Date Noted  . Impingement syndrome of left shoulder 09/30/2018  . Unspecified rotator cuff tear or rupture of right shoulder, not specified as traumatic 09/30/2018  . Acromioclavicular joint arthritis 09/30/2018  . Sun-damaged skin 01/03/2018  . Morbid obesity ([place]) 11/05/2017  . Depression with anxiety 11/05/2017  . Nontraumatic incomplete tear of right rotator cuff 09/25/2016  . Foreign body of shoulder right  09/25/2016  . 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 Shoulder joint painful on movement, right 08/29/2016  . Epigastric pain 08/11/2015  . Chest pain, rule out acute myocardial infarction 08/11/2015  . Renal insufficiency 08/11/2015  . Venous stasis 04/13/2015  . Depression 04/13/2015  . Essential hypertension 09/12/2014  . PVCs (premature ventricular contractions) 09/07/2014  . Precordial pain 09/07/2014   Guadelupe Sabin, OTR/L  [phone] 11/03/2018, 2:03 PM  [facility] [address] [place], [place], [address] Phone: [phone]   Fax:  [phone]  Name: Jennifer Coleman MRN:  [id number] Date of Birth: May 29, 1951

## 2018-11-05 ENCOUNTER — Ambulatory Visit (INDEPENDENT_AMBULATORY_CARE_PROVIDER_SITE_OTHER): Payer: 59 | Admitting: Orthopaedic Surgery

## 2018-11-05 ENCOUNTER — Encounter (INDEPENDENT_AMBULATORY_CARE_PROVIDER_SITE_OTHER): Payer: Self-pay | Admitting: Orthopaedic Surgery

## 2018-11-05 VITALS — BP 126/64 | HR 69 | Ht 65.0 in | Wt 240.0 lb

## 2018-11-05 DIAGNOSIS — G8929 Other chronic pain: Secondary | ICD-10-CM

## 2018-11-05 DIAGNOSIS — M25512 Pain in left shoulder: Secondary | ICD-10-CM

## 2018-11-05 NOTE — Progress Notes (Signed)
Office Visit Note   Patient: Jennifer Coleman           Date of Birth: 04-18-1951           MRN: [id number] Visit Date: 11/05/2018              Requested by: Mikey Kirschner, [place], [place] [address] PCP: Mikey Kirschner, MD   Assessment & Plan: Visit Diagnoses:  1. Chronic left shoulder pain     Plan: 5 weeks status post left shoulder arthroscopic SCD, DCR and mini open rotator cuff tear repair.  Still using a sling and will for the next 2 weeks.  Going to physical therapy with progressive motion.  Still a little bit tight and needs to work on her exercises at home to prevent adhesive capsulitis.  Will check again in 1 month.  Unable to work until she is reevaluated. note given to that effect  Follow-Up Instructions: Return in about 1 month (around 12/06/2018).   Orders:  No orders of the defined types were placed in this encounter.  No orders of the defined types were placed in this encounter.     Procedures: No procedures performed   Clinical Data: No additional findings.   Subjective: Chief Complaint  Patient presents with  . Left Shoulder - Follow-up    09/30/18 Left shoulder arthroscopy, SAD, DCE, mini open rotator cuff repair  Patient returns for follow up. She is status post left shoulder arthroscopy, SAD, DCE, and mini open rotator cuff repair on 09/30/18.  She states that the shoulder continues to get achy and tight at moments. She is going to physical therapy and doing home exercises which she states has helped increase range of motion and decrease pain. She continues to use ice and takes ibuprofen as needed for pain.  HPI  Review of Systems   Objective: Vital Signs: BP 126/64   Pulse 69   Ht '5\' 5"'$  (1.651 m)   Wt 240 lb (108.9 kg)   BMI 39.94 kg/m   Physical Exam  Ortho Exam left shoulder incisions healing without problem.  I can abduct easily to 90 degrees almost fully place her arm overhead passively.  She is a little bit  tight.  Good grip and good release and no distal edema.  Specialty Comments:  No specialty comments available.  Imaging: No results found.   PMFS History: Patient Active Problem List   Diagnosis Date Noted  . Impingement syndrome of left shoulder 09/30/2018  . Unspecified rotator cuff tear or rupture of right shoulder, not specified as traumatic 09/30/2018  . Acromioclavicular joint arthritis 09/30/2018  . Sun-damaged skin 01/03/2018  . Morbid obesity ([place]) 11/05/2017  . Depression with anxiety 11/05/2017  . Nontraumatic incomplete tear of right rotator cuff 09/25/2016  . Foreign body of shoulder right  09/25/2016  . 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 Shoulder joint painful on movement, right 08/29/2016  . Epigastric pain 08/11/2015  . Chest pain, rule out acute myocardial infarction 08/11/2015  . Renal insufficiency 08/11/2015  . Venous stasis 04/13/2015  . Depression 04/13/2015  . Essential hypertension 09/12/2014  . PVCs (premature ventricular contractions) 09/07/2014  . Precordial pain 09/07/2014   Past Medical History:  Diagnosis Date  . Anxiety   . Arthritis   . Dyspnea   . Dysrhythmia   . Family history of anesthesia complication 25 yrs ago   Sister allergic to sccinocholine - unable to come off ventilator  .  Hypertension   . Pneumonia   . PONV (postoperative nausea and vomiting)   . Premature ventricular contractions   . Shingles   . Varicose veins   . Varicose veins of left lower extremity     Family History  Problem Relation Age of Onset  . Heart disease Sister   . Heart disease Brother        82's  . Diabetes Mellitus II Sister   . Multiple myeloma Mother   . Hypertension Sister   . Hypertension Father   . Heart attack Father     Past Surgical History:  Procedure Laterality Date  . CARPAL TUNNEL RELEASE    . CERVICAL DISC SURGERY  1992   Fusion  . COLONOSCOPY N/A 06/07/2016   Procedure: COLONOSCOPY;  Surgeon: Rogene Houston,  MD;  [facility];  Service: Endoscopy;  Laterality: N/A;  1:00  . KNEE ARTHROSCOPY  09/16/2012   Procedure: ARTHROSCOPY KNEE;  Surgeon: [name], MD;  Location: [place];  Service: Orthopedics;  Laterality: Right;  Right Knee Arthroscopy  . SALPINGOOPHORECTOMY Left 1985  . SHOULDER ARTHROSCOPY WITH OPEN ROTATOR CUFF REPAIR AND DISTAL CLAVICLE ACROMINECTOMY Right 05/03/2016   Procedure: RIGHT SHOULDER ARTHROSCOPY WITH MINI-OPEN ROTATOR CUFF REPAIR,DISTAL CLAVICLE RESECTION, SUBACROMIAL DECOMPRESSION.;  Surgeon: [name], MD;  Location: [place];  Service: Orthopedics;  Laterality: Right;  . SHOULDER ARTHROSCOPY WITH ROTATOR CUFF REPAIR Right 09/25/2016   Procedure: Right Shoulder Manipulation, Arthroscopic Debridement of Joint, Removal of Loose Body and Re-Repair of Rotator Cuff;  Surgeon: [name], MD;  Location: [place];  Service: Orthopedics;  Laterality: Right;   Social History   Occupational History  . Not on file  Tobacco Use  . Smoking status: Former Smoker    Types: Cigarettes    Last attempt to quit: 09/22/1992    Years since quitting: 26.1  . Smokeless tobacco: Never Used  Substance and Sexual Activity  . Alcohol use: No    Alcohol/week: 0.0 standard drinks  . Drug use: No  . Sexual activity: Yes

## 2018-11-06 ENCOUNTER — Other Ambulatory Visit: Payer: Self-pay | Admitting: General Surgery

## 2018-11-06 ENCOUNTER — Encounter (HOSPITAL_COMMUNITY): Payer: Self-pay

## 2018-11-06 ENCOUNTER — Ambulatory Visit (HOSPITAL_COMMUNITY): Payer: 59

## 2018-11-06 ENCOUNTER — Other Ambulatory Visit (HOSPITAL_COMMUNITY): Payer: Self-pay | Admitting: General Surgery

## 2018-11-06 DIAGNOSIS — M25612 Stiffness of left shoulder, not elsewhere classified: Secondary | ICD-10-CM

## 2018-11-06 DIAGNOSIS — M25512 Pain in left shoulder: Secondary | ICD-10-CM | POA: Diagnosis not present

## 2018-11-06 DIAGNOSIS — R29898 Other symptoms and signs involving the musculoskeletal system: Secondary | ICD-10-CM

## 2018-11-06 NOTE — Therapy (Signed)
[place], [place], [address] Phone: [phone]   Fax:  [phone]  Occupational Therapy Treatment  Patient Details  Name: Jennifer Coleman MRN: [id number] Date of Birth: 1951-07-26 Referring Provider (OT): Dr. Joni Fears   Encounter Date: 11/06/2018  OT End of Session - 11/06/18 1338    Visit Number  7    Number of Visits  16    Date for OT Re-Evaluation  12/14/18    Authorization Type  [facility]    Authorization Time Period  25 visit limit, starts over on 10/29/18    Authorization - Visit Number  2    Authorization - Number of Visits  25    OT Start Time  2952   Pt arrived late   OT Stop Time  1119    OT Time Calculation (min)  32 min    Activity Tolerance  Patient tolerated treatment well    Behavior During Therapy  [facility] for tasks assessed/performed       Past Medical History:  Diagnosis Date  . Anxiety   . Arthritis   . Dyspnea   . Dysrhythmia   . Family history of anesthesia complication 25 yrs ago   Sister allergic to sccinocholine - unable to come off ventilator  . Hypertension   . Pneumonia   . PONV (postoperative nausea and vomiting)   . Premature ventricular contractions   . Shingles   . Varicose veins   . Varicose veins of left lower extremity     Past Surgical History:  Procedure Laterality Date  . CARPAL TUNNEL RELEASE    . CERVICAL DISC SURGERY  1992   Fusion  . COLONOSCOPY N/A 06/07/2016   Procedure: COLONOSCOPY;  Surgeon: Rogene Houston, MD;  [facility];  Service: Endoscopy;  Laterality: N/A;  1:00  . KNEE ARTHROSCOPY  09/16/2012   Procedure: ARTHROSCOPY KNEE;  Surgeon: [name], MD;  Location: [place];  Service: Orthopedics;  Laterality: Right;  Right Knee Arthroscopy  . SALPINGOOPHORECTOMY Left 1985  . SHOULDER ARTHROSCOPY WITH OPEN ROTATOR CUFF REPAIR AND DISTAL CLAVICLE ACROMINECTOMY Right 05/03/2016   Procedure: RIGHT SHOULDER ARTHROSCOPY WITH MINI-OPEN ROTATOR  CUFF REPAIR,DISTAL CLAVICLE RESECTION, SUBACROMIAL DECOMPRESSION.;  Surgeon: [name], MD;  Location: [place];  Service: Orthopedics;  Laterality: Right;  . SHOULDER ARTHROSCOPY WITH ROTATOR CUFF REPAIR Right 09/25/2016   Procedure: Right Shoulder Manipulation, Arthroscopic Debridement of Joint, Removal of Loose Body and Re-Repair of Rotator Cuff;  Surgeon: [name], MD;  Location: [place];  Service: Orthopedics;  Laterality: Right;    There were no vitals filed for this visit.  Subjective Assessment - 11/06/18 1058    Subjective   S: The doctor said to continue therapy. I have to wear my sling for 2 more weeks.     Currently in Pain?  Yes    Pain Score  4     Pain Location  Shoulder    Pain Orientation  Left    Pain Descriptors / Indicators  Tender;Spasm    Pain Type  Acute pain    Pain Radiating Towards  elbow    Pain Onset  More than a month ago    Pain Frequency  Intermittent    Aggravating Factors   movement    Pain Relieving Factors  rest, ice, sling, pain medication - over the counter    Effect of Pain on Daily Activities  max effect  Multiple Pain Sites  No         [facility] OT Assessment - 11/06/18 1100      Assessment   Medical Diagnosis  S/P Left RCR, SAD/DCE      Precautions   Precautions  Shoulder    Type of Shoulder Precautions  Follow protocol from Dr. Durward Fortes    Shoulder Interventions  Shoulder sling/immobilizer               OT Treatments/Exercises (OP) - 11/06/18 1100      Exercises   Exercises  Shoulder      Shoulder Exercises: Supine   Protraction  PROM;5 reps    Horizontal ABduction  PROM;5 reps    External Rotation  PROM;5 reps    Internal Rotation  PROM;5 reps    Flexion  PROM;5 reps    ABduction  PROM;5 reps      Shoulder Exercises: Seated   Protraction  AAROM;10 reps    External Rotation  AAROM;10 reps    Internal Rotation  AAROM;10 reps    Flexion  AAROM;10 reps    Abduction  AAROM;10 reps     ABduction Limitations  just below shoulder level      Shoulder Exercises: Standing   Extension  Theraband;10 reps    Theraband Level (Shoulder Extension)  Level 1 (Yellow)    Row  Theraband;10 reps    Theraband Level (Shoulder Row)  Level 1 (Yellow)    Retraction  Theraband;10 reps    Theraband Level (Shoulder Retraction)  Level 1 (Yellow)             OT Education - 11/06/18 1337    Education Details  yellow theraband row, retraction, extension    Person(s) Educated  Patient    Methods  Explanation;Demonstration;Verbal cues;Handout;Tactile cues    Comprehension  Verbalized understanding;Returned demonstration       OT Short Term Goals - 10/20/18 1342      OT SHORT TERM GOAL #1   Title  Patient will be educated on a HEP for left shoulder ROM and strength.    Time  4    Period  Weeks    Status  On-going      OT SHORT TERM GOAL #2   Title  Patient will improve left shoulder  P/ROM to [facility] in order to complete B/ADLs with increased independence.    Time  4    Period  Weeks    Status  On-going      OT SHORT TERM GOAL #3   Title  Patient will improve left shoulder and strength to 3+/5 for increased ability to pick up baskets of laundry.    Time  4    Period  Weeks    Status  On-going      OT SHORT TERM GOAL #4   Title  Patient will decrease left shoulder pain to 4/10 or better in order to complete B/ADLs with greater independence.    Time  4    Period  Weeks    Status  On-going      OT SHORT TERM GOAL #5   Title  Patient will decrease fascial restrictions to moderate amount in left shoulder region for greater mobility needed for daily tasks.     Time  4    Period  Weeks    Status  On-going        OT Long Term Goals - 10/20/18 1342      OT LONG TERM GOAL #1  Title  Patient will return to prior level of independence with all B/IADLs, work, and leisure activities using her left arm normally.     Time  8    Period  Weeks    Status  On-going      OT LONG TERM  GOAL #2   Title  Patient will improve left shoulder A/ROM to WNL for greater ability to use left arm actively work and home.     Time  8    Period  Weeks    Status  On-going      OT LONG TERM GOAL #3   Title  Patient will improve left shoulder and elbow strength to 5/5 for greater ability to lift bags of mulch when completing yardwork.      Time  8    Period  Weeks    Status  On-going      OT LONG TERM GOAL #4   Title  Patient will decrease left shoulder pain to 2/10 or better when completing functional actiivites.     Time  8    Period  Weeks    Status  On-going      OT LONG TERM GOAL #5   Title  Paitent will decrease left fascial restrictions in left shoulder to trace amount in order to have greater mobility needed for use of left arm with functional activiites.      Time  8    Period  Weeks    Status  On-going            Plan - 11/06/18 1340    Clinical Impression Statement  A: Patient arrived late and manual therapy was unable to be completed. patient reports that she is to wear the sling for 2 more weeks. She continues to report increased tenderness and muscle spasms in her anterior shoulder region. She is able to achieve functional/almost full passive ROM supine for all shoulder ranges except external rotation. Distraction to shoulder completed during IR/er passive stretching which assisted with lessening the pain level slightly. Completed AA/ROM seated with max VC for form and technique. Modifications were made if needed due pain level. Pt provided with yellow theraband for HEP.     Plan  P: Continue with following protocol. Add PVC pipe slide and wall wash if able. Resume missed exercises as able and complete manual techniques if time allows.     Consulted and Agree with Plan of Care  Patient       Patient will benefit from skilled therapeutic intervention in order to improve the following deficits and impairments:  Decreased range of motion, Impaired flexibility,  Increased muscle spasms, Impaired UE functional use, Pain, Decreased strength  Visit Diagnosis: Stiffness of left shoulder, not elsewhere classified  Acute pain of left shoulder  Other symptoms and signs involving the musculoskeletal system    Problem List Patient Active Problem List   Diagnosis Date Noted  . Impingement syndrome of left shoulder 09/30/2018  . Unspecified rotator cuff tear or rupture of right shoulder, not specified as traumatic 09/30/2018  . Acromioclavicular joint arthritis 09/30/2018  . Sun-damaged skin 01/03/2018  . Morbid obesity ([place]) 11/05/2017  . Depression with anxiety 11/05/2017  . Nontraumatic incomplete tear of right rotator cuff 09/25/2016  . Foreign body of shoulder right  09/25/2016  . 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 Shoulder joint painful on movement, right 08/29/2016  . Epigastric pain 08/11/2015  . Chest pain, rule out acute myocardial infarction 08/11/2015  .  Renal insufficiency 08/11/2015  . Venous stasis 04/13/2015  . Depression 04/13/2015  . Essential hypertension 09/12/2014  . PVCs (premature ventricular contractions) 09/07/2014  . Precordial pain 09/07/2014   Ailene Ravel, OTR/L,CBIS  [phone]  11/06/2018, 1:43 PM  [place] [address] [place], [place], [address] Phone: [phone]   Fax:  [phone]  Name: Jennifer Coleman MRN: [id number] Date of Birth: [birth year]

## 2018-11-06 NOTE — Patient Instructions (Signed)

## 2018-11-10 ENCOUNTER — Telehealth (HOSPITAL_COMMUNITY): Payer: Self-pay | Admitting: Occupational Therapy

## 2018-11-10 ENCOUNTER — Ambulatory Visit (HOSPITAL_COMMUNITY): Payer: 59 | Admitting: Occupational Therapy

## 2018-11-10 NOTE — Telephone Encounter (Signed)
She is not feeling well and will not be able to make it today.

## 2018-11-11 ENCOUNTER — Encounter: Payer: 59 | Attending: General Surgery | Admitting: Dietician

## 2018-11-11 ENCOUNTER — Encounter: Payer: Self-pay | Admitting: Dietician

## 2018-11-11 VITALS — Ht 65.0 in | Wt 249.0 lb

## 2018-11-11 DIAGNOSIS — E669 Obesity, unspecified: Secondary | ICD-10-CM | POA: Diagnosis not present

## 2018-11-11 NOTE — Patient Instructions (Signed)
   Start working through the [employer] discussed today.   Begin taking multivitamin and vitamin D supplements if you are not already.   See you at Pre-Op Class!

## 2018-11-11 NOTE — Progress Notes (Signed)
Bariatric Pre-Op Nutrition Assessment for Planned Sleeve Gastrectomy Surgery Medical Nutrition Therapy  Appt Start Time: 9:30am  End time: 10:30am  Patient was seen on 11/11/2018 for Pre-Operative Nutrition Assessment. Assessment and letter of approval faxed to [facility] Surgery Bariatric Surgery Program coordinator on 11/11/2018.   Pt expectation of surgery: To lose weight and help relieve pain.  Pt expectation of dietitian: To educate more about nutrition.   Anthropometrics  Start weight at [facility]: 249 lbs (11/11/2018) Height: 65 in BMI: 41.4 kg/m2    Clinical  Medical Hx: obesity, hypertension, hypercholesterolemia, arthritis, depression & anxiety  Surgeries: rotator cuff surgery (left 2019, right 2017) Medications: enalapril, lexapro, zyrtec  Allergies: penicillin, acyclovir, codeine, morphine  Psychosocial/Lifestyle Pt arrived late to appointment dt not checking for the correct address. Pt cares for 2 of her grandchildren who she and her husband have custody of.   24-Hr Dietary Recall First Meal: english muffin + egg + swiss cheese Snack: none Second Meal: toasted pimento cheese sandwich Snack: 2 tangerines Third Meal: codon bleu chicken + broccoli + green beans + potatoes Snack: 1 slice chocolate pie  Beverages: water + diet Coke or Sprite zero   Food & Nutrition Related Hx Dietary Hx: Pt states she likes [place] bacon, chili beans, [place] food, fast food, milkshakes, diet sodas, and sweet tea.  Supplements: MVI (Women's 50+) Estimated Daily Fluid Intake: 3-4 bottles oz GI / Other Notable Symptoms: none  Physical Activity  Current average weekly physical activity: walking 30-45 mins/day  Estimated Energy Needs Calories: 1600 Carbohydrate: 180g Protein: 120g Fat: 44g  Pre-Op Goals Reviewed with the Patient . Track food and beverage intake (try MyFitness Pal or the Baritastic app) . Make healthy food choices while monitoring portion sizes . Avoid concentrated  sugars and fried foods . Keep fat & sugar in the single digits per serving on food labels . Practice CHEWING your food (aim for applesauce consistency) . Practice not drinking 15 minutes before, during, and 30 minutes after each meal and snack . Avoid all carbonated beverages (ex: soda, sparkling beverages)  . Limit caffeinated beverages (ex: coffee, tea, energy drinks) . Avoid all sugar-sweetened beverages (ex: regular soda, sports drinks)  . Avoid alcohol  . Consume 3 meals per day or try to eat every 3-5 hours . Make a list of non-food related activities . Aim for 64-100 ounces of FLUID daily (with at least half of fluid intake being plain water)  . Aim for at least 60-80 grams of PROTEIN daily . Look for a liquid protein source that contains ?15 g protein and ?5 g carbohydrate (ex: shakes, drinks, shots) . Physical activity is an important part of a healthy lifestyle so keep it moving! The goal is to reach 150 minutes of exercise per week, including cardiovascular and weight baring activity.  Handouts Provided Include  . Bariatric Surgery Handouts (Nutrition Visits + Pre-Op Goals + Protein Shakes + Vitamins & Minerals + Support Group 2020 Schedule)  Learning Style & Readiness for Change Teaching method utilized: Visual & Auditory  Demonstrated degree of understanding via: Teach Back  Barriers to learning/adherence to lifestyle change: None Identified   Next Steps Supervised Weight Loss (SWL) Visits Needed: 0  Patient is to return to [facility] for Pre-Op Class (>2 weeks before surgery) and Post-Op Class (2 weeks after surgery) for further nutrition education when surgery date is scheduled.

## 2018-11-13 ENCOUNTER — Encounter (HOSPITAL_COMMUNITY): Payer: Self-pay | Admitting: Occupational Therapy

## 2018-11-13 ENCOUNTER — Ambulatory Visit (HOSPITAL_COMMUNITY): Payer: 59 | Admitting: Occupational Therapy

## 2018-11-13 DIAGNOSIS — M25512 Pain in left shoulder: Secondary | ICD-10-CM | POA: Diagnosis not present

## 2018-11-13 DIAGNOSIS — M25612 Stiffness of left shoulder, not elsewhere classified: Secondary | ICD-10-CM

## 2018-11-13 DIAGNOSIS — R29898 Other symptoms and signs involving the musculoskeletal system: Secondary | ICD-10-CM | POA: Diagnosis not present

## 2018-11-13 NOTE — Therapy (Signed)
[place], [place], [address] Phone: [phone]   Fax:  [phone]  Occupational Therapy Treatment  Patient Details  Name: Jennifer Coleman MRN: [id number] Date of Birth: January 16, 1951 Referring Provider (OT): Dr. Joni Fears   Encounter Date: 11/13/2018  OT End of Session - 11/13/18 1141    Visit Number  8    Number of Visits  16    Date for OT Re-Evaluation  12/14/18    Authorization Type  UMR    Authorization Time Period  25 visit limit    Authorization - Visit Number  3    Authorization - Number of Visits  25    OT Start Time  [phone]   pt arrived late   OT Stop Time  1031    OT Time Calculation (min)  36 min    Activity Tolerance  Patient tolerated treatment well    Behavior During Therapy  [facility] for tasks assessed/performed       Past Medical History:  Diagnosis Date  . Anxiety   . Arthritis   . Dyspnea   . Dysrhythmia   . Family history of anesthesia complication 25 yrs ago   Sister allergic to sccinocholine - unable to come off ventilator  . Hypertension   . Pneumonia   . PONV (postoperative nausea and vomiting)   . Premature ventricular contractions   . Shingles   . Varicose veins   . Varicose veins of left lower extremity     Past Surgical History:  Procedure Laterality Date  . CARPAL TUNNEL RELEASE    . CERVICAL DISC SURGERY  1992   Fusion  . COLONOSCOPY N/A 06/07/2016   Procedure: COLONOSCOPY;  Surgeon: Rogene Houston, MD;  [facility];  Service: Endoscopy;  Laterality: N/A;  1:00  . KNEE ARTHROSCOPY  09/16/2012   Procedure: ARTHROSCOPY KNEE;  Surgeon: [name], MD;  Location: [place];  Service: Orthopedics;  Laterality: Right;  Right Knee Arthroscopy  . SALPINGOOPHORECTOMY Left 1985  . SHOULDER ARTHROSCOPY WITH OPEN ROTATOR CUFF REPAIR AND DISTAL CLAVICLE ACROMINECTOMY Right 05/03/2016   Procedure: RIGHT SHOULDER ARTHROSCOPY WITH MINI-OPEN ROTATOR CUFF REPAIR,DISTAL  CLAVICLE RESECTION, SUBACROMIAL DECOMPRESSION.;  Surgeon: [name], MD;  Location: [place];  Service: Orthopedics;  Laterality: Right;  . SHOULDER ARTHROSCOPY WITH ROTATOR CUFF REPAIR Right 09/25/2016   Procedure: Right Shoulder Manipulation, Arthroscopic Debridement of Joint, Removal of Loose Body and Re-Repair of Rotator Cuff;  Surgeon: [name], MD;  Location: [place];  Service: Orthopedics;  Laterality: Right;    There were no vitals filed for this visit.  Subjective Assessment - 11/13/18 0956    Subjective   S: It still wakes me up sometimes.     Currently in Pain?  Yes    Pain Score  4     Pain Location  Shoulder    Pain Orientation  Left    Pain Descriptors / Indicators  Aching;Sore    Pain Type  Acute pain    Pain Radiating Towards  elbow    Pain Onset  More than a month ago    Pain Frequency  Intermittent    Aggravating Factors   movement    Pain Relieving Factors  rest, ice, sling, pain medication-over the counter    Effect of Pain on Daily Activities  mod effect    Multiple Pain Sites  No         [facility] OT  Assessment - 11/13/18 0956      Assessment   Medical Diagnosis  S/P Left RCR, SAD/DCE      Precautions   Precautions  Shoulder    Type of Shoulder Precautions  Follow protocol from Dr. Durward Fortes               OT Treatments/Exercises (OP) - 11/13/18 0958      Exercises   Exercises  Shoulder      Shoulder Exercises: Supine   Protraction  PROM;5 reps;AROM;10 reps    Horizontal ABduction  PROM;5 reps;AROM;10 reps    External Rotation  PROM;5 reps;AROM;10 reps    Internal Rotation  PROM;5 reps;AROM;10 reps    Flexion  PROM;5 reps;AROM;10 reps    ABduction  PROM;5 reps;AROM;10 reps      Shoulder Exercises: Seated   Protraction  AROM;10 reps    Horizontal ABduction  AAROM;10 reps    External Rotation  AROM;10 reps    Internal Rotation  AROM;10 reps    Flexion  AAROM;10 reps    Abduction  AAROM;10 reps       Shoulder Exercises: Standing   Extension  Theraband;10 reps    Theraband Level (Shoulder Extension)  Level 2 (Red)    Row  Theraband;10 reps    Theraband Level (Shoulder Row)  Level 2 (Red)    Retraction  Theraband;10 reps    Theraband Level (Shoulder Retraction)  Level 2 (Red)      Shoulder Exercises: ROM/Strengthening   Wall Wash  1'    Proximal Shoulder Strengthening, Supine  10X each no rest breaks             OT Education - 11/13/18 1022    Education Details  instructed pt to complete AA/ROM in standing/sitting    Person(s) Educated  Patient    Methods  Explanation    Comprehension  Verbalized understanding       OT Short Term Goals - 10/20/18 1342      OT SHORT TERM GOAL #1   Title  Patient will be educated on a HEP for left shoulder ROM and strength.    Time  4    Period  Weeks    Status  On-going      OT SHORT TERM GOAL #2   Title  Patient will improve left shoulder  P/ROM to [facility] in order to complete B/ADLs with increased independence.    Time  4    Period  Weeks    Status  On-going      OT SHORT TERM GOAL #3   Title  Patient will improve left shoulder and strength to 3+/5 for increased ability to pick up baskets of laundry.    Time  4    Period  Weeks    Status  On-going      OT SHORT TERM GOAL #4   Title  Patient will decrease left shoulder pain to 4/10 or better in order to complete B/ADLs with greater independence.    Time  4    Period  Weeks    Status  On-going      OT SHORT TERM GOAL #5   Title  Patient will decrease fascial restrictions to moderate amount in left shoulder region for greater mobility needed for daily tasks.     Time  4    Period  Weeks    Status  On-going        OT Long Term Goals - 10/20/18 1342  OT LONG TERM GOAL #1   Title  Patient will return to prior level of independence with all B/IADLs, work, and leisure activities using her left arm normally.     Time  8    Period  Weeks    Status  On-going      OT LONG  TERM GOAL #2   Title  Patient will improve left shoulder A/ROM to WNL for greater ability to use left arm actively work and home.     Time  8    Period  Weeks    Status  On-going      OT LONG TERM GOAL #3   Title  Patient will improve left shoulder and elbow strength to 5/5 for greater ability to lift bags of mulch when completing yardwork.      Time  8    Period  Weeks    Status  On-going      OT LONG TERM GOAL #4   Title  Patient will decrease left shoulder pain to 2/10 or better when completing functional actiivites.     Time  8    Period  Weeks    Status  On-going      OT LONG TERM GOAL #5   Title  Paitent will decrease left fascial restrictions in left shoulder to trace amount in order to have greater mobility needed for use of left arm with functional activiites.      Time  8    Period  Weeks    Status  On-going            Plan - 11/13/18 1016    Clinical Impression Statement  A: No manual therapy completed today as pt has trace restrictions in left shoulder region. Progressed to A/ROM supine, some A/ROM standing and continued with AA/ROM standing. Added proximal shoulder strengthening in supine and wall wash, upgraded scapular theraband to red. Verbal cuing for form and technique.     Plan  P: Mini-reassessment, FOTO. Update HEP for A/ROM in supine, continue to progress in standing as pt is able to tolerate.        Patient will benefit from skilled therapeutic intervention in order to improve the following deficits and impairments:  Decreased range of motion, Impaired flexibility, Increased muscle spasms, Impaired UE functional use, Pain, Decreased strength  Visit Diagnosis: Stiffness of left shoulder, not elsewhere classified  Acute pain of left shoulder  Other symptoms and signs involving the musculoskeletal system    Problem List Patient Active Problem List   Diagnosis Date Noted  . Impingement syndrome of left shoulder 09/30/2018  . Unspecified rotator  cuff tear or rupture of right shoulder, not specified as traumatic 09/30/2018  . Acromioclavicular joint arthritis 09/30/2018  . Sun-damaged skin 01/03/2018  . Morbid obesity ([place]) 11/05/2017  . Depression with anxiety 11/05/2017  . Nontraumatic incomplete tear of right rotator cuff 09/25/2016  . Foreign body of shoulder right  09/25/2016  . 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 Shoulder joint painful on movement, right 08/29/2016  . Epigastric pain 08/11/2015  . Chest pain, rule out acute myocardial infarction 08/11/2015  . Renal insufficiency 08/11/2015  . Venous stasis 04/13/2015  . Depression 04/13/2015  . Essential hypertension 09/12/2014  . PVCs (premature ventricular contractions) 09/07/2014  . Precordial pain 09/07/2014   Guadelupe Sabin, OTR/L  [phone] 11/13/2018, 11:42 AM  [place], [place], [address] Phone: [phone]   Fax:  [phone]  Name: Jennifer Coleman MRN: [id number] Date of Birth: 12-21-1950

## 2018-11-14 ENCOUNTER — Encounter (HOSPITAL_COMMUNITY): Payer: 59 | Admitting: Specialist

## 2018-11-14 ENCOUNTER — Telehealth (HOSPITAL_COMMUNITY): Payer: Self-pay

## 2018-11-14 NOTE — Telephone Encounter (Signed)
L/m to cx or r/s 12/05/18  apptment, ask pt to call us back and let us know what they want to do. NF1/17/2020

## 2018-11-20 ENCOUNTER — Inpatient Hospital Stay (HOSPITAL_COMMUNITY)
Admission: EM | Admit: 2018-11-20 | Discharge: 2018-11-24 | DRG: 418 | Disposition: A | Discharge status: Home / Self Care | Payer: 59 | Attending: General Surgery | Admitting: General Surgery

## 2018-11-20 ENCOUNTER — Telehealth (HOSPITAL_COMMUNITY): Payer: Self-pay | Admitting: Occupational Therapy

## 2018-11-20 ENCOUNTER — Emergency Department (HOSPITAL_COMMUNITY): Payer: 59

## 2018-11-20 ENCOUNTER — Other Ambulatory Visit: Payer: Self-pay

## 2018-11-20 ENCOUNTER — Ambulatory Visit (HOSPITAL_COMMUNITY): Payer: 59 | Admitting: Occupational Therapy

## 2018-11-20 ENCOUNTER — Encounter (HOSPITAL_COMMUNITY): Payer: Self-pay | Admitting: Emergency Medicine

## 2018-11-20 DIAGNOSIS — K8000 Calculus of gallbladder with acute cholecystitis without obstruction: Secondary | ICD-10-CM | POA: Diagnosis not present

## 2018-11-20 DIAGNOSIS — Z87891 Personal history of nicotine dependence: Secondary | ICD-10-CM

## 2018-11-20 DIAGNOSIS — Z6841 Body Mass Index (BMI) 40.0 and over, adult: Secondary | ICD-10-CM | POA: Diagnosis not present

## 2018-11-20 DIAGNOSIS — I10 Essential (primary) hypertension: Secondary | ICD-10-CM | POA: Diagnosis present

## 2018-11-20 DIAGNOSIS — Z880 Allergy status to penicillin: Secondary | ICD-10-CM

## 2018-11-20 DIAGNOSIS — K810 Acute cholecystitis: Principal | ICD-10-CM | POA: Diagnosis present

## 2018-11-20 DIAGNOSIS — R112 Nausea with vomiting, unspecified: Secondary | ICD-10-CM | POA: Diagnosis not present

## 2018-11-20 DIAGNOSIS — R1011 Right upper quadrant pain: Secondary | ICD-10-CM | POA: Diagnosis not present

## 2018-11-20 DIAGNOSIS — Z91013 Allergy to seafood: Secondary | ICD-10-CM

## 2018-11-20 DIAGNOSIS — Z885 Allergy status to narcotic agent status: Secondary | ICD-10-CM

## 2018-11-20 DIAGNOSIS — F419 Anxiety disorder, unspecified: Secondary | ICD-10-CM | POA: Diagnosis present

## 2018-11-20 DIAGNOSIS — M1990 Unspecified osteoarthritis, unspecified site: Secondary | ICD-10-CM | POA: Diagnosis present

## 2018-11-20 DIAGNOSIS — D62 Acute posthemorrhagic anemia: Secondary | ICD-10-CM | POA: Diagnosis not present

## 2018-11-20 DIAGNOSIS — Z79899 Other long term (current) drug therapy: Secondary | ICD-10-CM

## 2018-11-20 DIAGNOSIS — Z8249 Family history of ischemic heart disease and other diseases of the circulatory system: Secondary | ICD-10-CM

## 2018-11-20 DIAGNOSIS — Z7951 Long term (current) use of inhaled steroids: Secondary | ICD-10-CM

## 2018-11-20 DIAGNOSIS — K5730 Diverticulosis of large intestine without perforation or abscess without bleeding: Secondary | ICD-10-CM | POA: Diagnosis not present

## 2018-11-20 DIAGNOSIS — Z888 Allergy status to other drugs, medicaments and biological substances status: Secondary | ICD-10-CM

## 2018-11-20 HISTORY — DX: Headache: R51

## 2018-11-20 LAB — COMPREHENSIVE METABOLIC PANEL
ALT: 19 U/L (ref 0–44)
AST: 24 U/L (ref 15–41)
Albumin: 3.9 g/dL (ref 3.5–5.0)
Alkaline Phosphatase: 74 U/L (ref 38–126)
Anion gap: 9 (ref 5–15)
BUN: 25 mg/dL — ABNORMAL HIGH (ref 8–23)
CO2: 25 mmol/L (ref 22–32)
Calcium: 10 mg/dL (ref 8.9–10.3)
Chloride: 106 mmol/L (ref 98–111)
Creatinine, Ser: 0.81 mg/dL (ref 0.44–1.00)
GFR calc Af Amer: >60 mL/min (ref >60)
GFR calc non Af Amer: >60 mL/min (ref >60)
Glucose, Bld: 116 mg/dL — ABNORMAL HIGH (ref 70–99)
Potassium: 3.6 mmol/L (ref 3.5–5.1)
Sodium: 140 mmol/L (ref 135–145)
Total Bilirubin: 0.7 mg/dL (ref 0.3–1.2)
Total Protein: 7.2 g/dL (ref 6.5–8.1)

## 2018-11-20 LAB — CBC
HCT: 42.9 % (ref 36.0–46.0)
Hemoglobin: 13.5 g/dL (ref 12.0–15.0)
MCH: 30.8 pg (ref 26.0–34.0)
MCHC: 31.5 g/dL (ref 30.0–36.0)
MCV: 97.9 fL (ref 80.0–100.0)
NRBC: 0 % (ref 0.0–0.2)
Platelets: 287 10*3/uL (ref 150–400)
RBC: 4.38 MIL/uL (ref 3.87–5.11)
RDW: 12.3 % (ref 11.5–15.5)
WBC: 12.6 10*3/uL — ABNORMAL HIGH (ref 4.0–10.5)

## 2018-11-20 LAB — URINALYSIS, ROUTINE W REFLEX MICROSCOPIC
Bilirubin Urine: NEGATIVE
Glucose, UA: NEGATIVE mg/dL
Ketones, ur: NEGATIVE mg/dL
LEUKOCYTES UA: NEGATIVE
NITRITE: NEGATIVE
PH: 5 (ref 5.0–8.0)
Protein, ur: NEGATIVE mg/dL
Specific Gravity, Urine: 1.025 (ref 1.005–1.030)

## 2018-11-20 LAB — LIPASE, BLOOD: Lipase: 25 U/L (ref 11–51)

## 2018-11-20 MED ORDER — ONDANSETRON HCL 4 MG/2ML IJ SOLN
4.0000 mg | Freq: Four times a day (QID) | INTRAMUSCULAR | Status: DC | PRN
  Administered 2018-11-21: 4 mg via INTRAVENOUS
  Filled 2018-11-20: qty 2

## 2018-11-20 MED ORDER — FENTANYL CITRATE (PF) 100 MCG/2ML IJ SOLN
50.0000 ug | Freq: Once | INTRAMUSCULAR | Status: AC
  Administered 2018-11-20: 50 ug via INTRAVENOUS
  Filled 2018-11-20: qty 2

## 2018-11-20 MED ORDER — ONDANSETRON 4 MG PO TBDP: 4.0000 mg | ORAL_TABLET | Freq: Four times a day (QID) | ORAL | Status: DC | PRN

## 2018-11-20 MED ORDER — ESCITALOPRAM OXALATE 10 MG PO TABS
20.0000 mg | ORAL_TABLET | Freq: Every day | ORAL | Status: DC
  Administered 2018-11-22 – 2018-11-24 (×3): 20 mg via ORAL
  Filled 2018-11-20 (×3): qty 2

## 2018-11-20 MED ORDER — ALBUTEROL SULFATE HFA 108 (90 BASE) MCG/ACT IN AERS: 2.0000 | INHALATION_SPRAY | RESPIRATORY_TRACT | Status: DC | PRN

## 2018-11-20 MED ORDER — CIPROFLOXACIN IN D5W 400 MG/200ML IV SOLN
400.0000 mg | INTRAVENOUS | Status: AC
  Administered 2018-11-21: 400 mg via INTRAVENOUS

## 2018-11-20 MED ORDER — ENALAPRIL MALEATE 20 MG PO TABS
20.0000 mg | ORAL_TABLET | Freq: Two times a day (BID) | ORAL | Status: DC
  Administered 2018-11-20 – 2018-11-24 (×6): 20 mg via ORAL
  Filled 2018-11-20: qty 4
  Filled 2018-11-20: qty 1
  Filled 2018-11-20: qty 4
  Filled 2018-11-20 (×3): qty 1
  Filled 2018-11-20 (×2): qty 4
  Filled 2018-11-20 (×8): qty 1
  Filled 2018-11-20: qty 4

## 2018-11-20 MED ORDER — ACETAMINOPHEN 650 MG RE SUPP
650.0000 mg | Freq: Four times a day (QID) | RECTAL | Status: DC | PRN
  Administered 2018-11-23: 650 mg via RECTAL

## 2018-11-20 MED ORDER — CIPROFLOXACIN IN D5W 400 MG/200ML IV SOLN
400.0000 mg | Freq: Two times a day (BID) | INTRAVENOUS | Status: DC
  Administered 2018-11-20: 400 mg via INTRAVENOUS
  Filled 2018-11-20: qty 200

## 2018-11-20 MED ORDER — ZOLPIDEM TARTRATE 5 MG PO TABS: 5.0000 mg | ORAL_TABLET | Freq: Every evening | ORAL | Status: DC | PRN

## 2018-11-20 MED ORDER — PANTOPRAZOLE SODIUM 40 MG IV SOLR
40.0000 mg | Freq: Every day | INTRAVENOUS | Status: DC
  Administered 2018-11-20 – 2018-11-23 (×4): 40 mg via INTRAVENOUS
  Filled 2018-11-20 (×4): qty 40

## 2018-11-20 MED ORDER — DIPHENHYDRAMINE HCL 50 MG/ML IJ SOLN: 12.5000 mg | Freq: Four times a day (QID) | INTRAMUSCULAR | Status: DC | PRN

## 2018-11-20 MED ORDER — ONDANSETRON HCL 4 MG/2ML IJ SOLN
4.0000 mg | Freq: Once | INTRAMUSCULAR | Status: AC
  Administered 2018-11-20: 4 mg via INTRAVENOUS
  Filled 2018-11-20: qty 2

## 2018-11-20 MED ORDER — SODIUM CHLORIDE 0.9 % IV BOLUS
500.0000 mL | Freq: Once | INTRAVENOUS | Status: AC
  Administered 2018-11-20: 500 mL via INTRAVENOUS

## 2018-11-20 MED ORDER — SIMETHICONE 80 MG PO CHEW: 40.0000 mg | CHEWABLE_TABLET | Freq: Four times a day (QID) | ORAL | Status: DC | PRN

## 2018-11-20 MED ORDER — METOPROLOL TARTRATE 5 MG/5ML IV SOLN: 5.0000 mg | Freq: Four times a day (QID) | INTRAVENOUS | Status: DC | PRN

## 2018-11-20 MED ORDER — DIPHENHYDRAMINE HCL 12.5 MG/5ML PO ELIX: 12.5000 mg | ORAL_SOLUTION | Freq: Four times a day (QID) | ORAL | Status: DC | PRN

## 2018-11-20 MED ORDER — OXYCODONE HCL 5 MG PO TABS
5.0000 mg | ORAL_TABLET | ORAL | Status: DC | PRN
  Administered 2018-11-21: 5 mg via ORAL
  Administered 2018-11-22 – 2018-11-24 (×6): 10 mg via ORAL
  Filled 2018-11-20 (×2): qty 2
  Filled 2018-11-20: qty 1
  Filled 2018-11-20 (×5): qty 2

## 2018-11-20 MED ORDER — ACETAMINOPHEN 325 MG PO TABS
650.0000 mg | ORAL_TABLET | Freq: Four times a day (QID) | ORAL | Status: DC | PRN
  Administered 2018-11-21 – 2018-11-22 (×2): 650 mg via ORAL
  Filled 2018-11-20 (×3): qty 2

## 2018-11-20 MED ORDER — IOPAMIDOL (ISOVUE-300) INJECTION 61%
100.0000 mL | Freq: Once | INTRAVENOUS | Status: AC | PRN
  Administered 2018-11-20: 100 mL via INTRAVENOUS

## 2018-11-20 MED ORDER — HYDROMORPHONE HCL 1 MG/ML IJ SOLN
0.5000 mg | INTRAMUSCULAR | Status: DC | PRN
  Administered 2018-11-21 – 2018-11-22 (×3): 0.5 mg via INTRAVENOUS
  Filled 2018-11-20 (×4): qty 0.5

## 2018-11-20 MED ORDER — FLUTICASONE PROPIONATE 50 MCG/ACT NA SUSP
2.0000 | Freq: Every day | NASAL | Status: DC
  Administered 2018-11-22 – 2018-11-24 (×3): 2 via NASAL
  Filled 2018-11-20 (×2): qty 16

## 2018-11-20 MED ORDER — LACTATED RINGERS IV SOLN
INTRAVENOUS | Status: DC
  Administered 2018-11-20 – 2018-11-22 (×4): via INTRAVENOUS

## 2018-11-20 MED ORDER — DOCUSATE SODIUM 100 MG PO CAPS
100.0000 mg | ORAL_CAPSULE | Freq: Two times a day (BID) | ORAL | Status: DC
  Administered 2018-11-20 – 2018-11-24 (×7): 100 mg via ORAL
  Filled 2018-11-20 (×7): qty 1

## 2018-11-20 NOTE — ED Provider Notes (Signed)
[facility] EMERGENCY DEPARTMENT Provider Note   CSN: [id number] Arrival date & time: 11/20/18  1647     History   Chief Complaint Chief Complaint  Patient presents with  . Abdominal Pain    HPI Jennifer Coleman is a 68 y.o. female.  HPI Patient states that she developed right upper quadrant abdominal pain, nausea which started around 1 AM.  States the pain radiates through to her back.  Does have multiple episodes of vomiting.  Recently vomit was bilious.  No evidence of blood.  Patient been having chills but no documented fever.  Denies shortness of breath or cough.  No diarrhea or constipation.  No previously similar symptoms. Past Medical History:  Diagnosis Date  . Anxiety   . Arthritis   . Dyspnea   . Dysrhythmia   . Family history of anesthesia complication 25 yrs ago   Sister allergic to sccinocholine - unable to come off ventilator  . Hypertension   . Pneumonia   . PONV (postoperative nausea and vomiting)   . Premature ventricular contractions   . Shingles   . Varicose veins   . Varicose veins of left lower extremity     Patient Active Problem List   Diagnosis Date Noted  . Impingement syndrome of left shoulder 09/30/2018  . Unspecified rotator cuff tear or rupture of right shoulder, not specified as traumatic 09/30/2018  . Acromioclavicular joint arthritis 09/30/2018  . Sun-damaged skin 01/03/2018  . Morbid obesity ([place]) 11/05/2017  . Depression with anxiety 11/05/2017  . Nontraumatic incomplete tear of right rotator cuff 09/25/2016  . Foreign body of shoulder right  09/25/2016  . 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 Shoulder joint painful on movement, right 08/29/2016  . Epigastric pain 08/11/2015  . Chest pain, rule out acute myocardial infarction 08/11/2015  . Renal insufficiency 08/11/2015  . Venous stasis 04/13/2015  . Depression 04/13/2015  . Essential hypertension 09/12/2014  . PVCs (premature ventricular contractions) 09/07/2014  .  Precordial pain 09/07/2014    Past Surgical History:  Procedure Laterality Date  . CARPAL TUNNEL RELEASE    . CERVICAL DISC SURGERY  1992   Fusion  . COLONOSCOPY N/A 06/07/2016   Procedure: COLONOSCOPY;  Surgeon: Rogene Houston, MD;  [facility];  Service: Endoscopy;  Laterality: N/A;  1:00  . KNEE ARTHROSCOPY  09/16/2012   Procedure: ARTHROSCOPY KNEE;  Surgeon: [name], MD;  Location: [place];  Service: Orthopedics;  Laterality: Right;  Right Knee Arthroscopy  . SALPINGOOPHORECTOMY Left 1985  . SHOULDER ARTHROSCOPY WITH OPEN ROTATOR CUFF REPAIR AND DISTAL CLAVICLE ACROMINECTOMY Right 05/03/2016   Procedure: RIGHT SHOULDER ARTHROSCOPY WITH MINI-OPEN ROTATOR CUFF REPAIR,DISTAL CLAVICLE RESECTION, SUBACROMIAL DECOMPRESSION.;  Surgeon: [name], MD;  Location: [place];  Service: Orthopedics;  Laterality: Right;  . SHOULDER ARTHROSCOPY WITH ROTATOR CUFF REPAIR Right 09/25/2016   Procedure: Right Shoulder Manipulation, Arthroscopic Debridement of Joint, Removal of Loose Body and Re-Repair of Rotator Cuff;  Surgeon: [name], MD;  Location: [place];  Service: Orthopedics;  Laterality: Right;     OB History   No obstetric history on file.      Home Medications    Prior to Admission medications   Medication Sig Start Date End Date Taking? Authorizing Provider  Calcium Carbonate-Vit D-Min (GNP CALCIUM PLUS 600 +D PO) Take 1 tablet by mouth daily.     [provider]  cetirizine (ZYRTEC) 10 MG tablet Take 10 mg by mouth  daily as needed for allergies.     [provider]  enalapril (VASOTEC) 20 MG tablet Take 1 tablet (20 mg total) by mouth 2 (two) times daily. 11/05/17   Nilda Simmer, NP  escitalopram (LEXAPRO) 20 MG tablet Take 1 tablet (20 mg total) by mouth daily. 09/11/18   Mikey Kirschner, MD  fluticasone (FLONASE) 50 MCG/ACT nasal spray Place 2 sprays into both nostrils daily. 10/26/16   Kathyrn Drown, MD    Multiple Vitamins-Minerals (ONE-A-DAY WOMENS 50 PLUS PO) Take 1 tablet by mouth daily.    [provider]  oxyCODONE (OXY IR/ROXICODONE) 5 MG immediate release tablet Take 1 tablet (5 mg total) by mouth every 4 (four) hours as needed for severe pain. Patient not taking: Reported on 10/15/2018 09/30/18   Cherylann Ratel, PA-C  PROAIR HFA 108 [phone] Base) MCG/ACT inhaler INHALE TWO PUFFS BY MOUTH EVERY 4 TO 6 HOURS AS NEEDED FOR FOR WHEEZING 12/04/17   Nilda Simmer, NP    Family History Family History  Problem Relation Age of Onset  . Heart disease Sister   . Heart disease Brother        54's  . Diabetes Mellitus II Sister   . Multiple myeloma Mother   . Hypertension Sister   . Hypertension Father   . Heart attack Father     Social History Social History   Tobacco Use  . Smoking status: Former Smoker    Types: Cigarettes    Last attempt to quit: 09/22/1992    Years since quitting: 26.1  . Smokeless tobacco: Never Used  Substance Use Topics  . Alcohol use: No    Alcohol/week: 0.0 standard drinks  . Drug use: No     Allergies   Penicillins; Shellfish allergy; Acyclovir and related; Codeine; and Morphine and related   Review of Systems Review of Systems  Constitutional: Positive for chills.  HENT: Negative for sore throat and trouble swallowing.   Eyes: Negative for visual disturbance.  Respiratory: Negative for cough and shortness of breath.   Cardiovascular: Negative for chest pain.  Gastrointestinal: Positive for abdominal pain, nausea and vomiting. Negative for blood in stool, constipation and diarrhea.  Genitourinary: Negative for dysuria, flank pain and frequency.  Musculoskeletal: Positive for back pain. Negative for myalgias, neck pain and neck stiffness.  Skin: Negative for rash and wound.  Neurological: Negative for dizziness, weakness, light-headedness, numbness and headaches.  All other systems reviewed and are negative.    Physical  Exam Updated Vital Signs BP (!) 162/80 (BP Location: Right Arm)   Pulse 75   Temp 98.9 F (37.2 C) (Oral)   Resp 18   Ht '5\' 5"'$  (1.651 m)   Wt 113.4 kg   SpO2 99%   BMI 41.60 kg/m   Physical Exam Vitals signs and nursing note reviewed.  Constitutional:      Appearance: Normal appearance. She is well-developed.  HENT:     Head: Normocephalic and atraumatic.     Mouth/Throat:     Mouth: Mucous membranes are moist.  Eyes:     Pupils: Pupils are equal, round, and reactive to light.  Neck:     Musculoskeletal: Normal range of motion and neck supple. No neck rigidity or muscular tenderness.  Cardiovascular:     Rate and Rhythm: Normal rate and regular rhythm.     Heart sounds: No murmur. No friction rub. No gallop.   Pulmonary:     Effort: Pulmonary effort is normal. No respiratory  distress.     Breath sounds: Normal breath sounds. No stridor. No wheezing, rhonchi or rales.  Chest:     Chest wall: No tenderness.  Abdominal:     General: Bowel sounds are normal.     Palpations: Abdomen is soft.     Tenderness: There is abdominal tenderness. There is no guarding or rebound.     Comments: Patient with right upper quadrant tenderness to palpation.  No definite rebound or guarding.  Musculoskeletal: Normal range of motion.        General: No swelling or tenderness.     Comments: No CVA tenderness bilaterally.  Lymphadenopathy:     Cervical: No cervical adenopathy.  Skin:    General: Skin is warm and dry.     Capillary Refill: Capillary refill takes less than 2 seconds.     Findings: No erythema or rash.  Neurological:     General: No focal deficit present.     Mental Status: She is alert and oriented to person, place, and time.  Psychiatric:        Mood and Affect: Mood normal.        Behavior: Behavior normal.      ED Treatments / Results  Labs (all labs ordered are listed, but only abnormal results are displayed) Labs Reviewed  COMPREHENSIVE METABOLIC PANEL -  Abnormal; Notable for the following components:      Result Value   Glucose, Bld 116 (*)    BUN 25 (*)    All other components within normal limits  CBC - Abnormal; Notable for the following components:   WBC 12.6 (*)    All other components within normal limits  URINALYSIS, ROUTINE W REFLEX MICROSCOPIC - Abnormal; Notable for the following components:   APPearance HAZY (*)    Hgb urine dipstick MODERATE (*)    Bacteria, UA RARE (*)    All other components within normal limits  LIPASE, BLOOD    EKG None  Radiology Ct Abdomen Pelvis W Contrast  Result Date: 11/20/2018 CLINICAL DATA:  Right upper quadrant pain EXAM: CT ABDOMEN AND PELVIS WITH CONTRAST TECHNIQUE: Multidetector CT imaging of the abdomen and pelvis was performed using the standard protocol following bolus administration of intravenous contrast. CONTRAST:  115m ISOVUE-300 IOPAMIDOL (ISOVUE-300) INJECTION 61% COMPARISON:  10/04/2005 FINDINGS: Lower chest: Lung bases demonstrate no acute consolidation or effusion. The heart size is within normal limits. Small hiatal hernia. Hepatobiliary: No focal hepatic abnormality. Increased density in the gallbladder lumen suspicious for stones. Pericholecystic fluid and suspected wall thickening of the gallbladder. No biliary dilatation Pancreas: Unremarkable. No pancreatic ductal dilatation or surrounding inflammatory changes. Spleen: Normal in size without focal abnormality. Adrenals/Urinary Tract: Adrenal glands are normal. No hydronephrosis. Subcentimeter hypodensity in the left kidney too small to further characterize. Bladder normal Stomach/Bowel: Stomach is within normal limits. Appendix appears normal. No evidence of bowel wall thickening, distention, or inflammatory changes. 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 Vascular/Lymphatic: Nonaneurysmal aorta. No significantly enlarged lymph nodes. Reproductive: Uterus and bilateral adnexa are unremarkable.  Other: Negative for free air or free fluid. Musculoskeletal: No acute or significant osseous findings. IMPRESSION: 1. Increased density in the gallbladder lumen, suspect for stones. There appears to be gallbladder wall thickening and small adjacent pericholecystic fluid, findings are concerning for an acute cholecystitis. This may be further evaluated with ultrasound. 2. 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 Electronically Signed   By: KDonavan FoilM.D.   On: 11/20/2018 22:27  Procedures Procedures (including critical care time)  Medications Ordered in ED Medications  fentaNYL (SUBLIMAZE) injection 50 mcg (has no administration in time range)  ondansetron (ZOFRAN) injection 4 mg (4 mg Intravenous Given 11/20/18 2113)  fentaNYL (SUBLIMAZE) injection 50 mcg (50 mcg Intravenous Given 11/20/18 2113)  sodium chloride 0.9 % bolus 500 mL (0 mLs Intravenous Stopped 11/20/18 2224)  iopamidol (ISOVUE-300) 61 % injection 100 mL (100 mLs Intravenous Contrast Given 11/20/18 2206)     Initial Impression / Assessment and Plan / ED Course  I have reviewed the triage vital signs and the nursing notes.  Pertinent labs & imaging results that were available during my care of the patient were reviewed by me and considered in my medical decision making (see chart for details).     Evidence of acute cholecystitis on CT abdomen.  Discussed with Dr. Constance Haw.  Will admit.  Patient to be kept n.p.o. after midnight.  Final Clinical Impressions(s) / ED Diagnoses   Final diagnoses:  Acute cholecystitis    ED Discharge Orders    None       Julianne Rice, MD 11/20/18 2255

## 2018-11-20 NOTE — ED Triage Notes (Signed)
Pt reports RUQ abdominal pain with n/v that began around 0100 this morning.

## 2018-11-20 NOTE — Progress Notes (Signed)
[facility]  Acute cholecystitis on CT. Admit to observation overnight. PRN for pain NPO at midnight Will see in AM Plan for Lap chole in AM. Will discuss with patient and consent in AM. Cipro now.   Curlene Labrum, MD [facility] [address] [place], [place] [address] [phone] (office)

## 2018-11-20 NOTE — Telephone Encounter (Signed)
Pt is sick and throwing up today.

## 2018-11-21 ENCOUNTER — Observation Stay (HOSPITAL_COMMUNITY): Payer: 59 | Admitting: Anesthesiology

## 2018-11-21 ENCOUNTER — Other Ambulatory Visit: Payer: Self-pay

## 2018-11-21 ENCOUNTER — Encounter (HOSPITAL_COMMUNITY): Payer: Self-pay | Admitting: *Deleted

## 2018-11-21 ENCOUNTER — Encounter (HOSPITAL_COMMUNITY)
Admission: EM | Disposition: A | Discharge status: Home / Self Care | Payer: Self-pay | Source: Home / Self Care | Attending: General Surgery

## 2018-11-21 DIAGNOSIS — K810 Acute cholecystitis: Principal | ICD-10-CM

## 2018-11-21 DIAGNOSIS — F419 Anxiety disorder, unspecified: Secondary | ICD-10-CM | POA: Diagnosis not present

## 2018-11-21 DIAGNOSIS — K8000 Calculus of gallbladder with acute cholecystitis without obstruction: Secondary | ICD-10-CM | POA: Diagnosis not present

## 2018-11-21 DIAGNOSIS — Z87891 Personal history of nicotine dependence: Secondary | ICD-10-CM | POA: Diagnosis not present

## 2018-11-21 DIAGNOSIS — I10 Essential (primary) hypertension: Secondary | ICD-10-CM | POA: Diagnosis not present

## 2018-11-21 HISTORY — PX: CHOLECYSTECTOMY: SHX55

## 2018-11-21 LAB — HEMOGLOBIN AND HEMATOCRIT, BLOOD
HCT: 41.1 % (ref 36.0–46.0)
Hemoglobin: 12.7 g/dL (ref 12.0–15.0)

## 2018-11-21 SURGERY — LAPAROSCOPIC CHOLECYSTECTOMY
Anesthesia: General | Site: Abdomen

## 2018-11-21 MED ORDER — ONDANSETRON HCL 4 MG/2ML IJ SOLN
INTRAMUSCULAR | Status: AC
  Filled 2018-11-21: qty 2

## 2018-11-21 MED ORDER — SUGAMMADEX SODIUM 200 MG/2ML IV SOLN
INTRAVENOUS | Status: AC
  Filled 2018-11-21: qty 2

## 2018-11-21 MED ORDER — FENTANYL CITRATE (PF) 100 MCG/2ML IJ SOLN
INTRAMUSCULAR | Status: DC | PRN
  Administered 2018-11-21 (×2): 100 ug via INTRAVENOUS
  Administered 2018-11-21: 50 ug via INTRAVENOUS
  Administered 2018-11-21: 100 ug via INTRAVENOUS

## 2018-11-21 MED ORDER — MIDAZOLAM HCL 2 MG/2ML IJ SOLN
INTRAMUSCULAR | Status: AC
  Filled 2018-11-21: qty 2

## 2018-11-21 MED ORDER — CIPROFLOXACIN IN D5W 400 MG/200ML IV SOLN
INTRAVENOUS | Status: AC
  Filled 2018-11-21: qty 200

## 2018-11-21 MED ORDER — SUGAMMADEX SODIUM 200 MG/2ML IV SOLN
INTRAVENOUS | Status: DC | PRN
  Administered 2018-11-21: 200 mg via INTRAVENOUS

## 2018-11-21 MED ORDER — BUPIVACAINE HCL (PF) 0.5 % IJ SOLN
INTRAMUSCULAR | Status: DC | PRN
  Administered 2018-11-21: 17 mL

## 2018-11-21 MED ORDER — HYDROMORPHONE HCL 1 MG/ML IJ SOLN
0.2500 mg | INTRAMUSCULAR | Status: DC | PRN
  Administered 2018-11-21: 0.5 mg via INTRAVENOUS

## 2018-11-21 MED ORDER — ONDANSETRON HCL 4 MG/2ML IJ SOLN
INTRAMUSCULAR | Status: DC | PRN
  Administered 2018-11-21: 4 mg via INTRAVENOUS

## 2018-11-21 MED ORDER — ACETAMINOPHEN 10 MG/ML IV SOLN
INTRAVENOUS | Status: AC
  Filled 2018-11-21: qty 100

## 2018-11-21 MED ORDER — FENTANYL CITRATE (PF) 100 MCG/2ML IJ SOLN
INTRAMUSCULAR | Status: AC
  Filled 2018-11-21: qty 2

## 2018-11-21 MED ORDER — ROCURONIUM BROMIDE 100 MG/10ML IV SOLN
INTRAVENOUS | Status: DC | PRN
  Administered 2018-11-21: 40 mg via INTRAVENOUS

## 2018-11-21 MED ORDER — MIDAZOLAM HCL 5 MG/5ML IJ SOLN
INTRAMUSCULAR | Status: DC | PRN
  Administered 2018-11-21: 2 mg via INTRAVENOUS

## 2018-11-21 MED ORDER — PHENYLEPHRINE HCL 10 MG/ML IJ SOLN
INTRAMUSCULAR | Status: DC | PRN
  Administered 2018-11-21: 100 ug via INTRAVENOUS

## 2018-11-21 MED ORDER — MEPERIDINE HCL 50 MG/ML IJ SOLN: 6.2500 mg | INTRAMUSCULAR | Status: DC | PRN

## 2018-11-21 MED ORDER — CHLORHEXIDINE GLUCONATE CLOTH 2 % EX PADS: 6.0000 | MEDICATED_PAD | Freq: Once | CUTANEOUS | Status: DC

## 2018-11-21 MED ORDER — FENTANYL CITRATE (PF) 250 MCG/5ML IJ SOLN
INTRAMUSCULAR | Status: AC
  Filled 2018-11-21: qty 5

## 2018-11-21 MED ORDER — BUPIVACAINE HCL (PF) 0.5 % IJ SOLN
INTRAMUSCULAR | Status: AC
  Filled 2018-11-21: qty 30

## 2018-11-21 MED ORDER — LACTATED RINGERS IV SOLN: INTRAVENOUS | Status: DC

## 2018-11-21 MED ORDER — SCOPOLAMINE 1 MG/3DAYS TD PT72
MEDICATED_PATCH | TRANSDERMAL | Status: AC
  Filled 2018-11-21: qty 1

## 2018-11-21 MED ORDER — SCOPOLAMINE 1 MG/3DAYS TD PT72
1.0000 | MEDICATED_PATCH | TRANSDERMAL | Status: DC
  Administered 2018-11-21: 1.5 mg via TRANSDERMAL
  Filled 2018-11-21: qty 1

## 2018-11-21 MED ORDER — LIDOCAINE HCL (CARDIAC) PF 100 MG/5ML IV SOSY
PREFILLED_SYRINGE | INTRAVENOUS | Status: DC | PRN
  Administered 2018-11-21: 80 mg via INTRAVENOUS

## 2018-11-21 MED ORDER — KETOROLAC TROMETHAMINE 30 MG/ML IJ SOLN
30.0000 mg | Freq: Once | INTRAMUSCULAR | Status: AC | PRN
  Administered 2018-11-21: 30 mg via INTRAVENOUS
  Filled 2018-11-21: qty 1

## 2018-11-21 MED ORDER — PROPOFOL 10 MG/ML IV BOLUS
INTRAVENOUS | Status: AC
  Filled 2018-11-21: qty 20

## 2018-11-21 MED ORDER — SODIUM CHLORIDE 0.9 % IR SOLN
Status: DC | PRN
  Administered 2018-11-21: 3000 mL

## 2018-11-21 MED ORDER — ACETAMINOPHEN 10 MG/ML IV SOLN
1000.0000 mg | Freq: Once | INTRAVENOUS | Status: AC
  Administered 2018-11-21: 1000 mg via INTRAVENOUS

## 2018-11-21 MED ORDER — ONDANSETRON HCL 4 MG/2ML IJ SOLN: 4.0000 mg | Freq: Once | INTRAMUSCULAR | Status: DC | PRN

## 2018-11-21 MED ORDER — SUCCINYLCHOLINE CHLORIDE 200 MG/10ML IV SOSY
PREFILLED_SYRINGE | INTRAVENOUS | Status: DC | PRN
  Administered 2018-11-21: 120 mg via INTRAVENOUS

## 2018-11-21 MED ORDER — SCOPOLAMINE 1 MG/3DAYS TD PT72: 1.0000 | MEDICATED_PATCH | TRANSDERMAL | Status: DC

## 2018-11-21 MED ORDER — PROPOFOL 10 MG/ML IV BOLUS
INTRAVENOUS | Status: DC | PRN
  Administered 2018-11-21: 160 mg via INTRAVENOUS

## 2018-11-21 MED ORDER — HYDROCODONE-ACETAMINOPHEN 7.5-325 MG PO TABS: 1.0000 | ORAL_TABLET | Freq: Once | ORAL | Status: DC | PRN

## 2018-11-21 MED ORDER — ROCURONIUM BROMIDE 10 MG/ML (PF) SYRINGE
PREFILLED_SYRINGE | INTRAVENOUS | Status: AC
  Filled 2018-11-21: qty 10

## 2018-11-21 MED ORDER — 0.9 % SODIUM CHLORIDE (POUR BTL) OPTIME
TOPICAL | Status: DC | PRN
  Administered 2018-11-21: 1000 mL

## 2018-11-21 MED ORDER — HEMOSTATIC AGENTS (NO CHARGE) OPTIME
TOPICAL | Status: DC | PRN
  Administered 2018-11-21 (×2): 1 via TOPICAL

## 2018-11-21 MED ORDER — ALBUTEROL SULFATE (2.5 MG/3ML) 0.083% IN NEBU: 2.5000 mg | INHALATION_SOLUTION | RESPIRATORY_TRACT | Status: DC | PRN

## 2018-11-21 MED ORDER — LIDOCAINE 2% (20 MG/ML) 5 ML SYRINGE
INTRAMUSCULAR | Status: AC
  Filled 2018-11-21: qty 5

## 2018-11-21 SURGICAL SUPPLY — 50 items
ADH SKN CLS APL DERMABOND .7 (GAUZE/BANDAGES/DRESSINGS) ×1
APL SRG 38 LTWT LNG FL B (MISCELLANEOUS) ×1
APPLICATOR ARISTA FLEXITIP XL (MISCELLANEOUS) ×1 IMPLANT
APPLIER CLIP ROT 10 11.4 M/L (STAPLE) ×2
APR CLP MED LRG 11.4X10 (STAPLE) ×1
BAG RETRIEVAL 10 (BASKET) ×1
BLADE SURG 15 STRL LF DISP TIS (BLADE) ×1 IMPLANT
BLADE SURG 15 STRL SS (BLADE) ×2
CHLORAPREP W/TINT 26ML (MISCELLANEOUS) ×2 IMPLANT
CLIP APPLIE ROT 10 11.4 M/L (STAPLE) ×1 IMPLANT
CLOTH BEACON ORANGE TIMEOUT ST (SAFETY) ×2 IMPLANT
COVER LIGHT HANDLE STERIS (MISCELLANEOUS) ×4 IMPLANT
COVER WAND RF STERILE (DRAPES) ×1 IMPLANT
DECANTER SPIKE VIAL GLASS SM (MISCELLANEOUS) ×2 IMPLANT
DERMABOND ADVANCED (GAUZE/BANDAGES/DRESSINGS) ×1
DERMABOND ADVANCED .7 DNX12 (GAUZE/BANDAGES/DRESSINGS) ×1 IMPLANT
ELECT REM PT RETURN 9FT ADLT (ELECTROSURGICAL) ×2
ELECTRODE REM PT RTRN 9FT ADLT (ELECTROSURGICAL) ×1 IMPLANT
FILTER SMOKE EVAC LAPAROSHD (FILTER) ×2 IMPLANT
GLOVE BIO SURGEON STRL SZ 6.5 (GLOVE) ×2 IMPLANT
GLOVE BIO SURGEON STRL SZ7 (GLOVE) ×2 IMPLANT
GLOVE BIOGEL PI IND STRL 6.5 (GLOVE) ×1 IMPLANT
GLOVE BIOGEL PI IND STRL 7.0 (GLOVE) ×3 IMPLANT
GLOVE BIOGEL PI INDICATOR 6.5 (GLOVE) ×1
GLOVE BIOGEL PI INDICATOR 7.0 (GLOVE) ×3
GOWN STRL REUS W/TWL LRG LVL3 (GOWN DISPOSABLE) ×6 IMPLANT
HEMOSTAT ARISTA ABSORB 3G PWDR (HEMOSTASIS) ×1 IMPLANT
HEMOSTAT SNOW SURGICEL 2X4 (HEMOSTASIS) ×2 IMPLANT
INST SET LAPROSCOPIC AP (KITS) ×2 IMPLANT
IV NS IRRIG 3000ML ARTHROMATIC (IV SOLUTION) ×1 IMPLANT
KIT TURNOVER KIT A (KITS) ×2 IMPLANT
MANIFOLD NEPTUNE II (INSTRUMENTS) ×2 IMPLANT
NDL INSUFFLATION 14GA 120MM (NEEDLE) ×1 IMPLANT
NEEDLE INSUFFLATION 14GA 120MM (NEEDLE) ×2 IMPLANT
NS IRRIG 1000ML POUR BTL (IV SOLUTION) ×2 IMPLANT
PACK LAP CHOLE LZT030E (CUSTOM PROCEDURE TRAY) ×2 IMPLANT
PAD ARMBOARD 7.5X6 YLW CONV (MISCELLANEOUS) ×2 IMPLANT
SET BASIN LINEN APH (SET/KITS/TRAYS/PACK) ×2 IMPLANT
SET TUBE IRRIG SUCTION NO TIP (IRRIGATION / IRRIGATOR) ×1 IMPLANT
SLEEVE ENDOPATH XCEL 5M (ENDOMECHANICALS) ×2 IMPLANT
SUT MNCRL AB 4-0 PS2 18 (SUTURE) ×2 IMPLANT
SUT VICRYL 0 UR6 27IN ABS (SUTURE) ×2 IMPLANT
SYS BAG RETRIEVAL 10MM (BASKET) ×1
SYSTEM BAG RETRIEVAL 10MM (BASKET) ×1 IMPLANT
TROCAR ENDO BLADELESS 11MM (ENDOMECHANICALS) ×2 IMPLANT
TROCAR XCEL NON-BLD 5MMX100MML (ENDOMECHANICALS) ×2 IMPLANT
TROCAR XCEL UNIV SLVE 11M 100M (ENDOMECHANICALS) ×2 IMPLANT
TUBE CONNECTING 12X1/4 (SUCTIONS) ×2 IMPLANT
TUBING INSUFFLATION (TUBING) ×2 IMPLANT
WARMER LAPAROSCOPE (MISCELLANEOUS) ×2 IMPLANT

## 2018-11-21 NOTE — Progress Notes (Signed)
[facility]  More bleeding than normal.  Will keep overnight. H&H now and labs in AM. Cardiac monitor.   Curlene Labrum, MD [facility] [address] [place], [place] [address] [phone] (office)

## 2018-11-21 NOTE — Anesthesia Preprocedure Evaluation (Signed)
Anesthesia Evaluation  Patient identified by MRN, date of birth, ID band Patient awake    Reviewed: Allergy & Precautions, H&P , NPO status , Patient's Chart, lab work & pertinent test results  History of Anesthesia Complications (+) PONV, Family history of anesthesia reaction and history of anesthetic complications  Airway Mallampati: II  TM Distance: >3 FB Neck ROM: full    Dental no notable dental hx.    Pulmonary shortness of breath, pneumonia, former smoker,    Pulmonary exam normal breath sounds clear to auscultation       Cardiovascular Exercise Tolerance: Good hypertension, + dysrhythmias  Rhythm:regular Rate:Normal     Neuro/Psych  Headaches, PSYCHIATRIC DISORDERS Anxiety Depression    GI/Hepatic Neg liver ROS, Nausea with recent cholecystitis   Endo/Other  Morbid obesity  Renal/GU Renal disease  negative genitourinary   Musculoskeletal   Abdominal   Peds  Hematology negative hematology ROS (+)   Anesthesia Other Findings Sister allergic to "sccinocholine" - unable to come off ventilator  Reproductive/Obstetrics negative OB ROS                             Anesthesia Physical Anesthesia Plan  ASA: III  Anesthesia Plan: General   Post-op Pain Management:    Induction:   PONV Risk Score and Plan:   Airway Management Planned:   Additional Equipment:   Intra-op Plan:   Post-operative Plan:   Informed Consent: I have reviewed the patients History and Physical, chart, labs and discussed the procedure including the risks, benefits and alternatives for the proposed anesthesia with the patient or authorized representative who has indicated his/her understanding and acceptance.     Dental Advisory Given  Plan Discussed with: CRNA  Anesthesia Plan Comments:         Anesthesia Quick Evaluation

## 2018-11-21 NOTE — Anesthesia Postprocedure Evaluation (Signed)
Anesthesia Post Note  Patient: Jennifer Coleman  Procedure(s) Performed: LAPAROSCOPIC CHOLECYSTECTOMY (N/A Abdomen)  Patient location during evaluation: PACU Anesthesia Type: General Level of consciousness: awake and alert Pain management: pain level controlled Vital Signs Assessment: post-procedure vital signs reviewed and stable Respiratory status: spontaneous breathing and patient connected to face mask oxygen Cardiovascular status: stable Postop Assessment: no apparent nausea or vomiting Anesthetic complications: no     Last Vitals:  Vitals:   11/21/18 1313 11/21/18 1518  BP: 136/60   Pulse: 88   Resp: 16   Temp: 37.1 C 37.5 C  SpO2: 97%     Last Pain:  Vitals:   11/21/18 1313  TempSrc: Oral  PainSc: 5                  Kyrin Gratz

## 2018-11-21 NOTE — Op Note (Signed)
Operative Note   Preoperative Diagnosis: Acute Cholecystitis    Postoperative Diagnosis: Same   Procedure(s) Performed: Laparoscopic cholecystectomy   Surgeon: Ria Comment C. Constance Haw, MD   Assistants: No qualified resident was available   Anesthesia: General endotracheal   Anesthesiologist: Nicanor Alcon, MD    Specimens: Gallbladder    Estimated Blood Loss: 300cc blood    Blood Replacement: None    Complications: None    Operative Findings: Inflamed and infected gallbladder; bleeding from cystic artery re-clipped and stopped    Procedure: The patient was taken to the operating room and placed supine. General endotracheal anesthesia was induced. Intravenous antibiotics were administered per protocol. An orogastric tube positioned to decompress the stomach. The abdomen was prepared and draped in the usual sterile fashion.    A supraumbilical incision was made and a Veress technique was utilized to achieve pneumoperitoneum to 15 mmHg with carbon dioxide. A 11 mm optiview port was placed through the supraumbilical region, and a 10 mm 0-degree operative laparoscope was introduced. The area underlying the trocar and Veress needle were inspected and without evidence of injury.  Remaining trocars were placed under direct vision. Two 5 mm ports were placed in the right abdomen, between the anterior axillary and midclavicular line.  A final 11 mm port was placed through the mid-epigastrium, near the falciform ligament.    The gallbladder was thick and edematous. It could not be grasped, it was decompressed and suctioned.  The gallbladder fundus was elevated cephalad and the infundibulum was retracted to the patient's right. The gallbladder/cystic duct junction was skeletonized. The cystic artery noted in the triangle of Calot and was also skeletonized.  We then continued liberal medial and lateral dissection until the critical view of safety was achieved.     The cystic duct and cystic artery  were triply clipped and divided. The gallbladder was then dissected from the liver bed with electrocautery.  On finishing the dissection from the gallbladder bed there was more blood inferiorly than normal.  I inspected this area and suctioned.  The duct clips were on and it looked as if at least 1 of the artery clips had fallen off. The area of pulsatile bleeding was found. This was grasped with a mayo, and additional clips were applied.  This stopped the bleeding. The area was irrigated and suctioned again, and the clips were in place and no bleeding was noted.  The specimen was placed in an Endopouch and was retrieved through the epigastric site.   Final inspection revealed acceptable hemostasis. Surgical Snow and Loletta Parish was placed in the gallbladder bed. 0 Vicryl fascial sutures were used to close the epigastric and umbilical port sites. Trocars were removed and pneumoperitoneum was released. Skin incisions were closed with 4-0 Monocryl subcuticular sutures and Dermabond. The patient was awakened from anesthesia and extubated without complication.  Given the bleeding, I will send an H&H post operatively and will monitor the patient overnight and repeat labs in the AM.    Curlene Labrum, MD [facility] [place], [place] [address] [phone] (office)

## 2018-11-21 NOTE — ED Notes (Signed)
Consent completed

## 2018-11-21 NOTE — H&P (Signed)
[facility] History and Physical  Reason for Referral: Acute Cholecystitis  Referring Physician:  Dr. Lita Mains   Chief Complaint    Abdominal Pain      Jennifer Coleman is a 68 y.o. female.  HPI: Jennifer Coleman is a 68 yo with a history of HTN, anxiety who presents with acute onset of right upper quadrant pain and epigastric pain.  She reports that she has had nausea/vomiting, and that the vomiting was bilious in nature. She tried to have a BM to help her pain but this did not resolve the pain. She decided to come to the ED due to her pain. She had a gallbladder attack 18 years ago, and thinks that she has had some pain about 1 time per year.    She continues to have pain in this region. The pain medications have helped some since she was in the ED. Denise any fevers or chest pain or SOB.   Past Medical History:  Diagnosis Date  . Anxiety   . Arthritis   . Dyspnea   . Dysrhythmia   . Family history of anesthesia complication 25 yrs ago   Sister allergic to sccinocholine - unable to come off ventilator  . Hypertension   . Pneumonia   . PONV (postoperative nausea and vomiting)   . Premature ventricular contractions   . Shingles   . Varicose veins   . Varicose veins of left lower extremity     Past Surgical History:  Procedure Laterality Date  . CARPAL TUNNEL RELEASE    . CERVICAL DISC SURGERY  1992   Fusion  . COLONOSCOPY N/A 06/07/2016   Procedure: COLONOSCOPY;  Surgeon: Rogene Houston, MD;  [facility];  Service: Endoscopy;  Laterality: N/A;  1:00  . KNEE ARTHROSCOPY  09/16/2012   Procedure: ARTHROSCOPY KNEE;  Surgeon: [name], MD;  Location: [place];  Service: Orthopedics;  Laterality: Right;  Right Knee Arthroscopy  . SALPINGOOPHORECTOMY Left 1985  . SHOULDER ARTHROSCOPY WITH OPEN ROTATOR CUFF REPAIR AND DISTAL CLAVICLE ACROMINECTOMY Right 05/03/2016   Procedure: RIGHT SHOULDER ARTHROSCOPY WITH MINI-OPEN ROTATOR CUFF REPAIR,DISTAL CLAVICLE  RESECTION, SUBACROMIAL DECOMPRESSION.;  Surgeon: [name], MD;  Location: [place];  Service: Orthopedics;  Laterality: Right;  . SHOULDER ARTHROSCOPY WITH ROTATOR CUFF REPAIR Right 09/25/2016   Procedure: Right Shoulder Manipulation, Arthroscopic Debridement of Joint, Removal of Loose Body and Re-Repair of Rotator Cuff;  Surgeon: [name], MD;  Location: [place];  Service: Orthopedics;  Laterality: Right;    Family History  Problem Relation Age of Onset  . Heart disease Sister   . Heart disease Brother        65's  . Diabetes Mellitus II Sister   . Multiple myeloma Mother   . Hypertension Sister   . Hypertension Father   . Heart attack Father     Social History   Tobacco Use  . Smoking status: Former Smoker    Types: Cigarettes    Last attempt to quit: 09/22/1992    Years since quitting: 26.1  . Smokeless tobacco: Never Used  Substance Use Topics  . Alcohol use: No    Alcohol/week: 0.0 standard drinks  . Drug use: No    Medications:  I have reviewed the patient's current medications. Prior to Admission:  Medications Prior to Admission  Medication Sig Dispense Refill Last Dose  . PROAIR HFA 108 (90 Base) MCG/ACT inhaler INHALE TWO PUFFS BY MOUTH EVERY 4 TO 6  HOURS AS NEEDED FOR FOR WHEEZING 1 Inhaler 0 Past Month at Unknown time  . Calcium Carbonate-Vit D-Min (GNP CALCIUM PLUS 600 +D PO) Take 1 tablet by mouth daily.    Unknown at Unknown time  . cetirizine (ZYRTEC) 10 MG tablet Take 10 mg by mouth daily as needed for allergies.    Unknown at Unknown time  . enalapril (VASOTEC) 20 MG tablet Take 1 tablet (20 mg total) by mouth 2 (two) times daily. 180 tablet 1 Unknown at Unknown time  . escitalopram (LEXAPRO) 20 MG tablet Take 1 tablet (20 mg total) by mouth daily. 90 tablet 1 Unknown at Unknown time  . fluticasone (FLONASE) 50 MCG/ACT nasal spray Place 2 sprays into both nostrils daily. 16 g 11 Unknown at Unknown time  . Multiple  Vitamins-Minerals (ONE-A-DAY WOMENS 50 PLUS PO) Take 1 tablet by mouth daily.   Unknown at Unknown time  . oxyCODONE (OXY IR/ROXICODONE) 5 MG immediate release tablet Take 1 tablet (5 mg total) by mouth every 4 (four) hours as needed for severe pain. (Patient not taking: Reported on 10/15/2018) 30 tablet 0 Unknown at Unknown time   Scheduled: . Chlorhexidine Gluconate Cloth  6 each Topical Once  . docusate sodium  100 mg Oral BID  . enalapril  20 mg Oral BID  . escitalopram  20 mg Oral Daily  . fluticasone  2 spray Each Nare Daily  . pantoprazole (PROTONIX) IV  40 mg Intravenous QHS  . scopolamine  1 patch Transdermal Q72H   Continuous: . ciprofloxacin Stopped (11/21/18 0124)  . ciprofloxacin    . lactated ringers 50 mL/hr at 11/20/18 2354   FWY:OVZCHYIFOYDXA **OR** acetaminophen, [MAR Hold] albuterol, diphenhydrAMINE **OR** diphenhydrAMINE, HYDROmorphone (DILAUDID) injection, metoprolol tartrate, ondansetron **OR** ondansetron (ZOFRAN) IV, oxyCODONE, simethicone, zolpidem  Allergies  Allergen Reactions  . Penicillins Shortness Of Breath, Swelling, Rash and Other (See Comments)    Patient tolerates Cephalosporins   . Shellfish Allergy Shortness Of Breath and Swelling  . Acyclovir And Related Other (See Comments)    UNSPECIFIED REACTION   . Codeine Itching  . Morphine And Related Nausea And Vomiting     ROS:  A comprehensive review of systems was negative except for: Gastrointestinal: positive for abdominal pain, nausea and vomiting  Blood pressure (!) 110/54, pulse 72, temperature 98.9 F (37.2 C), temperature source Oral, resp. rate 18, height '5\' 5"'$  (1.651 m), weight 113.4 kg, SpO2 92 %. Physical Exam Constitutional:      Appearance: She is well-developed.  HENT:     Head: Normocephalic and atraumatic.  Eyes:     Extraocular Movements: Extraocular movements intact.  Cardiovascular:     Rate and Rhythm: Normal rate and regular rhythm.  Pulmonary:     Effort: Pulmonary  effort is normal.     Breath sounds: Normal breath sounds.  Abdominal:     General: Abdomen is flat.     Palpations: Abdomen is soft.     Tenderness: There is abdominal tenderness in the right upper quadrant and epigastric area.  Musculoskeletal: Normal range of motion.     Right lower leg: No edema.     Left lower leg: No edema.  Skin:    General: Skin is warm and dry.  Neurological:     General: No focal deficit present.     Mental Status: She is alert and oriented to person, place, and time.  Psychiatric:        Mood and Affect: Mood normal.  Behavior: Behavior normal.     Results: Results for orders placed or performed during the hospital encounter of 11/20/18 (from the past 48 hour(s))  Urinalysis, Routine w reflex microscopic     Status: Abnormal   Collection Time: 11/20/18  6:00 PM  Result Value Ref Range   Color, Urine YELLOW YELLOW   APPearance HAZY (A) CLEAR   Specific Gravity, Urine 1.025 1.005 - 1.030   pH 5.0 5.0 - 8.0   Glucose, UA NEGATIVE NEGATIVE mg/dL   Hgb urine dipstick MODERATE (A) NEGATIVE   Bilirubin Urine NEGATIVE NEGATIVE   Ketones, ur NEGATIVE NEGATIVE mg/dL   Protein, ur NEGATIVE NEGATIVE mg/dL   Nitrite NEGATIVE NEGATIVE   Leukocytes, UA NEGATIVE NEGATIVE   RBC / HPF 0-5 0 - 5 RBC/hpf   WBC, UA 0-5 0 - 5 WBC/hpf   Bacteria, UA RARE (A) NONE SEEN   Squamous Epithelial / LPF 0-5 0 - 5   Mucus PRESENT     Comment: Performed at [facility], [address] [place], [place] [address]  Lipase, blood     Status: None   Collection Time: 11/20/18  6:05 PM  Result Value Ref Range   Lipase 25 11 - 51 U/L    Comment: Performed at [facility], [address] [place], [place] [address]  Comprehensive metabolic panel     Status: Abnormal   Collection Time: 11/20/18  6:05 PM  Result Value Ref Range   Sodium 140 135 - 145 mmol/L   Potassium 3.6 3.5 - 5.1 mmol/L   Chloride 106 98 - 111 mmol/L   CO2 25 22 - 32 mmol/L   Glucose, Bld 116 (H) 70 - 99  mg/dL   BUN 25 (H) 8 - 23 mg/dL   Creatinine, Ser 0.81 0.44 - 1.00 mg/dL   Calcium 10.0 8.9 - 10.3 mg/dL   Total Protein 7.2 6.5 - 8.1 g/dL   Albumin 3.9 3.5 - 5.0 g/dL   AST 24 15 - 41 U/L   ALT 19 0 - 44 U/L   Alkaline Phosphatase 74 38 - 126 U/L   Total Bilirubin 0.7 0.3 - 1.2 mg/dL   GFR calc non Af Amer >60 >60 mL/min   GFR calc Af Amer >60 >60 mL/min   Anion gap 9 5 - 15    Comment: Performed at [facility], [address], [place], [place] [address]  CBC     Status: Abnormal   Collection Time: 11/20/18  6:05 PM  Result Value Ref Range   WBC 12.6 (H) 4.0 - 10.5 K/uL   RBC 4.38 3.87 - 5.11 MIL/uL   Hemoglobin 13.5 12.0 - 15.0 g/dL   HCT 42.9 36.0 - 46.0 %   MCV 97.9 80.0 - 100.0 fL   MCH 30.8 26.0 - 34.0 pg   MCHC 31.5 30.0 - 36.0 g/dL   RDW 12.3 11.5 - 15.5 %   Platelets 287 150 - 400 K/uL   nRBC 0.0 0.0 - 0.2 %    Comment: Performed at [facility], [address] [place], [place] [address]   Personally reviewed- large distended gallbladder with stone and edema around the gallbladder. Biliary system without dilation  Ct Abdomen Pelvis W Contrast  Result Date: 11/20/2018 CLINICAL DATA:  Right upper quadrant pain EXAM: CT ABDOMEN AND PELVIS WITH CONTRAST TECHNIQUE: Multidetector CT imaging of the abdomen and pelvis was performed using the standard protocol following bolus administration of intravenous contrast. CONTRAST:  136m ISOVUE-300 IOPAMIDOL (ISOVUE-300) INJECTION 61% COMPARISON:  10/04/2005 FINDINGS: Lower chest: Lung bases demonstrate no acute consolidation or effusion. The heart size is within normal limits. Small hiatal hernia. Hepatobiliary: No focal hepatic abnormality. Increased density in the gallbladder lumen suspicious for stones. Pericholecystic fluid and suspected wall thickening of the gallbladder. No biliary dilatation Pancreas: Unremarkable. No pancreatic ductal dilatation or surrounding inflammatory changes. Spleen: Normal in size without focal  abnormality. Adrenals/Urinary Tract: Adrenal glands are normal. No hydronephrosis. Subcentimeter hypodensity in the left kidney too small to further characterize. Bladder normal Stomach/Bowel: Stomach is within normal limits. Appendix appears normal. No evidence of bowel wall thickening, distention, or inflammatory changes. 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 Vascular/Lymphatic: Nonaneurysmal aorta. No significantly enlarged lymph nodes. Reproductive: Uterus and bilateral adnexa are unremarkable. Other: Negative for free air or free fluid. Musculoskeletal: No acute or significant osseous findings. IMPRESSION: 1. Increased density in the gallbladder lumen, suspect for stones. There appears to be gallbladder wall thickening and small adjacent pericholecystic fluid, findings are concerning for an acute cholecystitis. This may be further evaluated with ultrasound. 2. 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 Electronically Signed   By: Donavan Foil M.D.   On: 11/20/2018 22:27     Assessment & Plan:  Jennifer Coleman is a 68 y.o. female with acute cholecystitis on CT presenting with pain and leukocytosis.   Lap cholecystectomy today   PLAN: I counseled the patient about the indication, risks and benefits of laparoscopic cholecystectomy.  She understands there is a very small chance for bleeding, infection, injury to normal structures (including common bile duct), conversion to open surgery, persistent symptoms, evolution of postcholecystectomy diarrhea, need for secondary interventions, anesthesia reaction, cardiopulmonary issues and other risks not specifically detailed here. I described the expected recovery, the plan for follow-up and the restrictions during the recovery phase.  All questions were answered.    Virl Cagey 11/21/2018, 9:34 AM

## 2018-11-21 NOTE — Progress Notes (Signed)
[facility]  Checked on patient when arrived to floor. Doing well. Monitoring. Told her H&H down about 1 point as expected. Labs in the [place], [place] [facility] [address] [place], [place] [address] [phone] (office)

## 2018-11-21 NOTE — Anesthesia Procedure Notes (Signed)
Procedure Name: Intubation Date/Time: 11/21/2018 2:03 PM Performed by: Jonna Munro, CRNA Pre-anesthesia Checklist: Patient identified, Emergency Drugs available, Suction available, Patient being monitored and Timeout performed Patient Re-evaluated:Patient Re-evaluated prior to induction Oxygen Delivery Method: Circle system utilized Preoxygenation: Pre-oxygenation with 100% oxygen Induction Type: Rapid sequence, Cricoid Pressure applied and IV induction Laryngoscope Size: Mac and 3 Grade View: Grade I Tube type: Oral Tube size: 7.0 mm Number of attempts: 1 Airway Equipment and Method: Stylet Placement Confirmation: ETT inserted through vocal cords under direct vision,  positive ETCO2 and breath sounds checked- equal and bilateral Secured at: 22 cm Tube secured with: Tape Dental Injury: Teeth and Oropharynx as per pre-operative assessment

## 2018-11-21 NOTE — Transfer of Care (Signed)
Immediate Anesthesia Transfer of Care Note  Patient: Jennifer Coleman  Procedure(s) Performed: LAPAROSCOPIC CHOLECYSTECTOMY (N/A Abdomen)  Patient Location: PACU  Anesthesia Type:General  Level of Consciousness: awake, alert  and oriented  Airway & Oxygen Therapy: Patient Spontanous Breathing and Patient connected to nasal cannula oxygen  Post-op Assessment: Report given to RN and Post -op Vital signs reviewed and stable  Post vital signs: Reviewed and stable  Last Vitals:  Vitals Value Taken Time  BP    Temp    Pulse 96 11/21/2018  3:18 PM  Resp    SpO2 77 % 11/21/2018  3:18 PM  Vitals shown include unvalidated device data.  Last Pain:  Vitals:   11/21/18 1313  TempSrc: Oral  PainSc: 5       Patients Stated Pain Goal: 7 (51/83/43 7357)  Complications: No apparent anesthesia complications

## 2018-11-22 LAB — CBC
HCT: 35.7 % — ABNORMAL LOW (ref 36.0–46.0)
Hemoglobin: 11.3 g/dL — ABNORMAL LOW (ref 12.0–15.0)
MCH: 31.5 pg (ref 26.0–34.0)
MCHC: 31.7 g/dL (ref 30.0–36.0)
MCV: 99.4 fL (ref 80.0–100.0)
Platelets: 197 10*3/uL (ref 150–400)
RBC: 3.59 MIL/uL — ABNORMAL LOW (ref 3.87–5.11)
RDW: 12.3 % (ref 11.5–15.5)
WBC: 9.4 10*3/uL (ref 4.0–10.5)
nRBC: 0 % (ref 0.0–0.2)

## 2018-11-22 LAB — CBC WITH DIFFERENTIAL/PLATELET
Abs Immature Granulocytes: 0.03 10*3/uL (ref 0.00–0.07)
Basophils Absolute: 0 10*3/uL (ref 0.0–0.1)
Basophils Relative: 0 %
Eosinophils Absolute: 0.1 10*3/uL (ref 0.0–0.5)
Eosinophils Relative: 1 %
HEMATOCRIT: 38.5 % (ref 36.0–46.0)
Hemoglobin: 11.7 g/dL — ABNORMAL LOW (ref 12.0–15.0)
Immature Granulocytes: 0 %
Lymphocytes Relative: 22 %
Lymphs Abs: 2.1 10*3/uL (ref 0.7–4.0)
MCH: 30.6 pg (ref 26.0–34.0)
MCHC: 30.4 g/dL (ref 30.0–36.0)
MCV: 100.8 fL — ABNORMAL HIGH (ref 80.0–100.0)
Monocytes Absolute: 1.2 10*3/uL — ABNORMAL HIGH (ref 0.1–1.0)
Monocytes Relative: 12 %
Neutro Abs: 6.2 10*3/uL (ref 1.7–7.7)
Neutrophils Relative %: 65 %
Platelets: 175 10*3/uL (ref 150–400)
RBC: 3.82 MIL/uL — ABNORMAL LOW (ref 3.87–5.11)
RDW: 12.6 % (ref 11.5–15.5)
WBC: 9.6 10*3/uL (ref 4.0–10.5)
nRBC: 0 % (ref 0.0–0.2)

## 2018-11-22 LAB — HIV ANTIBODY (ROUTINE TESTING W REFLEX): HIV Screen 4th Generation wRfx: NONREACTIVE

## 2018-11-22 NOTE — Progress Notes (Signed)
[facility] Progress Note  1 Day Post-Op  Subjective: Hurting this AM. H&H drifted more. Repeat stable. Feeling flush too, RN reported fever.   Objective: Vital signs in last 24 hours: Temp:  [98.2 F (36.8 C)-101 F (38.3 C)] 98.8 F (37.1 C) (01/25 1558) Pulse Rate:  [72-97] 90 (01/25 1558) Resp:  [14-17] 16 (01/25 1558) BP: (97-117)/(48-61) 101/48 (01/25 1558) SpO2:  [92 %-95 %] 94 % (01/25 1558)    Intake/Output from previous day: 01/24 0701 - 01/25 0700 In: 2100.7 [P.O.:460; I.V.:1640.7] Out: 300 [Blood:300] Intake/Output this shift: Total I/O In: 480 [P.O.:480] Out: -   General appearance: alert, cooperative and no distress Resp: normal work breathiung GI: soft, minimally distended, appropriately tender, port sites clean and intact, bruising   Lab Results:  Recent Labs    11/22/18 0635 11/22/18 1233  WBC 9.6 9.4  HGB 11.7* 11.3*  HCT 38.5 35.7*  PLT 175 197   BMET Recent Labs    11/20/18 1805  NA 140  K 3.6  CL 106  CO2 25  GLUCOSE 116*  BUN 25*  CREATININE 0.81  CALCIUM 10.0   PT/INR No results for input(s): LABPROT, INR in the last 72 hours.  Studies/Results: Ct Abdomen Pelvis W Contrast  Result Date: 11/20/2018 CLINICAL DATA:  Right upper quadrant pain EXAM: CT ABDOMEN AND PELVIS WITH CONTRAST TECHNIQUE: Multidetector CT imaging of the abdomen and pelvis was performed using the standard protocol following bolus administration of intravenous contrast. CONTRAST:  184mL ISOVUE-300 IOPAMIDOL (ISOVUE-300) INJECTION 61% COMPARISON:  10/04/2005 FINDINGS: Lower chest: Lung bases demonstrate no acute consolidation or effusion. The heart size is within normal limits. Small hiatal hernia. Hepatobiliary: No focal hepatic abnormality. Increased density in the gallbladder lumen suspicious for stones. Pericholecystic fluid and suspected wall thickening of the gallbladder. No biliary dilatation Pancreas: Unremarkable. No pancreatic ductal  dilatation or surrounding inflammatory changes. Spleen: Normal in size without focal abnormality. Adrenals/Urinary Tract: Adrenal glands are normal. No hydronephrosis. Subcentimeter hypodensity in the left kidney too small to further characterize. Bladder normal Stomach/Bowel: Stomach is within normal limits. Appendix appears normal. No evidence of bowel wall thickening, distention, or inflammatory changes. 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 Vascular/Lymphatic: Nonaneurysmal aorta. No significantly enlarged lymph nodes. Reproductive: Uterus and bilateral adnexa are unremarkable. Other: Negative for free air or free fluid. Musculoskeletal: No acute or significant osseous findings. IMPRESSION: 1. Increased density in the gallbladder lumen, suspect for stones. There appears to be gallbladder wall thickening and small adjacent pericholecystic fluid, findings are concerning for an acute cholecystitis. This may be further evaluated with ultrasound. 2. 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 Electronically Signed   By: Donavan Foil M.D.   On: 11/20/2018 22:27    Anti-infectives: Anti-infectives (From admission, onward)   Start     Dose/Rate Route Frequency Ordered Stop   11/21/18 0600  ciprofloxacin (CIPRO) IVPB 400 mg     400 mg 200 mL/hr over 60 Minutes Intravenous On call to O.R. 11/20/18 2256 11/21/18 1422   11/20/18 2300  ciprofloxacin (CIPRO) IVPB 400 mg  Status:  Discontinued     400 mg 200 mL/hr over 60 Minutes Intravenous Every 12 hours 11/20/18 2256 11/21/18 1618      Assessment/Plan: Jennifer Coleman is a 68 yo s/p Lap chole for acute cholecystitis. Kept overnight due to bleeding intraoperatively, H&H stabilized out. Patient with more pain and nausea likely due to infection from gallbladder and some from inflammation from bleeding.  PRN For pain  IS, OOB Ambulate, holding any prophylactic anticoagulation due to ambulation and bleeding   Labs in AM  Diet as tolerated    LOS: 0 days    Virl Cagey 11/22/2018

## 2018-11-22 NOTE — Progress Notes (Signed)
Patient ambulated in hallway with nursing staff. Patient complains of mild pain but states that she knows that is expected. Patient offers no others complaints at this time.

## 2018-11-23 DIAGNOSIS — K810 Acute cholecystitis: Secondary | ICD-10-CM | POA: Diagnosis present

## 2018-11-23 DIAGNOSIS — Z880 Allergy status to penicillin: Secondary | ICD-10-CM | POA: Diagnosis not present

## 2018-11-23 DIAGNOSIS — Z87891 Personal history of nicotine dependence: Secondary | ICD-10-CM | POA: Diagnosis not present

## 2018-11-23 DIAGNOSIS — Z7951 Long term (current) use of inhaled steroids: Secondary | ICD-10-CM | POA: Diagnosis not present

## 2018-11-23 DIAGNOSIS — Z888 Allergy status to other drugs, medicaments and biological substances status: Secondary | ICD-10-CM | POA: Diagnosis not present

## 2018-11-23 DIAGNOSIS — Z91013 Allergy to seafood: Secondary | ICD-10-CM | POA: Diagnosis not present

## 2018-11-23 DIAGNOSIS — F419 Anxiety disorder, unspecified: Secondary | ICD-10-CM | POA: Diagnosis present

## 2018-11-23 DIAGNOSIS — Z79899 Other long term (current) drug therapy: Secondary | ICD-10-CM | POA: Diagnosis not present

## 2018-11-23 DIAGNOSIS — Z885 Allergy status to narcotic agent status: Secondary | ICD-10-CM | POA: Diagnosis not present

## 2018-11-23 DIAGNOSIS — M1990 Unspecified osteoarthritis, unspecified site: Secondary | ICD-10-CM | POA: Diagnosis present

## 2018-11-23 DIAGNOSIS — Z8249 Family history of ischemic heart disease and other diseases of the circulatory system: Secondary | ICD-10-CM | POA: Diagnosis not present

## 2018-11-23 DIAGNOSIS — Z6841 Body Mass Index (BMI) 40.0 and over, adult: Secondary | ICD-10-CM | POA: Diagnosis not present

## 2018-11-23 DIAGNOSIS — R1011 Right upper quadrant pain: Secondary | ICD-10-CM | POA: Diagnosis present

## 2018-11-23 DIAGNOSIS — I10 Essential (primary) hypertension: Secondary | ICD-10-CM | POA: Diagnosis present

## 2018-11-23 DIAGNOSIS — D62 Acute posthemorrhagic anemia: Secondary | ICD-10-CM | POA: Diagnosis not present

## 2018-11-23 LAB — CBC WITH DIFFERENTIAL/PLATELET
Abs Immature Granulocytes: 0.03 10*3/uL (ref 0.00–0.07)
Basophils Absolute: 0 10*3/uL (ref 0.0–0.1)
Basophils Relative: 0 %
EOS ABS: 0.1 10*3/uL (ref 0.0–0.5)
Eosinophils Relative: 1 %
HCT: 31 % — ABNORMAL LOW (ref 36.0–46.0)
Hemoglobin: 9.7 g/dL — ABNORMAL LOW (ref 12.0–15.0)
Immature Granulocytes: 0 %
LYMPHS ABS: 2 10*3/uL (ref 0.7–4.0)
Lymphocytes Relative: 24 %
MCH: 30.9 pg (ref 26.0–34.0)
MCHC: 31.3 g/dL (ref 30.0–36.0)
MCV: 98.7 fL (ref 80.0–100.0)
Monocytes Absolute: 0.8 10*3/uL (ref 0.1–1.0)
Monocytes Relative: 10 %
Neutro Abs: 5.4 10*3/uL (ref 1.7–7.7)
Neutrophils Relative %: 65 %
Platelets: 174 10*3/uL (ref 150–400)
RBC: 3.14 MIL/uL — ABNORMAL LOW (ref 3.87–5.11)
RDW: 12.4 % (ref 11.5–15.5)
WBC: 8.4 10*3/uL (ref 4.0–10.5)
nRBC: 0 % (ref 0.0–0.2)

## 2018-11-23 LAB — COMPREHENSIVE METABOLIC PANEL
ALK PHOS: 88 U/L (ref 38–126)
ALT: 56 U/L — ABNORMAL HIGH (ref 0–44)
AST: 42 U/L — ABNORMAL HIGH (ref 15–41)
Albumin: 2.6 g/dL — ABNORMAL LOW (ref 3.5–5.0)
Anion gap: 6 (ref 5–15)
BUN: 16 mg/dL (ref 8–23)
CO2: 27 mmol/L (ref 22–32)
Calcium: 9 mg/dL (ref 8.9–10.3)
Chloride: 104 mmol/L (ref 98–111)
Creatinine, Ser: 0.76 mg/dL (ref 0.44–1.00)
GFR calc Af Amer: >60 mL/min (ref >60)
GFR calc non Af Amer: >60 mL/min (ref >60)
Glucose, Bld: 108 mg/dL — ABNORMAL HIGH (ref 70–99)
Potassium: 3.4 mmol/L — ABNORMAL LOW (ref 3.5–5.1)
SODIUM: 137 mmol/L (ref 135–145)
Total Bilirubin: 1.1 mg/dL (ref 0.3–1.2)
Total Protein: 5.3 g/dL — ABNORMAL LOW (ref 6.5–8.1)

## 2018-11-23 LAB — PREPARE RBC (CROSSMATCH)

## 2018-11-23 LAB — HEMOGLOBIN AND HEMATOCRIT, BLOOD
HCT: 32.4 % — ABNORMAL LOW (ref 36.0–46.0)
Hemoglobin: 10.2 g/dL — ABNORMAL LOW (ref 12.0–15.0)

## 2018-11-23 LAB — ABO/RH: ABO/RH(D): O POS

## 2018-11-23 LAB — PROTIME-INR
INR: 1.08
Prothrombin Time: 13.9 seconds (ref 11.4–15.2)

## 2018-11-23 MED ORDER — SODIUM CHLORIDE 0.9% IV SOLUTION: Freq: Once | INTRAVENOUS | Status: DC

## 2018-11-23 NOTE — Progress Notes (Addendum)
[facility] Progress Note  2 Days Post-Op  Subjective: Feeling better. Ambulating. Says pain is better. No tachycardia on monitor. H&H drifting still. Spiking some fevers.   Objective: Vital signs in last 24 hours: Temp:  [98.8 F (37.1 C)-101.9 F (38.8 C)] 99.3 F (37.4 C) (01/26 0800) Pulse Rate:  [70-93] 77 (01/26 0800) Resp:  [16-18] 17 (01/26 0800) BP: (99-121)/(47-70) 99/47 (01/26 0800) SpO2:  [91 %-100 %] 91 % (01/26 0800)    Intake/Output from previous day: 01/25 0701 - 01/26 0700 In: 720 [P.O.:720] Out: -  Intake/Output this shift: No intake/output data recorded.  General appearance: alert, cooperative and no distress Resp: normal work of breathing GI: soft, nondistended, appropriately tender, port sites clean with dermabond, bruising at epigastric site  Lab Results:  Recent Labs    11/22/18 1233 11/23/18 0616  WBC 9.4 8.4  HGB 11.3* 9.7*  HCT 35.7* 31.0*  PLT 197 174   BMET Recent Labs    11/20/18 1805 11/23/18 0616  NA 140 137  K 3.6 3.4*  CL 106 104  CO2 25 27  GLUCOSE 116* 108*  BUN 25* 16  CREATININE 0.81 0.76  CALCIUM 10.0 9.0   Assessment/Plan: Ms. Poland is a 68 yo POD 2 s/p lap chole for acute cholecystitis with intraoperative bleed. H&H had stablized but now drifted down more. Some of this is from equilibration and some is dilution due to fluids. No tachycardia. Soft BP but no real Hypotension.  PRN for pain IS, OOB Ambulate Diet Repeat H&H and INR @ 1600 Have Cross and consented for blood pending need to give any Discussed that taking back for bleeding for gallbladder is extremely rare and bleeds usually stop on their own, this is more of an oozing bleed. Will get the labs and cross just in case.  Off monitor since no tachycardia and about 48 hrs post op, vitals every 4 hours  RN updated and to notify if any changes or concerns Shower is fine   Discussed with Dr. Arnoldo Morale and he agrees, continue to monitor for now  with serial labs. Should stabilize out.   LOS: 0 days    Virl Cagey 11/23/2018

## 2018-11-23 NOTE — Progress Notes (Signed)
Inr normal and h&h stable.  Will keep overnight. Home tomorrow.  Jennifer Coleman

## 2018-11-24 ENCOUNTER — Telehealth (HOSPITAL_COMMUNITY): Payer: Self-pay | Admitting: Family Medicine

## 2018-11-24 ENCOUNTER — Encounter (HOSPITAL_COMMUNITY): Payer: Self-pay | Admitting: General Surgery

## 2018-11-24 DIAGNOSIS — D62 Acute posthemorrhagic anemia: Secondary | ICD-10-CM

## 2018-11-24 MED ORDER — ONDANSETRON 4 MG PO TBDP: 4.0000 mg | ORAL_TABLET | Freq: Four times a day (QID) | ORAL | 0 refills | Status: DC | PRN

## 2018-11-24 MED ORDER — OXYCODONE HCL 5 MG PO TABS: 5.0000 mg | ORAL_TABLET | ORAL | 0 refills | Status: DC | PRN

## 2018-11-24 MED ORDER — DOCUSATE SODIUM 100 MG PO CAPS: 100.0000 mg | ORAL_CAPSULE | Freq: Two times a day (BID) | ORAL | 0 refills | Status: DC

## 2018-11-24 NOTE — Discharge Summary (Signed)
Physician Discharge Summary  Patient ID: Jennifer Coleman MRN: [id number] DOB/AGE: Nov 14, 1950 68 y.o.  Admit date: 11/20/2018 Discharge date: 11/24/2018  Admission Diagnoses: Acute cholecystitis   Discharge Diagnoses:  Principal Problem:   Acute cholecystitis Active Problems:   Postoperative anemia due to acute blood loss   Discharged Condition: good  Hospital Course: [name] is a 68 yo with acute cholecystitis that came to the [facility] and underwent her operation on 10/22/2019.  During the surgery, there was more bleeding than normal, so she was kept to trend her hemoglobin and monitor her vitals signs. Her hemoglobin appeared to stabilize on Saturday but she was not feeling well. She stayed overnight and again her hemoglobin dropped on Sunday. Repeat showed a stabilization and INR was normal. She has continued to have good pain control, was eating, and was ambulating without issues prior to her discharge. She had no tachycardia or signs of bleeding. Her hemoglobin prior to discharge was 10.2.  Consults: None  Significant Diagnostic Studies: CT a/p with acute cholecystitis   Treatments: Laparoscopic cholecystectomy 11/21/2018  Discharge Exam: Blood pressure 128/70, pulse 75, temperature 98.6 F (37 C), temperature source Oral, resp. rate 16, height 5\' 5"  (1.651 m), weight 113.3 kg, SpO2 94 %. General appearance: alert, cooperative and no distress Resp: normal work breathing GI: soft, nondistended, appropriately tender, port site with dermabond, bruising at epigastric site  Disposition: Discharge disposition: 01-Home or Self Care       Discharge Instructions    Call MD for:  difficulty breathing, headache or visual disturbances   Complete by:  As directed    Call MD for:  persistant dizziness or light-headedness   Complete by:  As directed    Call MD for:  persistant nausea and vomiting   Complete by:  As directed    Call MD for:  redness, tenderness, or signs of infection (pain,  swelling, redness, odor or green/yellow discharge around incision site)   Complete by:  As directed    Call MD for:  severe uncontrolled pain   Complete by:  As directed    Call MD for:  temperature >100.4   Complete by:  As directed    Diet - low sodium heart healthy   Complete by:  As directed    Increase activity slowly   Complete by:  As directed      Allergies as of 11/24/2018      Reactions   Penicillins Shortness Of Breath, Swelling, Rash, Other (See Comments)   Did it involve swelling of the face/tongue/throat, SOB, or low BP? Yes Did it involve sudden or severe rash/hives, skin peeling, or any reaction on the inside of your mouth or nose? Yes Did you need to seek medical attention at a hospital or doctor's office? Yes-DR. office When did it last happen?Over 10 years If all above answers are "NO", may proceed with cephalosporin use. Patient tolerates Cephalosporins   Shellfish Allergy Shortness Of Breath, Swelling   Acyclovir And Related Other (See Comments)   UNSPECIFIED REACTION    Codeine Itching   Morphine And Related Nausea And Vomiting      Medication List    TAKE these medications   cetirizine 10 MG tablet Commonly known as:  ZYRTEC Take 10 mg by mouth daily as needed for allergies.   docusate sodium 100 MG capsule Commonly known as:  COLACE Take 1 capsule (100 mg total) by mouth 2 (two) times daily.   enalapril 20 MG tablet Commonly known as:  VASOTEC Take 1 tablet (20 mg total) by mouth 2 (two) times daily.   escitalopram 20 MG tablet Commonly known as:  LEXAPRO Take 1 tablet (20 mg total) by mouth daily.   fluticasone 50 MCG/ACT nasal spray Commonly known as:  FLONASE Place 2 sprays into both nostrils daily. What changed:    when to take this  reasons to take this   GNP CALCIUM PLUS 600 +D PO Take 1 tablet by mouth daily.   ondansetron 4 MG disintegrating tablet Commonly known as:  ZOFRAN-ODT Take 1 tablet (4 mg total) by mouth every 6  (six) hours as needed for nausea.   ONE-A-DAY WOMENS 50 PLUS PO Take 1 tablet by mouth daily.   oxyCODONE 5 MG immediate release tablet Commonly known as:  Oxy IR/ROXICODONE Take 1 tablet (5 mg total) by mouth every 4 (four) hours as needed for severe pain or breakthrough pain.   PROAIR HFA 108 (90 Base) MCG/ACT inhaler Generic drug:  albuterol INHALE TWO PUFFS BY MOUTH EVERY 4 TO 6 HOURS AS NEEDED FOR FOR WHEEZING What changed:    how much to take  how to take this  when to take this  reasons to take this  additional instructions      Follow-up Information    Virl Cagey, MD Follow up in 2 week(s).   Specialty:  General Surgery Contact information: [address] [place] [address] [phone]           Signed: Virl Cagey 11/24/2018, 12:10 PM

## 2018-11-24 NOTE — Discharge Instructions (Signed)
Discharge Laparoscopic Surgery Instructions: ° °Common Complaints: °Right shoulder pain is common after laparoscopic surgery. This is secondary to the gas used in the surgery being trapped under the diaphragm.  °Walk to help your body absorb the gas. This will improve in a few days. °Pain at the port sites are common, especially the larger port sites. This will improve with time.  °Some nausea is common and poor appetite. The main goal is to stay hydrated the first few days after surgery.  ° °Diet/ Activity: °Diet as tolerated. You may not have an appetite, but it is important to stay hydrated. Drink 64 ounces of water a day. Your appetite will return with time.  °Shower per your regular routine daily.  Do not take hot showers. Take warm showers that are less than 10 minutes. °Rest and listen to your body, but do not remain in bed all day.  °Walk everyday for at least 15-20 minutes. Deep cough and move around every 1-2 hours in the first few days after surgery.  °Do not lift > 10 lbs, perform excessive bending, pushing, pulling, squatting for 1-2 weeks after surgery.  °Do not pick at the dermabond glue on your incision sites.  This glue film will remain in place for 1-2 weeks and will start to peel off.  °Do not place lotions or balms on your incision unless instructed to specifically by Dr. Miraya Cudney.  ° °Medication: °Take tylenol and ibuprofen as needed for pain control, alternating every 4-6 hours.  °Example:  °Tylenol 1000mg @ 6am, 12noon, 6pm, 12midnight (Do not exceed 4000mg of tylenol a day). Ibuprofen 800mg @ 9am, 3pm, 9pm, 3am (Do not exceed 3600mg of ibuprofen a day).  °Take Roxicodone for breakthrough pain every 4 hours.  °Take Colace for constipation related to narcotic pain medication. If you do not have a bowel movement in 2 days, take Miralax over the counter.  °Drink plenty of water to also prevent constipation.  ° °Contact Information: °If you have questions or concerns, please call our office,  [phone], Monday- Thursday 8AM-5PM and Friday 8AM-12Noon.  °If it is after hours or on the weekend, please call [facility] Main Number, [phone], and ask to speak to the surgeon on call for Dr. Latisa Belay at [place].  ° °Laparoscopic Cholecystectomy, Care After °This sheet gives you information about how to care for yourself after your procedure. Your doctor may also give you more specific instructions. If you have problems or questions, contact your doctor. °Follow these instructions at home: °Care for cuts from surgery (incisions) ° °· Follow instructions from your doctor about how to take care of your cuts from surgery. Make sure you: °? Wash your hands with soap and water before you change your bandage (dressing). If you cannot use soap and water, use hand sanitizer. °? Change your bandage as told by your doctor. °? Leave stitches (sutures), skin glue, or skin tape (adhesive) strips in place. They may need to stay in place for 2 weeks or longer. If tape strips get loose and curl up, you may trim the loose edges. Do not remove tape strips completely unless your doctor says it is okay. °· Do not take baths, swim, or use a hot tub until your doctor says it is okay.  °· You may shower. °· Check your surgical cut area every day for signs of infection. Check for: °? More redness, swelling, or pain. °? More fluid or blood. °? Warmth. °? Pus or a bad smell. °Activity °· Do not   drive or use heavy machinery while taking prescription pain medicine. °· Do not lift anything that is heavier than 10 lb (4.5 kg) until your doctor says it is okay. °· Do not play contact sports until your doctor says it is okay. °· Do not drive for 24 hours if you were given a medicine to help you relax (sedative). °· Rest as needed. Do not return to work or school until your doctor says it is okay. °General instructions °· Take over-the-counter and prescription medicines only as told by your doctor. °· To prevent or treat constipation while  you are taking prescription pain medicine, your doctor may recommend that you: °? Drink enough fluid to keep your pee (urine) clear or pale yellow. °? Take over-the-counter or prescription medicines. °? Eat foods that are high in fiber, such as fresh fruits and vegetables, whole grains, and beans. °? Limit foods that are high in fat and processed sugars, such as fried and sweet foods. °Contact a doctor if: °· You develop a rash. °· You have more redness, swelling, or pain around your surgical cuts. °· You have more fluid or blood coming from your surgical cuts. °· Your surgical cuts feel warm to the touch. °· You have pus or a bad smell coming from your surgical cuts. °· You have a fever. °· One or more of your surgical cuts breaks open. °Get help right away if: °· You have trouble breathing. °· You have chest pain. °· You have pain that is getting worse in your shoulders. °· You faint or feel dizzy when you stand. °· You have very bad pain in your belly (abdomen). °· You are sick to your stomach (nauseous) for more than one day. °· You have throwing up (vomiting) that lasts for more than one day. °· You have leg pain. °This information is not intended to replace advice given to you by your health care provider. Make sure you discuss any questions you have with your health care provider. °Document Released: 07/24/2008 Document Revised: 05/05/2016 Document Reviewed: 04/02/2016 °Elsevier Interactive Patient Education © 2019 [employer] Inc. ° ° °

## 2018-11-24 NOTE — Telephone Encounter (Signed)
11/24/18  she had her galbladder removed last Thursday and she said that she would call back about the Friday appt.

## 2018-11-25 ENCOUNTER — Other Ambulatory Visit: Payer: Self-pay | Admitting: *Deleted

## 2018-11-25 ENCOUNTER — Ambulatory Visit (HOSPITAL_COMMUNITY): Payer: 59

## 2018-11-25 LAB — TYPE AND SCREEN
ABO/RH(D): O POS
Antibody Screen: NEGATIVE
Unit division: 0
Unit division: 0

## 2018-11-25 LAB — BPAM RBC
Blood Product Expiration Date: 202002262359
Blood Product Expiration Date: 202002262359
Unit Type and Rh: 5100
Unit Type and Rh: 5100

## 2018-11-25 NOTE — Patient Outreach (Signed)
[facility]) Care Management  11/25/2018  Jennifer Coleman January 31, 1951 [id number]   Transition of care telephone call   Subjective: Initial unsuccessful telephone call to patient's preferred number (mobile) in order to complete transition of care assessment; no answer, left HIPAA compliant voicemail message requesting return call.   Objective: Per the electronic medical record, [name]  was hospitalized at [facility]  From 1/23-1/27/20 for acute cholecystitis. She had a laparoscopic cholecystectomy on 11/21/18. Comorbidities include: HTN, varicose veins, arthritis, depression /anxiety, and obesity  Per the discharge summary, she was discharged to home on 1/27  without the need for home health services or durable medical equipment.   Assessment:   Plan: If no return call from patient, this RNCM will attempt another outreach within 4 business days.  Will route unsuccessful outreach letter with [place] Management information pamphlet and 24 hour Nurse Advice Line magnet to [place] Management clinical pool to be mailed to patient's home address.

## 2018-11-27 ENCOUNTER — Ambulatory Visit (HOSPITAL_COMMUNITY)
Admission: RE | Admit: 2018-11-27 | Discharge: 2018-11-27 | Disposition: A | Discharge status: Home / Self Care | Payer: 59 | Source: Ambulatory Visit | Attending: General Surgery | Admitting: General Surgery

## 2018-11-27 ENCOUNTER — Ambulatory Visit: Payer: Self-pay | Admitting: *Deleted

## 2018-11-27 ENCOUNTER — Ambulatory Visit (INDEPENDENT_AMBULATORY_CARE_PROVIDER_SITE_OTHER): Payer: 59 | Admitting: Psychiatry

## 2018-11-27 ENCOUNTER — Other Ambulatory Visit: Payer: Self-pay | Admitting: *Deleted

## 2018-11-27 DIAGNOSIS — F509 Eating disorder, unspecified: Secondary | ICD-10-CM

## 2018-11-27 DIAGNOSIS — Z01818 Encounter for other preprocedural examination: Secondary | ICD-10-CM | POA: Diagnosis not present

## 2018-11-27 NOTE — Patient Outreach (Addendum)
[facility]) Care Management  11/27/2018  Jennifer Coleman May 10, 1951 [id number]   Transition of care call and Emmi Red follow up  Referral received:11/24/18 Initial outreach: 11/25/18 Insurance: [place]   Subjective: Successful second telephone call to patient's preferred number in order to complete transition of care assessment and discuss Emmi Red questions.; 2 HIPAA identifiers verified. Explained purpose of call and completed transition of care assessment.  Jennifer Coleman states she is currently at [facility] having an outpatient chest Xray in preparation for bariatric surgery. She says she has not been able to scheulde the surgery yet because she has to complete all steps of the preoperative protocol first.  Regarding the answers to the Emmi questions that were red alerts, she says she has not read her discharge instructions since she was discharged but they were reviewed with her prior to discharge from the hospital. She also says she has not made the follow up appointment with her surgeon but she did leave message with them today.     Objective:  Jennifer Coleman  was hospitalized at [facility]  From 1/23-1/27/20 for acute cholecystitis. She had a laparoscopic cholecystectomy on 11/21/18. Comorbidities include: HTN, varicose veins, arthritis, depression /anxiety, and obesity  Per the discharge summary, she was discharged to home on 1/27 without the need for home health services or durable medical equipment.     Assessment:  See transition of care flowsheet for assessment details.   Plan: Since she had not read her discharge instructions since she left the hospital, all instructions were reviewed with her. Jennifer Coleman she will , pending notification to this RNCM, receive a preoperative phone call as part of the transition of care protocol when her bariatric surgery is scheduled.   No ongoing care management needs identified so will close case to  [place] Management care management services and route successful outreach letter with [place] Management pamphlet and 24 Hour Nurse Line Magnet to [place] Management clinical pool to be mailed to patient's home address.   [name] RN,CCM,CDE [place] [facility] Management Coordinator Office Phone [phone] Office Fax [phone]

## 2018-11-28 ENCOUNTER — Ambulatory Visit (HOSPITAL_COMMUNITY): Payer: 59 | Admitting: Occupational Therapy

## 2018-11-28 ENCOUNTER — Ambulatory Visit: Payer: Self-pay | Admitting: *Deleted

## 2018-11-28 ENCOUNTER — Encounter (HOSPITAL_COMMUNITY): Payer: Self-pay | Admitting: Occupational Therapy

## 2018-11-28 DIAGNOSIS — M25512 Pain in left shoulder: Secondary | ICD-10-CM

## 2018-11-28 DIAGNOSIS — M25612 Stiffness of left shoulder, not elsewhere classified: Secondary | ICD-10-CM | POA: Diagnosis not present

## 2018-11-28 DIAGNOSIS — R29898 Other symptoms and signs involving the musculoskeletal system: Secondary | ICD-10-CM | POA: Diagnosis not present

## 2018-11-28 NOTE — Patient Instructions (Signed)

## 2018-11-28 NOTE — Therapy (Signed)
[place], [place], [address] Phone: [phone]   Fax:  [phone]  Occupational Therapy Treatment  Patient Details  Name: Jennifer Coleman MRN: [id number] Date of Birth: 1951/07/28 Referring Provider (OT): Dr. Joni Fears   Encounter Date: 11/28/2018  OT End of Session - 11/28/18 1030    Visit Number  9    Number of Visits  16    Date for OT Re-Evaluation  12/14/18    Authorization Type  [facility]    Authorization Time Period  25 visit limit    Authorization - Visit Number  4    Authorization - Number of Visits  25    OT Start Time  [phone]   pt arrived late   OT Stop Time  1030    OT Time Calculation (min)  34 min    Activity Tolerance  Patient tolerated treatment well    Behavior During Therapy  [facility] for tasks assessed/performed       Past Medical History:  Diagnosis Date  . Anxiety   . Arthritis   . Dyspnea   . Dysrhythmia   . Family history of anesthesia complication 25 yrs ago   Sister allergic to sccinocholine - unable to come off ventilator  . Headache    started last night , pain level  8  . Hypertension   . Pneumonia   . PONV (postoperative nausea and vomiting)   . Premature ventricular contractions   . Shingles   . Varicose veins   . Varicose veins of left lower extremity     Past Surgical History:  Procedure Laterality Date  . CARPAL TUNNEL RELEASE    . CERVICAL DISC SURGERY  1992   Fusion  . CHOLECYSTECTOMY N/A 11/21/2018   Procedure: LAPAROSCOPIC CHOLECYSTECTOMY;  Surgeon: Virl Cagey, MD;  Location: [facility];  Service: General;  Laterality: N/A;  . COLONOSCOPY N/A 06/07/2016   Procedure: COLONOSCOPY;  Surgeon: Rogene Houston, MD;  Location: [facility];  Service: Endoscopy;  Laterality: N/A;  1:00  . KNEE ARTHROSCOPY  09/16/2012   Procedure: ARTHROSCOPY KNEE;  Surgeon: [name], MD;  Location: [facility];  Service: Orthopedics;  Laterality: Right;  Right Knee Arthroscopy  .  SALPINGOOPHORECTOMY Left 1985  . SHOULDER ARTHROSCOPY WITH OPEN ROTATOR CUFF REPAIR AND DISTAL CLAVICLE ACROMINECTOMY Right 05/03/2016   Procedure: RIGHT SHOULDER ARTHROSCOPY WITH MINI-OPEN ROTATOR CUFF REPAIR,DISTAL CLAVICLE RESECTION, SUBACROMIAL DECOMPRESSION.;  Surgeon: [name], MD;  Location: [facility];  Service: Orthopedics;  Laterality: Right;  . SHOULDER ARTHROSCOPY WITH ROTATOR CUFF REPAIR Right 09/25/2016   Procedure: Right Shoulder Manipulation, Arthroscopic Debridement of Joint, Removal of Loose Body and Re-Repair of Rotator Cuff;  Surgeon: [name], MD;  Location: [facility];  Service: Orthopedics;  Laterality: Right;    There were no vitals filed for this visit.  Subjective Assessment - 11/28/18 0956    Subjective   S: It's still just really tight.     Currently in Pain?  Yes    Pain Score  2     Pain Location  Shoulder    Pain Orientation  Left    Pain Descriptors / Indicators  Sore    Pain Type  Acute pain    Pain Radiating Towards  none    Pain Onset  More than a month ago    Pain Frequency  Intermittent    Aggravating Factors   movement    Pain Relieving  Factors  rest, stretching    Effect of Pain on Daily Activities  mod effect on ADLs    Multiple Pain Sites  No         [facility] OT Assessment - 11/28/18 0955      Assessment   Medical Diagnosis  S/P Left RCR, SAD/DCE      Precautions   Precautions  Shoulder    Type of Shoulder Precautions  Follow protocol from Dr. Durward Fortes    Shoulder Interventions  Shoulder sling/immobilizer               OT Treatments/Exercises (OP) - 11/28/18 0959      Exercises   Exercises  Shoulder      Shoulder Exercises: Supine   Protraction  PROM;5 reps;AROM;15 reps    Horizontal ABduction  PROM;5 reps;AROM;15 reps    External Rotation  PROM;5 reps;AROM;15 reps    Internal Rotation  PROM;5 reps;AROM;15 reps    Flexion  PROM;5 reps;AROM;15 reps    ABduction  PROM;5 reps;AROM;15 reps       Shoulder Exercises: Seated   Protraction  AROM;10 reps    Horizontal ABduction  AROM;10 reps    External Rotation  AROM;10 reps    Internal Rotation  AROM;10 reps    Flexion  AROM;10 reps    Abduction  AROM;10 reps      Shoulder Exercises: ROM/Strengthening   X to V Arms  10X    Proximal Shoulder Strengthening, Supine  10X each no rest breaks    Proximal Shoulder Strengthening, Seated  10X each 1 rest break      Manual Therapy   Manual Therapy  Myofascial release    Manual therapy comments  manual therapy completed seperately from all other interventions    Myofascial Release  myofascial release and manual stretching to left upper arm, scapular and shoulder region to decrease pain and restrictions and improve pain free mobility.               OT Education - 11/28/18 1010    Education Details  shoulder A/ROM    Person(s) Educated  Patient    Methods  Explanation;Demonstration;Handout    Comprehension  Verbalized understanding;Returned demonstration       OT Short Term Goals - 10/20/18 1342      OT SHORT TERM GOAL #1   Title  Patient will be educated on a HEP for left shoulder ROM and strength.    Time  4    Period  Weeks    Status  On-going      OT SHORT TERM GOAL #2   Title  Patient will improve left shoulder  P/ROM to [facility] in order to complete B/ADLs with increased independence.    Time  4    Period  Weeks    Status  On-going      OT SHORT TERM GOAL #3   Title  Patient will improve left shoulder and strength to 3+/5 for increased ability to pick up baskets of laundry.    Time  4    Period  Weeks    Status  On-going      OT SHORT TERM GOAL #4   Title  Patient will decrease left shoulder pain to 4/10 or better in order to complete B/ADLs with greater independence.    Time  4    Period  Weeks    Status  On-going      OT SHORT TERM GOAL #5   Title  Patient will  decrease fascial restrictions to moderate amount in left shoulder region for greater mobility needed  for daily tasks.     Time  4    Period  Weeks    Status  On-going        OT Long Term Goals - 10/20/18 1342      OT LONG TERM GOAL #1   Title  Patient will return to prior level of independence with all B/IADLs, work, and leisure activities using her left arm normally.     Time  8    Period  Weeks    Status  On-going      OT LONG TERM GOAL #2   Title  Patient will improve left shoulder A/ROM to WNL for greater ability to use left arm actively work and home.     Time  8    Period  Weeks    Status  On-going      OT LONG TERM GOAL #3   Title  Patient will improve left shoulder and elbow strength to 5/5 for greater ability to lift bags of mulch when completing yardwork.      Time  8    Period  Weeks    Status  On-going      OT LONG TERM GOAL #4   Title  Patient will decrease left shoulder pain to 2/10 or better when completing functional actiivites.     Time  8    Period  Weeks    Status  On-going      OT LONG TERM GOAL #5   Title  Paitent will decrease left fascial restrictions in left shoulder to trace amount in order to have greater mobility needed for use of left arm with functional activiites.      Time  8    Period  Weeks    Status  On-going            Plan - 11/28/18 1008    Clinical Impression Statement  A: Pt reports increased tightness at deltoid region, OT completed manual therapy however no major restrictions palpated. Pt reports area of pain at incision when pressed, no abnormalities palpated. Continued with A/ROM this session, pt completing without difficulty in supine and sitting. Added proximal shoulder strengthening and and x to v arms. Verbal cuing for form and technique. Increased time required today due to pt being sore from gallbladder surgery.     Plan  P: progress note. Increase all A/ROM repetitions to 15, add overhead lacing and UBE       Patient will benefit from skilled therapeutic intervention in order to improve the following deficits and  impairments:  Decreased range of motion, Impaired flexibility, Increased muscle spasms, Impaired UE functional use, Pain, Decreased strength  Visit Diagnosis: Stiffness of left shoulder, not elsewhere classified  Acute pain of left shoulder  Other symptoms and signs involving the musculoskeletal system    Problem List Patient Active Problem List   Diagnosis Date Noted  . Postoperative anemia due to acute blood loss 11/24/2018  . Acute cholecystitis 11/20/2018  . Impingement syndrome of left shoulder 09/30/2018  . Unspecified rotator cuff tear or rupture of right shoulder, not specified as traumatic 09/30/2018  . Acromioclavicular joint arthritis 09/30/2018  . Sun-damaged skin 01/03/2018  . Morbid obesity ([place]) 11/05/2017  . Depression with anxiety 11/05/2017  . Nontraumatic incomplete tear of right rotator cuff 09/25/2016  . Foreign body of shoulder right  09/25/2016  . 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  Shoulder joint painful on movement, right 08/29/2016  . Epigastric pain 08/11/2015  . Chest pain, rule out acute myocardial infarction 08/11/2015  . Renal insufficiency 08/11/2015  . Venous stasis 04/13/2015  . Depression 04/13/2015  . Essential hypertension 09/12/2014  . PVCs (premature ventricular contractions) 09/07/2014  . Precordial pain 09/07/2014   Guadelupe Sabin, OTR/L  [phone] 11/28/2018, 10:31 AM  [place] [address] [place], [place], [address] Phone: [phone]   Fax:  [phone]  Name: KANSAS SPAINHOWER MRN: [id number] Date of Birth: 22-Aug-1951

## 2018-12-01 ENCOUNTER — Ambulatory Visit (HOSPITAL_COMMUNITY): Payer: 59 | Attending: Orthopaedic Surgery

## 2018-12-01 ENCOUNTER — Encounter (HOSPITAL_COMMUNITY): Payer: Self-pay

## 2018-12-01 ENCOUNTER — Telehealth: Payer: Self-pay | Admitting: Emergency Medicine

## 2018-12-01 DIAGNOSIS — M25612 Stiffness of left shoulder, not elsewhere classified: Secondary | ICD-10-CM | POA: Diagnosis not present

## 2018-12-01 DIAGNOSIS — M25512 Pain in left shoulder: Secondary | ICD-10-CM | POA: Diagnosis not present

## 2018-12-01 DIAGNOSIS — R29898 Other symptoms and signs involving the musculoskeletal system: Secondary | ICD-10-CM | POA: Insufficient documentation

## 2018-12-01 NOTE — Telephone Encounter (Signed)
Patient called and stated she wanted labs drawn because she is feeling tires. After speaking with Doctor Constance Haw she stated the patient is anemic and to take 200mg  once a day of otc iron and 500mg  once daily of vit c. I notified the patient of this and she stated she would buy otc vit c and iron and that she understood and she would see [place] for her follow up appointment on 2/6

## 2018-12-01 NOTE — Therapy (Signed)
[facility] [address] [place], [place], [address] Phone: [phone]   Fax:  [phone]  Occupational Therapy Treatment Reassessment/re-cert Patient Details  Name: Jennifer Coleman MRN: [id number] Date of Birth: 1951/10/28 Referring Provider (OT): Dr. Joni Fears   Progress Note Reporting Period 10/16/2019 to 12/01/2018  See note below for Objective Data and Assessment of Progress/Goals.       Encounter Date: 12/01/2018  OT End of Session - 12/01/18 1109    Visit Number  10    Number of Visits  16    Date for OT Re-Evaluation  12/29/18    Authorization Type  UMR    Authorization Time Period  25 visit limit    Authorization - Visit Number  5    Authorization - Number of Visits  25    OT Start Time  5053   reassessment   OT Stop Time  1115    OT Time Calculation (min)  42 min    Activity Tolerance  Patient tolerated treatment well    Behavior During Therapy  WFL for tasks assessed/performed       Past Medical History:  Diagnosis Date  . Anxiety   . Arthritis   . Dyspnea   . Dysrhythmia   . Family history of anesthesia complication 25 yrs ago   Sister allergic to sccinocholine - unable to come off ventilator  . Headache    started last night , pain level  8  . Hypertension   . Pneumonia   . PONV (postoperative nausea and vomiting)   . Premature ventricular contractions   . Shingles   . Varicose veins   . Varicose veins of left lower extremity     Past Surgical History:  Procedure Laterality Date  . CARPAL TUNNEL RELEASE    . CERVICAL DISC SURGERY  1992   Fusion  . CHOLECYSTECTOMY N/A 11/21/2018   Procedure: LAPAROSCOPIC CHOLECYSTECTOMY;  Surgeon: Virl Cagey, MD;  [facility];  Service: General;  Laterality: N/A;  . COLONOSCOPY N/A 06/07/2016   Procedure: COLONOSCOPY;  Surgeon: Rogene Houston, MD;  [facility];  Service: Endoscopy;  Laterality: N/A;  1:00  . KNEE ARTHROSCOPY  09/16/2012    Procedure: ARTHROSCOPY KNEE;  Surgeon: [name], MD;  Location: [facility];  Service: Orthopedics;  Laterality: Right;  Right Knee Arthroscopy  . SALPINGOOPHORECTOMY Left 1985  . SHOULDER ARTHROSCOPY WITH OPEN ROTATOR CUFF REPAIR AND DISTAL CLAVICLE ACROMINECTOMY Right 05/03/2016   Procedure: RIGHT SHOULDER ARTHROSCOPY WITH MINI-OPEN ROTATOR CUFF REPAIR,DISTAL CLAVICLE RESECTION, SUBACROMIAL DECOMPRESSION.;  Surgeon: [name], MD;  Location: [facility];  Service: Orthopedics;  Laterality: Right;  . SHOULDER ARTHROSCOPY WITH ROTATOR CUFF REPAIR Right 09/25/2016   Procedure: Right Shoulder Manipulation, Arthroscopic Debridement of Joint, Removal of Loose Body and Re-Repair of Rotator Cuff;  Surgeon: [name], MD;  Location: [facility];  Service: Orthopedics;  Laterality: Right;    There were no vitals filed for this visit.  Subjective Assessment - 12/01/18 1049    Subjective   S: Some exercises pull on my side where my surgery was so I didn't do all of them at home.     Currently in Pain?  Yes    Pain Score  5     Pain Location  Shoulder    Pain Orientation  Left    Pain Type  Acute pain         [facility] OT Assessment -  12/01/18 1051      Assessment   Medical Diagnosis  S/P Left RCR, SAD/DCE    Referring Provider (OT)  Dr. Joni Fears    Onset Date/Surgical Date  09/30/18      Precautions   Precautions  Shoulder    Type of Shoulder Precautions  Follow protocol from Dr. Durward Fortes    Shoulder Interventions  Shoulder sling/immobilizer      Prior Function   Level of Independence  Independent      AROM   Overall AROM Comments  Assessed seated, er/IR adducted    AROM Assessment Site  Shoulder    Right/Left Shoulder  Left    Left Shoulder Flexion  130 Degrees   previous; 61   Left Shoulder ABduction  130 Degrees   previous: 40   Left Shoulder Internal Rotation  90 Degrees   previous: 90   Left Shoulder External Rotation  57 Degrees   previous:  36     PROM   Overall PROM   Within functional limits for tasks performed    Overall PROM Comments  left shoulder      Strength   Overall Strength Comments  Assessed seated. IR/er adducted. Strength assessed for the first time this session.     Strength Assessment Site  Shoulder    Right/Left Shoulder  Left    Left Shoulder Flexion  3/5    Left Shoulder ABduction  3/5    Left Shoulder Internal Rotation  4-/5    Left Shoulder External Rotation  4-/5               OT Treatments/Exercises (OP) - 12/01/18 1101      Exercises   Exercises  Shoulder      Shoulder Exercises: Supine   Protraction  PROM;5 reps;AROM;15 reps    Horizontal ABduction  PROM;5 reps;AROM;15 reps    External Rotation  PROM;5 reps;AROM;15 reps    Internal Rotation  PROM;5 reps;AROM;15 reps    Flexion  PROM;5 reps;AROM;15 reps    ABduction  PROM;5 reps;AROM;15 reps      Shoulder Exercises: ROM/Strengthening   UBE (Upper Arm Bike)  level 1 2' forward 2' reverse   pace: 4.5-5.0   Over Head Lace  2' seated at [employer]      Manual Therapy   Manual Therapy  Myofascial release    Manual therapy comments  manual therapy completed seperately from all other interventions    Myofascial Release  myofascial release and manual stretching to left upper arm, scapular and shoulder region to decrease pain and restrictions and improve pain free mobility.                 OT Short Term Goals - 12/01/18 1056      OT SHORT TERM GOAL #1   Title  Patient will be educated on a HEP for left shoulder ROM and strength.    Time  4    Period  Weeks    Status  Achieved      OT SHORT TERM GOAL #2   Title  Patient will improve left shoulder  P/ROM to [facility] in order to complete B/ADLs with increased independence.    Time  4    Period  Weeks    Status  Achieved      OT SHORT TERM GOAL #3   Title  Patient will improve left shoulder and strength to 3+/5 for increased ability to pick up baskets of laundry.    Time  4     Period  Weeks    Status  Partially Met      OT SHORT TERM GOAL #4   Title  Patient will decrease left shoulder pain to 4/10 or better in order to complete B/ADLs with greater independence.    Time  4    Period  Weeks    Status  On-going      OT SHORT TERM GOAL #5   Title  Patient will decrease fascial restrictions to moderate amount in left shoulder region for greater mobility needed for daily tasks.     Time  4    Period  Weeks    Status  Achieved        OT Long Term Goals - 12/01/18 1059      OT LONG TERM GOAL #1   Title  Patient will return to prior level of independence with all B/IADLs, work, and leisure activities using her left arm normally.     Time  8    Period  Weeks    Status  On-going      OT LONG TERM GOAL #2   Title  Patient will improve left shoulder A/ROM to WNL for greater ability to use left arm actively work and home.     Time  8    Period  Weeks    Status  On-going      OT LONG TERM GOAL #3   Title  Patient will improve left shoulder and elbow strength to 5/5 for greater ability to lift bags of mulch when completing yardwork.      Time  8    Period  Weeks    Status  On-going      OT LONG TERM GOAL #4   Title  Patient will decrease left shoulder pain to 2/10 or better when completing functional actiivites.     Time  8    Period  Weeks    Status  On-going      OT LONG TERM GOAL #5   Title  Paitent will decrease left fascial restrictions in left shoulder to trace amount in order to have greater mobility needed for use of left arm with functional activiites.      Time  8    Period  Weeks    Status  Achieved            Plan - 12/01/18 1044    Clinical Impression Statement  A: 10th day progress note completed this date. patient has shown improvement with achieving functional active and passive ROM, strength is beginning to increase, and fascial restrictions have decreased to trace amount. patient reports pain/soreness near incision at [facility]  joint frequently and completes self massage to manage. Patient is beginning to use her left arm for frequently for daily tasks although is limited by decreased strength at times. Pt has met 3 short term goals and 1 long term goal at this time. Recommend that patient continue with OT 2x/ week for 4 more weeks.     Plan  P: Continue OT services focusing on strength, A/ROM, and scapular stability and endurance. Complete FOTO. Complete standing A/ROM for 15 repetitions next session. Complete myofascial release if needed although none noted during last session.     Consulted and Agree with Plan of Care  Patient       Patient will benefit from skilled therapeutic intervention in order to improve the following deficits and impairments:  Decreased range of motion, Impaired flexibility, Increased  muscle spasms, Impaired UE functional use, Pain, Decreased strength  Visit Diagnosis: Stiffness of left shoulder, not elsewhere classified - Plan: Ot plan of care cert/re-cert  Acute pain of left shoulder - Plan: Ot plan of care cert/re-cert  Other symptoms and signs involving the musculoskeletal system - Plan: Ot plan of care cert/re-cert    Problem List Patient Active Problem List   Diagnosis Date Noted  . Postoperative anemia due to acute blood loss 11/24/2018  . Acute cholecystitis 11/20/2018  . Impingement syndrome of left shoulder 09/30/2018  . Unspecified rotator cuff tear or rupture of right shoulder, not specified as traumatic 09/30/2018  . Acromioclavicular joint arthritis 09/30/2018  . Sun-damaged skin 01/03/2018  . Morbid obesity ([place]) 11/05/2017  . Depression with anxiety 11/05/2017  . Nontraumatic incomplete tear of right rotator cuff 09/25/2016  . Foreign body of shoulder right  09/25/2016  . 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 Shoulder joint painful on movement, right 08/29/2016  . Epigastric pain 08/11/2015  . Chest pain, rule out acute myocardial infarction  08/11/2015  . Renal insufficiency 08/11/2015  . Venous stasis 04/13/2015  . Depression 04/13/2015  . Essential hypertension 09/12/2014  . PVCs (premature ventricular contractions) 09/07/2014  . Precordial pain 09/07/2014    Ailene Ravel, OTR/L,CBIS  [phone]  12/01/2018, 11:16 AM  [facility] [address] [place], [place], [address] Phone: [phone]   Fax:  [phone]  Name: AREIL OTTEY MRN: [id number] Date of Birth: [birth year]

## 2018-12-02 ENCOUNTER — Other Ambulatory Visit (HOSPITAL_COMMUNITY): Payer: Self-pay | Admitting: General Surgery

## 2018-12-02 ENCOUNTER — Encounter (HOSPITAL_COMMUNITY): Payer: Self-pay | Admitting: Occupational Therapy

## 2018-12-02 ENCOUNTER — Ambulatory Visit (HOSPITAL_COMMUNITY): Payer: 59 | Admitting: Occupational Therapy

## 2018-12-02 ENCOUNTER — Other Ambulatory Visit: Payer: Self-pay | Admitting: General Surgery

## 2018-12-02 DIAGNOSIS — M25612 Stiffness of left shoulder, not elsewhere classified: Secondary | ICD-10-CM

## 2018-12-02 DIAGNOSIS — M25512 Pain in left shoulder: Secondary | ICD-10-CM | POA: Diagnosis not present

## 2018-12-02 DIAGNOSIS — R29898 Other symptoms and signs involving the musculoskeletal system: Secondary | ICD-10-CM | POA: Diagnosis not present

## 2018-12-02 NOTE — Therapy (Signed)
[facility] [place], [place], [address] Phone: [phone]   Fax:  [phone]  Occupational Therapy Treatment  Patient Details  Name: Jennifer Coleman MRN: [id number] Date of Birth: Mar 05, 1951 Referring Provider (OT): Dr. Joni Fears   Encounter Date: 12/02/2018  OT End of Session - 12/02/18 1059    Visit Number  11    Number of Visits  16    Date for OT Re-Evaluation  12/29/18    Authorization Type  UMR    Authorization Time Period  25 visit limit    Authorization - Visit Number  6    Authorization - Number of Visits  25    OT Start Time  [phone]   pt arrived late   OT Stop Time  1030    OT Time Calculation (min)  36 min    Activity Tolerance  Patient tolerated treatment well    Behavior During Therapy  [facility] for tasks assessed/performed       Past Medical History:  Diagnosis Date  . Anxiety   . Arthritis   . Dyspnea   . Dysrhythmia   . Family history of anesthesia complication 25 yrs ago   Sister allergic to sccinocholine - unable to come off ventilator  . Headache    started last night , pain level  8  . Hypertension   . Pneumonia   . PONV (postoperative nausea and vomiting)   . Premature ventricular contractions   . Shingles   . Varicose veins   . Varicose veins of left lower extremity     Past Surgical History:  Procedure Laterality Date  . CARPAL TUNNEL RELEASE    . CERVICAL DISC SURGERY  1992   Fusion  . CHOLECYSTECTOMY N/A 11/21/2018   Procedure: LAPAROSCOPIC CHOLECYSTECTOMY;  Surgeon: Virl Cagey, MD;  [facility];  Service: General;  Laterality: N/A;  . COLONOSCOPY N/A 06/07/2016   Procedure: COLONOSCOPY;  Surgeon: Rogene Houston, MD;  [facility];  Service: Endoscopy;  Laterality: N/A;  1:00  . KNEE ARTHROSCOPY  09/16/2012   Procedure: ARTHROSCOPY KNEE;  Surgeon: [name], MD;  Location: [facility];  Service: Orthopedics;  Laterality: Right;  Right Knee Arthroscopy  .  SALPINGOOPHORECTOMY Left 1985  . SHOULDER ARTHROSCOPY WITH OPEN ROTATOR CUFF REPAIR AND DISTAL CLAVICLE ACROMINECTOMY Right 05/03/2016   Procedure: RIGHT SHOULDER ARTHROSCOPY WITH MINI-OPEN ROTATOR CUFF REPAIR,DISTAL CLAVICLE RESECTION, SUBACROMIAL DECOMPRESSION.;  Surgeon: [name], MD;  Location: [facility];  Service: Orthopedics;  Laterality: Right;  . SHOULDER ARTHROSCOPY WITH ROTATOR CUFF REPAIR Right 09/25/2016   Procedure: Right Shoulder Manipulation, Arthroscopic Debridement of Joint, Removal of Loose Body and Re-Repair of Rotator Cuff;  Surgeon: [name], MD;  Location: [facility];  Service: Orthopedics;  Laterality: Right;    There were no vitals filed for this visit.  Subjective Assessment - 12/02/18 0955    Subjective   S: I've been doing 15 each of my home exercises for the most part.     Currently in Pain?  Yes    Pain Score  4     Pain Location  Shoulder    Pain Orientation  Left    Pain Descriptors / Indicators  Sore    Pain Type  Acute pain    Pain Radiating Towards  none    Pain Onset  More than a month ago    Pain Frequency  Intermittent    Aggravating Factors  movement    Pain Relieving Factors  rest, stretching    Effect of Pain on Daily Activities  min/mod effect on ADLs    Multiple Pain Sites  No         [facility] OT Assessment - 12/02/18 0954      Assessment   Medical Diagnosis  S/P Left RCR, SAD/DCE      Precautions   Precautions  Shoulder    Type of Shoulder Precautions  Follow protocol from Dr. Durward Fortes               OT Treatments/Exercises (OP) - 12/02/18 0956      Exercises   Exercises  Shoulder      Shoulder Exercises: Supine   Protraction  PROM;5 reps    Horizontal ABduction  PROM;5 reps    External Rotation  PROM;5 reps    Internal Rotation  PROM;5 reps    Flexion  PROM;5 reps    ABduction  PROM;5 reps      Shoulder Exercises: Seated   Protraction  AROM;15 reps    Horizontal ABduction  AROM;12 reps     External Rotation  AROM;15 reps    Internal Rotation  AROM;15 reps    Flexion  AROM;15 reps    Abduction  AROM;15 reps      Shoulder Exercises: Standing   Extension  Theraband;15 reps    Theraband Level (Shoulder Extension)  Level 2 (Red)    Row  Theraband;15 reps    Theraband Level (Shoulder Row)  Level 2 (Red)    Retraction  Theraband;15 reps    Theraband Level (Shoulder Retraction)  Level 2 (Red)      Shoulder Exercises: ROM/Strengthening   UBE (Upper Arm Bike)  level 1 2' forward 2' reverse   pace: 4.5-5.0   X to V Arms  15X    Proximal Shoulder Strengthening, Seated  15X each 1 rest break    Other ROM/Strengthening Exercises  Pt passed tennis ball behind back working on IR, 10X      Shoulder Exercises: Stretch   Internal Rotation Stretch  3 reps   10" towel behind back-horizontal    External Rotation Stretch  3 reps;10 seconds    Wall Stretch - Flexion  3 reps;10 seconds    Other Shoulder Stretches  doorway stretch, 3 reps, 10" each               OT Short Term Goals - 12/01/18 1056      OT SHORT TERM GOAL #1   Title  Patient will be educated on a HEP for left shoulder ROM and strength.    Time  4    Period  Weeks    Status  Achieved      OT SHORT TERM GOAL #2   Title  Patient will improve left shoulder  P/ROM to [facility] in order to complete B/ADLs with increased independence.    Time  4    Period  Weeks    Status  Achieved      OT SHORT TERM GOAL #3   Title  Patient will improve left shoulder and strength to 3+/5 for increased ability to pick up baskets of laundry.    Time  4    Period  Weeks    Status  Partially Met      OT SHORT TERM GOAL #4   Title  Patient will decrease left shoulder pain to 4/10 or better in order to complete B/ADLs with greater independence.  Time  4    Period  Weeks    Status  On-going      OT SHORT TERM GOAL #5   Title  Patient will decrease fascial restrictions to moderate amount in left shoulder region for greater  mobility needed for daily tasks.     Time  4    Period  Weeks    Status  Achieved        OT Long Term Goals - 12/01/18 1059      OT LONG TERM GOAL #1   Title  Patient will return to prior level of independence with all B/IADLs, work, and leisure activities using her left arm normally.     Time  8    Period  Weeks    Status  On-going      OT LONG TERM GOAL #2   Title  Patient will improve left shoulder A/ROM to WNL for greater ability to use left arm actively work and home.     Time  8    Period  Weeks    Status  On-going      OT LONG TERM GOAL #3   Title  Patient will improve left shoulder and elbow strength to 5/5 for greater ability to lift bags of mulch when completing yardwork.      Time  8    Period  Weeks    Status  On-going      OT LONG TERM GOAL #4   Title  Patient will decrease left shoulder pain to 2/10 or better when completing functional actiivites.     Time  8    Period  Weeks    Status  On-going      OT LONG TERM GOAL #5   Title  Paitent will decrease left fascial restrictions in left shoulder to trace amount in order to have greater mobility needed for use of left arm with functional activiites.      Time  8    Period  Weeks    Status  Achieved            Plan - 12/02/18 1007    Clinical Impression Statement  A: Minimal fascial restrictions palpated today, no manual therapy required. Pt completing A/ROM in sitting progressing repetitions to 15. Added shoulder stretches and continued with scapular theraband. Added ball pass behind back working on IR. Verbal cuing for form and technique.     Plan  P: Continue with shoulder stretches and provide for HEP. Continue working on IR behind back       Patient will benefit from skilled therapeutic intervention in order to improve the following deficits and impairments:  Decreased range of motion, Impaired flexibility, Increased muscle spasms, Impaired UE functional use, Pain, Decreased strength  Visit  Diagnosis: Stiffness of left shoulder, not elsewhere classified  Acute pain of left shoulder  Other symptoms and signs involving the musculoskeletal system    Problem List Patient Active Problem List   Diagnosis Date Noted  . Postoperative anemia due to acute blood loss 11/24/2018  . Acute cholecystitis 11/20/2018  . Impingement syndrome of left shoulder 09/30/2018  . Unspecified rotator cuff tear or rupture of right shoulder, not specified as traumatic 09/30/2018  . Acromioclavicular joint arthritis 09/30/2018  . Sun-damaged skin 01/03/2018  . Morbid obesity ([place]) 11/05/2017  . Depression with anxiety 11/05/2017  . Nontraumatic incomplete tear of right rotator cuff 09/25/2016  . Foreign body of shoulder right  09/25/2016  . 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 Shoulder joint painful on movement, right 08/29/2016  . Epigastric pain 08/11/2015  . Chest pain, rule out acute myocardial infarction 08/11/2015  . Renal insufficiency 08/11/2015  . Venous stasis 04/13/2015  . Depression 04/13/2015  . Essential hypertension 09/12/2014  . PVCs (premature ventricular contractions) 09/07/2014  . Precordial pain 09/07/2014   Guadelupe Sabin, OTR/L  [phone] 12/02/2018, 11:05 AM  [facility] [address] [place], [place], [address] Phone: [phone]   Fax:  [phone]  Name: Jennifer Coleman MRN: [id number] Date of Birth: 05-Nov-1950

## 2018-12-03 ENCOUNTER — Ambulatory Visit (INDEPENDENT_AMBULATORY_CARE_PROVIDER_SITE_OTHER): Payer: 59 | Admitting: Orthopaedic Surgery

## 2018-12-03 ENCOUNTER — Encounter (INDEPENDENT_AMBULATORY_CARE_PROVIDER_SITE_OTHER): Payer: Self-pay | Admitting: Orthopaedic Surgery

## 2018-12-03 VITALS — BP 113/60 | HR 86 | Ht 65.0 in | Wt 249.0 lb

## 2018-12-03 DIAGNOSIS — G8929 Other chronic pain: Secondary | ICD-10-CM

## 2018-12-03 DIAGNOSIS — M25512 Pain in left shoulder: Secondary | ICD-10-CM

## 2018-12-03 DIAGNOSIS — Z4789 Encounter for other orthopedic aftercare: Secondary | ICD-10-CM

## 2018-12-03 MED ORDER — DICLOFENAC SODIUM 1 % TD GEL: TRANSDERMAL | 2 refills | Status: DC

## 2018-12-03 NOTE — Progress Notes (Deleted)
Office Visit Note   Patient: Jennifer Coleman           Date of Birth: 1951-07-22           MRN: [id number] Visit Date: 12/03/2018              Requested by: Mikey Kirschner, [place], [place] [address] PCP: Mikey Kirschner, MD   Assessment & Plan: Visit Diagnoses: No diagnosis found.  Plan: ***  Follow-Up Instructions: No follow-ups on file.   Orders:  No orders of the defined types were placed in this encounter.  No orders of the defined types were placed in this encounter.     Procedures: No procedures performed   Clinical Data: No additional findings.   Subjective: Chief Complaint  Patient presents with  . Left Shoulder - Follow-up    Left Shoulder Arthroscopy with Subacromial Decompression And Distal Clavicle Excision, Mini Open Rotator Cuff Repair  Patient returns for one month follow up. She is status post left shoulder arthroscopy with SAD, DCE, and mini open rotator cuff repair on 09/30/18.  She states that she is doing better. She does continue to have stiffness and describes throbbing when she raises her arm. She is attending outpatient PT at [facility]  HPI  Review of Systems   Objective: Vital Signs: BP 113/60   Pulse 86   Ht '5\' 5"'$  (1.651 m)   Wt 249 lb (112.9 kg)   BMI 41.44 kg/m   Physical Exam  Ortho Exam  Specialty Comments:  No specialty comments available.  Imaging: No results found.   PMFS History: Patient Active Problem List   Diagnosis Date Noted  . Postoperative anemia due to acute blood loss 11/24/2018  . Acute cholecystitis 11/20/2018  . Impingement syndrome of left shoulder 09/30/2018  . Unspecified rotator cuff tear or rupture of right shoulder, not specified as traumatic 09/30/2018  . Acromioclavicular joint arthritis 09/30/2018  . Sun-damaged skin 01/03/2018  . Morbid obesity ([place]) 11/05/2017  . Depression with anxiety 11/05/2017  . Nontraumatic incomplete tear of right rotator cuff  09/25/2016  . Foreign body of shoulder right  09/25/2016  . 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 Shoulder joint painful on movement, right 08/29/2016  . Epigastric pain 08/11/2015  . Chest pain, rule out acute myocardial infarction 08/11/2015  . Renal insufficiency 08/11/2015  . Venous stasis 04/13/2015  . Depression 04/13/2015  . Essential hypertension 09/12/2014  . PVCs (premature ventricular contractions) 09/07/2014  . Precordial pain 09/07/2014   Past Medical History:  Diagnosis Date  . Anxiety   . Arthritis   . Dyspnea   . Dysrhythmia   . Family history of anesthesia complication 25 yrs ago   Sister allergic to sccinocholine - unable to come off ventilator  . Headache    started last night , pain level  8  . Hypertension   . Pneumonia   . PONV (postoperative nausea and vomiting)   . Premature ventricular contractions   . Shingles   . Varicose veins   . Varicose veins of left lower extremity     Family History  Problem Relation Age of Onset  . Heart disease Sister   . Heart disease Brother        41's  . Diabetes Mellitus II Sister   . Multiple myeloma Mother   . Hypertension Sister   . Hypertension Father   . Heart attack Father     Past Surgical  History:  Procedure Laterality Date  . CARPAL TUNNEL RELEASE    . CERVICAL DISC SURGERY  1992   Fusion  . CHOLECYSTECTOMY N/A 11/21/2018   Procedure: LAPAROSCOPIC CHOLECYSTECTOMY;  Surgeon: Virl Cagey, MD;  [facility];  Service: General;  Laterality: N/A;  . COLONOSCOPY N/A 06/07/2016   Procedure: COLONOSCOPY;  Surgeon: Rogene Houston, MD;  [facility];  Service: Endoscopy;  Laterality: N/A;  1:00  . KNEE ARTHROSCOPY  09/16/2012   Procedure: ARTHROSCOPY KNEE;  Surgeon: [name], MD;  Location: [facility];  Service: Orthopedics;  Laterality: Right;  Right Knee Arthroscopy  . SALPINGOOPHORECTOMY Left 1985  . SHOULDER ARTHROSCOPY WITH OPEN ROTATOR CUFF REPAIR AND DISTAL  CLAVICLE ACROMINECTOMY Right 05/03/2016   Procedure: RIGHT SHOULDER ARTHROSCOPY WITH MINI-OPEN ROTATOR CUFF REPAIR,DISTAL CLAVICLE RESECTION, SUBACROMIAL DECOMPRESSION.;  Surgeon: [name], MD;  Location: [facility];  Service: Orthopedics;  Laterality: Right;  . SHOULDER ARTHROSCOPY WITH ROTATOR CUFF REPAIR Right 09/25/2016   Procedure: Right Shoulder Manipulation, Arthroscopic Debridement of Joint, Removal of Loose Body and Re-Repair of Rotator Cuff;  Surgeon: [name], MD;  Location: [facility];  Service: Orthopedics;  Laterality: Right;   Social History   Occupational History  . Not on file  Tobacco Use  . Smoking status: Former Smoker    Types: Cigarettes    Last attempt to quit: 09/22/1992    Years since quitting: 26.2  . Smokeless tobacco: Never Used  Substance and Sexual Activity  . Alcohol use: No    Alcohol/week: 0.0 standard drinks  . Drug use: No  . Sexual activity: Yes

## 2018-12-03 NOTE — Progress Notes (Signed)
Office Visit Note   Patient: Jennifer Coleman           Date of Birth: June 30, 1951           MRN: [id number] Visit Date: 12/03/2018              Requested by: Mikey Kirschner, [place], [place] [address] PCP: Mikey Kirschner, MD   Assessment & Plan: Visit Diagnoses:  1. Chronic left shoulder pain     Plan: 9 weeks status post rotator cuff tear repair right shoulder and doing quite well in physical therapy.  Has not returned to work yet.  Will return in 2 weeks and reassess ability to return to work.  Had an acute gallbladder attack and has had a cholecystectomy since she was last here and doing well  Follow-Up Instructions: Return in about 2 weeks (around 12/17/2018).   Orders:  No orders of the defined types were placed in this encounter.  Meds ordered this encounter  Medications  . diclofenac sodium (VOLTAREN) 1 % GEL    Sig: Apply 2 grams to shoulder up to four times daily.    Dispense:  1 Tube    Refill:  2      Procedures: No procedures performed   Clinical Data: No additional findings.   Subjective: Chief Complaint  Patient presents with  . Left Shoulder - Follow-up    09/30/18 Left Shoulder Arthroscopy with Subacromial Decompression And Distal Clavicle Excision, Mini Open Rotator Cuff Repair  Doing well in room regards to her left shoulder.  Is in the midst of completing a course of physical therapy but has regained full overhead motion.  Had an acute gallbladder attack within the last several weeks and has had a cholecystectomy performed at [facility].  Doing well.  HPI  Review of Systems   Objective: Vital Signs: BP 113/60   Pulse 86   Ht '5\' 5"'$  (1.651 m)   Wt 249 lb (112.9 kg)   BMI 41.44 kg/m   Physical Exam  Ortho Exam left shoulder incisions healing without problem.  Some soreness in along the anterior lateral subacromial region but no induration or redness.  Full overhead motion in flexion.  Some loss of internal  rotation.  Negative impingement.  Good grip and good release.  Biceps intact  Specialty Comments:  No specialty comments available.  Imaging: No results found.   PMFS History: Patient Active Problem List   Diagnosis Date Noted  . Postoperative anemia due to acute blood loss 11/24/2018  . Acute cholecystitis 11/20/2018  . Impingement syndrome of left shoulder 09/30/2018  . Unspecified rotator cuff tear or rupture of right shoulder, not specified as traumatic 09/30/2018  . Acromioclavicular joint arthritis 09/30/2018  . Sun-damaged skin 01/03/2018  . Morbid obesity ([place]) 11/05/2017  . Depression with anxiety 11/05/2017  . Nontraumatic incomplete tear of right rotator cuff 09/25/2016  . Foreign body of shoulder right  09/25/2016  . 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 Shoulder joint painful on movement, right 08/29/2016  . Epigastric pain 08/11/2015  . Chest pain, rule out acute myocardial infarction 08/11/2015  . Renal insufficiency 08/11/2015  . Venous stasis 04/13/2015  . Depression 04/13/2015  . Essential hypertension 09/12/2014  . PVCs (premature ventricular contractions) 09/07/2014  . Precordial pain 09/07/2014   Past Medical History:  Diagnosis Date  . Anxiety   . Arthritis   . Dyspnea   . Dysrhythmia   . Family history  of anesthesia complication 25 yrs ago   Sister allergic to sccinocholine - unable to come off ventilator  . Headache    started last night , pain level  8  . Hypertension   . Pneumonia   . PONV (postoperative nausea and vomiting)   . Premature ventricular contractions   . Shingles   . Varicose veins   . Varicose veins of left lower extremity     Family History  Problem Relation Age of Onset  . Heart disease Sister   . Heart disease Brother        46's  . Diabetes Mellitus II Sister   . Multiple myeloma Mother   . Hypertension Sister   . Hypertension Father   . Heart attack Father     Past Surgical History:  Procedure  Laterality Date  . CARPAL TUNNEL RELEASE    . CERVICAL DISC SURGERY  1992   Fusion  . CHOLECYSTECTOMY N/A 11/21/2018   Procedure: LAPAROSCOPIC CHOLECYSTECTOMY;  Surgeon: Virl Cagey, MD;  [facility]: [facility];  Service: General;  Laterality: N/A;  . COLONOSCOPY N/A 06/07/2016   Procedure: COLONOSCOPY;  Surgeon: Rogene Houston, MD;  [facility];  Service: Endoscopy;  Laterality: N/A;  1:00  . KNEE ARTHROSCOPY  09/16/2012   Procedure: ARTHROSCOPY KNEE;  Surgeon: [name], MD;  Location: [place];  Service: Orthopedics;  Laterality: Right;  Right Knee Arthroscopy  . SALPINGOOPHORECTOMY Left 1985  . SHOULDER ARTHROSCOPY WITH OPEN ROTATOR CUFF REPAIR AND DISTAL CLAVICLE ACROMINECTOMY Right 05/03/2016   Procedure: RIGHT SHOULDER ARTHROSCOPY WITH MINI-OPEN ROTATOR CUFF REPAIR,DISTAL CLAVICLE RESECTION, SUBACROMIAL DECOMPRESSION.;  Surgeon: [name], MD;  Location: [place];  Service: Orthopedics;  Laterality: Right;  . SHOULDER ARTHROSCOPY WITH ROTATOR CUFF REPAIR Right 09/25/2016   Procedure: Right Shoulder Manipulation, Arthroscopic Debridement of Joint, Removal of Loose Body and Re-Repair of Rotator Cuff;  Surgeon: [name], MD;  Location: [place];  Service: Orthopedics;  Laterality: Right;   Social History   Occupational History  . Not on file  Tobacco Use  . Smoking status: Former Smoker    Types: Cigarettes    Last attempt to quit: 09/22/1992    Years since quitting: 26.2  . Smokeless tobacco: Never Used  Substance and Sexual Activity  . Alcohol use: No    Alcohol/week: 0.0 standard drinks  . Drug use: No  . Sexual activity: Yes     [name], MD   Note - This record has been created using [employer].  Chart creation errors have been sought, but may not always  have been located. Such creation errors do not reflect on  the standard of medical care.

## 2018-12-04 ENCOUNTER — Encounter: Payer: Self-pay | Admitting: General Surgery

## 2018-12-04 ENCOUNTER — Ambulatory Visit: Payer: 59 | Admitting: Psychiatry

## 2018-12-04 ENCOUNTER — Ambulatory Visit (INDEPENDENT_AMBULATORY_CARE_PROVIDER_SITE_OTHER): Payer: Self-pay | Admitting: General Surgery

## 2018-12-04 VITALS — BP 156/74 | HR 80 | Temp 97.3°F | Resp 18 | Wt 240.8 lb

## 2018-12-04 DIAGNOSIS — K810 Acute cholecystitis: Secondary | ICD-10-CM

## 2018-12-04 NOTE — Patient Instructions (Signed)
Activity and diet as tolerated.    

## 2018-12-04 NOTE — Progress Notes (Signed)
[facility] Note   HPI:  68 y.o. Female presents to clinic for post-op follow-up evaluation after a laparoscopic cholecystectomy. Patient reports she is doing well. She says she is still a little weak but overall is getting better. She says that she is taking naps and feels that she is getting her appetite back.  Review of Systems:  Some constipation, on stool softeners  Appetite improving  All other review of systems: otherwise negative   Pathology: Diagnosis Gallbladder - ACUTE CHOLECYSTITIS WITH CHOLELITHIASIS. - ONE BENIGN LYMPH NODE.  Vital Signs:  BP (!) 156/74 (BP Location: Left Arm, Patient Position: Sitting, Cuff Size: Large)   Pulse 80   Temp (!) 97.3 F (36.3 C) (Temporal)   Resp 18   Wt 240 lb 12.8 oz (109.2 kg)   BMI 40.07 kg/m    Physical Exam:  Physical Exam Vitals signs reviewed.  Cardiovascular:     Rate and Rhythm: Normal rate.  Pulmonary:     Effort: Pulmonary effort is normal.  Abdominal:     General: There is no distension.     Palpations: Abdomen is soft.     Tenderness: There is no abdominal tenderness.     Comments: Port sites healing, no erythema or drainage  Neurological:     Mental Status: She is alert.     Assessment:  68 y.o. yo Female s/p laparoscopic cholecystectomy for acute cholecystitis. Doing well but is a little weak after the post operative bleed with anemia. We recommended iron and vitamin C over the phone.   Plan:  - Continue Iron and Vitamin C for now   - Diet and activity as tolerated  - Let us know if still having issues - PRN Follow up   All of the above recommendations were discussed with the patient, and all of patient's questions were answered to her expressed satisfaction.  Curlene Labrum, MD [facility] [address] [place], [place] [address] [phone] (office)

## 2018-12-05 ENCOUNTER — Encounter (HOSPITAL_COMMUNITY): Payer: Self-pay

## 2018-12-08 ENCOUNTER — Ambulatory Visit: Payer: Self-pay

## 2018-12-09 ENCOUNTER — Ambulatory Visit (HOSPITAL_COMMUNITY): Payer: 59

## 2018-12-09 ENCOUNTER — Encounter (HOSPITAL_COMMUNITY): Payer: Self-pay

## 2018-12-09 DIAGNOSIS — M25612 Stiffness of left shoulder, not elsewhere classified: Secondary | ICD-10-CM | POA: Diagnosis not present

## 2018-12-09 DIAGNOSIS — R29898 Other symptoms and signs involving the musculoskeletal system: Secondary | ICD-10-CM | POA: Diagnosis not present

## 2018-12-09 DIAGNOSIS — M25512 Pain in left shoulder: Secondary | ICD-10-CM | POA: Diagnosis not present

## 2018-12-09 NOTE — Patient Instructions (Signed)
Complete the following exercises 2-3 times a day.    Copyright  VHI. All rights reserved.   Internal Rotation Across Back  Grab the end of a towel with your affected side, palm facing backwards. Grab the towel with your unaffected side and pull your affected hand across your back until you feel a stretch in the front of your shoulder. If you feel pain, pull just to the pain, do not pull through the pain. Hold. Return your affected arm to your side. Try to keep your hand/arm close to your body during the entire movement.  Make sure you standing straight before you even attempt to pull on towel    Hold for 30 seconds. Complete 1 times.          Wall Flexion  Slide your arm up the wall or door frame until a stretch is felt in your shoulder . Hold for 30 seconds. Complete 1 times     Shoulder Abduction Stretch  Stand side ways by a wall with affected up on wall. Gently step in toward wall to feel stretch. Hold for 30 seconds. Complete 1 times.    EXTERNAL ROTATION STRETCH  Place arm along edge of door as pictured.  Keep elbow and shoulder at 90 degree angles.  Gently lean the upper torso forward until a stretch is felt in the shoulder. Hold 30  seconds; Complete 1 time.

## 2018-12-09 NOTE — Therapy (Signed)
[place], [place], [address] Phone: [phone]   Fax:  [phone]  Occupational Therapy Treatment  Patient Details  Name: Jennifer Coleman MRN: [id number] Date of Birth: 24-Jan-1951 Referring Provider (OT): Dr. Joni Fears   Encounter Date: 12/09/2018  OT End of Session - 12/09/18 1204    Visit Number  12    Number of Visits  16    Date for OT Re-Evaluation  12/29/18    Authorization Type  [employer]    Authorization Time Period  25 visit limit    Authorization - Visit Number  7    Authorization - Number of Visits  25    OT Start Time  [phone]    OT Stop Time  1036    OT Time Calculation (min)  41 min    Activity Tolerance  Patient tolerated treatment well    Behavior During Therapy  [facility] for tasks assessed/performed       Past Medical History:  Diagnosis Date  . Anxiety   . Arthritis   . Dyspnea   . Dysrhythmia   . Family history of anesthesia complication 25 yrs ago   Sister allergic to sccinocholine - unable to come off ventilator  . Headache    started last night , pain level  8  . Hypertension   . Pneumonia   . PONV (postoperative nausea and vomiting)   . Premature ventricular contractions   . Shingles   . Varicose veins   . Varicose veins of left lower extremity     Past Surgical History:  Procedure Laterality Date  . CARPAL TUNNEL RELEASE    . CERVICAL DISC SURGERY  1992   Fusion  . CHOLECYSTECTOMY N/A 11/21/2018   Procedure: LAPAROSCOPIC CHOLECYSTECTOMY;  Surgeon: Virl Cagey, MD;  [facility];  Service: General;  Laterality: N/A;  . COLONOSCOPY N/A 06/07/2016   Procedure: COLONOSCOPY;  Surgeon: Rogene Houston, MD;  [facility];  Service: Endoscopy;  Laterality: N/A;  1:00  . KNEE ARTHROSCOPY  09/16/2012   Procedure: ARTHROSCOPY KNEE;  Surgeon: [name], MD;  Location: [place];  Service: Orthopedics;  Laterality: Right;  Right Knee Arthroscopy  .  SALPINGOOPHORECTOMY Left 1985  . SHOULDER ARTHROSCOPY WITH OPEN ROTATOR CUFF REPAIR AND DISTAL CLAVICLE ACROMINECTOMY Right 05/03/2016   Procedure: RIGHT SHOULDER ARTHROSCOPY WITH MINI-OPEN ROTATOR CUFF REPAIR,DISTAL CLAVICLE RESECTION, SUBACROMIAL DECOMPRESSION.;  Surgeon: [name], MD;  Location: [place];  Service: Orthopedics;  Laterality: Right;  . SHOULDER ARTHROSCOPY WITH ROTATOR CUFF REPAIR Right 09/25/2016   Procedure: Right Shoulder Manipulation, Arthroscopic Debridement of Joint, Removal of Loose Body and Re-Repair of Rotator Cuff;  Surgeon: [name], MD;  Location: [place];  Service: Orthopedics;  Laterality: Right;    There were no vitals filed for this visit.  Subjective Assessment - 12/09/18 1013    Subjective   S: My husband has been rubbing my arm for me.     Currently in Pain?  Yes    Pain Score  4     Pain Location  Shoulder    Pain Orientation  Left    Pain Descriptors / Indicators  Sore    Pain Type  Acute pain    Pain Radiating Towards  None    Pain Onset  More than a month ago    Pain Frequency  Intermittent    Aggravating Factors   movement    Pain Relieving  Factors  rest, stretching    Effect of Pain on Daily Activities  min/mod effect    Multiple Pain Sites  No         [facility] OT Assessment - 12/09/18 1014      Assessment   Medical Diagnosis  S/P Left RCR, SAD/DCE      Precautions   Precautions  Shoulder    Type of Shoulder Precautions  Progress as tolerated.               OT Treatments/Exercises (OP) - 12/09/18 1014      Exercises   Exercises  Shoulder      Shoulder Exercises: Supine   Protraction  PROM;5 reps    Horizontal ABduction  PROM;5 reps    External Rotation  PROM;5 reps    Internal Rotation  PROM;5 reps    Flexion  PROM;5 reps    ABduction  PROM;5 reps      Shoulder Exercises: Seated   Horizontal ABduction  AROM;15 reps    Flexion  AROM;15 reps    Abduction  AROM;15 reps      Shoulder  Exercises: ROM/Strengthening   Over Head Lace  2' seated at bodycraft    Proximal Shoulder Strengthening, Seated  15X each 1 rest break      Shoulder Exercises: Stretch   Internal Rotation Stretch  2 reps   20 seconds     Manual Therapy   Manual Therapy  Myofascial release    Manual therapy comments  manual therapy completed seperately from all other interventions    Myofascial Release  myofascial release and manual stretching to left upper arm, scapular and shoulder region to decrease pain and restrictions and improve pain free mobility.               OT Education - 12/09/18 1204    Education Details  shoulder stretches    Person(s) Educated  Patient    Methods  Explanation;Demonstration;Handout    Comprehension  Verbalized understanding;Returned demonstration       OT Short Term Goals - 12/09/18 1207      OT SHORT TERM GOAL #1   Title  Patient will be educated on a HEP for left shoulder ROM and strength.    Time  4    Period  Weeks      OT SHORT TERM GOAL #2   Title  Patient will improve left shoulder  P/ROM to [facility] in order to complete B/ADLs with increased independence.    Time  4    Period  Weeks      OT SHORT TERM GOAL #3   Title  Patient will improve left shoulder and strength to 3+/5 for increased ability to pick up baskets of laundry.    Time  4    Period  Weeks    Status  Partially Met      OT SHORT TERM GOAL #4   Title  Patient will decrease left shoulder pain to 4/10 or better in order to complete B/ADLs with greater independence.    Time  4    Period  Weeks    Status  On-going      OT SHORT TERM GOAL #5   Title  Patient will decrease fascial restrictions to moderate amount in left shoulder region for greater mobility needed for daily tasks.     Time  4    Period  Weeks    Status  Achieved        OT  Long Term Goals - 12/09/18 1208      OT LONG TERM GOAL #1   Title  Patient will return to prior level of independence with all B/IADLs, work,  and leisure activities using her left arm normally.     Time  8    Period  Weeks    Status  On-going      OT LONG TERM GOAL #2   Title  Patient will improve left shoulder A/ROM to WNL for greater ability to use left arm actively work and home.     Time  8    Period  Weeks    Status  On-going      OT LONG TERM GOAL #3   Title  Patient will improve left shoulder and elbow strength to 5/5 for greater ability to lift bags of mulch when completing yardwork.      Time  8    Period  Weeks    Status  On-going      OT LONG TERM GOAL #4   Title  Patient will decrease left shoulder pain to 2/10 or better when completing functional actiivites.     Time  8    Period  Weeks    Status  On-going      OT LONG TERM GOAL #5   Title  Paitent will decrease left fascial restrictions in left shoulder to trace amount in order to have greater mobility needed for use of left arm with functional activiites.      Time  8    Period  Weeks            Plan - 12/09/18 1204    Clinical Impression Statement  A: Minimal fascial restrictions noted in medial deltoid. Small trigger point found at medial superior border of left scapula with manual techniques completed to address. Focused on session on shoulder stretches with proper form and technique. Patient required cueing throughout session to return demonstration with proper form and technique.     Plan  P: Follow up on shoulder stretches provided to HEP at last session. Patient is to follow up with MD next week. She may want to wait and see what he recommends regarding continueing therapy.     Consulted and Agree with Plan of Care  Patient       Patient will benefit from skilled therapeutic intervention in order to improve the following deficits and impairments:  Decreased range of motion, Impaired flexibility, Increased muscle spasms, Impaired UE functional use, Pain, Decreased strength  Visit Diagnosis: Stiffness of left shoulder, not elsewhere  classified  Acute pain of left shoulder  Other symptoms and signs involving the musculoskeletal system    Problem List Patient Active Problem List   Diagnosis Date Noted  . Postoperative anemia due to acute blood loss 11/24/2018  . Acute cholecystitis 11/20/2018  . Impingement syndrome of left shoulder 09/30/2018  . Unspecified rotator cuff tear or rupture of right shoulder, not specified as traumatic 09/30/2018  . Acromioclavicular joint arthritis 09/30/2018  . Sun-damaged skin 01/03/2018  . Morbid obesity ([place]) 11/05/2017  . Depression with anxiety 11/05/2017  . Nontraumatic incomplete tear of right rotator cuff 09/25/2016  . Foreign body of shoulder right  09/25/2016  . 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 Shoulder joint painful on movement, right 08/29/2016  . Epigastric pain 08/11/2015  . Chest pain, rule out acute myocardial infarction 08/11/2015  . Renal insufficiency 08/11/2015  . Venous stasis 04/13/2015  . Depression 04/13/2015  .  Essential hypertension 09/12/2014  . PVCs (premature ventricular contractions) 09/07/2014  . Precordial pain 09/07/2014   Ailene Ravel, OTR/L,CBIS  [phone]  12/09/2018, 12:09 PM  [facility] [address] [place], [place], [address] Phone: [phone]   Fax:  [phone]  Name: DENZIL BRISTOL MRN: [id number] Date of Birth: [birth year]

## 2018-12-10 ENCOUNTER — Encounter (HOSPITAL_COMMUNITY): Payer: Self-pay | Admitting: Occupational Therapy

## 2018-12-10 ENCOUNTER — Ambulatory Visit (HOSPITAL_COMMUNITY): Payer: 59 | Admitting: Occupational Therapy

## 2018-12-10 DIAGNOSIS — M25512 Pain in left shoulder: Secondary | ICD-10-CM

## 2018-12-10 DIAGNOSIS — M25612 Stiffness of left shoulder, not elsewhere classified: Secondary | ICD-10-CM

## 2018-12-10 DIAGNOSIS — R29898 Other symptoms and signs involving the musculoskeletal system: Secondary | ICD-10-CM

## 2018-12-10 NOTE — Therapy (Signed)
[place], [place], [address] Phone: [phone]   Fax:  [phone]  Occupational Therapy Treatment  Patient Details  Name: Jennifer Coleman MRN: [id number] Date of Birth: 1950-12-21 Referring Provider (OT): Dr. Joni Fears   Encounter Date: 12/10/2018  OT End of Session - 12/10/18 0945    Visit Number  13    Number of Visits  16    Date for OT Re-Evaluation  12/29/18    Authorization Type  [facility]    Authorization Time Period  25 visit limit    Authorization - Visit Number  8    Authorization - Number of Visits  25    OT Start Time  [id number]    OT Stop Time  0945    OT Time Calculation (min)  40 min    Activity Tolerance  Patient tolerated treatment well    Behavior During Therapy  [facility] for tasks assessed/performed       Past Medical History:  Diagnosis Date  . Anxiety   . Arthritis   . Dyspnea   . Dysrhythmia   . Family history of anesthesia complication 25 yrs ago   Sister allergic to sccinocholine - unable to come off ventilator  . Headache    started last night , pain level  8  . Hypertension   . Pneumonia   . PONV (postoperative nausea and vomiting)   . Premature ventricular contractions   . Shingles   . Varicose veins   . Varicose veins of left lower extremity     Past Surgical History:  Procedure Laterality Date  . CARPAL TUNNEL RELEASE    . CERVICAL DISC SURGERY  1992   Fusion  . CHOLECYSTECTOMY N/A 11/21/2018   Procedure: LAPAROSCOPIC CHOLECYSTECTOMY;  Surgeon: Virl Cagey, MD;  Location: [facility];  Service: General;  Laterality: N/A;  . COLONOSCOPY N/A 06/07/2016   Procedure: COLONOSCOPY;  Surgeon: Rogene Houston, MD;  [facility];  Service: Endoscopy;  Laterality: N/A;  1:00  . KNEE ARTHROSCOPY  09/16/2012   Procedure: ARTHROSCOPY KNEE;  Surgeon: [name], MD;  Location: [place];  Service: Orthopedics;  Laterality: Right;  Right Knee Arthroscopy  .  SALPINGOOPHORECTOMY Left 1985  . SHOULDER ARTHROSCOPY WITH OPEN ROTATOR CUFF REPAIR AND DISTAL CLAVICLE ACROMINECTOMY Right 05/03/2016   Procedure: RIGHT SHOULDER ARTHROSCOPY WITH MINI-OPEN ROTATOR CUFF REPAIR,DISTAL CLAVICLE RESECTION, SUBACROMIAL DECOMPRESSION.;  Surgeon: [name], MD;  Location: [place];  Service: Orthopedics;  Laterality: Right;  . SHOULDER ARTHROSCOPY WITH ROTATOR CUFF REPAIR Right 09/25/2016   Procedure: Right Shoulder Manipulation, Arthroscopic Debridement of Joint, Removal of Loose Body and Re-Repair of Rotator Cuff;  Surgeon: [name], MD;  Location: [place];  Service: Orthopedics;  Laterality: Right;    There were no vitals filed for this visit.  Subjective Assessment - 12/10/18 0907    Subjective   S: I can't wait to get my arm up more.     Currently in Pain?  Yes    Pain Score  4     Pain Location  Shoulder    Pain Orientation  Left    Pain Descriptors / Indicators  Aching;Sore    Pain Type  Acute pain    Pain Radiating Towards  none    Pain Onset  More than a month ago    Pain Frequency  Intermittent    Aggravating Factors   movement    Pain Relieving  Factors  rest, stretching    Effect of Pain on Daily Activities  min/mod effect    Multiple Pain Sites  No         [facility] OT Assessment - 12/10/18 0907      Assessment   Medical Diagnosis  S/P Left RCR, SAD/DCE      Precautions   Precautions  Shoulder    Type of Shoulder Precautions  Progress as tolerated.               OT Treatments/Exercises (OP) - 12/10/18 0908      Exercises   Exercises  Shoulder      Shoulder Exercises: Supine   Protraction  PROM;5 reps    Horizontal ABduction  PROM;5 reps    External Rotation  PROM;5 reps    Internal Rotation  PROM;5 reps    Flexion  PROM;5 reps    ABduction  PROM;5 reps      Shoulder Exercises: Sidelying   External Rotation  AROM;12 reps    Internal Rotation  AROM;12 reps    Flexion  AROM;12 reps     ABduction  AROM;12 reps    Other Sidelying Exercises  protraction, A/ROM, 12X    Other Sidelying Exercises  horizontal abduction, A/ROM, 12X      Shoulder Exercises: Standing   Protraction  AROM;15 reps    Horizontal ABduction  AROM;15 reps    External Rotation  AROM;15 reps    Internal Rotation  AROM;15 reps    Flexion  AROM;15 reps    ABduction  AROM;15 reps    Extension  Theraband;15 reps    Theraband Level (Shoulder Extension)  Level 2 (Red)    Row  Theraband;15 reps    Theraband Level (Shoulder Row)  Level 2 (Red)    Retraction  Theraband;15 reps    Theraband Level (Shoulder Retraction)  Level 2 (Red)      Shoulder Exercises: ROM/Strengthening   X to V Arms  10X    Proximal Shoulder Strengthening, Seated  15X each 2 rest breaks    Other ROM/Strengthening Exercises  Pt passed tennis ball behind back working on IR, 15X      Shoulder Exercises: Stretch   Internal Rotation Stretch  3 reps   20" each, horizontal towel   External Rotation Stretch  3 reps;20 seconds    Wall Stretch - Flexion  3 reps;20 seconds    Other Shoulder Stretches  doorway stretch, 3 reps, 20" each      Manual Therapy   Manual Therapy  Myofascial release    Manual therapy comments  manual therapy completed seperately from all other interventions    Myofascial Release  myofascial release and manual stretching to left upper arm, scapular and shoulder region to decrease pain and restrictions and improve pain free mobility.                 OT Short Term Goals - 12/09/18 1207      OT SHORT TERM GOAL #1   Title  Patient will be educated on a HEP for left shoulder ROM and strength.    Time  4    Period  Weeks      OT SHORT TERM GOAL #2   Title  Patient will improve left shoulder  P/ROM to [facility] in order to complete B/ADLs with increased independence.    Time  4    Period  Weeks      OT SHORT TERM GOAL #3   Title  Patient will improve left shoulder and strength to 3+/5 for increased ability to  pick up baskets of laundry.    Time  4    Period  Weeks    Status  Partially Met      OT SHORT TERM GOAL #4   Title  Patient will decrease left shoulder pain to 4/10 or better in order to complete B/ADLs with greater independence.    Time  4    Period  Weeks    Status  On-going      OT SHORT TERM GOAL #5   Title  Patient will decrease fascial restrictions to moderate amount in left shoulder region for greater mobility needed for daily tasks.     Time  4    Period  Weeks    Status  Achieved        OT Long Term Goals - 12/09/18 1208      OT LONG TERM GOAL #1   Title  Patient will return to prior level of independence with all B/IADLs, work, and leisure activities using her left arm normally.     Time  8    Period  Weeks    Status  On-going      OT LONG TERM GOAL #2   Title  Patient will improve left shoulder A/ROM to WNL for greater ability to use left arm actively work and home.     Time  8    Period  Weeks    Status  On-going      OT LONG TERM GOAL #3   Title  Patient will improve left shoulder and elbow strength to 5/5 for greater ability to lift bags of mulch when completing yardwork.      Time  8    Period  Weeks    Status  On-going      OT LONG TERM GOAL #4   Title  Patient will decrease left shoulder pain to 2/10 or better when completing functional actiivites.     Time  8    Period  Weeks    Status  On-going      OT LONG TERM GOAL #5   Title  Paitent will decrease left fascial restrictions in left shoulder to trace amount in order to have greater mobility needed for use of left arm with functional activiites.      Time  8    Period  Weeks            Plan - 12/10/18 6440    Clinical Impression Statement  A: Pt reports HEP is going ok so far. Continued with manual therapy to address trigger point at medial superior border of scapula. Added sidelying A/ROM this session for improved scapular stability, pt performed with good form, occasional verbal cuing.  Continued with shoulder stretches and resumed standing A/ROM and scapular theraband exercises. Verbal cuing for form and technique.     Plan  P: Follow up on MD appt. Find out if these will be final 2 visits or if MD requests continuing therapy       Patient will benefit from skilled therapeutic intervention in order to improve the following deficits and impairments:  Decreased range of motion, Impaired flexibility, Increased muscle spasms, Impaired UE functional use, Pain, Decreased strength  Visit Diagnosis: Stiffness of left shoulder, not elsewhere classified  Acute pain of left shoulder  Other symptoms and signs involving the musculoskeletal system    Problem List Patient Active Problem List   Diagnosis  Date Noted  . Postoperative anemia due to acute blood loss 11/24/2018  . Acute cholecystitis 11/20/2018  . Impingement syndrome of left shoulder 09/30/2018  . Unspecified rotator cuff tear or rupture of right shoulder, not specified as traumatic 09/30/2018  . Acromioclavicular joint arthritis 09/30/2018  . Sun-damaged skin 01/03/2018  . Morbid obesity ([place]) 11/05/2017  . Depression with anxiety 11/05/2017  . Nontraumatic incomplete tear of right rotator cuff 09/25/2016  . Foreign body of shoulder right  09/25/2016  . 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 Shoulder joint painful on movement, right 08/29/2016  . Epigastric pain 08/11/2015  . Chest pain, rule out acute myocardial infarction 08/11/2015  . Renal insufficiency 08/11/2015  . Venous stasis 04/13/2015  . Depression 04/13/2015  . Essential hypertension 09/12/2014  . PVCs (premature ventricular contractions) 09/07/2014  . Precordial pain 09/07/2014   Guadelupe Sabin, OTR/L  [phone] 12/10/2018, 9:46 AM  [facility] [address] [place], [place], [address] Phone: [phone]   Fax:  [phone]  Name: ANGE PUSKAS MRN: [id number] Date of Birth:  01-31-1951

## 2018-12-15 ENCOUNTER — Ambulatory Visit (INDEPENDENT_AMBULATORY_CARE_PROVIDER_SITE_OTHER): Payer: 59 | Admitting: Orthopaedic Surgery

## 2018-12-15 ENCOUNTER — Encounter (INDEPENDENT_AMBULATORY_CARE_PROVIDER_SITE_OTHER): Payer: Self-pay | Admitting: Orthopaedic Surgery

## 2018-12-15 DIAGNOSIS — M25512 Pain in left shoulder: Secondary | ICD-10-CM

## 2018-12-15 DIAGNOSIS — G8929 Other chronic pain: Secondary | ICD-10-CM

## 2018-12-15 NOTE — Progress Notes (Signed)
Office Visit Note   Patient: Jennifer Coleman           Date of Birth: 1951/04/18           MRN: [id number] Visit Date: 12/15/2018              Requested by: Mikey Kirschner, [place], [place] [address] PCP: Mikey Kirschner, MD   Assessment & Plan: Visit Diagnoses:  1. Chronic left shoulder pain     Plan: 2-1/2 months status post rotator cuff tear repair left shoulder doing well.  Has been going to therapy with excellent range of motion.  Still little weak and needs to work on strengthening exercises. Follow-Up Instructions: Return in about 6 weeks (around 01/26/2019).   Orders:  No orders of the defined types were placed in this encounter.  No orders of the defined types were placed in this encounter.     Procedures: No procedures performed   Clinical Data: No additional findings.   Subjective: No chief complaint on file. 2-1/2 months status post rotator cuff tear repair left shoulder.  Working on range of motion.  Able to place her arm overhead but notes she is still little bit "stiff".  Would like to return to work next Monday the 24th  HPI  Review of Systems   Objective: Vital Signs: There were no vitals taken for this visit.  Physical Exam able to place left arm fully overhead.  It was slow but not uncomfortable.  No evidence of adhesive capsulitis.  Able to touch the lower part of her back.  Skin intact.  Incisions of healed.  Good grip and good release  Ortho Exam  Specialty Comments:  No specialty comments available.  Imaging: No results found.   PMFS History: Patient Active Problem List   Diagnosis Date Noted  . Postoperative anemia due to acute blood loss 11/24/2018  . Acute cholecystitis 11/20/2018  . Impingement syndrome of left shoulder 09/30/2018  . Unspecified rotator cuff tear or rupture of right shoulder, not specified as traumatic 09/30/2018  . Acromioclavicular joint arthritis 09/30/2018  . Sun-damaged skin  01/03/2018  . Morbid obesity ([place]) 11/05/2017  . Depression with anxiety 11/05/2017  . Nontraumatic incomplete tear of right rotator cuff 09/25/2016  . Foreign body of shoulder right  09/25/2016  . 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 Shoulder joint painful on movement, right 08/29/2016  . Epigastric pain 08/11/2015  . Chest pain, rule out acute myocardial infarction 08/11/2015  . Renal insufficiency 08/11/2015  . Venous stasis 04/13/2015  . Depression 04/13/2015  . Essential hypertension 09/12/2014  . PVCs (premature ventricular contractions) 09/07/2014  . Precordial pain 09/07/2014   Past Medical History:  Diagnosis Date  . Anxiety   . Arthritis   . Dyspnea   . Dysrhythmia   . Family history of anesthesia complication 25 yrs ago   Sister allergic to sccinocholine - unable to come off ventilator  . Headache    started last night , pain level  8  . Hypertension   . Pneumonia   . PONV (postoperative nausea and vomiting)   . Premature ventricular contractions   . Shingles   . Varicose veins   . Varicose veins of left lower extremity     Family History  Problem Relation Age of Onset  . Heart disease Sister   . Heart disease Brother        35's  . Diabetes Mellitus II Sister   .  Multiple myeloma Mother   . Hypertension Sister   . Hypertension Father   . Heart attack Father     Past Surgical History:  Procedure Laterality Date  . CARPAL TUNNEL RELEASE    . CERVICAL DISC SURGERY  1992   Fusion  . CHOLECYSTECTOMY N/A 11/21/2018   Procedure: LAPAROSCOPIC CHOLECYSTECTOMY;  Surgeon: Virl Cagey, MD;  [facility];  Service: General;  Laterality: N/A;  . COLONOSCOPY N/A 06/07/2016   Procedure: COLONOSCOPY;  Surgeon: Rogene Houston, MD;  [facility];  Service: Endoscopy;  Laterality: N/A;  1:00  . KNEE ARTHROSCOPY  09/16/2012   Procedure: ARTHROSCOPY KNEE;  Surgeon: [name], MD;  Location: [facility] [place];  Service: Orthopedics;   Laterality: Right;  Right Knee Arthroscopy  . SALPINGOOPHORECTOMY Left 1985  . SHOULDER ARTHROSCOPY WITH OPEN ROTATOR CUFF REPAIR AND DISTAL CLAVICLE ACROMINECTOMY Right 05/03/2016   Procedure: RIGHT SHOULDER ARTHROSCOPY WITH MINI-OPEN ROTATOR CUFF REPAIR,DISTAL CLAVICLE RESECTION, SUBACROMIAL DECOMPRESSION.;  Surgeon: [name], MD;  Location: [facility];  Service: Orthopedics;  Laterality: Right;  . SHOULDER ARTHROSCOPY WITH ROTATOR CUFF REPAIR Right 09/25/2016   Procedure: Right Shoulder Manipulation, Arthroscopic Debridement of Joint, Removal of Loose Body and Re-Repair of Rotator Cuff;  Surgeon: [name], MD;  Location: [facility];  Service: Orthopedics;  Laterality: Right;   Social History   Occupational History  . Not on file  Tobacco Use  . Smoking status: Former Smoker    Types: Cigarettes    Last attempt to quit: 09/22/1992    Years since quitting: 26.2  . Smokeless tobacco: Never Used  Substance and Sexual Activity  . Alcohol use: No    Alcohol/week: 0.0 standard drinks  . Drug use: No  . Sexual activity: Yes     [name], MD   Note - This record has been created using [employer].  Chart creation errors have been sought, but may not always  have been located. Such creation errors do not reflect on  the standard of medical care.

## 2018-12-17 ENCOUNTER — Ambulatory Visit (HOSPITAL_COMMUNITY): Payer: 59 | Admitting: Occupational Therapy

## 2018-12-17 ENCOUNTER — Encounter (HOSPITAL_COMMUNITY): Payer: Self-pay | Admitting: Occupational Therapy

## 2018-12-17 DIAGNOSIS — M25612 Stiffness of left shoulder, not elsewhere classified: Secondary | ICD-10-CM | POA: Diagnosis not present

## 2018-12-17 DIAGNOSIS — R29898 Other symptoms and signs involving the musculoskeletal system: Secondary | ICD-10-CM

## 2018-12-17 DIAGNOSIS — M25512 Pain in left shoulder: Secondary | ICD-10-CM | POA: Diagnosis not present

## 2018-12-17 NOTE — Patient Instructions (Signed)
External Rotation Stretch:     Place your affected hand on the wall with the elbow bent and gently turn your body the opposite direction until a stretch is felt. Hold 10 seconds, repeat 3-5X. Complete 1-2 times/day.   OR    Standing in an open doorway, place your arm on the edge of the doorway with the shoulder at 90 degrees from the side and elbow bent to 90 degrees. Lean forward until you feel a stretch on the front of your shoulder. Keep neck relaxed. Hold 10 seconds, repeat 3-5X. Complete 1-2 times/day

## 2018-12-17 NOTE — Therapy (Signed)
[place], [place], [address] Phone: [phone]   Fax:  [phone]  Occupational Therapy Treatment  Patient Details  Name: Jennifer Coleman MRN: [id number] Date of Birth: 1951/08/11 Referring Provider (OT): Dr. Joni Fears   Encounter Date: 12/17/2018  OT End of Session - 12/17/18 0956    Visit Number  14    Number of Visits  16    Date for OT Re-Evaluation  12/29/18    Authorization Type  UMR    Authorization Time Period  25 visit limit    Authorization - Visit Number  9    Authorization - Number of Visits  25    OT Start Time  0907    OT Stop Time  0953    OT Time Calculation (min)  46 min    Activity Tolerance  Patient tolerated treatment well    Behavior During Therapy  [facility] for tasks assessed/performed       Past Medical History:  Diagnosis Date  . Anxiety   . Arthritis   . Dyspnea   . Dysrhythmia   . Family history of anesthesia complication 25 yrs ago   Sister allergic to sccinocholine - unable to come off ventilator  . Headache    started last night , pain level  8  . Hypertension   . Pneumonia   . PONV (postoperative nausea and vomiting)   . Premature ventricular contractions   . Shingles   . Varicose veins   . Varicose veins of left lower extremity     Past Surgical History:  Procedure Laterality Date  . CARPAL TUNNEL RELEASE    . CERVICAL DISC SURGERY  1992   Fusion  . CHOLECYSTECTOMY N/A 11/21/2018   Procedure: LAPAROSCOPIC CHOLECYSTECTOMY;  Surgeon: Virl Cagey, MD;  [facility];  Service: General;  Laterality: N/A;  . COLONOSCOPY N/A 06/07/2016   Procedure: COLONOSCOPY;  Surgeon: Rogene Houston, MD;  [facility];  Service: Endoscopy;  Laterality: N/A;  1:00  . KNEE ARTHROSCOPY  09/16/2012   Procedure: ARTHROSCOPY KNEE;  Surgeon: [name], MD;  Location: [place];  Service: Orthopedics;  Laterality: Right;  Right Knee Arthroscopy  .  SALPINGOOPHORECTOMY Left 1985  . SHOULDER ARTHROSCOPY WITH OPEN ROTATOR CUFF REPAIR AND DISTAL CLAVICLE ACROMINECTOMY Right 05/03/2016   Procedure: RIGHT SHOULDER ARTHROSCOPY WITH MINI-OPEN ROTATOR CUFF REPAIR,DISTAL CLAVICLE RESECTION, SUBACROMIAL DECOMPRESSION.;  Surgeon: [name], MD;  Location: [place];  Service: Orthopedics;  Laterality: Right;  . SHOULDER ARTHROSCOPY WITH ROTATOR CUFF REPAIR Right 09/25/2016   Procedure: Right Shoulder Manipulation, Arthroscopic Debridement of Joint, Removal of Loose Body and Re-Repair of Rotator Cuff;  Surgeon: [name], MD;  Location: [place];  Service: Orthopedics;  Laterality: Right;    There were no vitals filed for this visit.  Subjective Assessment - 12/17/18 0906    Subjective   S: I go back to work on Monday.     Currently in Pain?  Yes    Pain Score  3     Pain Location  Shoulder    Pain Orientation  Left    Pain Descriptors / Indicators  Sore    Pain Type  Acute pain    Pain Radiating Towards  none    Pain Onset  More than a month ago    Pain Frequency  Intermittent    Aggravating Factors   movement, sleeping on it    Pain  Relieving Factors  rest, stretching    Effect of Pain on Daily Activities  min/mod effect    Multiple Pain Sites  No         [facility] OT Assessment - 12/17/18 0906      Assessment   Medical Diagnosis  S/P Left RCR, SAD/DCE      Precautions   Precautions  Shoulder    Type of Shoulder Precautions  Progress as tolerated.               OT Treatments/Exercises (OP) - 12/17/18 0918      Exercises   Exercises  Shoulder      Shoulder Exercises: Supine   Protraction  PROM;5 reps    Horizontal ABduction  PROM;5 reps    External Rotation  PROM;5 reps    Internal Rotation  PROM;5 reps    Flexion  PROM;5 reps    ABduction  PROM;5 reps      Shoulder Exercises: Sidelying   External Rotation  AROM;12 reps    Internal Rotation  AROM;12 reps    Flexion  AROM;12 reps     ABduction  AROM;12 reps    Other Sidelying Exercises  protraction, A/ROM, 12X    Other Sidelying Exercises  horizontal abduction, A/ROM, 12X      Shoulder Exercises: Standing   Protraction  Strengthening;12 reps    Protraction Weight (lbs)  1    Horizontal ABduction  Strengthening;12 reps    Horizontal ABduction Weight (lbs)  1    External Rotation  Strengthening;12 reps    External Rotation Weight (lbs)  1    Internal Rotation  Strengthening;12 reps    Internal Rotation Weight (lbs)  1    Flexion  Strengthening;12 reps    Shoulder Flexion Weight (lbs)  1    ABduction  AROM;12 reps    Shoulder ABduction Weight (lbs)  --    Extension  Theraband;15 reps    Theraband Level (Shoulder Extension)  Level 2 (Red)    Row  Theraband;15 reps    Theraband Level (Shoulder Row)  Level 2 (Red)    Retraction  Theraband;15 reps    Theraband Level (Shoulder Retraction)  Level 2 (Red)      Shoulder Exercises: ROM/Strengthening   Over Head Lace  2' seated at bodycraft    X to V Arms  10X     Ball on Wall  1' flexion 1' abduction    Other ROM/Strengthening Exercises  Pt passed tennis ball behind back working on IR, 15X      Shoulder Exercises: Stretch   Internal Rotation Stretch  3 reps   20" horizontal towel   External Rotation Stretch  3 reps;20 seconds    Wall Stretch - Flexion  3 reps;20 seconds      Manual Therapy   Manual Therapy  Myofascial release    Manual therapy comments  manual therapy completed seperately from all other interventions    Myofascial Release  myofascial release and manual stretching to left upper arm, scapular and shoulder region to decrease pain and restrictions and improve pain free mobility.               OT Education - 12/17/18 [phone]    Education Details  provided additional pictures of er stretch    Person(s) Educated  Patient    Methods  Explanation;Demonstration;Handout    Comprehension  Verbalized understanding;Returned demonstration       OT  Short Term Goals - 12/09/18 1207  OT SHORT TERM GOAL #1   Title  Patient will be educated on a HEP for left shoulder ROM and strength.    Time  4    Period  Weeks      OT SHORT TERM GOAL #2   Title  Patient will improve left shoulder  P/ROM to [facility] in order to complete B/ADLs with increased independence.    Time  4    Period  Weeks      OT SHORT TERM GOAL #3   Title  Patient will improve left shoulder and strength to 3+/5 for increased ability to pick up baskets of laundry.    Time  4    Period  Weeks    Status  Partially Met      OT SHORT TERM GOAL #4   Title  Patient will decrease left shoulder pain to 4/10 or better in order to complete B/ADLs with greater independence.    Time  4    Period  Weeks    Status  On-going      OT SHORT TERM GOAL #5   Title  Patient will decrease fascial restrictions to moderate amount in left shoulder region for greater mobility needed for daily tasks.     Time  4    Period  Weeks    Status  Achieved        OT Long Term Goals - 12/09/18 1208      OT LONG TERM GOAL #1   Title  Patient will return to prior level of independence with all B/IADLs, work, and leisure activities using her left arm normally.     Time  8    Period  Weeks    Status  On-going      OT LONG TERM GOAL #2   Title  Patient will improve left shoulder A/ROM to WNL for greater ability to use left arm actively work and home.     Time  8    Period  Weeks    Status  On-going      OT LONG TERM GOAL #3   Title  Patient will improve left shoulder and elbow strength to 5/5 for greater ability to lift bags of mulch when completing yardwork.      Time  8    Period  Weeks    Status  On-going      OT LONG TERM GOAL #4   Title  Patient will decrease left shoulder pain to 2/10 or better when completing functional actiivites.     Time  8    Period  Weeks    Status  On-going      OT LONG TERM GOAL #5   Title  Paitent will decrease left fascial restrictions in left shoulder  to trace amount in order to have greater mobility needed for use of left arm with functional activiites.      Time  8    Period  Weeks            Plan - 12/17/18 0957    Clinical Impression Statement  A: Pt reports MD is pleased with her progress and she is going back to work on Monday. Continued with A/ROM today and progressed to strengthening in standing. Added ball on wall for scapular stability and strengthening. Reviewed er stretch and provided pictures of 2 ways to complete. Verbal cuing for form and technique during session. Pt reports she would like to continue therapy through next week.  Plan  P: Continue working on shoulder strengthening, attempt Y lift off       Patient will benefit from skilled therapeutic intervention in order to improve the following deficits and impairments:  Decreased range of motion, Impaired flexibility, Increased muscle spasms, Impaired UE functional use, Pain, Decreased strength  Visit Diagnosis: Stiffness of left shoulder, not elsewhere classified  Acute pain of left shoulder  Other symptoms and signs involving the musculoskeletal system    Problem List Patient Active Problem List   Diagnosis Date Noted  . Postoperative anemia due to acute blood loss 11/24/2018  . Acute cholecystitis 11/20/2018  . Impingement syndrome of left shoulder 09/30/2018  . Unspecified rotator cuff tear or rupture of right shoulder, not specified as traumatic 09/30/2018  . Acromioclavicular joint arthritis 09/30/2018  . Sun-damaged skin 01/03/2018  . Morbid obesity ([place]) 11/05/2017  . Depression with anxiety 11/05/2017  . Nontraumatic incomplete tear of right rotator cuff 09/25/2016  . Foreign body of shoulder right  09/25/2016  . 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 Shoulder joint painful on movement, right 08/29/2016  . Epigastric pain 08/11/2015  . Chest pain, rule out acute myocardial infarction 08/11/2015  . Renal insufficiency  08/11/2015  . Venous stasis 04/13/2015  . Depression 04/13/2015  . Essential hypertension 09/12/2014  . PVCs (premature ventricular contractions) 09/07/2014  . Precordial pain 09/07/2014   Guadelupe Sabin, OTR/L  [phone] 12/17/2018, 9:59 AM  [facility] [address] [place], [place], [address] Phone: [phone]   Fax:  [phone]  Name: THERMA LASURE MRN: [id number] Date of Birth: Oct 02, 1951

## 2018-12-18 ENCOUNTER — Encounter (HOSPITAL_COMMUNITY): Payer: Self-pay | Admitting: Occupational Therapy

## 2018-12-18 ENCOUNTER — Ambulatory Visit (HOSPITAL_COMMUNITY): Payer: 59 | Admitting: Occupational Therapy

## 2018-12-18 DIAGNOSIS — R29898 Other symptoms and signs involving the musculoskeletal system: Secondary | ICD-10-CM

## 2018-12-18 DIAGNOSIS — M25512 Pain in left shoulder: Secondary | ICD-10-CM

## 2018-12-18 DIAGNOSIS — M25612 Stiffness of left shoulder, not elsewhere classified: Secondary | ICD-10-CM | POA: Diagnosis not present

## 2018-12-18 NOTE — Patient Instructions (Signed)
Side Lying Exercises: Complete 10-15X each, 1-2x/day   1) Sidelying Flexion:   Lie on your side with your affected arm up. Start with the weight in your top hand by your side. Lift the arm forward and pull your shoulder blade down as the arm lifts up (like a seesaw). NO WEIGHT     2) Sidelying abduction:   Lie on your side with your arm down straight at your side.  Raise the arm up and overhead, keeping the elbow straight.  You may turn the palm forward and inward if you can.     3) Sidelying horizontal abduction:   Lie on your side with arm straight up towards ceiling. Lower the arm straight out to face the wall and bring back up towards ceiling.     4) Sidelying internal/external rotation:   Lie on side with elbow bent. Lower and raise forearm in direction of the ceiling, keeping elbow bent and by the side.       

## 2018-12-18 NOTE — Therapy (Signed)
[place], [place], [address] Phone: [phone]   Fax:  [phone]  Occupational Therapy Treatment  Patient Details  Name: Jennifer Coleman MRN: [id number] Date of Birth: Jul 04, 1951 Referring Provider (OT): Dr. Joni Fears   Encounter Date: 12/18/2018  OT End of Session - 12/18/18 1203    Visit Number  15    Number of Visits  16    Date for OT Re-Evaluation  12/29/18    Authorization Type  UMR    Authorization Time Period  25 visit limit    Authorization - Visit Number  10    Authorization - Number of Visits  25    OT Start Time  1224    OT Stop Time  1202    OT Time Calculation (min)  40 min    Activity Tolerance  Patient tolerated treatment well    Behavior During Therapy  [facility] for tasks assessed/performed       Past Medical History:  Diagnosis Date  . Anxiety   . Arthritis   . Dyspnea   . Dysrhythmia   . Family history of anesthesia complication 25 yrs ago   Sister allergic to sccinocholine - unable to come off ventilator  . Headache    started last night , pain level  8  . Hypertension   . Pneumonia   . PONV (postoperative nausea and vomiting)   . Premature ventricular contractions   . Shingles   . Varicose veins   . Varicose veins of left lower extremity     Past Surgical History:  Procedure Laterality Date  . CARPAL TUNNEL RELEASE    . CERVICAL DISC SURGERY  1992   Fusion  . CHOLECYSTECTOMY N/A 11/21/2018   Procedure: LAPAROSCOPIC CHOLECYSTECTOMY;  Surgeon: Virl Cagey, MD;  [facility];  Service: General;  Laterality: N/A;  . COLONOSCOPY N/A 06/07/2016   Procedure: COLONOSCOPY;  Surgeon: Rogene Houston, MD;  [facility];  Service: Endoscopy;  Laterality: N/A;  1:00  . KNEE ARTHROSCOPY  09/16/2012   Procedure: ARTHROSCOPY KNEE;  Surgeon: [name], MD;  Location: [place];  Service: Orthopedics;  Laterality: Right;  Right Knee Arthroscopy  .  SALPINGOOPHORECTOMY Left 1985  . SHOULDER ARTHROSCOPY WITH OPEN ROTATOR CUFF REPAIR AND DISTAL CLAVICLE ACROMINECTOMY Right 05/03/2016   Procedure: RIGHT SHOULDER ARTHROSCOPY WITH MINI-OPEN ROTATOR CUFF REPAIR,DISTAL CLAVICLE RESECTION, SUBACROMIAL DECOMPRESSION.;  Surgeon: [name], MD;  Location: [place];  Service: Orthopedics;  Laterality: Right;  . SHOULDER ARTHROSCOPY WITH ROTATOR CUFF REPAIR Right 09/25/2016   Procedure: Right Shoulder Manipulation, Arthroscopic Debridement of Joint, Removal of Loose Body and Re-Repair of Rotator Cuff;  Surgeon: [name], MD;  Location: [place];  Service: Orthopedics;  Laterality: Right;    There were no vitals filed for this visit.  Subjective Assessment - 12/18/18 1124    Subjective   S: I was sore last night and this morning.     Currently in Pain?  Yes    Pain Score  5     Pain Location  Shoulder    Pain Orientation  Left    Pain Descriptors / Indicators  Sore    Pain Type  Acute pain    Pain Radiating Towards  none    Pain Onset  More than a month ago    Pain Frequency  Intermittent    Aggravating Factors   movement, sleeping on it  Pain Relieving Factors  rest, stretching    Effect of Pain on Daily Activities  min/mod effect    Multiple Pain Sites  No         [facility] OT Assessment - 12/18/18 1123      Assessment   Medical Diagnosis  S/P Left RCR, SAD/DCE      Precautions   Precautions  Shoulder    Type of Shoulder Precautions  Progress as tolerated.               OT Treatments/Exercises (OP) - 12/18/18 1125      Exercises   Exercises  Shoulder      Shoulder Exercises: Supine   Protraction  PROM;5 reps    Horizontal ABduction  PROM;5 reps    External Rotation  PROM;5 reps    Internal Rotation  PROM;5 reps    Flexion  PROM;5 reps    ABduction  PROM;5 reps      Shoulder Exercises: Sidelying   External Rotation  Strengthening;10 reps    External Rotation Weight (lbs)  1    Internal  Rotation  Strengthening;10 reps    Internal Rotation Weight (lbs)  1    Flexion  Strengthening;10 reps    Flexion Weight (lbs)  1    ABduction  Strengthening;10 reps    ABduction Weight (lbs)  1    Other Sidelying Exercises  protraction, 12X, 1#    Other Sidelying Exercises  horizontal abduction, 12X, 1#      Shoulder Exercises: Standing   Protraction  Strengthening;12 reps    Protraction Weight (lbs)  1    Horizontal ABduction  Strengthening;12 reps    Horizontal ABduction Weight (lbs)  1    External Rotation  Strengthening;12 reps    External Rotation Weight (lbs)  1    Internal Rotation  Strengthening;12 reps    Internal Rotation Weight (lbs)  1    Flexion  Strengthening;12 reps    Shoulder Flexion Weight (lbs)  1    ABduction  AROM;15 reps    Extension  Theraband;15 reps    Theraband Level (Shoulder Extension)  Level 2 (Red)    Row  Theraband;15 reps    Theraband Level (Shoulder Row)  Level 2 (Red)    Retraction  Theraband;15 reps    Theraband Level (Shoulder Retraction)  Level 2 (Red)      Shoulder Exercises: ROM/Strengthening   UBE (Upper Arm Bike)  level 2 2' forward 2' reverse    Over Head Lace  2' seated at bodycraft    X to V Arms  12X    Proximal Shoulder Strengthening, Seated  15X each 1 rest break    Ball on Wall  1' flexion 1' abduction    Other ROM/Strengthening Exercises  Pt passed tennis ball behind back working on IR, 15X    Other ROM/Strengthening Exercises  standing field goal, A/ROM, 10X      Shoulder Exercises: Stretch   Internal Rotation Stretch  3 reps   20" horizontal towel   External Rotation Stretch  3 reps;20 seconds    Wall Stretch - Flexion  3 reps;20 seconds             OT Education - 12/18/18 1144    Education Details  sidelying A/ROM    Person(s) Educated  Patient    Methods  Explanation;Demonstration;Handout    Comprehension  Verbalized understanding;Returned demonstration       OT Short Term Goals - 12/09/18 1207  OT  SHORT TERM GOAL #1   Title  Patient will be educated on a HEP for left shoulder ROM and strength.    Time  4    Period  Weeks      OT SHORT TERM GOAL #2   Title  Patient will improve left shoulder  P/ROM to [facility] in order to complete B/ADLs with increased independence.    Time  4    Period  Weeks      OT SHORT TERM GOAL #3   Title  Patient will improve left shoulder and strength to 3+/5 for increased ability to pick up baskets of laundry.    Time  4    Period  Weeks    Status  Partially Met      OT SHORT TERM GOAL #4   Title  Patient will decrease left shoulder pain to 4/10 or better in order to complete B/ADLs with greater independence.    Time  4    Period  Weeks    Status  On-going      OT SHORT TERM GOAL #5   Title  Patient will decrease fascial restrictions to moderate amount in left shoulder region for greater mobility needed for daily tasks.     Time  4    Period  Weeks    Status  Achieved        OT Long Term Goals - 12/09/18 1208      OT LONG TERM GOAL #1   Title  Patient will return to prior level of independence with all B/IADLs, work, and leisure activities using her left arm normally.     Time  8    Period  Weeks    Status  On-going      OT LONG TERM GOAL #2   Title  Patient will improve left shoulder A/ROM to WNL for greater ability to use left arm actively work and home.     Time  8    Period  Weeks    Status  On-going      OT LONG TERM GOAL #3   Title  Patient will improve left shoulder and elbow strength to 5/5 for greater ability to lift bags of mulch when completing yardwork.      Time  8    Period  Weeks    Status  On-going      OT LONG TERM GOAL #4   Title  Patient will decrease left shoulder pain to 2/10 or better when completing functional actiivites.     Time  8    Period  Weeks    Status  On-going      OT LONG TERM GOAL #5   Title  Paitent will decrease left fascial restrictions in left shoulder to trace amount in order to have greater  mobility needed for use of left arm with functional activiites.      Time  8    Period  Weeks            Plan - 12/18/18 1203    Clinical Impression Statement  A: Pt reports soreness last night and this morning, improving with stretching. Continued with shoulder strengthening today using 1# weights in sidelying and standing. Added standing field goal and increased repetitions for x to v arms. Verbal cuing to keep back straight and not to compenstate during exercises. Updated HEP for sidelying exercises.     Plan  P: Continue with shoulder strengthening, add Y lift off and red  loop band exercises; follow up on HEP       Patient will benefit from skilled therapeutic intervention in order to improve the following deficits and impairments:  Decreased range of motion, Impaired flexibility, Increased muscle spasms, Impaired UE functional use, Pain, Decreased strength  Visit Diagnosis: Stiffness of left shoulder, not elsewhere classified  Acute pain of left shoulder  Other symptoms and signs involving the musculoskeletal system    Problem List Patient Active Problem List   Diagnosis Date Noted  . Postoperative anemia due to acute blood loss 11/24/2018  . Acute cholecystitis 11/20/2018  . Impingement syndrome of left shoulder 09/30/2018  . Unspecified rotator cuff tear or rupture of right shoulder, not specified as traumatic 09/30/2018  . Acromioclavicular joint arthritis 09/30/2018  . Sun-damaged skin 01/03/2018  . Morbid obesity ([place]) 11/05/2017  . Depression with anxiety 11/05/2017  . Nontraumatic incomplete tear of right rotator cuff 09/25/2016  . Foreign body of shoulder right  09/25/2016  . 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 Shoulder joint painful on movement, right 08/29/2016  . Epigastric pain 08/11/2015  . Chest pain, rule out acute myocardial infarction 08/11/2015  . Renal insufficiency 08/11/2015  . Venous stasis 04/13/2015  . Depression 04/13/2015   . Essential hypertension 09/12/2014  . PVCs (premature ventricular contractions) 09/07/2014  . Precordial pain 09/07/2014   Guadelupe Sabin, OTR/L  [phone] 12/18/2018, 12:06 PM  [facility] [address] [place], [place], [address] Phone: [phone]   Fax:  [phone]  Name: RIAN KOON MRN: [id number] Date of Birth: [birth year]

## 2018-12-23 ENCOUNTER — Encounter (HOSPITAL_COMMUNITY): Payer: Self-pay

## 2018-12-23 ENCOUNTER — Ambulatory Visit (HOSPITAL_COMMUNITY): Payer: 59

## 2018-12-23 DIAGNOSIS — M25612 Stiffness of left shoulder, not elsewhere classified: Secondary | ICD-10-CM

## 2018-12-23 DIAGNOSIS — R29898 Other symptoms and signs involving the musculoskeletal system: Secondary | ICD-10-CM | POA: Diagnosis not present

## 2018-12-23 DIAGNOSIS — M25512 Pain in left shoulder: Secondary | ICD-10-CM

## 2018-12-23 NOTE — Therapy (Signed)
[place], [place], [address] Phone: [phone]   Fax:  [phone]  Occupational Therapy Treatment  Patient Details  Name: Jennifer Coleman MRN: [id number] Date of Birth: 03/02/1951 Referring Provider (OT): Dr. Joni Fears   Encounter Date: 12/23/2018  OT End of Session - 12/23/18 1706    Visit Number  16    Number of Visits  16    Date for OT Re-Evaluation  12/29/18    Authorization Type  UMR    Authorization Time Period  25 visit limit    Authorization - Visit Number  11    Authorization - Number of Visits  25    OT Start Time  7408   pt arrived late   OT Stop Time  1645    OT Time Calculation (min)  32 min    Activity Tolerance  Patient tolerated treatment well    Behavior During Therapy  [facility] for tasks assessed/performed       Past Medical History:  Diagnosis Date  . Anxiety   . Arthritis   . Dyspnea   . Dysrhythmia   . Family history of anesthesia complication 25 yrs ago   Sister allergic to sccinocholine - unable to come off ventilator  . Headache    started last night , pain level  8  . Hypertension   . Pneumonia   . PONV (postoperative nausea and vomiting)   . Premature ventricular contractions   . Shingles   . Varicose veins   . Varicose veins of left lower extremity     Past Surgical History:  Procedure Laterality Date  . CARPAL TUNNEL RELEASE    . CERVICAL DISC SURGERY  1992   Fusion  . CHOLECYSTECTOMY N/A 11/21/2018   Procedure: LAPAROSCOPIC CHOLECYSTECTOMY;  Surgeon: Virl Cagey, MD;  [facility];  Service: General;  Laterality: N/A;  . COLONOSCOPY N/A 06/07/2016   Procedure: COLONOSCOPY;  Surgeon: Rogene Houston, MD;  [facility];  Service: Endoscopy;  Laterality: N/A;  1:00  . KNEE ARTHROSCOPY  09/16/2012   Procedure: ARTHROSCOPY KNEE;  Surgeon: [name], MD;  Location: [place];  Service: Orthopedics;  Laterality: Right;  Right Knee Arthroscopy   . SALPINGOOPHORECTOMY Left 1985  . SHOULDER ARTHROSCOPY WITH OPEN ROTATOR CUFF REPAIR AND DISTAL CLAVICLE ACROMINECTOMY Right 05/03/2016   Procedure: RIGHT SHOULDER ARTHROSCOPY WITH MINI-OPEN ROTATOR CUFF REPAIR,DISTAL CLAVICLE RESECTION, SUBACROMIAL DECOMPRESSION.;  Surgeon: [name], MD;  Location: [place];  Service: Orthopedics;  Laterality: Right;  . SHOULDER ARTHROSCOPY WITH ROTATOR CUFF REPAIR Right 09/25/2016   Procedure: Right Shoulder Manipulation, Arthroscopic Debridement of Joint, Removal of Loose Body and Re-Repair of Rotator Cuff;  Surgeon: [name], MD;  Location: [place];  Service: Orthopedics;  Laterality: Right;    There were no vitals filed for this visit.  Subjective Assessment - 12/23/18 1653    Subjective   S: The pain goes up to 6 and 7/10 with movement.    Currently in Pain?  Yes    Pain Score  4     Pain Location  Shoulder    Pain Orientation  Left    Pain Descriptors / Indicators  Sore    Pain Type  Acute pain         [facility] OT Assessment - 12/23/18 1620      Assessment   Medical Diagnosis  S/P Left RCR, SAD/DCE      Precautions  Precautions  Shoulder    Type of Shoulder Precautions  Progress as tolerated.               OT Treatments/Exercises (OP) - 12/23/18 1620      Exercises   Exercises  Shoulder      Shoulder Exercises: Supine   Protraction  PROM;5 reps    Horizontal ABduction  PROM;5 reps    External Rotation  PROM;5 reps    Internal Rotation  PROM;5 reps    Flexion  PROM;5 reps    ABduction  PROM;5 reps      Shoulder Exercises: Sidelying   External Rotation  Strengthening;15 reps    External Rotation Weight (lbs)  1    Internal Rotation  Strengthening;15 reps    Internal Rotation Weight (lbs)  1    Flexion  Strengthening;15 reps    Flexion Weight (lbs)  1    ABduction  Strengthening;15 reps    ABduction Weight (lbs)  1    Other Sidelying Exercises  horizontal abduction, 15X, 1#       Shoulder Exercises: Standing   Horizontal ABduction  Strengthening;15 reps    Horizontal ABduction Weight (lbs)  1    Flexion  Strengthening;15 reps    Shoulder Flexion Weight (lbs)  1      Shoulder Exercises: ROM/Strengthening   Proximal Shoulder Strengthening, Seated  15X each with 1#. Rest break after each one.     Other ROM/Strengthening Exercises  Using bodycrafy pulley system at height of Vacuum, patient mimiced motion of task for 1' with 10#      Shoulder Exercises: Stretch   Internal Rotation Stretch  1 rep   30 seconds. towel vertical     Functional Reaching Activities   High Level  Recreated task lowering glass dish down from top shelf at home. pt with 2# wrist weight on left wrist. Lowered 5 cones down from the top shelf 2X.              OT Education - 12/23/18 1706    Education Details  discussed using tennis ball to complete trigger point release at [facility] joint (anterior), complete internal rotation with towel held either vertical or diagonal, and mimic vacuuming motion using red theraband at home.     Person(s) Educated  Patient    Methods  Explanation;Demonstration;Verbal cues    Comprehension  Verbalized understanding;Returned demonstration       OT Short Term Goals - 12/09/18 1207      OT SHORT TERM GOAL #1   Title  Patient will be educated on a HEP for left shoulder ROM and strength.    Time  4    Period  Weeks      OT SHORT TERM GOAL #2   Title  Patient will improve left shoulder  P/ROM to [facility] in order to complete B/ADLs with increased independence.    Time  4    Period  Weeks      OT SHORT TERM GOAL #3   Title  Patient will improve left shoulder and strength to 3+/5 for increased ability to pick up baskets of laundry.    Time  4    Period  Weeks    Status  Partially Met      OT SHORT TERM GOAL #4   Title  Patient will decrease left shoulder pain to 4/10 or better in order to complete B/ADLs with greater independence.    Time  4    Period  Weeks  Status  On-going      OT SHORT TERM GOAL #5   Title  Patient will decrease fascial restrictions to moderate amount in left shoulder region for greater mobility needed for daily tasks.     Time  4    Period  Weeks    Status  Achieved        OT Long Term Goals - 12/09/18 1208      OT LONG TERM GOAL #1   Title  Patient will return to prior level of independence with all B/IADLs, work, and leisure activities using her left arm normally.     Time  8    Period  Weeks    Status  On-going      OT LONG TERM GOAL #2   Title  Patient will improve left shoulder A/ROM to WNL for greater ability to use left arm actively work and home.     Time  8    Period  Weeks    Status  On-going      OT LONG TERM GOAL #3   Title  Patient will improve left shoulder and elbow strength to 5/5 for greater ability to lift bags of mulch when completing yardwork.      Time  8    Period  Weeks    Status  On-going      OT LONG TERM GOAL #4   Title  Patient will decrease left shoulder pain to 2/10 or better when completing functional actiivites.     Time  8    Period  Weeks    Status  On-going      OT LONG TERM GOAL #5   Title  Paitent will decrease left fascial restrictions in left shoulder to trace amount in order to have greater mobility needed for use of left arm with functional activiites.      Time  8    Period  Weeks            Plan - 12/23/18 1707    Clinical Impression Statement  A: Focused on daily tasks that patient reports she continues to have difficulty with at home and recreated them as closely as possible in the clinic setting. Therapist provided education and recommendations for listed tasks and how to practice them independently.     Plan  P: Reassessment and discharge.     Consulted and Agree with Plan of Care  Patient       Patient will benefit from skilled therapeutic intervention in order to improve the following deficits and impairments:  Decreased range of motion,  Impaired flexibility, Increased muscle spasms, Impaired UE functional use, Pain, Decreased strength  Visit Diagnosis: Other symptoms and signs involving the musculoskeletal system  Acute pain of left shoulder  Stiffness of left shoulder, not elsewhere classified    Problem List Patient Active Problem List   Diagnosis Date Noted  . Postoperative anemia due to acute blood loss 11/24/2018  . Acute cholecystitis 11/20/2018  . Impingement syndrome of left shoulder 09/30/2018  . Unspecified rotator cuff tear or rupture of right shoulder, not specified as traumatic 09/30/2018  . Acromioclavicular joint arthritis 09/30/2018  . Sun-damaged skin 01/03/2018  . Morbid obesity ([place]) 11/05/2017  . Depression with anxiety 11/05/2017  . Nontraumatic incomplete tear of right rotator cuff 09/25/2016  . Foreign body of shoulder right  09/25/2016  . 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 Shoulder joint painful on movement, right 08/29/2016  . Epigastric pain 08/11/2015  .  Chest pain, rule out acute myocardial infarction 08/11/2015  . Renal insufficiency 08/11/2015  . Venous stasis 04/13/2015  . Depression 04/13/2015  . Essential hypertension 09/12/2014  . PVCs (premature ventricular contractions) 09/07/2014  . Precordial pain 09/07/2014   Ailene Ravel, OTR/L,CBIS  [phone]  12/23/2018, 5:10 PM  [facility] [address] [place], [place], [address] Phone: [phone]   Fax:  [phone]  Name: CHIMAMANDA SIEGFRIED MRN: [id number] Date of Birth: 05-07-1951

## 2018-12-25 ENCOUNTER — Ambulatory Visit (HOSPITAL_COMMUNITY): Payer: 59 | Admitting: Occupational Therapy

## 2018-12-25 DIAGNOSIS — R29898 Other symptoms and signs involving the musculoskeletal system: Secondary | ICD-10-CM | POA: Diagnosis not present

## 2018-12-25 DIAGNOSIS — M25612 Stiffness of left shoulder, not elsewhere classified: Secondary | ICD-10-CM

## 2018-12-25 DIAGNOSIS — M25512 Pain in left shoulder: Secondary | ICD-10-CM | POA: Diagnosis not present

## 2018-12-25 NOTE — Therapy (Signed)
[place], [place], [address] Phone: [phone]   Fax:  [phone]  Occupational Therapy Reassessment, Treatment, and Discharge Summary  Patient Details  Name: Jennifer Coleman MRN: [id number] Date of Birth: August 13, 1951 Referring Provider (OT): Dr. Joni Fears   Encounter Date: 12/25/2018  OT End of Session - 12/25/18 1642    Visit Number  17    Number of Visits  17    Date for OT Re-Evaluation  12/29/18    Authorization Type  UMR    Authorization Time Period  25 visit limit    Authorization - Visit Number  12    Authorization - Number of Visits  25    OT Start Time  1962    OT Stop Time  1641    OT Time Calculation (min)  29 min    Activity Tolerance  Patient tolerated treatment well    Behavior During Therapy  [facility] for tasks assessed/performed       Past Medical History:  Diagnosis Date  . Anxiety   . Arthritis   . Dyspnea   . Dysrhythmia   . Family history of anesthesia complication 25 yrs ago   Sister allergic to sccinocholine - unable to come off ventilator  . Headache    started last night , pain level  8  . Hypertension   . Pneumonia   . PONV (postoperative nausea and vomiting)   . Premature ventricular contractions   . Shingles   . Varicose veins   . Varicose veins of left lower extremity     Past Surgical History:  Procedure Laterality Date  . CARPAL TUNNEL RELEASE    . CERVICAL DISC SURGERY  1992   Fusion  . CHOLECYSTECTOMY N/A 11/21/2018   Procedure: LAPAROSCOPIC CHOLECYSTECTOMY;  Surgeon: Virl Cagey, MD;  [facility];  Service: General;  Laterality: N/A;  . COLONOSCOPY N/A 06/07/2016   Procedure: COLONOSCOPY;  Surgeon: Rogene Houston, MD;  [facility];  Service: Endoscopy;  Laterality: N/A;  1:00  . KNEE ARTHROSCOPY  09/16/2012   Procedure: ARTHROSCOPY KNEE;  Surgeon: [name], MD;  Location: [place];  Service: Orthopedics;  Laterality: Right;  Right  Knee Arthroscopy  . SALPINGOOPHORECTOMY Left 1985  . SHOULDER ARTHROSCOPY WITH OPEN ROTATOR CUFF REPAIR AND DISTAL CLAVICLE ACROMINECTOMY Right 05/03/2016   Procedure: RIGHT SHOULDER ARTHROSCOPY WITH MINI-OPEN ROTATOR CUFF REPAIR,DISTAL CLAVICLE RESECTION, SUBACROMIAL DECOMPRESSION.;  Surgeon: [name], MD;  Location: [place];  Service: Orthopedics;  Laterality: Right;  . SHOULDER ARTHROSCOPY WITH ROTATOR CUFF REPAIR Right 09/25/2016   Procedure: Right Shoulder Manipulation, Arthroscopic Debridement of Joint, Removal of Loose Body and Re-Repair of Rotator Cuff;  Surgeon: [name], MD;  Location: [place];  Service: Orthopedics;  Laterality: Right;    There were no vitals filed for this visit.  Subjective Assessment - 12/25/18 1653    Subjective   S: I just can't get rid of this pain.     Currently in Pain?  Yes    Pain Score  4     Pain Location  Shoulder    Pain Orientation  Left    Pain Descriptors / Indicators  Aching;Sore    Pain Type  Acute pain    Pain Radiating Towards  none    Pain Onset  More than a month ago    Pain Frequency  Intermittent    Aggravating Factors   movement  Pain Relieving Factors  rest    Effect of Pain on Daily Activities  min effect    Multiple Pain Sites  No         [facility] OT Assessment - 12/25/18 1613      Assessment   Medical Diagnosis  S/P Left RCR, SAD/DCE      Precautions   Precautions  Shoulder    Type of Shoulder Precautions  Progress as tolerated.      AROM   Overall AROM Comments  Assessed seated, er/IR adducted    AROM Assessment Site  Shoulder    Right/Left Shoulder  Left    Left Shoulder Flexion  134 Degrees   130 previous   Left Shoulder ABduction  140 Degrees   130 previous   Left Shoulder Internal Rotation  90 Degrees   same as previous   Left Shoulder External Rotation  60 Degrees   57 previous     PROM   PROM Assessment Site  Shoulder    Right/Left Shoulder  Left    Left Shoulder  Flexion  160 Degrees   125 previous   Left Shoulder ABduction  168 Degrees   105 previous   Left Shoulder Internal Rotation  90 Degrees   same as previous   Left Shoulder External Rotation  68 Degrees   50 previous     Strength   Overall Strength Comments  Assessed seated. IR/er adducted. Strength assessed for the first time this session.     Strength Assessment Site  Shoulder    Right/Left Shoulder  Left    Left Shoulder Flexion  3/5   same as previous   Left Shoulder ABduction  3/5   same as previous   Left Shoulder Internal Rotation  4-/5   same as previous   Left Shoulder External Rotation  4-/5   same as previous              OT Treatments/Exercises (OP) - 12/25/18 1627      Exercises   Exercises  Shoulder      Shoulder Exercises: Standing   Protraction  Theraband;10 reps    Theraband Level (Shoulder Protraction)  Level 2 (Red)    Horizontal ABduction  Theraband;10 reps    Theraband Level (Shoulder Horizontal ABduction)  Level 2 (Red)    External Rotation  Theraband;10 reps    Theraband Level (Shoulder External Rotation)  Level 2 (Red)    Internal Rotation  Theraband;10 reps    Theraband Level (Shoulder Internal Rotation)  Level 2 (Red)    Flexion  Theraband;10 reps    Theraband Level (Shoulder Flexion)  Level 2 (Red)    ABduction  Theraband;10 reps    Theraband Level (Shoulder ABduction)  Level 2 (Red)      Shoulder Exercises: ROM/Strengthening   Over Head Lace  2' seated at bodycraft               OT Short Term Goals - 12/25/18 1642      OT SHORT TERM GOAL #1   Title  Patient will be educated on a HEP for left shoulder ROM and strength.    Time  4    Period  Weeks      OT SHORT TERM GOAL #2   Title  Patient will improve left shoulder  P/ROM to [facility] in order to complete B/ADLs with increased independence.    Time  4    Period  Weeks      OT SHORT  TERM GOAL #3   Title  Patient will improve left shoulder and strength to 3+/5 for  increased ability to pick up baskets of laundry.    Time  4    Period  Weeks    Status  Partially Met      OT SHORT TERM GOAL #4   Title  Patient will decrease left shoulder pain to 4/10 or better in order to complete B/ADLs with greater independence.    Time  4    Period  Weeks    Status  Not Met      OT SHORT TERM GOAL #5   Title  Patient will decrease fascial restrictions to moderate amount in left shoulder region for greater mobility needed for daily tasks.     Time  4    Period  Weeks    Status  Achieved        OT Long Term Goals - 12/25/18 1642      OT LONG TERM GOAL #1   Title  Patient will return to prior level of independence with all B/IADLs, work, and leisure activities using her left arm normally.     Time  8    Period  Weeks    Status  Not Met      OT LONG TERM GOAL #2   Title  Patient will improve left shoulder A/ROM to WNL for greater ability to use left arm actively work and home.     Time  8    Period  Weeks    Status  Partially Met      OT LONG TERM GOAL #3   Title  Patient will improve left shoulder and elbow strength to 5/5 for greater ability to lift bags of mulch when completing yardwork.      Time  8    Period  Weeks    Status  Not Met      OT LONG TERM GOAL #4   Title  Patient will decrease left shoulder pain to 2/10 or better when completing functional actiivites.     Time  8    Period  Weeks    Status  Not Met      OT LONG TERM GOAL #5   Title  Paitent will decrease left fascial restrictions in left shoulder to trace amount in order to have greater mobility needed for use of left arm with functional activiites.      Time  8    Period  Weeks            Plan - 12/25/18 1642    Clinical Impression Statement  A: Reassessment completed this session, pt has met 3/5 STGs with an additional goal partially met, and has met 1/5 LTGs and partially met 1 LTG.  Pt has functional ROM allowing her to use LUE for reaching tasks, IR behind back  continues to be limited and cause increased pain. Pt reports continued pain in anterior and medial deltoid regions, as well as strength deficits limiting ability to perform lifting tasks and perform some household chores. Discussed progress and continued limitations with pt, providing option of referred to PT certified in dry needling. Pt is agreeable to discharge from OT services and refer to dry needling.     Plan  P: Discharge pt       Patient will benefit from skilled therapeutic intervention in order to improve the following deficits and impairments:  Decreased range of motion, Impaired flexibility, Increased muscle spasms, Impaired UE  functional use, Pain, Decreased strength  Visit Diagnosis: Other symptoms and signs involving the musculoskeletal system  Acute pain of left shoulder  Stiffness of left shoulder, not elsewhere classified    Problem List Patient Active Problem List   Diagnosis Date Noted  . Postoperative anemia due to acute blood loss 11/24/2018  . Acute cholecystitis 11/20/2018  . Impingement syndrome of left shoulder 09/30/2018  . Unspecified rotator cuff tear or rupture of right shoulder, not specified as traumatic 09/30/2018  . Acromioclavicular joint arthritis 09/30/2018  . Sun-damaged skin 01/03/2018  . Morbid obesity ([place]) 11/05/2017  . Depression with anxiety 11/05/2017  . Nontraumatic incomplete tear of right rotator cuff 09/25/2016  . Foreign body of shoulder right  09/25/2016  . 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 Shoulder joint painful on movement, right 08/29/2016  . Epigastric pain 08/11/2015  . Chest pain, rule out acute myocardial infarction 08/11/2015  . Renal insufficiency 08/11/2015  . Venous stasis 04/13/2015  . Depression 04/13/2015  . Essential hypertension 09/12/2014  . PVCs (premature ventricular contractions) 09/07/2014  . Precordial pain 09/07/2014   Guadelupe Sabin, OTR/L  [phone] 12/25/2018, 4:54 PM  [facility] [address] [place], [place], [address] Phone: [phone]   Fax:  [phone]  Name: Jennifer Coleman MRN: [id number] Date of Birth: 03/30/1951   OCCUPATIONAL THERAPY DISCHARGE SUMMARY  Visits from Start of Care: 17  Current functional level related to goals / functional outcomes: See above. Pt is completing ADLs independently, use LUE as non-dominant.    Remaining deficits: Pt continues to have pain limiting ability to lift weighted objects. Continues to have strength deficits, no progress since last reassessment.    Education / Equipment: HEPs for ROM, strength, IR stretching, and self-myofascial release with tennis ball Plan: Patient agrees to discharge.  Patient goals were partially met. Patient is being discharged due to lack of progress.  ?????

## 2018-12-30 ENCOUNTER — Ambulatory Visit (HOSPITAL_COMMUNITY): Payer: 59 | Attending: Orthopaedic Surgery

## 2018-12-30 ENCOUNTER — Encounter (HOSPITAL_COMMUNITY): Payer: Self-pay

## 2018-12-30 ENCOUNTER — Other Ambulatory Visit: Payer: Self-pay

## 2018-12-30 DIAGNOSIS — M62838 Other muscle spasm: Secondary | ICD-10-CM

## 2018-12-30 DIAGNOSIS — M25512 Pain in left shoulder: Secondary | ICD-10-CM | POA: Insufficient documentation

## 2018-12-30 DIAGNOSIS — R29898 Other symptoms and signs involving the musculoskeletal system: Secondary | ICD-10-CM | POA: Diagnosis not present

## 2018-12-30 DIAGNOSIS — M25612 Stiffness of left shoulder, not elsewhere classified: Secondary | ICD-10-CM | POA: Diagnosis not present

## 2018-12-30 DIAGNOSIS — G8929 Other chronic pain: Secondary | ICD-10-CM | POA: Diagnosis not present

## 2018-12-30 NOTE — Therapy (Addendum)
[place] [facility] [address] [place], [place], [address] Phone: [phone]   Fax:  [phone]  Physical Therapy Evaluation  Patient Details  Name: Jennifer Coleman MRN: [id number] Date of Birth: 1950/12/30 Referring Provider (PT): Ralene Ok, MD   Encounter Date: 12/30/2018  PT End of Session - 12/30/18 1740    Visit Number  1    Number of Visits  8    Date for PT Re-Evaluation  01/27/19    Authorization Type  [facility]    Authorization Time Period  12/30/18 to 01/27/19    Authorization - Visit Number  18  17 visits with OT   Authorization - Number of Visits  25    PT Start Time  1443   pt arrived late   PT Stop Time  1520    PT Time Calculation (min)  37 min    Activity Tolerance  Patient tolerated treatment well    Behavior During Therapy  [facility] for tasks assessed/performed       Past Medical History:  Diagnosis Date  . Anxiety   . Arthritis   . Dyspnea   . Dysrhythmia   . Family history of anesthesia complication 25 yrs ago   Sister allergic to sccinocholine - unable to come off ventilator  . Headache    started last night , pain level  8  . Hypertension   . Pneumonia   . PONV (postoperative nausea and vomiting)   . Premature ventricular contractions   . Shingles   . Varicose veins   . Varicose veins of left lower extremity     Past Surgical History:  Procedure Laterality Date  . CARPAL TUNNEL RELEASE    . CERVICAL DISC SURGERY  1992   Fusion  . CHOLECYSTECTOMY N/A 11/21/2018   Procedure: LAPAROSCOPIC CHOLECYSTECTOMY;  Surgeon: Lucretia Roers, MD;  Location: [facility];  Service: General;  Laterality: N/A;  . COLONOSCOPY N/A 06/07/2016   Procedure: COLONOSCOPY;  Surgeon: Malissa Hippo, MD;  [facility];  Service: Endoscopy;  Laterality: N/A;  1:00  . KNEE ARTHROSCOPY  09/16/2012   Procedure: ARTHROSCOPY KNEE;  Surgeon: Valeria Batman, MD;  Location: [facility] OR;  Service: Orthopedics;  Laterality:  Right;  Right Knee Arthroscopy  . SALPINGOOPHORECTOMY Left 1985  . SHOULDER ARTHROSCOPY WITH OPEN ROTATOR CUFF REPAIR AND DISTAL CLAVICLE ACROMINECTOMY Right 05/03/2016   Procedure: RIGHT SHOULDER ARTHROSCOPY WITH MINI-OPEN ROTATOR CUFF REPAIR,DISTAL CLAVICLE RESECTION, SUBACROMIAL DECOMPRESSION.;  Surgeon: Valeria Batman, MD;  Location: [facility];  Service: Orthopedics;  Laterality: Right;  . SHOULDER ARTHROSCOPY WITH ROTATOR CUFF REPAIR Right 09/25/2016   Procedure: Right Shoulder Manipulation, Arthroscopic Debridement of Joint, Removal of Loose Body and Re-Repair of Rotator Cuff;  Surgeon: Valeria Batman, MD;  [facility] OR;  Service: Orthopedics;  Laterality: Right;    There were no vitals filed for this visit.   Subjective Assessment - 12/30/18 1446    Subjective  Pt states that she underwent L RCR on 09/30/18 by Dr. Cleophas Dunker. She underwent OT at this clinic and was d/c'd last week from those services. She states that she is still having pain at her anterior shoulder that just wouldn't go away. She states that sometimes when she is doing her exercises, her hand can go numb/tingle. She states that she is having the most difficulty doing anything in front of her, she will feel that muscle engage and it is painful. Any time she  has to sweep back (hand behind back, etc.) she has pain and difficulty doing it. Her pain is a dull ache at rest and it is sharp pain when she tries to do hand behind back.     Limitations  Lifting    Patient Stated Goals  for the mm to quit being tight and achy; for me to be able to have full rotation and movement back    Currently in Pain?  Yes    Pain Score  4     Pain Location  Shoulder    Pain Orientation  Left    Pain Descriptors / Indicators  Aching;Dull    Pain Type  Chronic pain    Pain Onset  More than a month ago    Pain Frequency  Intermittent    Aggravating Factors   movement    Pain Relieving Factors  rest    Effect of Pain on  Daily Activities  min effect         [facility] PT Assessment - 12/30/18 0001      Assessment   Medical Diagnosis  L shoulder pain s/p L RCR - dry needling    Referring Provider (PT)  Ralene Ok, MD    Onset Date/Surgical Date  09/30/18    Hand Dominance  Right    Next MD Visit  in about 6 weeks    Prior Therapy  OT for L RCR      Precautions   Precautions  Shoulder    Type of Shoulder Precautions  Progress as tolerated.      Balance Screen   Has the patient fallen in the past 6 months  No    Has the patient had a decrease in activity level because of a fear of falling?   No    Is the patient reluctant to leave their home because of a fear of falling?   No      Observation/Other Assessments   Focus on Therapeutic Outcomes (FOTO)   43% limitation    Other Surveys   Quick Dash    Quick DASH   45.45/100   higher scores = more disability     ROM / Strength   AROM / PROM / Strength  AROM;Strength      AROM   Overall AROM Comments  in standing    AROM Assessment Site  Shoulder    Right/Left Shoulder  Left    Left Shoulder Flexion  146 Degrees    Left Shoulder ABduction  147 Degrees    Left Shoulder Internal Rotation  30 Degrees   in prone   Left Shoulder External Rotation  47 Degrees   in prone     Strength   Strength Assessment Site  Shoulder;Elbow    Left Shoulder Flexion  3+/5    Left Shoulder ABduction  3/5    Left Shoulder Internal Rotation  4-/5    Left Shoulder External Rotation  3+/5    Left Elbow Flexion  4+/5    Left Elbow Extension  4+/5      Palpation   Palpation comment  mod - max palpable knots and trigger points throughout anterior and middle deltoid, pec major, lats, infraspinatus, and teres minor           Objective measurements completed on examination: See above findings.       PT Education - 12/30/18 1740    Education Details  exam findings, POC, risks and benefits of trigger point dry needling  Person(s) Educated  Patient     Methods  Explanation    Comprehension  Verbalized understanding       PT Short Term Goals - 12/30/18 1802      PT SHORT TERM GOAL #1   Title  Pt will have improved L GH AROM to 160deg for flexion and abd, and improved IR to 45deg and ER to 60deg in order to demo reduced soft tissue restrictions.    Time  2    Period  Weeks    Status  New    Target Date  01/13/19      PT SHORT TERM GOAL #2   Title  Pt will have improved MMT by 1/2 grade throughout in order to reduce pain and improved functional tasks at home.    Time  2    Period  Weeks    Status  New      PT SHORT TERM GOAL #3   Title  Pt will have reduced QuickDASH score by 10 points or > to demo reduced dysfunction due to pt?Ts L shoulder pain.     Time  2    Period  Weeks    Status  New        PT Long Term Goals - 12/30/18 1814      PT LONG TERM GOAL #1   Title  Pt will have improved L GH IR to 60deg and ER to 75deg (in prone) in order to further demo reduced soft tissue restrictions and allow her to perform self-grooming tasks with greater ease. 4    Time  4    Period  Weeks    Status  New    Target Date  01/27/19      PT LONG TERM GOAL #2   Title  Pt will have improved MMT by 1 grade throughout in order to further reduce pain and further maximize functional tasks at home and at work.     Time  4    Period  Weeks    Status  New      PT LONG TERM GOAL #3   Title  Pt will report reduced overall pain to 3/10 or < at max to demo reduced soft tissue restrictions and improved overall function.    Time  4    Period  Weeks    Status  New             Plan - 12/30/18 1748    Clinical Impression Statement  Pt is pleasant 67YO F who presents to OPPT with c/o chronic L shoulder pain s/p RCR on 09/30/18 by Dr. Norlene Campbell. Pt presents with deficits in L GH AROM, functional AROM (per gross assessment), strength, soft tissue restrictions throughout RTC mm, pecs, and deltoid, with palpable restrictions that recreated  her same pain. Pt needs skilled PT intervention to address these impairments in order to reduce her pain, improve ROM, and maximize overall functional use of her LUE.     Stability/Clinical Decision Making  Stable/Uncomplicated    Clinical Decision Making  Low    Rehab Potential  Good    PT Frequency  2x / week    PT Duration  4 weeks    PT Treatment/Interventions  ADLs/Self Care Home Management;Aquatic Therapy;Cryotherapy;Electrical Stimulation;Moist Heat;Traction;Ultrasound;DME Instruction;Functional mobility training;Therapeutic activities;Therapeutic exercise;Neuromuscular re-education;Patient/family education;Manual techniques;Scar mobilization;Passive range of motion;Dry needling;Energy conservation;Taping;Joint Manipulations;Spinal Manipulations    PT Next Visit Plan  review goals, initiate dry needling, manual for STM/joint mobs/PROM for improved L shoulder  pain/mobility, potentially PNF stretching, joint stretching in general, and therex for strengthening    PT Home Exercise Plan  eval: continue OT HEP    Consulted and Agree with Plan of Care  Patient       Patient will benefit from skilled therapeutic intervention in order to improve the following deficits and impairments:  Decreased endurance, Decreased range of motion, Decreased strength, Hypomobility, Increased fascial restricitons, Increased muscle spasms, Impaired flexibility, Impaired UE functional use, Improper body mechanics, Postural dysfunction  Visit Diagnosis: Chronic left shoulder pain - Plan: PT plan of care cert/re-cert  Stiffness of left shoulder, not elsewhere classified - Plan: PT plan of care cert/re-cert  Other symptoms and signs involving the musculoskeletal system - Plan: PT plan of care cert/re-cert  Other muscle spasm - Plan: PT plan of care cert/re-cert     Problem List Patient Active Problem List   Diagnosis Date Noted  . Postoperative anemia due to acute blood loss 11/24/2018  . Acute cholecystitis  11/20/2018  . Impingement syndrome of left shoulder 09/30/2018  . Unspecified rotator cuff tear or rupture of right shoulder, not specified as traumatic 09/30/2018  . Acromioclavicular joint arthritis 09/30/2018  . Sun-damaged skin 01/03/2018  . Morbid obesity (HCC) 11/05/2017  . Depression with anxiety 11/05/2017  . Nontraumatic incomplete tear of right rotator cuff 09/25/2016  . Foreign body of shoulder right  09/25/2016  . 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 Shoulder joint painful on movement, right 08/29/2016  . Epigastric pain 08/11/2015  . Chest pain, rule out acute myocardial infarction 08/11/2015  . Renal insufficiency 08/11/2015  . Venous stasis 04/13/2015  . Depression 04/13/2015  . Essential hypertension 09/12/2014  . PVCs (premature ventricular contractions) 09/07/2014  . Precordial pain 09/07/2014        Jac Canavan PT, DPT  [facility] [address] [place], [place], [address] Phone: [phone]   Fax:  [phone]  Name: Jennifer Coleman MRN: [id number] Date of Birth: 1951/09/15

## 2019-01-05 DIAGNOSIS — M7740 Metatarsalgia, unspecified foot: Secondary | ICD-10-CM | POA: Diagnosis not present

## 2019-01-05 DIAGNOSIS — M7752 Other enthesopathy of left foot: Secondary | ICD-10-CM | POA: Diagnosis not present

## 2019-01-05 DIAGNOSIS — M79675 Pain in left toe(s): Secondary | ICD-10-CM | POA: Diagnosis not present

## 2019-01-07 ENCOUNTER — Ambulatory Visit (HOSPITAL_COMMUNITY): Payer: 59

## 2019-01-07 ENCOUNTER — Other Ambulatory Visit: Payer: Self-pay

## 2019-01-07 ENCOUNTER — Encounter (HOSPITAL_COMMUNITY): Payer: Self-pay

## 2019-01-07 DIAGNOSIS — R29898 Other symptoms and signs involving the musculoskeletal system: Secondary | ICD-10-CM | POA: Diagnosis not present

## 2019-01-07 DIAGNOSIS — M25612 Stiffness of left shoulder, not elsewhere classified: Secondary | ICD-10-CM | POA: Diagnosis not present

## 2019-01-07 DIAGNOSIS — G8929 Other chronic pain: Secondary | ICD-10-CM

## 2019-01-07 DIAGNOSIS — M62838 Other muscle spasm: Secondary | ICD-10-CM

## 2019-01-07 DIAGNOSIS — M25512 Pain in left shoulder: Principal | ICD-10-CM

## 2019-01-07 NOTE — Therapy (Signed)
[place], [place], [address] Phone: [phone]   Fax:  [phone]  Physical Therapy Treatment  Patient Details  Name: Jennifer Coleman MRN: [id number] Date of Birth: [birth year] Referring Provider (PT): Clarene Reamer, MD   Encounter Date: 01/07/2019  PT End of Session - 01/07/19 1348    Visit Number  2    Number of Visits  8    Date for PT Re-Evaluation  01/27/19    Authorization Type  [facility]    Authorization Time Period  12/30/18 to 01/27/19    Authorization - Visit Number  16   17 visits with OT   Authorization - Number of Visits  25    PT Start Time  6294    PT Stop Time  1426    PT Time Calculation (min)  39 min    Activity Tolerance  Patient tolerated treatment well    Behavior During Therapy  [facility] for tasks assessed/performed       Past Medical History:  Diagnosis Date   Anxiety    Arthritis    Dyspnea    Dysrhythmia    Family history of anesthesia complication 25 yrs ago   Sister allergic to sccinocholine - unable to come off ventilator   Headache    started last night , pain level  8   Hypertension    Pneumonia    PONV (postoperative nausea and vomiting)    Premature ventricular contractions    Shingles    Varicose veins    Varicose veins of left lower extremity     Past Surgical History:  Procedure Laterality Date   [place]   Fusion   CHOLECYSTECTOMY N/A 11/21/2018   Procedure: LAPAROSCOPIC CHOLECYSTECTOMY;  Surgeon: Virl Cagey, MD;  Location: [facility];  Service: General;  Laterality: N/A;   COLONOSCOPY N/A 06/07/2016   Procedure: COLONOSCOPY;  Surgeon: Rogene Houston, MD;  [facility];  Service: Endoscopy;  Laterality: N/A;  1:00   KNEE ARTHROSCOPY  09/16/2012   Procedure: ARTHROSCOPY KNEE;  Surgeon: [name], MD;  Location: [place];  Service: Orthopedics;  Laterality: Right;  Right Knee  Arthroscopy   SALPINGOOPHORECTOMY Left 1985   SHOULDER ARTHROSCOPY WITH OPEN ROTATOR CUFF REPAIR AND DISTAL CLAVICLE ACROMINECTOMY Right 05/03/2016   Procedure: RIGHT SHOULDER ARTHROSCOPY WITH MINI-OPEN ROTATOR CUFF REPAIR,DISTAL CLAVICLE RESECTION, SUBACROMIAL DECOMPRESSION.;  Surgeon: [name], MD;  Location: [place];  Service: Orthopedics;  Laterality: Right;   SHOULDER ARTHROSCOPY WITH ROTATOR CUFF REPAIR Right 09/25/2016   Procedure: Right Shoulder Manipulation, Arthroscopic Debridement of Joint, Removal of Loose Body and Re-Repair of Rotator Cuff;  Surgeon: [name], MD;  Location: [place];  Service: Orthopedics;  Laterality: Right;    There were no vitals filed for this visit.  Subjective Assessment - 01/07/19 1349    Subjective  Pt states that her shoulder is still bothering her. Still about a 4-5/10 at the anterior shoulder.    Limitations  Lifting    Patient Stated Goals  for the mm to quit being tight and achy; for me to be able to have full rotation and movement back    Currently in Pain?  Yes    Pain Score  5     Pain Location  Shoulder    Pain Orientation  Left    Pain Descriptors / Indicators  Aching;Dull  Pain Type  Chronic pain    Pain Onset  More than a month ago    Pain Frequency  Intermittent    Aggravating Factors   movement    Pain Relieving Factors  rest    Effect of Pain on Daily Activities  min effect               [facility] Adult PT Treatment/Exercise - 01/07/19 0001      Exercises   Exercises  Shoulder      Shoulder Exercises: Standing   Other Standing Exercises  fwd flex and abd RTB 2x10      Shoulder Exercises: Stretch   Other Shoulder Stretches  L anterior delt stretch in doorway 3x30"      Modalities   Modalities  Moist Heat      Moist Heat Therapy   Number Minutes Moist Heat  5 Minutes    Moist Heat Location  Shoulder      Manual Therapy   Manual Therapy  Soft tissue mobilization    Manual therapy  comments  manual therapy completed seperately from all other interventions    Soft tissue mobilization  STM after needling to reduce pain and restrictions             PT Education - 01/07/19 1348    Education Details  soreness following needling, exercise technique, water consumption    Person(s) Educated  Patient    Methods  Explanation;Demonstration    Comprehension  Verbalized understanding       PT Short Term Goals - 12/30/18 1802      PT SHORT TERM GOAL #1   Title  Pt will have improved L GH AROM to 160deg for flexion and abd, and improved IR to 45deg and ER to 60deg in order to demo reduced soft tissue restrictions.    Time  2    Period  Weeks    Status  New    Target Date  01/13/19      PT SHORT TERM GOAL #2   Title  Pt will have improved MMT by 1/2 grade throughout in order to reduce pain and improved functional tasks at home.    Time  2    Period  Weeks    Status  New      PT SHORT TERM GOAL #3   Title  Pt will have reduced QuickDASH score by 10 points or > to demo reduced dysfunction due to pts L shoulder pain.     Time  2    Period  Weeks    Status  New        PT Long Term Goals - 12/30/18 1814      PT LONG TERM GOAL #1   Title  Pt will have improved L GH IR to 60deg and ER to 75deg (in prone) in order to further demo reduced soft tissue restrictions and allow her to perform self-grooming tasks with greater ease. 4    Time  4    Period  Weeks    Status  New    Target Date  01/27/19      PT LONG TERM GOAL #2   Title  Pt will have improved MMT by 1 grade throughout in order to further reduce pain and further maximize functional tasks at home and at work.     Time  4    Period  Weeks    Status  New      PT LONG TERM GOAL #  3   Title  Pt will report reduced overall pain to 3/10 or < at max to demo reduced soft tissue restrictions and improved overall function.    Time  4    Period  Weeks    Status  New            Plan - 01/07/19 1540     Clinical Impression Statement  Initiated trigger point dry needling this date to pt's anterior and middle deltoid in order to reduce pain. Good twitch responses elicited throughout both mm. followed up with manual STM to increase blood flow, reduce pain, and reduce restrictions and then moist heat pack to further reduce pain and restrictions. Ended with stretching and mm activation. Pt reporting increased soreness following session; educated pt that this was normal and to drink plenty of water and apply heat to help reduce post-needling soreness and she verbalized understanding. Continue as planned, progressing as able.     Stability/Clinical Decision Making  Stable/Uncomplicated    Rehab Potential  Good    PT Frequency  2x / week    PT Duration  4 weeks    PT Treatment/Interventions  ADLs/Self Care Home Management;Aquatic Therapy;Cryotherapy;Electrical Stimulation;Moist Heat;Traction;Ultrasound;DME Instruction;Functional mobility training;Therapeutic activities;Therapeutic exercise;Neuromuscular re-education;Patient/family education;Manual techniques;Scar mobilization;Passive range of motion;Dry needling;Energy conservation;Taping;Joint Manipulations;Spinal Manipulations    PT Next Visit Plan  continue dry needling, manual for STM/joint mobs/PROM for improved L shoulder pain/mobility, potentially PNF stretching, joint stretching in general, and therex for strengthening    PT Home Exercise Plan  eval: continue OT HEP    Consulted and Agree with Plan of Care  Patient       Patient will benefit from skilled therapeutic intervention in order to improve the following deficits and impairments:  Decreased endurance, Decreased range of motion, Decreased strength, Hypomobility, Increased fascial restricitons, Increased muscle spasms, Impaired flexibility, Impaired UE functional use, Improper body mechanics, Postural dysfunction  Visit Diagnosis: Chronic left shoulder pain  Stiffness of left shoulder, not  elsewhere classified  Other symptoms and signs involving the musculoskeletal system  Other muscle spasm     Problem List Patient Active Problem List   Diagnosis Date Noted   Postoperative anemia due to acute blood loss 11/24/2018   Acute cholecystitis 11/20/2018   Impingement syndrome of left shoulder 09/30/2018   Unspecified rotator cuff tear or rupture of right shoulder, not specified as traumatic 09/30/2018   Acromioclavicular joint arthritis 09/30/2018   Sun-damaged skin 01/03/2018   Morbid obesity ([place]) 11/05/2017   Depression with anxiety 11/05/2017   Nontraumatic incomplete tear of right rotator cuff 09/25/2016   Foreign body of shoulder right  09/25/2016   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   Shoulder joint painful on movement, right 08/29/2016   Epigastric pain 08/11/2015   Chest pain, rule out acute myocardial infarction 08/11/2015   Renal insufficiency 08/11/2015   Venous stasis 04/13/2015   Depression 04/13/2015   Essential hypertension 09/12/2014   PVCs (premature ventricular contractions) 09/07/2014   Precordial pain 09/07/2014       Geraldine Solar PT, DPT  [place] [facility] [address] [place], [place], [address] Phone: [phone]   Fax:  [phone]  Name: Jennifer Coleman MRN: [id number] Date of Birth: [birth year]

## 2019-01-09 ENCOUNTER — Ambulatory Visit (HOSPITAL_COMMUNITY): Payer: 59

## 2019-01-09 ENCOUNTER — Encounter (HOSPITAL_COMMUNITY): Payer: Self-pay

## 2019-01-09 ENCOUNTER — Other Ambulatory Visit: Payer: Self-pay

## 2019-01-09 DIAGNOSIS — M25612 Stiffness of left shoulder, not elsewhere classified: Secondary | ICD-10-CM

## 2019-01-09 DIAGNOSIS — G8929 Other chronic pain: Secondary | ICD-10-CM

## 2019-01-09 DIAGNOSIS — R29898 Other symptoms and signs involving the musculoskeletal system: Secondary | ICD-10-CM | POA: Diagnosis not present

## 2019-01-09 DIAGNOSIS — M25512 Pain in left shoulder: Secondary | ICD-10-CM | POA: Diagnosis not present

## 2019-01-09 DIAGNOSIS — M62838 Other muscle spasm: Secondary | ICD-10-CM

## 2019-01-09 NOTE — Therapy (Addendum)
[facility] [address] [place], [place], [address] Phone: [phone]   Fax:  [phone]  Physical Therapy Treatment  Patient Details  Name: Jennifer Coleman MRN: [id number] Date of Birth: 12-09-1950 Referring Provider (PT): Ralene Ok, MD   Encounter Date: 01/09/2019  PT End of Session - 01/09/19 1353    Visit Number  3    Number of Visits  8    Date for PT Re-Evaluation  01/27/19    Authorization Type  [facility]    Authorization Time Period  12/30/18 to 01/27/19    Authorization - Visit Number  20   17 visits with OT   Authorization - Number of Visits  25    PT Start Time  1348    PT Stop Time  1428    PT Time Calculation (min)  40 min    Activity Tolerance  Patient tolerated treatment well    Behavior During Therapy  [facility] for tasks assessed/performed       Past Medical History:  Diagnosis Date  . Anxiety   . Arthritis   . Dyspnea   . Dysrhythmia   . Family history of anesthesia complication 25 yrs ago   Sister allergic to sccinocholine - unable to come off ventilator  . Headache    started last night , pain level  8  . Hypertension   . Pneumonia   . PONV (postoperative nausea and vomiting)   . Premature ventricular contractions   . Shingles   . Varicose veins   . Varicose veins of left lower extremity     Past Surgical History:  Procedure Laterality Date  . CARPAL TUNNEL RELEASE    . CERVICAL DISC SURGERY  1992   Fusion  . CHOLECYSTECTOMY N/A 11/21/2018   Procedure: LAPAROSCOPIC CHOLECYSTECTOMY;  Surgeon: Lucretia Roers, MD;  Location: [facility];  Service: General;  Laterality: N/A;  . COLONOSCOPY N/A 06/07/2016   Procedure: COLONOSCOPY;  Surgeon: Malissa Hippo, MD;  [facility];  Service: Endoscopy;  Laterality: N/A;  1:00  . KNEE ARTHROSCOPY  09/16/2012   Procedure: ARTHROSCOPY KNEE;  Surgeon: Valeria Batman, MD;  Location: [facility] OR;  Service: Orthopedics;  Laterality: Right;  Right Knee  Arthroscopy  . SALPINGOOPHORECTOMY Left 1985  . SHOULDER ARTHROSCOPY WITH OPEN ROTATOR CUFF REPAIR AND DISTAL CLAVICLE ACROMINECTOMY Right 05/03/2016   Procedure: RIGHT SHOULDER ARTHROSCOPY WITH MINI-OPEN ROTATOR CUFF REPAIR,DISTAL CLAVICLE RESECTION, SUBACROMIAL DECOMPRESSION.;  Surgeon: Valeria Batman, MD;  Location: [facility];  Service: Orthopedics;  Laterality: Right;  . SHOULDER ARTHROSCOPY WITH ROTATOR CUFF REPAIR Right 09/25/2016   Procedure: Right Shoulder Manipulation, Arthroscopic Debridement of Joint, Removal of Loose Body and Re-Repair of Rotator Cuff;  Surgeon: Valeria Batman, MD;  Location: [facility] OR;  Service: Orthopedics;  Laterality: Right;    There were no vitals filed for this visit.  Subjective Assessment - 01/09/19 1351    Subjective  Pt states that she just got a massage. Her shoulder is still botheirng her. She was pretty sore following the needling.     Limitations  Lifting    Patient Stated Goals  for the mm to quit being tight and achy; for me to be able to have full rotation and movement back    Currently in Pain?  Yes    Pain Score  3     Pain Location  Shoulder    Pain Orientation  Left    Pain  Descriptors / Indicators  Aching;Dull    Pain Type  Chronic pain    Pain Onset  More than a month ago    Pain Frequency  Intermittent    Aggravating Factors   movement    Pain Relieving Factors  rest    Effect of Pain on Daily Activities  min effect              [facility] Adult PT Treatment/Exercise - 01/09/19 0001      Shoulder Exercises: Supine   Other Supine Exercises  L GH flexion isometrics 10x10" holds for pain control      Shoulder Exercises: Stretch   Table Stretch - External Rotation Limitations  seated lats strech (child's pose) 2x30" holds with [facility] ER    Other Shoulder Stretches  pec stretch in doorway with arms at chest level 3x30"    Other Shoulder Stretches  L anterior delt stretch in doorway 3x30"      Manual Therapy   Manual  Therapy  Myofascial release;Passive ROM    Manual therapy comments  manual therapy completed seperately from all other interventions    Myofascial Release  MFR to lats/teres major at insertion, anteiror and middle delt, biceps to reduce restrictions and pain    Passive ROM  GH flexion, ER             PT Education - 01/09/19 1349    Education Details  anatomy and potential contributing factors to pain; exercise technique, continue HEP    Person(s) Educated  Patient    Methods  Explanation;Demonstration    Comprehension  Verbalized understanding;Returned demonstration       PT Short Term Goals - 12/30/18 1802      PT SHORT TERM GOAL #1   Title  Pt will have improved L GH AROM to 160deg for flexion and abd, and improved IR to 45deg and ER to 60deg in order to demo reduced soft tissue restrictions.    Time  2    Period  Weeks    Status  New    Target Date  01/13/19      PT SHORT TERM GOAL #2   Title  Pt will have improved MMT by 1/2 grade throughout in order to reduce pain and improved functional tasks at home.    Time  2    Period  Weeks    Status  New      PT SHORT TERM GOAL #3   Title  Pt will have reduced QuickDASH score by 10 points or > to demo reduced dysfunction due to pt?Ts L shoulder pain.     Time  2    Period  Weeks    Status  New        PT Long Term Goals - 12/30/18 1814      PT LONG TERM GOAL #1   Title  Pt will have improved L GH IR to 60deg and ER to 75deg (in prone) in order to further demo reduced soft tissue restrictions and allow her to perform self-grooming tasks with greater ease. 4    Time  4    Period  Weeks    Status  New    Target Date  01/27/19      PT LONG TERM GOAL #2   Title  Pt will have improved MMT by 1 grade throughout in order to further reduce pain and further maximize functional tasks at home and at work.     Time  4  Period  Weeks    Status  New      PT LONG TERM GOAL #3   Title  Pt will report reduced overall pain to  3/10 or < at max to demo reduced soft tissue restrictions and improved overall function.    Time  4    Period  Weeks    Status  New            Plan - 01/09/19 1431    Clinical Impression Statement  Continued with established POC focusing on reducing L shoulder pain. Introduced pt to pec stretching and child's pose for lats stretch to facilitate reduced pain and soft tissue restrictions. Spent portion of session educating pt on anatomy and potential contributing factors to her pain. Ended with MFR throughout shoulder mm and pt with most pain recreated at posterior RTC insertion/middle delt. Will resume dry needling to this next session and to posterior RTC mm bellies.     Stability/Clinical Decision Making  Stable/Uncomplicated    Rehab Potential  Good    PT Frequency  2x / week    PT Duration  4 weeks    PT Treatment/Interventions  ADLs/Self Care Home Management;Aquatic Therapy;Cryotherapy;Electrical Stimulation;Moist Heat;Traction;Ultrasound;DME Instruction;Functional mobility training;Therapeutic activities;Therapeutic exercise;Neuromuscular re-education;Patient/family education;Manual techniques;Scar mobilization;Passive range of motion;Dry needling;Energy conservation;Taping;Joint Manipulations;Spinal Manipulations    PT Next Visit Plan  continue dry needling, manual for STM/joint mobs/PROM for improved L shoulder pain/mobility, potentially PNF stretching, joint stretching in general, and therex for strengthening    PT Home Exercise Plan  eval: continue OT HEP    Consulted and Agree with Plan of Care  Patient       Patient will benefit from skilled therapeutic intervention in order to improve the following deficits and impairments:  Decreased endurance, Decreased range of motion, Decreased strength, Hypomobility, Increased fascial restricitons, Increased muscle spasms, Impaired flexibility, Impaired UE functional use, Improper body mechanics, Postural dysfunction  Visit  Diagnosis: Chronic left shoulder pain  Stiffness of left shoulder, not elsewhere classified  Other symptoms and signs involving the musculoskeletal system  Other muscle spasm     Problem List Patient Active Problem List   Diagnosis Date Noted  . Postoperative anemia due to acute blood loss 11/24/2018  . Acute cholecystitis 11/20/2018  . Impingement syndrome of left shoulder 09/30/2018  . Unspecified rotator cuff tear or rupture of right shoulder, not specified as traumatic 09/30/2018  . Acromioclavicular joint arthritis 09/30/2018  . Sun-damaged skin 01/03/2018  . Morbid obesity (HCC) 11/05/2017  . Depression with anxiety 11/05/2017  . Nontraumatic incomplete tear of right rotator cuff 09/25/2016  . Foreign body of shoulder right  09/25/2016  . 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 Shoulder joint painful on movement, right 08/29/2016  . Epigastric pain 08/11/2015  . Chest pain, rule out acute myocardial infarction 08/11/2015  . Renal insufficiency 08/11/2015  . Venous stasis 04/13/2015  . Depression 04/13/2015  . Essential hypertension 09/12/2014  . PVCs (premature ventricular contractions) 09/07/2014  . Precordial pain 09/07/2014        Jac Canavan PT, DPT  [facility] [address] [place], [place], [address] Phone: [phone]   Fax:  [phone]  Name: Jennifer Coleman MRN: [id number] Date of Birth: 09/18/1951

## 2019-01-12 ENCOUNTER — Ambulatory Visit (HOSPITAL_COMMUNITY): Payer: 59

## 2019-01-12 ENCOUNTER — Encounter (HOSPITAL_COMMUNITY): Payer: Self-pay

## 2019-01-12 ENCOUNTER — Other Ambulatory Visit: Payer: Self-pay

## 2019-01-12 DIAGNOSIS — M25612 Stiffness of left shoulder, not elsewhere classified: Secondary | ICD-10-CM

## 2019-01-12 DIAGNOSIS — G8929 Other chronic pain: Secondary | ICD-10-CM

## 2019-01-12 DIAGNOSIS — M62838 Other muscle spasm: Secondary | ICD-10-CM

## 2019-01-12 DIAGNOSIS — M25512 Pain in left shoulder: Principal | ICD-10-CM

## 2019-01-12 DIAGNOSIS — R29898 Other symptoms and signs involving the musculoskeletal system: Secondary | ICD-10-CM

## 2019-01-12 NOTE — Therapy (Signed)
[facility] [address] [place], [place], [address] Phone: [phone]   Fax:  [phone]  Physical Therapy Treatment  Patient Details  Name: Jennifer Coleman MRN: [id number] Date of Birth: 1951/06/25 Referring Provider (PT): Ralene Ok, MD   Encounter Date: 01/12/2019  PT End of Session - 01/12/19 1441    Visit Number  4    Number of Visits  8    Date for PT Re-Evaluation  01/27/19    Authorization Type  [facility]    Authorization Time Period  12/30/18 to 01/27/19    Authorization - Visit Number  21   17 visits with OT   Authorization - Number of Visits  25    PT Start Time  1441   pt late   PT Stop Time  1511    PT Time Calculation (min)  30 min    Activity Tolerance  Patient tolerated treatment well    Behavior During Therapy  [facility] for tasks assessed/performed       Past Medical History:  Diagnosis Date  . Anxiety   . Arthritis   . Dyspnea   . Dysrhythmia   . Family history of anesthesia complication 25 yrs ago   Sister allergic to sccinocholine - unable to come off ventilator  . Headache    started last night , pain level  8  . Hypertension   . Pneumonia   . PONV (postoperative nausea and vomiting)   . Premature ventricular contractions   . Shingles   . Varicose veins   . Varicose veins of left lower extremity     Past Surgical History:  Procedure Laterality Date  . CARPAL TUNNEL RELEASE    . CERVICAL DISC SURGERY  1992   Fusion  . CHOLECYSTECTOMY N/A 11/21/2018   Procedure: LAPAROSCOPIC CHOLECYSTECTOMY;  Surgeon: Lucretia Roers, MD;  Location: [facility];  Service: General;  Laterality: N/A;  . COLONOSCOPY N/A 06/07/2016   Procedure: COLONOSCOPY;  Surgeon: Malissa Hippo, MD;  [facility];  Service: Endoscopy;  Laterality: N/A;  1:00  . KNEE ARTHROSCOPY  09/16/2012   Procedure: ARTHROSCOPY KNEE;  Surgeon: Valeria Batman, MD;  Location: [facility] OR;  Service: Orthopedics;  Laterality: Right;   Right Knee Arthroscopy  . SALPINGOOPHORECTOMY Left 1985  . SHOULDER ARTHROSCOPY WITH OPEN ROTATOR CUFF REPAIR AND DISTAL CLAVICLE ACROMINECTOMY Right 05/03/2016   Procedure: RIGHT SHOULDER ARTHROSCOPY WITH MINI-OPEN ROTATOR CUFF REPAIR,DISTAL CLAVICLE RESECTION, SUBACROMIAL DECOMPRESSION.;  Surgeon: Valeria Batman, MD;  Location: [facility];  Service: Orthopedics;  Laterality: Right;  . SHOULDER ARTHROSCOPY WITH ROTATOR CUFF REPAIR Right 09/25/2016   Procedure: Right Shoulder Manipulation, Arthroscopic Debridement of Joint, Removal of Loose Body and Re-Repair of Rotator Cuff;  Surgeon: Valeria Batman, MD;  Location: [facility];  Service: Orthopedics;  Laterality: Right;    There were no vitals filed for this visit.  Subjective Assessment - 01/12/19 1441    Subjective  Pt states she still can't tell a difference in her pain.     Limitations  Lifting    Patient Stated Goals  for the mm to quit being tight and achy; for me to be able to have full rotation and movement back    Currently in Pain?  Yes    Pain Score  3     Pain Location  Shoulder    Pain Orientation  Left    Pain Descriptors / Indicators  Aching;Dull  Pain Onset  More than a month ago    Pain Frequency  Intermittent    Aggravating Factors   movement    Pain Relieving Factors  rest    Effect of Pain on Daily Activities  min effect         [facility] Adult PT Treatment/Exercise - 01/12/19 0001      Shoulder Exercises: Seated   Flexion  Left;15 reps    Flexion Limitations  after needling for mm activation    Abduction  Left;15 reps    ABduction Limitations  after needling for mm activation      Shoulder Exercises: Sidelying   External Rotation  Left;20 reps    External Rotation Limitations  towel roll; after needling for mm activation      Manual Therapy   Manual Therapy  Soft tissue mobilization    Manual therapy comments  manual therapy completed seperately from all other interventions    Soft tissue  mobilization  STM after needling to reduce pain and restrictions       Trigger Point Dry Needling - 01/12/19 0001    Consent Given?  Yes    Education Handout Provided  Previously provided    Muscles Treated Upper Quadrant  Infraspinatus;Deltoid    Infraspinatus Response  Twitch response elicited;Palpable increased muscle length   L in sidelying   Deltoid Response  Twitch response elicited;Palpable increased muscle length   middle delt in supine          PT Education - 01/12/19 1441    Education Details  soreness from needling, continue HEP    Person(s) Educated  Patient    Methods  Explanation;Demonstration    Comprehension  Verbalized understanding       PT Short Term Goals - 12/30/18 1802      PT SHORT TERM GOAL #1   Title  Pt will have improved L GH AROM to 160deg for flexion and abd, and improved IR to 45deg and ER to 60deg in order to demo reduced soft tissue restrictions.    Time  2    Period  Weeks    Status  New    Target Date  01/13/19      PT SHORT TERM GOAL #2   Title  Pt will have improved MMT by 1/2 grade throughout in order to reduce pain and improved functional tasks at home.    Time  2    Period  Weeks    Status  New      PT SHORT TERM GOAL #3   Title  Pt will have reduced QuickDASH score by 10 points or > to demo reduced dysfunction due to pt?Ts L shoulder pain.     Time  2    Period  Weeks    Status  New        PT Long Term Goals - 12/30/18 1814      PT LONG TERM GOAL #1   Title  Pt will have improved L GH IR to 60deg and ER to 75deg (in prone) in order to further demo reduced soft tissue restrictions and allow her to perform self-grooming tasks with greater ease. 4    Time  4    Period  Weeks    Status  New    Target Date  01/27/19      PT LONG TERM GOAL #2   Title  Pt will have improved MMT by 1 grade throughout in order to further reduce pain and further  maximize functional tasks at home and at work.     Time  4    Period  Weeks     Status  New      PT LONG TERM GOAL #3   Title  Pt will report reduced overall pain to 3/10 or < at max to demo reduced soft tissue restrictions and improved overall function.    Time  4    Period  Weeks    Status  New            Plan - 01/12/19 1514    Clinical Impression Statement  Pt continues to present to therapy with minimal changes in pain with AROM. Resumed dry needling this date to middle deltoid and initiated it to infraspinatus mm belly. Good twitch responses elicited in both mm groups, but mostly middle delt. Ended with manual STM for further reduced restrictions, pain, and post-needling soreness. Ended with mm activation of both mm groups. Pt reporting soreness at EOS but felt it was different from her c/o pain at beginning of session. Able to get hand behind back with less pain this date. continue as planned progressing as able.     Stability/Clinical Decision Making  Stable/Uncomplicated    Rehab Potential  Good    PT Frequency  2x / week    PT Duration  4 weeks    PT Treatment/Interventions  ADLs/Self Care Home Management;Aquatic Therapy;Cryotherapy;Electrical Stimulation;Moist Heat;Traction;Ultrasound;DME Instruction;Functional mobility training;Therapeutic activities;Therapeutic exercise;Neuromuscular re-education;Patient/family education;Manual techniques;Scar mobilization;Passive range of motion;Dry needling;Energy conservation;Taping;Joint Manipulations;Spinal Manipulations    PT Next Visit Plan  continue dry needling, manual for STM/joint mobs/PROM for improved L shoulder pain/mobility, potentially PNF stretching, joint stretching in general, and progress therex for strengthening    PT Home Exercise Plan  eval: continue OT HEP    Consulted and Agree with Plan of Care  Patient       Patient will benefit from skilled therapeutic intervention in order to improve the following deficits and impairments:  Decreased endurance, Decreased range of motion, Decreased strength,  Hypomobility, Increased fascial restricitons, Increased muscle spasms, Impaired flexibility, Impaired UE functional use, Improper body mechanics, Postural dysfunction  Visit Diagnosis: Chronic left shoulder pain  Stiffness of left shoulder, not elsewhere classified  Other symptoms and signs involving the musculoskeletal system  Other muscle spasm     Problem List Patient Active Problem List   Diagnosis Date Noted  . Postoperative anemia due to acute blood loss 11/24/2018  . Acute cholecystitis 11/20/2018  . Impingement syndrome of left shoulder 09/30/2018  . Unspecified rotator cuff tear or rupture of right shoulder, not specified as traumatic 09/30/2018  . Acromioclavicular joint arthritis 09/30/2018  . Sun-damaged skin 01/03/2018  . Morbid obesity (HCC) 11/05/2017  . Depression with anxiety 11/05/2017  . Nontraumatic incomplete tear of right rotator cuff 09/25/2016  . Foreign body of shoulder right  09/25/2016  . 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 Shoulder joint painful on movement, right 08/29/2016  . Epigastric pain 08/11/2015  . Chest pain, rule out acute myocardial infarction 08/11/2015  . Renal insufficiency 08/11/2015  . Venous stasis 04/13/2015  . Depression 04/13/2015  . Essential hypertension 09/12/2014  . PVCs (premature ventricular contractions) 09/07/2014  . Precordial pain 09/07/2014       Jac Canavan PT, DPT   [facility] [address] [place], [place], [address] Phone: [phone]   Fax:  [phone]  Name: LINLEY HEINERT MRN: [id number] Date of Birth: January 27, 1951

## 2019-01-13 ENCOUNTER — Encounter (HOSPITAL_COMMUNITY): Payer: Self-pay

## 2019-01-14 ENCOUNTER — Ambulatory Visit (HOSPITAL_COMMUNITY): Payer: 59

## 2019-01-14 ENCOUNTER — Telehealth (HOSPITAL_COMMUNITY): Payer: Self-pay | Admitting: Family Medicine

## 2019-01-14 NOTE — Telephone Encounter (Signed)
01/14/19  pt having issues with blood pressure and doesn't want to come to therapy

## 2019-01-16 ENCOUNTER — Telehealth (HOSPITAL_COMMUNITY): Payer: Self-pay

## 2019-01-16 DIAGNOSIS — H0014 Chalazion left upper eyelid: Secondary | ICD-10-CM | POA: Diagnosis not present

## 2019-01-16 DIAGNOSIS — H00011 Hordeolum externum right upper eyelid: Secondary | ICD-10-CM | POA: Diagnosis not present

## 2019-01-16 NOTE — Telephone Encounter (Signed)
Left voicemail for pt regarding our clinic being closed for next 2 weeks due to COVID-19. Asked her to call our office back regarding scheduling more appointments for once we are reopen.   Geraldine Solar PT, DPT

## 2019-01-16 NOTE — Telephone Encounter (Signed)
Pt returned phone call and asked to be rescheduled for the week of 02/02/19; PT rescheduled pt for 2 appointments that week.  Geraldine Solar PT, DPT

## 2019-01-20 ENCOUNTER — Telehealth (HOSPITAL_COMMUNITY): Payer: Self-pay

## 2019-01-20 ENCOUNTER — Ambulatory Visit (HOSPITAL_COMMUNITY): Payer: 59

## 2019-01-20 NOTE — Telephone Encounter (Signed)
Left voicemail checking in with pt for her weekly phone call. Told her to call the office back if she had any questions or concerns, but otherwise, we would call her back next week.  Geraldine Solar PT, DPT

## 2019-01-23 ENCOUNTER — Ambulatory Visit (HOSPITAL_COMMUNITY): Payer: 59

## 2019-01-27 ENCOUNTER — Encounter (HOSPITAL_COMMUNITY): Payer: Self-pay

## 2019-01-28 ENCOUNTER — Telehealth (HOSPITAL_COMMUNITY): Payer: Self-pay

## 2019-01-28 NOTE — Telephone Encounter (Signed)
Spoke to pt regarding cancelling his appointments until further notice due to COVID-19 in order to ensure pt and staff safety during this time. Pt not interested in telehealth sessions, weekly phone calls, or updated HEP at this time. Told pt that we would call her to reschedule once our clinic reopens and she verbalized understanding.  Geraldine Solar PT, DPT

## 2019-01-30 ENCOUNTER — Encounter (HOSPITAL_COMMUNITY): Payer: Self-pay

## 2019-01-30 MED FILL — ESCITALOPRAM 20 MG TABLET: 20 | 90 days supply | Qty: 90 | Fill #0

## 2019-02-02 ENCOUNTER — Ambulatory Visit (HOSPITAL_COMMUNITY): Payer: 59

## 2019-02-04 ENCOUNTER — Encounter (HOSPITAL_COMMUNITY): Payer: Self-pay

## 2019-02-06 ENCOUNTER — Other Ambulatory Visit: Payer: Self-pay | Admitting: Family Medicine

## 2019-02-06 MED FILL — FLUTICASONE PROP 50 MCG SPR: 50 | 30 days supply | Qty: 16 | Fill #0

## 2019-02-23 DIAGNOSIS — M7740 Metatarsalgia, unspecified foot: Secondary | ICD-10-CM | POA: Diagnosis not present

## 2019-02-23 DIAGNOSIS — M79675 Pain in left toe(s): Secondary | ICD-10-CM | POA: Diagnosis not present

## 2019-02-23 DIAGNOSIS — M7752 Other enthesopathy of left foot: Secondary | ICD-10-CM | POA: Diagnosis not present

## 2019-02-27 ENCOUNTER — Other Ambulatory Visit: Payer: Self-pay | Admitting: Podiatry

## 2019-02-27 ENCOUNTER — Other Ambulatory Visit (HOSPITAL_COMMUNITY): Payer: Self-pay | Admitting: Podiatry

## 2019-02-27 DIAGNOSIS — G5762 Lesion of plantar nerve, left lower limb: Secondary | ICD-10-CM

## 2019-03-04 ENCOUNTER — Ambulatory Visit (HOSPITAL_COMMUNITY)
Admission: RE | Admit: 2019-03-04 | Discharge: 2019-03-04 | Disposition: A | Discharge status: Home / Self Care | Payer: 59 | Source: Ambulatory Visit | Attending: Podiatry | Admitting: Podiatry

## 2019-03-04 ENCOUNTER — Other Ambulatory Visit: Payer: Self-pay

## 2019-03-04 ENCOUNTER — Ambulatory Visit (HOSPITAL_COMMUNITY): Payer: 59

## 2019-03-04 DIAGNOSIS — G5762 Lesion of plantar nerve, left lower limb: Secondary | ICD-10-CM | POA: Insufficient documentation

## 2019-03-04 DIAGNOSIS — M19072 Primary osteoarthritis, left ankle and foot: Secondary | ICD-10-CM | POA: Diagnosis not present

## 2019-03-10 ENCOUNTER — Telehealth (HOSPITAL_COMMUNITY): Payer: Self-pay

## 2019-03-10 NOTE — Telephone Encounter (Signed)
I called and spoke with Jennifer Coleman, I confirmed her identity. I informed her our office is re-opening and that we can see her for an in clinic follow up if she would like to do so. She stated she was happy with her current function and feels she has gotten all she can out of therapy. She would like to be discharged at this time.   Kipp Brood, PT, DPT, [facility] Physical Therapist with [facility]  03/10/2019 11:15 AM

## 2019-03-13 ENCOUNTER — Other Ambulatory Visit: Payer: Self-pay | Admitting: Physician Assistant

## 2019-03-13 ENCOUNTER — Telehealth: Payer: Self-pay | Admitting: Family Medicine

## 2019-03-13 DIAGNOSIS — D0471 Carcinoma in situ of skin of right lower limb, including hip: Secondary | ICD-10-CM | POA: Diagnosis not present

## 2019-03-13 DIAGNOSIS — C44729 Squamous cell carcinoma of skin of left lower limb, including hip: Secondary | ICD-10-CM | POA: Diagnosis not present

## 2019-03-13 DIAGNOSIS — L570 Actinic keratosis: Secondary | ICD-10-CM | POA: Diagnosis not present

## 2019-03-13 MED ORDER — ENALAPRIL MALEATE 20 MG PO TABS: 20.0000 mg | ORAL_TABLET | Freq: Two times a day (BID) | ORAL | 0 refills | Status: DC

## 2019-03-13 MED FILL — ENALAPRIL MALEATE 20 MG TAB: 20 | 30 days supply | Qty: 30 | Fill #0

## 2019-03-13 NOTE — Telephone Encounter (Signed)
good

## 2019-03-13 NOTE — Telephone Encounter (Signed)
Patient last seen 05/2018-Patient scheduled virtual visit for next week with Dr Richardson Landry and one refill was sent electronically to [facility].

## 2019-03-13 NOTE — Telephone Encounter (Signed)
Pt is needing a refill on enalapril (VASOTEC) 20 MG tablet. She is completely out of the medication. She has only been able to take one a day for the last three days instead of two a day.   Please send to [place], [place]

## 2019-03-16 ENCOUNTER — Encounter: Payer: Self-pay | Admitting: Family Medicine

## 2019-03-16 ENCOUNTER — Ambulatory Visit (INDEPENDENT_AMBULATORY_CARE_PROVIDER_SITE_OTHER): Payer: 59 | Admitting: Family Medicine

## 2019-03-16 ENCOUNTER — Other Ambulatory Visit: Payer: Self-pay

## 2019-03-16 DIAGNOSIS — F418 Other specified anxiety disorders: Secondary | ICD-10-CM | POA: Diagnosis not present

## 2019-03-16 DIAGNOSIS — I10 Essential (primary) hypertension: Secondary | ICD-10-CM | POA: Diagnosis not present

## 2019-03-16 DIAGNOSIS — J301 Allergic rhinitis due to pollen: Secondary | ICD-10-CM | POA: Diagnosis not present

## 2019-03-16 MED ORDER — ENALAPRIL MALEATE 20 MG PO TABS: 20.0000 mg | ORAL_TABLET | Freq: Two times a day (BID) | ORAL | 1 refills | Status: DC

## 2019-03-16 MED ORDER — ESCITALOPRAM OXALATE 20 MG PO TABS: 20.0000 mg | ORAL_TABLET | Freq: Every day | ORAL | 1 refills | Status: DC

## 2019-03-16 MED ORDER — IBUPROFEN 800 MG PO TABS: 800.0000 mg | ORAL_TABLET | Freq: Three times a day (TID) | ORAL | 2 refills | Status: DC | PRN

## 2019-03-16 NOTE — Progress Notes (Signed)
   Subjective:  Audio plus video  Patient ID: Jennifer Coleman, female    DOB: [birth year], 68 y.o.   MRN: [id number]  Hypertension  This is a chronic problem. The current episode started more than 1 year ago. Treatments tried: enalapril. There are no compliance problems (takes meds every day, eats healthy, walks 2 miles at least 5 days a week).    Right hip pain started this morning. Wants to get rx for ibuprofen 800mg .   Allergies. Nasal stuffness. Itchy eyes. Takes zyrtec.  Uses Flonase only off and on  Virtual Visit via Video Note  I connected with RICHELE STRAND on 03/16/19 at  9:30 AM EDT by a video enabled telemedicine application and verified that I am speaking with the correct person using two identifiers.  Location: Patient: home Provider: office   I discussed the limitations of evaluation and management by telemedicine and the availability of in person appointments. The patient expressed understanding and agreed to proceed.  History of Present Illness:    Observations/Objective:   Assessment and Plan:   Follow Up Instructions:    I discussed the assessment and treatment plan with the patient. The patient was provided an opportunity to ask questions and all were answered. The patient agreed with the plan and demonstrated an understanding of the instructions.   The patient was advised to call back or seek an in-person evaluation if the symptoms worsen or if the condition fails to improve as anticipated.  I provided 25 minutes of non-face-to-face time during this encounter.  Patient notes ongoing compliance with antidepressant medication. No obvious side effects. Reports does not miss a dose. Overall continues to help depression substantially. No thoughts of homicide or suicide. Would like to maintain medication.  Blood pressure medicine and blood pressure levels reviewed today with patient. Compliant with blood pressure medicine. States does not miss a dose. No obvious side  effects. Blood pressure generally good when checked elsewhere. Watching salt intake.  Patient notes hip pain.  Has used ibuprofen in the past for something like this.  It definitely helped.  Requesting a refill.     Review of Systems No headache, no major weight loss or weight gain, no chest pain no back pain abdominal pain no change in bowel habits complete ROS otherwise negative     Objective:   Physical Exam A    Virtual visit    Assessment & Plan:  Impression hypertension.  Current good control.  Discussed compliance discussed  2.  Allergic rhinitis.  Suboptimal discussed encouraged to add Flonase  3.  Depression with element of anxiety clinically stable.  Patient feels definitely benefits from Lexapro will maintain.  4.  Right hip pain.  Ibuprofen as needed symptom care discussed

## 2019-03-16 NOTE — Progress Notes (Signed)
   Subjective:    Patient ID: Jennifer Coleman, female    DOB: Feb 01, 1951, 68 y.o.   MRN: [id number]  HPI    Review of Systems     Objective:   Physical Exam        Assessment & Plan:

## 2019-04-06 MED FILL — ENALAPRIL MALEATE 20 MG TAB: 20 | 90 days supply | Qty: 180 | Fill #0

## 2019-04-12 MED FILL — ESCITALOPRAM 20 MG TABLET: 20 | 90 days supply | Qty: 90 | Fill #0

## 2019-04-28 DIAGNOSIS — S52122A Displaced fracture of head of left radius, initial encounter for closed fracture: Secondary | ICD-10-CM | POA: Diagnosis not present

## 2019-04-28 DIAGNOSIS — R0781 Pleurodynia: Secondary | ICD-10-CM | POA: Diagnosis not present

## 2019-04-28 DIAGNOSIS — M25522 Pain in left elbow: Secondary | ICD-10-CM | POA: Diagnosis not present

## 2019-04-28 DIAGNOSIS — S52125A Nondisplaced fracture of head of left radius, initial encounter for closed fracture: Secondary | ICD-10-CM | POA: Diagnosis not present

## 2019-04-28 DIAGNOSIS — M25422 Effusion, left elbow: Secondary | ICD-10-CM | POA: Diagnosis not present

## 2019-05-08 ENCOUNTER — Ambulatory Visit: Payer: 59 | Admitting: Orthopaedic Surgery

## 2019-05-11 ENCOUNTER — Ambulatory Visit (INDEPENDENT_AMBULATORY_CARE_PROVIDER_SITE_OTHER): Payer: 59

## 2019-05-11 ENCOUNTER — Ambulatory Visit (INDEPENDENT_AMBULATORY_CARE_PROVIDER_SITE_OTHER): Payer: 59 | Admitting: Orthopaedic Surgery

## 2019-05-11 ENCOUNTER — Other Ambulatory Visit: Payer: Self-pay

## 2019-05-11 ENCOUNTER — Encounter: Payer: Self-pay | Admitting: Orthopaedic Surgery

## 2019-05-11 VITALS — BP 154/88 | HR 73 | Resp 16 | Ht 65.0 in | Wt 235.0 lb

## 2019-05-11 DIAGNOSIS — S42402D Unspecified fracture of lower end of left humerus, subsequent encounter for fracture with routine healing: Secondary | ICD-10-CM

## 2019-05-11 DIAGNOSIS — M25562 Pain in left knee: Secondary | ICD-10-CM

## 2019-05-11 DIAGNOSIS — G8929 Other chronic pain: Secondary | ICD-10-CM | POA: Diagnosis not present

## 2019-05-11 NOTE — Progress Notes (Signed)
Office Visit Note   Patient: Jennifer Coleman           Date of Birth: Sep 30, 1951           MRN: [id number] Visit Date: 05/11/2019              Requested by: Mikey Kirschner, [place],  [place] [address] PCP: Mikey Kirschner, MD   Assessment & Plan: Visit Diagnoses:  1. Closed fracture of left elbow with routine healing, subsequent encounter   2. Chronic pain of left knee   Nondisplaced left radial head fracture x2 weeks.  Will begin range of motion exercises.  Will wear the splint for 1 more week.  Check in 2 weeks.  Left knee appears to be a contusion without evidence of a fracture.  This should resolve on its own  Plan:   Follow-Up Instructions: No follow-ups on file.   Orders:  Orders Placed This Encounter  Procedures  . XR Elbow 2 Views Left  . XR KNEE 3 VIEW LEFT   No orders of the defined types were placed in this encounter.     Procedures: No procedures performed   Clinical Data: No additional findings.   Subjective: Chief Complaint  Patient presents with  . Left Knee - Injury  . Left Elbow - Injury   Ms. Hallstrom is a 68 year old female who presents with left elbow and left knee pain. She fell on a trip to [place] 6/29/202 for her elbow injury and 04/30/2019 for her knee injury. She went to the emergency room in [place] and was diagnosed with a fracture of her left elbow.  She was placed in a splint and has been relatively comfortable.  She is having difficulty walking, difficulty bending her knee, bruising, redness, warm to the touch, difficulty sleeping at night, weakness and numbness.  She is using ice and elevating her knee. She is taking IBU which doesn't help. She is not diabetic. She had not had surgery to her left knee. Her pain is from her thigh to her toes.  She relates that she is not had her knee evaluated but is much less swollen and not having much trouble ambulating  She is having difficulty rotating her arm. She has some  swelling. No numbness or tingling.    HPI  Review of Systems  Constitutional: Positive for fatigue.  HENT: Negative for trouble swallowing.   Eyes: Negative for pain.  Respiratory: Negative for shortness of breath.   Cardiovascular: Positive for leg swelling.  Gastrointestinal: Negative for constipation.  Endocrine: Negative for cold intolerance.  Genitourinary: Negative for difficulty urinating.  Musculoskeletal: Positive for gait problem and joint swelling.  Skin: Negative for rash.  Allergic/Immunologic: Positive for food allergies.  Neurological: Positive for weakness and numbness.  Hematological: Bruises/bleeds easily.  Psychiatric/Behavioral: Positive for sleep disturbance.     Objective: Vital Signs: BP (!) 154/88 (BP Location: Right Arm, Patient Position: Sitting, Cuff Size: Normal)   Pulse 73   Resp 16   Ht 5' 5" (1.651 m)   Wt 235 lb (106.6 kg)   BMI 39.11 kg/m   Physical Exam Constitutional:      Appearance: She is well-developed.  Eyes:     Pupils: Pupils are equal, round, and reactive to light.  Pulmonary:     Effort: Pulmonary effort is normal.  Skin:    General: Skin is warm and dry.  Neurological:     Mental Status: She is  alert and oriented to person, place, and time.  Psychiatric:        Behavior: Behavior normal.     Ortho Exam awake alert and oriented x3.  Comfortable sitting.  Left upper extremity was in a arm splint.  This was removed.  There was some tenderness in the area of the radial head.  She was somewhat "stiff".  Fully extend her elbow. she did have minimal decreased pronation and supination. good grip and good release.  Left knee with a healing bruise the lateral patella.  No knee effusion.  Resolving ecchymosis along the anterior tibial region.  Walks without a limp.  No knee crepitation  Specialty Comments:  No specialty comments available.  Imaging: No results found.   PMFS History: Patient Active Problem List   Diagnosis  Date Noted  . Postoperative anemia due to acute blood loss 11/24/2018  . Acute cholecystitis 11/20/2018  . Impingement syndrome of left shoulder 09/30/2018  . Unspecified rotator cuff tear or rupture of right shoulder, not specified as traumatic 09/30/2018  . Acromioclavicular joint arthritis 09/30/2018  . Sun-damaged skin 01/03/2018  . Morbid obesity ([place]) 11/05/2017  . Depression with anxiety 11/05/2017  . Nontraumatic incomplete tear of right rotator cuff 09/25/2016  . Foreign body of shoulder right  09/25/2016  . 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 Shoulder joint painful on movement, right 08/29/2016  . Epigastric pain 08/11/2015  . Chest pain, rule out acute myocardial infarction 08/11/2015  . Renal insufficiency 08/11/2015  . Venous stasis 04/13/2015  . Depression 04/13/2015  . Essential hypertension 09/12/2014  . PVCs (premature ventricular contractions) 09/07/2014  . Precordial pain 09/07/2014   Past Medical History:  Diagnosis Date  . Anxiety   . Arthritis   . Dyspnea   . Dysrhythmia   . Family history of anesthesia complication 25 yrs ago   Sister allergic to sccinocholine - unable to come off ventilator  . Headache    started last night , pain level  8  . Hypertension   . Pneumonia   . PONV (postoperative nausea and vomiting)   . Premature ventricular contractions   . Shingles   . Varicose veins   . Varicose veins of left lower extremity     Family History  Problem Relation Age of Onset  . Heart disease Sister   . Heart disease Brother        80's  . Diabetes Mellitus II Sister   . Multiple myeloma Mother   . Hypertension Sister   . Hypertension Father   . Heart attack Father     Past Surgical History:  Procedure Laterality Date  . CARPAL TUNNEL RELEASE    . CERVICAL DISC SURGERY  1992   Fusion  . CHOLECYSTECTOMY N/A 11/21/2018   Procedure: LAPAROSCOPIC CHOLECYSTECTOMY;  Surgeon: Virl Cagey, MD;  [facility]: [facility];  Service:  General;  Laterality: N/A;  . COLONOSCOPY N/A 06/07/2016   Procedure: COLONOSCOPY;  Surgeon: Rogene Houston, MD;  [facility];  Service: Endoscopy;  Laterality: N/A;  1:00  . KNEE ARTHROSCOPY  09/16/2012   Procedure: ARTHROSCOPY KNEE;  Surgeon: [name], MD;  Location: [facility];  Service: Orthopedics;  Laterality: Right;  Right Knee Arthroscopy  . SALPINGOOPHORECTOMY Left 1985  . SHOULDER ARTHROSCOPY WITH OPEN ROTATOR CUFF REPAIR AND DISTAL CLAVICLE ACROMINECTOMY Right 05/03/2016   Procedure: RIGHT SHOULDER ARTHROSCOPY WITH MINI-OPEN ROTATOR CUFF REPAIR,DISTAL CLAVICLE RESECTION, SUBACROMIAL DECOMPRESSION.;  Surgeon: [name], MD;  Location: [facility];  Service: Orthopedics;  Laterality: Right;  . SHOULDER ARTHROSCOPY WITH ROTATOR CUFF REPAIR Right 09/25/2016   Procedure: Right Shoulder Manipulation, Arthroscopic Debridement of Joint, Removal of Loose Body and Re-Repair of Rotator Cuff;  Surgeon: [name], MD;  Location: [facility];  Service: Orthopedics;  Laterality: Right;   Social History   Occupational History  . Not on file  Tobacco Use  . Smoking status: Former Smoker    Types: Cigarettes    Quit date: 09/22/1992    Years since quitting: 26.6  . Smokeless tobacco: Never Used  Substance and Sexual Activity  . Alcohol use: No    Alcohol/week: 0.0 standard drinks  . Drug use: No  . Sexual activity: Yes

## 2019-05-12 ENCOUNTER — Encounter (HOSPITAL_COMMUNITY): Payer: Self-pay

## 2019-05-12 NOTE — Therapy (Signed)
[place], [place], [address] Phone: [phone]   Fax:  [phone]  Patient Details  Name: Jennifer Coleman MRN: [id number] Date of Birth: Mar 06, 1951 Referring Provider:  No ref. provider found  Encounter Date: 05/12/2019   I called and spoke with Jennifer Coleman, I confirmed her identity. I informed her our office is re-opening and that we can see her for an in clinic follow up if she would like to do so. She stated she was happy with her current function and feels she has gotten all she can out of therapy. She would like to be discharged at this time.   Kipp Brood, PT, DPT, [facility] Physical Therapist with [facility]  PHYSICAL THERAPY DISCHARGE SUMMARY  Visits from Start of Care: 4  Current functional level related to goals / functional outcomes: See last note   Remaining deficits: See last note   Education / Equipment: n/a  Plan: Patient agrees to discharge.  Patient goals were not met. Patient is being discharged due to being pleased with the current functional level.  ?????      Geraldine Solar PT, [place] [address] [place], [place], [address] Phone: [phone]   Fax:  [phone]

## 2019-05-27 ENCOUNTER — Encounter: Payer: Self-pay | Admitting: Orthopaedic Surgery

## 2019-05-27 ENCOUNTER — Ambulatory Visit (INDEPENDENT_AMBULATORY_CARE_PROVIDER_SITE_OTHER): Payer: 59 | Admitting: Orthopaedic Surgery

## 2019-05-27 ENCOUNTER — Other Ambulatory Visit: Payer: Self-pay

## 2019-05-27 DIAGNOSIS — S8002XD Contusion of left knee, subsequent encounter: Secondary | ICD-10-CM

## 2019-05-27 DIAGNOSIS — S8000XA Contusion of unspecified knee, initial encounter: Secondary | ICD-10-CM | POA: Insufficient documentation

## 2019-05-27 DIAGNOSIS — S52122A Displaced fracture of head of left radius, initial encounter for closed fracture: Secondary | ICD-10-CM | POA: Insufficient documentation

## 2019-05-27 NOTE — Progress Notes (Signed)
Office Visit Note   Patient: Jennifer Coleman           Date of Birth: Jan 03, 1951           MRN: [id number] Visit Date: 05/27/2019              Requested by: Mikey Kirschner, [place],  [place] [address] PCP: Mikey Kirschner, MD   Assessment & Plan: Visit Diagnoses:  1. Traumatic hematoma of left knee, subsequent encounter     Plan: Nondisplaced left radial head fracture appears to be healing as [name] has full range of motion and minimal discomfort.  Hematoma about left knee is much smaller and benign.  We will continue with range of motion exercises and strengthening left upper extremity and use Voltaren gel and heat to the left knee  Follow-Up Instructions: Return if symptoms worsen or fail to improve.   Orders:  No orders of the defined types were placed in this encounter.  No orders of the defined types were placed in this encounter.     Procedures: No procedures performed   Clinical Data: No additional findings.   Subjective: Chief Complaint  Patient presents with  . Left Elbow - Follow-up  Patient presents today for a two week follow up on her left elbow fracture. She has discontinued her splint and has been working on range of motion. Her pain is a level 3 and not constant. Her left knee is a pain level of 4, and not constant.   HPI  Review of Systems   Objective: Vital Signs: BP 114/67   Pulse 83   Ht _0  (1.651 m)   Wt 235 lb (106.6 kg)   BMI 39.11 kg/m   Physical Exam Constitutional:      Appearance: She is well-developed.  Eyes:     Pupils: Pupils are equal, round, and reactive to light.  Pulmonary:     Effort: Pulmonary effort is normal.  Skin:    General: Skin is warm and dry.  Neurological:     Mental Status: She is alert and oriented to person, place, and time.  Psychiatric:        Behavior: Behavior normal.     Ortho Exam hematoma anterior aspect left knee is much smaller.  Its pink but not red and just a  little bit fluctuant.  Minimal discomfort.  Do not feel hem arthrosis or effusion.  No knee instability.  Full range of motion of left elbow in pronation supination flexion and extension.  No pain over the radial head which was noted to have a transverse fracture without displacement on prior films Specialty Comments:  No specialty comments available.  Imaging: No results found.   PMFS History: Patient Active Problem List   Diagnosis Date Noted  . Fracture of radial head, left, closed 05/27/2019  . Traumatic hematoma of knee 05/27/2019  . Postoperative anemia due to acute blood loss 11/24/2018  . Acute cholecystitis 11/20/2018  . Impingement syndrome of left shoulder 09/30/2018  . Unspecified rotator cuff tear or rupture of right shoulder, not specified as traumatic 09/30/2018  . Acromioclavicular joint arthritis 09/30/2018  . Sun-damaged skin 01/03/2018  . Morbid obesity ([place]) 11/05/2017  . Depression with anxiety 11/05/2017  . Nontraumatic incomplete tear of right rotator cuff 09/25/2016  . Foreign body of shoulder right  09/25/2016  . 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 Shoulder joint painful on movement, right 08/29/2016  . Epigastric pain  08/11/2015  . Chest pain, rule out acute myocardial infarction 08/11/2015  . Renal insufficiency 08/11/2015  . Venous stasis 04/13/2015  . Depression 04/13/2015  . Essential hypertension 09/12/2014  . PVCs (premature ventricular contractions) 09/07/2014  . Precordial pain 09/07/2014   Past Medical History:  Diagnosis Date  . Anxiety   . Arthritis   . Dyspnea   . Dysrhythmia   . Family history of anesthesia complication 25 yrs ago   Sister allergic to sccinocholine - unable to come off ventilator  . Headache    started last night , pain level  8  . Hypertension   . Pneumonia   . PONV (postoperative nausea and vomiting)   . Premature ventricular contractions   . Shingles   . Varicose veins   . Varicose veins of  left lower extremity     Family History  Problem Relation Age of Onset  . Heart disease Sister   . Heart disease Brother        89's  . Diabetes Mellitus II Sister   . Multiple myeloma Mother   . Hypertension Sister   . Hypertension Father   . Heart attack Father     Past Surgical History:  Procedure Laterality Date  . CARPAL TUNNEL RELEASE    . CERVICAL DISC SURGERY  1992   Fusion  . CHOLECYSTECTOMY N/A 11/21/2018   Procedure: LAPAROSCOPIC CHOLECYSTECTOMY;  Surgeon: Virl Cagey, MD;  [facility];  Service: General;  Laterality: N/A;  . COLONOSCOPY N/A 06/07/2016   Procedure: COLONOSCOPY;  Surgeon: Rogene Houston, MD;  [facility];  Service: Endoscopy;  Laterality: N/A;  1:00  . KNEE ARTHROSCOPY  09/16/2012   Procedure: ARTHROSCOPY KNEE;  Surgeon: [name], MD;  Location: [facility];  Service: Orthopedics;  Laterality: Right;  Right Knee Arthroscopy  . SALPINGOOPHORECTOMY Left 1985  . SHOULDER ARTHROSCOPY WITH OPEN ROTATOR CUFF REPAIR AND DISTAL CLAVICLE ACROMINECTOMY Right 05/03/2016   Procedure: RIGHT SHOULDER ARTHROSCOPY WITH MINI-OPEN ROTATOR CUFF REPAIR,DISTAL CLAVICLE RESECTION, SUBACROMIAL DECOMPRESSION.;  Surgeon: [name], MD;  Location: [facility];  Service: Orthopedics;  Laterality: Right;  . SHOULDER ARTHROSCOPY WITH ROTATOR CUFF REPAIR Right 09/25/2016   Procedure: Right Shoulder Manipulation, Arthroscopic Debridement of Joint, Removal of Loose Body and Re-Repair of Rotator Cuff;  Surgeon: [name], MD;  Location: [facility];  Service: Orthopedics;  Laterality: Right;   Social History   Occupational History  . Not on file  Tobacco Use  . Smoking status: Former Smoker    Types: Cigarettes    Quit date: 09/22/1992    Years since quitting: 26.6  . Smokeless tobacco: Never Used  Substance and Sexual Activity  . Alcohol use: No    Alcohol/week: 0.0 standard drinks  . Drug use: No  . Sexual activity: Yes

## 2019-06-09 ENCOUNTER — Other Ambulatory Visit (INDEPENDENT_AMBULATORY_CARE_PROVIDER_SITE_OTHER): Payer: Self-pay | Admitting: Orthopaedic Surgery

## 2019-06-09 NOTE — Telephone Encounter (Signed)
thanks

## 2019-06-09 NOTE — Telephone Encounter (Signed)
Ok to prescribe-let her know it is now OTC

## 2019-06-24 DIAGNOSIS — M2550 Pain in unspecified joint: Secondary | ICD-10-CM | POA: Diagnosis not present

## 2019-06-24 DIAGNOSIS — I10 Essential (primary) hypertension: Secondary | ICD-10-CM | POA: Diagnosis not present

## 2019-06-24 DIAGNOSIS — K219 Gastro-esophageal reflux disease without esophagitis: Secondary | ICD-10-CM | POA: Diagnosis not present

## 2019-06-25 ENCOUNTER — Ambulatory Visit (INDEPENDENT_AMBULATORY_CARE_PROVIDER_SITE_OTHER): Payer: 59 | Admitting: Psychiatry

## 2019-06-25 DIAGNOSIS — F509 Eating disorder, unspecified: Secondary | ICD-10-CM | POA: Diagnosis not present

## 2019-06-30 ENCOUNTER — Other Ambulatory Visit (HOSPITAL_COMMUNITY): Payer: Self-pay | Admitting: Surgery

## 2019-06-30 ENCOUNTER — Other Ambulatory Visit: Payer: Self-pay | Admitting: Surgery

## 2019-07-01 DIAGNOSIS — I10 Essential (primary) hypertension: Secondary | ICD-10-CM | POA: Diagnosis not present

## 2019-07-07 ENCOUNTER — Ambulatory Visit: Payer: 59 | Admitting: Family Medicine

## 2019-07-10 ENCOUNTER — Other Ambulatory Visit: Payer: Self-pay

## 2019-07-10 ENCOUNTER — Ambulatory Visit (HOSPITAL_COMMUNITY)
Admission: RE | Admit: 2019-07-10 | Discharge: 2019-07-10 | Disposition: A | Discharge status: Home / Self Care | Payer: 59 | Source: Ambulatory Visit | Attending: General Surgery | Admitting: General Surgery

## 2019-07-10 ENCOUNTER — Other Ambulatory Visit (HOSPITAL_COMMUNITY): Payer: Self-pay | Admitting: General Surgery

## 2019-07-10 DIAGNOSIS — Z01818 Encounter for other preprocedural examination: Secondary | ICD-10-CM | POA: Diagnosis not present

## 2019-07-10 DIAGNOSIS — K449 Diaphragmatic hernia without obstruction or gangrene: Secondary | ICD-10-CM | POA: Diagnosis not present

## 2019-07-20 ENCOUNTER — Other Ambulatory Visit: Payer: Self-pay

## 2019-07-20 ENCOUNTER — Encounter: Payer: 59 | Attending: Surgery | Admitting: Dietician

## 2019-07-20 ENCOUNTER — Encounter: Payer: Self-pay | Admitting: Dietician

## 2019-07-20 DIAGNOSIS — E669 Obesity, unspecified: Secondary | ICD-10-CM | POA: Diagnosis not present

## 2019-07-20 NOTE — Progress Notes (Signed)
Pre-Operative Nutrition Class   Appt Start Time: 8:15am     End Time: 10:00am   Patient was seen on 07/20/2019 for Pre-Operative Bariatric Surgery Education at [facility].    Surgery date: 08/17/2019 (tentative)  Surgery type: Sleeve Gastrectomy    Start weight at [facility]: 249 lbs (date: 11/11/2018) Weight today: 244 lbs BMI: 40.6 kg/m2   Samples Given per MNT Protocol (pt educated on appropriate usage) [place] Bariatric Multivitamin Lot [id number] Exp: May 2022   Bariatric Advantage Calcium  Lot #[id number] Exp: 06/24/2020   Protein2O Drink Lot #[id number] CCP 0043  Exp: 06/09/2020  [place] Protein Powder (Single Serve) Lot #[id number] Exp: March 2021    The following the learning objectives were met by the patient during this course:  Identify Pre-Op Dietary Goals and will begin 2 weeks pre-operatively  Identify appropriate sources of fluids and proteins   State protein recommendations and appropriate sources pre and post-operatively  Identify Post-Operative Dietary Goals and will follow for 2 weeks post-operatively  Identify appropriate multivitamin and calcium sources  Describe the need for physical activity post-operatively and will follow MD recommendations  State when to call healthcare provider regarding medication questions or post-operative complications   Handouts given during class include:  Pre-Op Bariatric Surgery Diet Handout  Protein Shake Handout  Post-Op Bariatric Surgery Nutrition Handout  [facility] Program Information Flyer  Support Group Information Flyer  [facility] Bariatric Supplements Price List   Follow-Up Plan: Patient will follow-up at [facility] 2 weeks post operatively for diet advancement per MD.

## 2019-07-29 NOTE — H&P (Signed)
Chief Complaint:  Morbid obesity  BMI 41  History of Present Illness:  Jennifer Coleman is an 68 y.o. female who initially saw Dr. Excell Seltzer and then me about sleeve gastrectomy.  She has GER and a small hiatal hernia which we will look at and repair at the time of her surgery.  Her questions have been answered and she is ready for surgery on Oct 19th.    Past Medical History:  Diagnosis Date  . Anxiety   . Arthritis   . Dyspnea   . Dysrhythmia   . Family history of anesthesia complication 25 yrs ago   Sister allergic to sccinocholine - unable to come off ventilator  . Headache    started last night , pain level  8  . Hypertension   . Pneumonia   . PONV (postoperative nausea and vomiting)   . Premature ventricular contractions   . Shingles   . Varicose veins   . Varicose veins of left lower extremity     Past Surgical History:  Procedure Laterality Date  . CARPAL TUNNEL RELEASE    . CERVICAL DISC SURGERY  1992   Fusion  . CHOLECYSTECTOMY N/A 11/21/2018   Procedure: LAPAROSCOPIC CHOLECYSTECTOMY;  Surgeon: Virl Cagey, MD;  [facility];  Service: General;  Laterality: N/A;  . COLONOSCOPY N/A 06/07/2016   Procedure: COLONOSCOPY;  Surgeon: Rogene Houston, MD;  [facility];  Service: Endoscopy;  Laterality: N/A;  1:00  . KNEE ARTHROSCOPY  09/16/2012   Procedure: ARTHROSCOPY KNEE;  Surgeon: [name], MD;  Location: [facility];  Service: Orthopedics;  Laterality: Right;  Right Knee Arthroscopy  . SALPINGOOPHORECTOMY Left 1985  . SHOULDER ARTHROSCOPY WITH OPEN ROTATOR CUFF REPAIR AND DISTAL CLAVICLE ACROMINECTOMY Right 05/03/2016   Procedure: RIGHT SHOULDER ARTHROSCOPY WITH MINI-OPEN ROTATOR CUFF REPAIR,DISTAL CLAVICLE RESECTION, SUBACROMIAL DECOMPRESSION.;  Surgeon: [name], MD;  Location: [facility];  Service: Orthopedics;  Laterality: Right;  . SHOULDER ARTHROSCOPY WITH ROTATOR CUFF REPAIR Right 09/25/2016   Procedure: Right Shoulder  Manipulation, Arthroscopic Debridement of Joint, Removal of Loose Body and Re-Repair of Rotator Cuff;  Surgeon: [name], MD;  Location: [facility];  Service: Orthopedics;  Laterality: Right;    No current facility-administered medications for this encounter.    Current Outpatient Medications  Medication Sig Dispense Refill  . Calcium Carbonate-Vit D-Min (GNP CALCIUM PLUS 600 +D PO) Take 1 tablet by mouth daily.     . cetirizine (ZYRTEC) 10 MG tablet Take 10 mg by mouth daily as needed for allergies.     Marland Kitchen diclofenac sodium (VOLTAREN) 1 % GEL APPLY TWO GRAMS TO SHOULDER UP TO FOUR TIMES DAILY 100 g 2  . docusate sodium (COLACE) 100 MG capsule Take 1 capsule (100 mg total) by mouth 2 (two) times daily. 10 capsule 0  . enalapril (VASOTEC) 20 MG tablet Take 1 tablet (20 mg total) by mouth 2 (two) times daily. 180 tablet 1  . escitalopram (LEXAPRO) 20 MG tablet Take 1 tablet (20 mg total) by mouth daily. 90 tablet 1  . fluticasone (FLONASE) 50 MCG/ACT nasal spray PLACE 2 SPRAYS INTO BOTH NOSTRILS DAILY. 16 g 0  . hydrochlorothiazide (HYDRODIURIL) 25 MG tablet Take by mouth as needed.    Marland Kitchen ibuprofen (ADVIL) 800 MG tablet Take 1 tablet (800 mg total) by mouth every 8 (eight) hours as needed. 36 tablet 2  . Multiple Vitamins-Minerals (ONE-A-DAY WOMENS 50 PLUS PO) Take 1 tablet by mouth daily.    Marland Kitchen  PROAIR HFA 108 (90 Base) MCG/ACT inhaler INHALE TWO PUFFS BY MOUTH EVERY 4 TO 6 HOURS AS NEEDED FOR FOR WHEEZING (Patient taking differently: Inhale 1-2 puffs into the lungs every 4 (four) hours as needed for wheezing or shortness of breath. ) 1 Inhaler 0   Penicillins, Shellfish allergy, Acyclovir and related, Codeine, and Morphine and related Family History  Problem Relation Age of Onset  . Heart disease Sister   . Heart disease Brother        50's  . Diabetes Mellitus II Sister   . Multiple myeloma Mother   . Hypertension Sister   . Hypertension Father   . Heart attack Father    Social  History:   reports that she quit smoking about 26 years ago. Her smoking use included cigarettes. She has never used smokeless tobacco. She reports that she does not drink alcohol or use drugs.   REVIEW OF SYSTEMS : Negative except for see problem list  Physical Exam:   There were no vitals taken for this visit. There is no height or weight on file to calculate BMI.  Gen:  WDWN WF NAD  Neurological: Alert and oriented to person, place, and time. Motor and sensory function is grossly intact  Head: Normocephalic and atraumatic.  Eyes: Conjunctivae are normal. Pupils are equal, round, and reactive to light. No scleral icterus.  Neck: Normal range of motion. Neck supple. No tracheal deviation or thyromegaly present.  Cardiovascular:  SR without murmurs or gallops.  No carotid bruits Breast:  Not examined Respiratory: Effort normal.  No respiratory distress. No chest wall tenderness. Breath sounds normal.  No wheezes, rales or rhonchi.  Abdomen:  Prior lap chole this year GU:  Not examined Musculoskeletal: Normal range of motion. Extremities are nontender. No cyanosis, edema or clubbing noted Lymphadenopathy: No cervical, preauricular, postauricular or axillary adenopathy is present Skin: Skin is warm and dry. No rash noted. No diaphoresis. No erythema. No pallor. Pscyh: Normal mood and affect. Behavior is normal. Judgment and thought content normal.   LABORATORY RESULTS: No results found for this or any previous visit (from the past 48 hour(s)).   RADIOLOGY RESULTS: No results found.  Problem List: Patient Active Problem List   Diagnosis Date Noted  . Fracture of radial head, left, closed 05/27/2019  . Traumatic hematoma of knee 05/27/2019  . Postoperative anemia due to acute blood loss 11/24/2018  . Acute cholecystitis 11/20/2018  . Impingement syndrome of left shoulder 09/30/2018  . Unspecified rotator cuff tear or rupture of right shoulder, not specified as traumatic 09/30/2018   . Acromioclavicular joint arthritis 09/30/2018  . Sun-damaged skin 01/03/2018  . Morbid obesity ([place]) 11/05/2017  . Depression with anxiety 11/05/2017  . Nontraumatic incomplete tear of right rotator cuff 09/25/2016  . Foreign body of shoulder right  09/25/2016  . 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 Shoulder joint painful on movement, right 08/29/2016  . Epigastric pain 08/11/2015  . Chest pain, rule out acute myocardial infarction 08/11/2015  . Renal insufficiency 08/11/2015  . Venous stasis 04/13/2015  . Depression 04/13/2015  . Essential hypertension 09/12/2014  . PVCs (premature ventricular contractions) 09/07/2014  . Precordial pain 09/07/2014    Assessment & Plan: Morbid obesity with BMI 41 for lap sleeve gastrectomy.      Matt B. Hassell Done, MD, [facility] [phone] beeper [phone]  07/29/2019 4:53 PM

## 2019-07-30 ENCOUNTER — Telehealth: Payer: Self-pay | Admitting: Orthopaedic Surgery

## 2019-07-30 NOTE — Telephone Encounter (Signed)
Patient called inquiring if we received request from [place]. I told her we had and it was processed by [employer]. She said the adjuster from [facility] told her they hadnt received records and I advised her they need to followup with [employer] and I gave her their number.

## 2019-08-07 MED FILL — ENALAPRIL MALEATE 20 MG TAB: 20 | 90 days supply | Qty: 180 | Fill #1

## 2019-08-07 MED FILL — ESCITALOPRAM 20 MG TABLET: 20 | 90 days supply | Qty: 90 | Fill #1

## 2019-08-11 ENCOUNTER — Other Ambulatory Visit (HOSPITAL_COMMUNITY): Payer: Self-pay | Admitting: Family Medicine

## 2019-08-11 DIAGNOSIS — Z1231 Encounter for screening mammogram for malignant neoplasm of breast: Secondary | ICD-10-CM

## 2019-08-12 NOTE — Progress Notes (Signed)
PCP - [facility] Cardiologist -   Chest x-ray - 11-27-18 epic EKG - 11-27-18 epic Stress Test -  ECHO -  Cardiac Cath -   Sleep Study -  CPAP -   Fasting Blood Sugar -  Checks Blood Sugar _____ times a day  Blood Thinner Instructions: Aspirin Instructions: Last Dose:  Anesthesia review:   Patient denies shortness of breath, fever, cough and chest pain at PAT appointment   Patient verbalized understanding of instructions that were given to them at the PAT appointment. Patient was also instructed that they will need to review over the PAT instructions again at home before surgery.

## 2019-08-12 NOTE — Patient Instructions (Signed)
DUE TO COVID-19 ONLY ONE VISITOR IS ALLOWED TO COME WITH YOU AND STAY IN THE WAITING ROOM ONLY DURING PRE OP AND PROCEDURE DAY OF SURGERY. THE 1 VISITOR MAY VISIT WITH YOU AFTER SURGERY IN YOUR PRIVATE ROOM DURING VISITING HOURS ONLY!  YOU NEED TO HAVE A COVID 19 TEST ON_______ @_______ , THIS TEST MUST BE DONE BEFORE SURGERY, COME  Jennifer Coleman , [id number].  ([place]) ONCE YOUR COVID TEST IS COMPLETED, PLEASE BEGIN THE QUARANTINE INSTRUCTIONS AS OUTLINED IN YOUR HANDOUT.                Jennifer Coleman  08/12/2019   Your procedure is scheduled on: 08-17-19   Report to [facility] Main  Entrance   Report to admitting at     1055  AM     Call this number if you have problems the morning of surgery [phone]    Remember: MORNING OF SURGERY DRINK:   DRINK 1 G2 drink BEFORE YOU LEAVE HOME, DRINK ALL OF THE  G2 DRINK AT ONE TIME.   NO SOLID FOOD AFTER 600 PM THE NIGHT BEFORE YOUR SURGERY. YOU MAY DRINK CLEAR FLUIDS. THE G2 DRINK YOU DRINK BEFORE YOU LEAVE HOME WILL BE THE LAST FLUIDS YOU DRINK BEFORE SURGERY.  PAIN IS EXPECTED AFTER SURGERY AND WILL NOT BE COMPLETELY ELIMINATED. AMBULATION AND TYLENOL WILL HELP REDUCE INCISIONAL AND GAS PAIN. MOVEMENT IS KEY!  YOU ARE EXPECTED TO BE OUT OF BED WITHIN 4 HOURS OF ADMISSION TO YOUR PATIENT ROOM.  SITTING IN THE RECLINER THROUGHOUT THE DAY IS IMPORTANT FOR DRINKING FLUIDS AND MOVING GAS THROUGHOUT THE GI TRACT.  COMPRESSION STOCKINGS SHOULD BE WORN [place] UNLESS YOU ARE WALKING.   INCENTIVE SPIROMETER SHOULD BE USED EVERY HOUR WHILE AWAKE TO DECREASE POST-OPERATIVE COMPLICATIONS SUCH AS PNEUMONIA.  WHEN DISCHARGED HOME, IT IS IMPORTANT TO CONTINUE TO WALK EVERY HOUR AND USE THE INCENTIVE SPIROMETER EVERY HOUR.       CLEAR LIQUID DIET   Foods Allowed                                                                     Foods Excluded  Coffee and tea, regular and decaf                                liquids that you cannot  Plain Jell-O any favor except red or purple                                           see through such as: Fruit ices (not with fruit pulp)                                         milk, soups, orange juice  Iced Popsicles  All solid food                                  Cranberry, grape and apple juices Sports drinks like Gatorade Lightly seasoned clear broth or consume(fat free) Sugar, honey syrup  Sample Menu Breakfast                                Lunch                                     Supper Cranberry juice                    Beef broth                            Chicken broth Jell-O                                     Grape juice                           Apple juice Coffee or tea                        Jell-O                                      Popsicle                                                Coffee or tea                        Coffee or tea    BRUSH YOUR TEETH MORNING OF SURGERY AND RINSE YOUR MOUTH OUT, NO CHEWING GUM CANDY OR MINTS.     Take these medicines the morning of surgery with A SIP OF WATER: lexapro,  Zyrtec, inhaler and bring with you                                 You may not have any metal on your body including hair pins and              piercings  Do not wear jewelry, make-up, lotions, powders or perfumes, deodorant             Do not wear nail polish on your fingernails.  Do not shave  48 hours prior to surgery.     Do not bring valuables to the hospital. [place].  Contacts, dentures or bridgework may not be worn into surgery.                Please read over the following fact sheets you were given:  _____________________________________________________________________          [facility] - Preparing for Surgery Before surgery, you can play an important role.  Because skin is not sterile, your skin  needs to be as free of germs as possible.  You can reduce the number of germs on your skin by washing with CHG (chlorahexidine gluconate) soap before surgery.  CHG is an antiseptic cleaner which kills germs and bonds with the skin to continue killing germs even after washing. Please DO NOT use if you have an allergy to CHG or antibacterial soaps.  If your skin becomes reddened/irritated stop using the CHG and inform your nurse when you arrive at Short Stay. Do not shave (including legs and underarms) for at least 48 hours prior to the first CHG shower.  You may shave your face/neck. Please follow these instructions carefully:  1.  Shower with CHG Soap the night before surgery and the  morning of Surgery.  2.  If you choose to wash your hair, wash your hair first as usual with your  normal  shampoo.  3.  After you shampoo, rinse your hair and body thoroughly to remove the  shampoo.                           4.  Use CHG as you would any other liquid soap.  You can apply chg directly  to the skin and wash                       Gently with a scrungie or clean washcloth.  5.  Apply the CHG Soap to your body ONLY FROM THE NECK DOWN.   Do not use on face/ open                           Wound or open sores. Avoid contact with eyes, ears mouth and genitals (private parts).                       Wash face,  Genitals (private parts) with your normal soap.             6.  Wash thoroughly, paying special attention to the area where your surgery  will be performed.  7.  Thoroughly rinse your body with warm water from the neck down.  8.  DO NOT shower/wash with your normal soap after using and rinsing off  the CHG Soap.                9.  Pat yourself dry with a clean towel.            10.  Wear clean pajamas.            11.  Place clean sheets on your bed the night of your first shower and do not  sleep with pets. Day of Surgery : Do not apply any lotions/deodorants the morning of surgery.  Please wear clean  clothes to the hospital/surgery center.  FAILURE TO FOLLOW THESE INSTRUCTIONS MAY RESULT IN THE CANCELLATION OF YOUR SURGERY PATIENT SIGNATURE_________________________________  NURSE SIGNATURE__________________________________  ________________________________________________________________________   Jennifer Coleman  An incentive spirometer is a tool that can help keep your lungs clear and active. This tool measures how well you are filling your lungs with each breath. Taking long deep breaths may help reverse or decrease the chance of developing breathing (  pulmonary) problems (especially infection) following:  A long period of time when you are unable to move or be active. BEFORE THE PROCEDURE   If the spirometer includes an indicator to show your best effort, your nurse or respiratory therapist will set it to a desired goal.  If possible, sit up straight or lean slightly forward. Try not to slouch.  Hold the incentive spirometer in an upright position. INSTRUCTIONS FOR USE  1. Sit on the edge of your bed if possible, or sit up as far as you can in bed or on a chair. 2. Hold the incentive spirometer in an upright position. 3. Breathe out normally. 4. Place the mouthpiece in your mouth and seal your lips tightly around it. 5. Breathe in slowly and as deeply as possible, raising the piston or the ball toward the top of the column. 6. Hold your breath for 3-5 seconds or for as long as possible. Allow the piston or ball to fall to the bottom of the column. 7. Remove the mouthpiece from your mouth and breathe out normally. 8. Rest for a few seconds and repeat Steps 1 through 7 at least 10 times every 1-2 hours when you are awake. Take your time and take a few normal breaths between deep breaths. 9. The spirometer may include an indicator to show your best effort. Use the indicator as a goal to work toward during each repetition. 10. After each set of 10 deep breaths, practice  coughing to be sure your lungs are clear. If you have an incision (the cut made at the time of surgery), support your incision when coughing by placing a pillow or rolled up towels firmly against it. Once you are able to get out of bed, walk around indoors and cough well. You may stop using the incentive spirometer when instructed by your caregiver.  RISKS AND COMPLICATIONS  Take your time so you do not get dizzy or light-headed.  If you are in pain, you may need to take or ask for pain medication before doing incentive spirometry. It is harder to take a deep breath if you are having pain. AFTER USE  Rest and breathe slowly and easily.  It can be helpful to keep track of a log of your progress. Your caregiver can provide you with a simple table to help with this. If you are using the spirometer at home, follow these instructions: [place] IF:   You are having difficultly using the spirometer.  You have trouble using the spirometer as often as instructed.  Your pain medication is not giving enough relief while using the spirometer.  You develop fever of 100.5 F (38.1 C) or higher. SEEK IMMEDIATE MEDICAL CARE IF:   You cough up bloody sputum that had not been present before.  You develop fever of 102 F (38.9 C) or greater.  You develop worsening pain at or near the incision site. MAKE SURE YOU:   Understand these instructions.  Will watch your condition.  Will get help right away if you are not doing well or get worse. Document Released: 02/25/2007 Document Revised: 01/07/2012 Document Reviewed: 04/28/2007 ExitCare Patient Information 2014 Exit[place], [place].   ________________________________________________________________________  WHAT IS A BLOOD TRANSFUSION? Blood Transfusion Information  A transfusion is the replacement of blood or some of its parts. Blood is made up of multiple cells which provide different functions.  Red blood cells carry oxygen and are  used for blood loss replacement.  White blood cells fight against  infection.  Platelets control bleeding.  Plasma helps clot blood.  Other blood products are available for specialized needs, such as hemophilia or other clotting disorders. BEFORE THE TRANSFUSION  Who gives blood for transfusions?   Healthy volunteers who are fully evaluated to make sure their blood is safe. This is blood bank blood. Transfusion therapy is the safest it has ever been in the practice of medicine. Before blood is taken from a donor, a complete history is taken to make sure that person has no history of diseases nor engages in risky social behavior (examples are intravenous drug use or sexual activity with multiple partners). The donor's travel history is screened to minimize risk of transmitting infections, such as malaria. The donated blood is tested for signs of infectious diseases, such as HIV and hepatitis. The blood is then tested to be sure it is compatible with you in order to minimize the chance of a transfusion reaction. If you or a relative donates blood, this is often done in anticipation of surgery and is not appropriate for emergency situations. It takes many days to process the donated blood. RISKS AND COMPLICATIONS Although transfusion therapy is very safe and saves many lives, the main dangers of transfusion include:   Getting an infectious disease.  Developing a transfusion reaction. This is an allergic reaction to something in the blood you were given. Every precaution is taken to prevent this. The decision to have a blood transfusion has been considered carefully by your caregiver before blood is given. Blood is not given unless the benefits outweigh the risks. AFTER THE TRANSFUSION  Right after receiving a blood transfusion, you will usually feel much better and more energetic. This is especially true if your red blood cells have gotten low (anemic). The transfusion raises the level of the red  blood cells which carry oxygen, and this usually causes an energy increase.  The nurse administering the transfusion will monitor you carefully for complications. HOME CARE INSTRUCTIONS  No special instructions are needed after a transfusion. You may find your energy is better. Speak with your caregiver about any limitations on activity for underlying diseases you may have. SEEK MEDICAL CARE IF:   Your condition is not improving after your transfusion.  You develop redness or irritation at the intravenous (IV) site. SEEK IMMEDIATE MEDICAL CARE IF:  Any of the following symptoms occur over the next 12 hours:  Shaking chills.  You have a temperature by mouth above 102 F (38.9 C), not controlled by medicine.  Chest, back, or muscle pain.  People around you feel you are not acting correctly or are confused.  Shortness of breath or difficulty breathing.  Dizziness and fainting.  You get a rash or develop hives.  You have a decrease in urine output.  Your urine turns a dark color or changes to pink, red, or brown. Any of the following symptoms occur over the next 10 days:  You have a temperature by mouth above 102 F (38.9 C), not controlled by medicine.  Shortness of breath.  Weakness after normal activity.  The white part of the eye turns yellow (jaundice).  You have a decrease in the amount of urine or are urinating less often.  Your urine turns a dark color or changes to pink, red, or brown. Document Released: 10/12/2000 Document Revised: 01/07/2012 Document Reviewed: 05/31/2008 [facility] Patient Information 2014 [place], [place].  _______________________________________________________________________

## 2019-08-13 ENCOUNTER — Encounter (HOSPITAL_COMMUNITY): Payer: Self-pay

## 2019-08-13 ENCOUNTER — Encounter (HOSPITAL_COMMUNITY)
Admission: RE | Admit: 2019-08-13 | Discharge: 2019-08-13 | Disposition: A | Discharge status: Home / Self Care | Payer: 59 | Source: Ambulatory Visit | Attending: Surgery | Admitting: Surgery

## 2019-08-13 ENCOUNTER — Other Ambulatory Visit: Payer: Self-pay

## 2019-08-13 ENCOUNTER — Encounter (HOSPITAL_COMMUNITY): Payer: 59

## 2019-08-13 ENCOUNTER — Other Ambulatory Visit (HOSPITAL_COMMUNITY)
Admission: RE | Admit: 2019-08-13 | Discharge: 2019-08-13 | Disposition: A | Discharge status: Home / Self Care | Payer: 59 | Source: Ambulatory Visit | Attending: Surgery | Admitting: Surgery

## 2019-08-13 DIAGNOSIS — Z6841 Body Mass Index (BMI) 40.0 and over, adult: Secondary | ICD-10-CM | POA: Insufficient documentation

## 2019-08-13 DIAGNOSIS — Z20828 Contact with and (suspected) exposure to other viral communicable diseases: Secondary | ICD-10-CM | POA: Diagnosis not present

## 2019-08-13 DIAGNOSIS — F419 Anxiety disorder, unspecified: Secondary | ICD-10-CM | POA: Insufficient documentation

## 2019-08-13 DIAGNOSIS — I10 Essential (primary) hypertension: Secondary | ICD-10-CM | POA: Insufficient documentation

## 2019-08-13 DIAGNOSIS — Z79899 Other long term (current) drug therapy: Secondary | ICD-10-CM | POA: Insufficient documentation

## 2019-08-13 DIAGNOSIS — Z87891 Personal history of nicotine dependence: Secondary | ICD-10-CM | POA: Diagnosis not present

## 2019-08-13 DIAGNOSIS — Z01812 Encounter for preprocedural laboratory examination: Secondary | ICD-10-CM | POA: Insufficient documentation

## 2019-08-13 LAB — CBC WITH DIFFERENTIAL/PLATELET
Abs Immature Granulocytes: 0.02 10*3/uL (ref 0.00–0.07)
Basophils Absolute: 0 10*3/uL (ref 0.0–0.1)
Basophils Relative: 1 %
Eosinophils Absolute: 0.1 10*3/uL (ref 0.0–0.5)
Eosinophils Relative: 1 %
HCT: 42.4 % (ref 36.0–46.0)
Hemoglobin: 13.6 g/dL (ref 12.0–15.0)
Immature Granulocytes: 0 %
Lymphocytes Relative: 33 %
Lymphs Abs: 2.6 10*3/uL (ref 0.7–4.0)
MCH: 31.5 pg (ref 26.0–34.0)
MCHC: 32.1 g/dL (ref 30.0–36.0)
MCV: 98.1 fL (ref 80.0–100.0)
Monocytes Absolute: 0.6 10*3/uL (ref 0.1–1.0)
Monocytes Relative: 8 %
Neutro Abs: 4.6 10*3/uL (ref 1.7–7.7)
Neutrophils Relative %: 57 %
Platelets: 266 10*3/uL (ref 150–400)
RBC: 4.32 MIL/uL (ref 3.87–5.11)
RDW: 12.2 % (ref 11.5–15.5)
WBC: 8 10*3/uL (ref 4.0–10.5)
nRBC: 0 % (ref 0.0–0.2)

## 2019-08-13 LAB — COMPREHENSIVE METABOLIC PANEL
ALT: 13 U/L (ref 0–44)
AST: 21 U/L (ref 15–41)
Albumin: 3.8 g/dL (ref 3.5–5.0)
Alkaline Phosphatase: 97 U/L (ref 38–126)
Anion gap: 9 (ref 5–15)
BUN: 29 mg/dL — ABNORMAL HIGH (ref 8–23)
CO2: 26 mmol/L (ref 22–32)
Calcium: 9.9 mg/dL (ref 8.9–10.3)
Chloride: 106 mmol/L (ref 98–111)
Creatinine, Ser: 1.03 mg/dL — ABNORMAL HIGH (ref 0.44–1.00)
GFR calc Af Amer: >60 mL/min (ref >60)
GFR calc non Af Amer: 57 mL/min — ABNORMAL LOW (ref >60)
Glucose, Bld: 88 mg/dL (ref 70–99)
Potassium: 4.3 mmol/L (ref 3.5–5.1)
Sodium: 141 mmol/L (ref 135–145)
Total Bilirubin: 0.8 mg/dL (ref 0.3–1.2)
Total Protein: 6.9 g/dL (ref 6.5–8.1)

## 2019-08-14 LAB — ABO/RH: ABO/RH(D): O POS

## 2019-08-14 NOTE — Progress Notes (Signed)
Anesthesia Chart Review   Case: [id number] Date/Time: 08/17/19 1240   Procedure: LAPAROSCOPIC GASTRIC SLEEVE RESECTION AND HIATAL HERNIA REPAIR, UPPPER ENDO AND ERAS PATHWAY (N/A )   Anesthesia type: General   Pre-op diagnosis: MORBID OBESITY   Location: [place] 02 / [facility]   Surgeon: Johnathan Hausen, MD      DISCUSSION:68 y.o. former smoker (quit 09/22/92) with h/o PONV, HTN, PVC, morbid obesity scheduled for above procedure 08/17/2019 with Dr. Johnathan Hausen.   Pt reports her sister has an allergy to succinylcholine "unable to come off ventilator".  In review of anesthesia records succinylcholine used, no reactions or anesthesia complications noted.   Anticipate pt can proceed with planned procedure barring acute status change.   VS: BP (!) 157/73   Pulse 69   Temp 37 C   Resp 16   Ht 5\' 5"  (1.651 m)   Wt 111.8 kg   SpO2 100%   BMI 41.00 kg/m   PROVIDERS: Mikey Kirschner, MD is PCP    LABS: Labs reviewed: Acceptable for surgery. (all labs ordered are listed, but only abnormal results are displayed)  Labs Reviewed  COMPREHENSIVE METABOLIC PANEL - Abnormal; Notable for the following components:      Result Value   BUN 29 (*)    Creatinine, Ser 1.03 (*)    GFR calc non Af Amer 57 (*)    All other components within normal limits  CBC WITH DIFFERENTIAL/PLATELET  TYPE AND SCREEN  ABO/RH     IMAGES:   EKG: 11/27/2018 Rate 64 bpm Normal sinus rhythm Right bundle branch block Left atrial enlargement Abnormal ECG No significant change since last tracing  CV: Echo 09/29/2015 Study Conclusions  - Procedure narrative: Transthoracic echocardiography for left   ventricular function evaluation, for right ventricular function   evaluation, and for assessment of valvular function. Image   quality was suboptimal. The study was technically difficult, as a   result of body habitus. - Left ventricle: The cavity size was normal. Wall thickness was   normal. Systolic  function was normal. The estimated ejection   fraction was in the range of 55% to 60%. Images were inadequate   for LV wall motion assessment. No gross regional variation on   apical 4-chamber views. Left ventricular diastolic function   parameters were normal. - Mitral valve: Mildly calcified annulus.  Myocardial Perfusion 09/08/2014 Overall impression, normal stress test  Past Medical History:  Diagnosis Date  . Anxiety   . Arthritis   . Dysrhythmia    PVCs  . Family history of anesthesia complication 25 yrs ago   Sister allergic to sccinocholine - unable to come off ventilator  . Hypertension   . Pneumonia   . PONV (postoperative nausea and vomiting)   . Premature ventricular contractions   . Shingles   . Varicose veins   . Varicose veins of left lower extremity     Past Surgical History:  Procedure Laterality Date  . CARPAL TUNNEL RELEASE    . CERVICAL DISC SURGERY  1992   Fusion  . CHOLECYSTECTOMY N/A 11/21/2018   Procedure: LAPAROSCOPIC CHOLECYSTECTOMY;  Surgeon: Virl Cagey, MD;  Location: [facility];  Service: General;  Laterality: N/A;  . COLONOSCOPY N/A 06/07/2016   Procedure: COLONOSCOPY;  Surgeon: Rogene Houston, MD;  Location[facility];  Service: Endoscopy;  Laterality: N/A;  1:00  . KNEE ARTHROSCOPY  09/16/2012   Procedure: ARTHROSCOPY KNEE;  Surgeon: [name], MD;  Location: [place];  Service: Orthopedics;  Laterality: Right;  Right Knee Arthroscopy  . SALPINGOOPHORECTOMY Left 1985  . SHOULDER ARTHROSCOPY WITH OPEN ROTATOR CUFF REPAIR AND DISTAL CLAVICLE ACROMINECTOMY Right 05/03/2016   Procedure: RIGHT SHOULDER ARTHROSCOPY WITH MINI-OPEN ROTATOR CUFF REPAIR,DISTAL CLAVICLE RESECTION, SUBACROMIAL DECOMPRESSION.;  Surgeon: [name], MD;  Location: [place];  Service: Orthopedics;  Laterality: Right;  . SHOULDER ARTHROSCOPY WITH ROTATOR CUFF REPAIR Right 09/25/2016   Procedure: Right Shoulder Manipulation, Arthroscopic  Debridement of Joint, Removal of Loose Body and Re-Repair of Rotator Cuff;  Surgeon: [name], MD;  Location: [place];  Service: Orthopedics;  Laterality: Right;    MEDICATIONS: . b complex vitamins tablet  . Calcium Carbonate-Vit D-Min (GNP CALCIUM PLUS 600 +D PO)  . cetirizine (ZYRTEC) 10 MG tablet  . diclofenac sodium (VOLTAREN) 1 % GEL  . docusate sodium (COLACE) 100 MG capsule  . enalapril (VASOTEC) 20 MG tablet  . escitalopram (LEXAPRO) 20 MG tablet  . fluticasone (FLONASE) 50 MCG/ACT nasal spray  . hydrochlorothiazide (HYDRODIURIL) 25 MG tablet  . ibuprofen (ADVIL) 800 MG tablet  . Multiple Vitamins-Minerals (ONE-A-DAY WOMENS 50 PLUS PO)  . PROAIR HFA 108 (90 Base) MCG/ACT inhaler   No current facility-administered medications for this encounter.       Maia Plan [facility] Pre-Surgical Testing [phone] 08/14/19  1:26 PM

## 2019-08-15 LAB — NOVEL CORONAVIRUS, NAA (HOSP ORDER, SEND-OUT TO REF LAB; TAT 18-24 HRS): SARS-CoV-2, NAA: NOT DETECTED

## 2019-08-16 MED ORDER — BUPIVACAINE LIPOSOME 1.3 % IJ SUSP
20.0000 mL | Freq: Once | INTRAMUSCULAR | Status: DC
  Filled 2019-08-16: qty 20

## 2019-08-17 ENCOUNTER — Encounter (HOSPITAL_COMMUNITY): Payer: Self-pay | Admitting: Anesthesiology

## 2019-08-17 ENCOUNTER — Inpatient Hospital Stay (HOSPITAL_COMMUNITY)
Admission: RE | Admit: 2019-08-17 | Discharge: 2019-08-18 | DRG: 621 | Disposition: A | Discharge status: Home / Self Care | Payer: 59 | Source: Ambulatory Visit | Attending: Surgery | Admitting: Surgery

## 2019-08-17 ENCOUNTER — Encounter (HOSPITAL_COMMUNITY)
Admission: RE | Disposition: A | Discharge status: Home / Self Care | Payer: Self-pay | Source: Ambulatory Visit | Attending: Surgery

## 2019-08-17 ENCOUNTER — Inpatient Hospital Stay (HOSPITAL_COMMUNITY): Payer: 59 | Admitting: Physician Assistant

## 2019-08-17 ENCOUNTER — Other Ambulatory Visit: Payer: Self-pay

## 2019-08-17 ENCOUNTER — Inpatient Hospital Stay (HOSPITAL_COMMUNITY): Payer: 59 | Admitting: Certified Registered Nurse Anesthetist

## 2019-08-17 DIAGNOSIS — I10 Essential (primary) hypertension: Secondary | ICD-10-CM | POA: Diagnosis present

## 2019-08-17 DIAGNOSIS — K449 Diaphragmatic hernia without obstruction or gangrene: Secondary | ICD-10-CM | POA: Diagnosis not present

## 2019-08-17 DIAGNOSIS — Z23 Encounter for immunization: Secondary | ICD-10-CM | POA: Diagnosis not present

## 2019-08-17 DIAGNOSIS — I8392 Asymptomatic varicose veins of left lower extremity: Secondary | ICD-10-CM | POA: Diagnosis present

## 2019-08-17 DIAGNOSIS — Z87891 Personal history of nicotine dependence: Secondary | ICD-10-CM | POA: Diagnosis not present

## 2019-08-17 DIAGNOSIS — Z6839 Body mass index (BMI) 39.0-39.9, adult: Secondary | ICD-10-CM | POA: Diagnosis not present

## 2019-08-17 DIAGNOSIS — K219 Gastro-esophageal reflux disease without esophagitis: Secondary | ICD-10-CM | POA: Diagnosis present

## 2019-08-17 DIAGNOSIS — E669 Obesity, unspecified: Secondary | ICD-10-CM | POA: Diagnosis not present

## 2019-08-17 DIAGNOSIS — Z6841 Body Mass Index (BMI) 40.0 and over, adult: Secondary | ICD-10-CM | POA: Diagnosis not present

## 2019-08-17 DIAGNOSIS — F418 Other specified anxiety disorders: Secondary | ICD-10-CM | POA: Diagnosis present

## 2019-08-17 DIAGNOSIS — Z9884 Bariatric surgery status: Secondary | ICD-10-CM

## 2019-08-17 DIAGNOSIS — M1990 Unspecified osteoarthritis, unspecified site: Secondary | ICD-10-CM | POA: Diagnosis present

## 2019-08-17 DIAGNOSIS — Z79899 Other long term (current) drug therapy: Secondary | ICD-10-CM | POA: Diagnosis not present

## 2019-08-17 HISTORY — PX: LAPAROSCOPIC GASTRIC SLEEVE RESECTION: SHX5895

## 2019-08-17 LAB — CBC
HCT: 45.4 % (ref 36.0–46.0)
Hemoglobin: 15.1 g/dL — ABNORMAL HIGH (ref 12.0–15.0)
MCH: 31.6 pg (ref 26.0–34.0)
MCHC: 33.3 g/dL (ref 30.0–36.0)
MCV: 95 fL (ref 80.0–100.0)
Platelets: 251 10*3/uL (ref 150–400)
RBC: 4.78 MIL/uL (ref 3.87–5.11)
RDW: 11.8 % (ref 11.5–15.5)
WBC: 14 10*3/uL — ABNORMAL HIGH (ref 4.0–10.5)
nRBC: 0 % (ref 0.0–0.2)

## 2019-08-17 LAB — CREATININE, SERUM
Creatinine, Ser: 0.93 mg/dL (ref 0.44–1.00)
GFR calc Af Amer: >60 mL/min (ref >60)
GFR calc non Af Amer: >60 mL/min (ref >60)

## 2019-08-17 LAB — TYPE AND SCREEN
ABO/RH(D): O POS
Antibody Screen: NEGATIVE

## 2019-08-17 SURGERY — GASTRECTOMY, SLEEVE, LAPAROSCOPIC
Anesthesia: General | Site: Abdomen

## 2019-08-17 MED ORDER — ONDANSETRON HCL 4 MG/2ML IJ SOLN
4.0000 mg | Freq: Once | INTRAMUSCULAR | Status: AC | PRN
  Administered 2019-08-17: 4 mg via INTRAVENOUS

## 2019-08-17 MED ORDER — PHENYLEPHRINE 40 MCG/ML (10ML) SYRINGE FOR IV PUSH (FOR BLOOD PRESSURE SUPPORT)
PREFILLED_SYRINGE | INTRAVENOUS | Status: DC | PRN
  Administered 2019-08-17 (×2): 80 ug via INTRAVENOUS

## 2019-08-17 MED ORDER — ESCITALOPRAM OXALATE 20 MG PO TABS
20.0000 mg | ORAL_TABLET | Freq: Every day | ORAL | Status: DC
  Administered 2019-08-18: 20 mg via ORAL
  Filled 2019-08-17: qty 1

## 2019-08-17 MED ORDER — HEPARIN SODIUM (PORCINE) 5000 UNIT/ML IJ SOLN
5000.0000 [IU] | INTRAMUSCULAR | Status: AC
  Administered 2019-08-17: 5000 [IU] via SUBCUTANEOUS
  Filled 2019-08-17: qty 1

## 2019-08-17 MED ORDER — BUPIVACAINE LIPOSOME 1.3 % IJ SUSP
INTRAMUSCULAR | Status: DC | PRN
  Administered 2019-08-17: 20 mL

## 2019-08-17 MED ORDER — ACETAMINOPHEN 160 MG/5ML PO SOLN
1000.0000 mg | Freq: Three times a day (TID) | ORAL | Status: DC
  Administered 2019-08-17 – 2019-08-18 (×2): 1000 mg via ORAL
  Filled 2019-08-17 (×2): qty 40.6

## 2019-08-17 MED ORDER — SUGAMMADEX SODIUM 500 MG/5ML IV SOLN
INTRAVENOUS | Status: DC | PRN
  Administered 2019-08-17: 350 mg via INTRAVENOUS

## 2019-08-17 MED ORDER — MIDAZOLAM HCL 2 MG/2ML IJ SOLN
INTRAMUSCULAR | Status: AC
  Filled 2019-08-17: qty 2

## 2019-08-17 MED ORDER — HYDROMORPHONE HCL 1 MG/ML IJ SOLN
INTRAMUSCULAR | Status: AC
  Filled 2019-08-17: qty 1

## 2019-08-17 MED ORDER — CIPROFLOXACIN IN D5W 400 MG/200ML IV SOLN
400.0000 mg | Freq: Once | INTRAVENOUS | Status: AC
  Administered 2019-08-17: 400 mg via INTRAVENOUS
  Filled 2019-08-17: qty 200

## 2019-08-17 MED ORDER — APREPITANT 40 MG PO CAPS
40.0000 mg | ORAL_CAPSULE | ORAL | Status: AC
  Administered 2019-08-17: 40 mg via ORAL
  Filled 2019-08-17: qty 1

## 2019-08-17 MED ORDER — ALBUTEROL SULFATE (2.5 MG/3ML) 0.083% IN NEBU: 2.5000 mg | INHALATION_SOLUTION | RESPIRATORY_TRACT | Status: DC | PRN

## 2019-08-17 MED ORDER — ONDANSETRON HCL 4 MG/2ML IJ SOLN
INTRAMUSCULAR | Status: AC
  Filled 2019-08-17: qty 2

## 2019-08-17 MED ORDER — LIDOCAINE 2% (20 MG/ML) 5 ML SYRINGE
INTRAMUSCULAR | Status: DC | PRN
  Administered 2019-08-17: 1.5 mg/kg/h via INTRAVENOUS

## 2019-08-17 MED ORDER — SODIUM CHLORIDE (PF) 0.9 % IJ SOLN
INTRAMUSCULAR | Status: DC | PRN
  Administered 2019-08-17: 10 mL

## 2019-08-17 MED ORDER — HEPARIN SODIUM (PORCINE) 5000 UNIT/ML IJ SOLN
5000.0000 [IU] | Freq: Three times a day (TID) | INTRAMUSCULAR | Status: DC
  Administered 2019-08-17 – 2019-08-18 (×3): 5000 [IU] via SUBCUTANEOUS
  Filled 2019-08-17 (×3): qty 1

## 2019-08-17 MED ORDER — CHLORHEXIDINE GLUCONATE CLOTH 2 % EX PADS: 6.0000 | MEDICATED_PAD | Freq: Once | CUTANEOUS | Status: DC

## 2019-08-17 MED ORDER — SODIUM CHLORIDE 0.9 % IV SOLN: 2.0000 g | INTRAVENOUS | Status: DC

## 2019-08-17 MED ORDER — FENTANYL CITRATE (PF) 250 MCG/5ML IJ SOLN
INTRAMUSCULAR | Status: AC
  Filled 2019-08-17: qty 5

## 2019-08-17 MED ORDER — FENTANYL CITRATE (PF) 100 MCG/2ML IJ SOLN
INTRAMUSCULAR | Status: DC | PRN
  Administered 2019-08-17 (×3): 50 ug via INTRAVENOUS

## 2019-08-17 MED ORDER — DEXAMETHASONE SODIUM PHOSPHATE 10 MG/ML IJ SOLN
INTRAMUSCULAR | Status: DC | PRN
  Administered 2019-08-17: 6 mg via INTRAVENOUS

## 2019-08-17 MED ORDER — ROCURONIUM BROMIDE 50 MG/5ML IV SOSY
PREFILLED_SYRINGE | INTRAVENOUS | Status: DC | PRN
  Administered 2019-08-17: 20 mg via INTRAVENOUS
  Administered 2019-08-17: 50 mg via INTRAVENOUS

## 2019-08-17 MED ORDER — ACETAMINOPHEN 500 MG PO TABS
1000.0000 mg | ORAL_TABLET | ORAL | Status: AC
  Administered 2019-08-17: 1000 mg via ORAL
  Filled 2019-08-17: qty 2

## 2019-08-17 MED ORDER — LACTATED RINGERS IV SOLN
INTRAVENOUS | Status: DC
  Administered 2019-08-17: 12:00:00 via INTRAVENOUS

## 2019-08-17 MED ORDER — HYDROMORPHONE HCL 1 MG/ML IJ SOLN
0.5000 mg | INTRAMUSCULAR | Status: AC | PRN
  Administered 2019-08-17 (×2): 0.5 mg via INTRAVENOUS

## 2019-08-17 MED ORDER — FENTANYL CITRATE (PF) 100 MCG/2ML IJ SOLN
INTRAMUSCULAR | Status: AC
  Filled 2019-08-17: qty 2

## 2019-08-17 MED ORDER — FENTANYL CITRATE (PF) 100 MCG/2ML IJ SOLN
25.0000 ug | INTRAMUSCULAR | Status: DC | PRN
  Administered 2019-08-17 (×3): 50 ug via INTRAVENOUS

## 2019-08-17 MED ORDER — DEXAMETHASONE SODIUM PHOSPHATE 10 MG/ML IJ SOLN
INTRAMUSCULAR | Status: AC
  Filled 2019-08-17: qty 1

## 2019-08-17 MED ORDER — ACETAMINOPHEN 500 MG PO TABS
1000.0000 mg | ORAL_TABLET | Freq: Three times a day (TID) | ORAL | Status: DC
  Administered 2019-08-18: 14:00:00 1000 mg via ORAL
  Filled 2019-08-17: qty 2

## 2019-08-17 MED ORDER — KETOROLAC TROMETHAMINE 15 MG/ML IJ SOLN
15.0000 mg | Freq: Once | INTRAMUSCULAR | Status: AC | PRN
  Administered 2019-08-17: 15 mg via INTRAVENOUS

## 2019-08-17 MED ORDER — PHENYLEPHRINE 40 MCG/ML (10ML) SYRINGE FOR IV PUSH (FOR BLOOD PRESSURE SUPPORT)
PREFILLED_SYRINGE | INTRAVENOUS | Status: AC
  Filled 2019-08-17: qty 10

## 2019-08-17 MED ORDER — KETOROLAC TROMETHAMINE 30 MG/ML IJ SOLN
INTRAMUSCULAR | Status: AC
  Filled 2019-08-17: qty 1

## 2019-08-17 MED ORDER — PROPOFOL 10 MG/ML IV BOLUS
INTRAVENOUS | Status: DC | PRN
  Administered 2019-08-17: 200 mg via INTRAVENOUS

## 2019-08-17 MED ORDER — FLUTICASONE PROPIONATE 50 MCG/ACT NA SUSP
2.0000 | Freq: Every day | NASAL | Status: DC | PRN
  Filled 2019-08-17: qty 16

## 2019-08-17 MED ORDER — SCOPOLAMINE 1 MG/3DAYS TD PT72
1.0000 | MEDICATED_PATCH | TRANSDERMAL | Status: DC
  Administered 2019-08-17: 1.5 mg via TRANSDERMAL
  Filled 2019-08-17: qty 1

## 2019-08-17 MED ORDER — 0.9 % SODIUM CHLORIDE (POUR BTL) OPTIME
TOPICAL | Status: DC | PRN
  Administered 2019-08-17: 1000 mL

## 2019-08-17 MED ORDER — ONDANSETRON HCL 4 MG/2ML IJ SOLN
INTRAMUSCULAR | Status: DC | PRN
  Administered 2019-08-17: 4 mg via INTRAVENOUS

## 2019-08-17 MED ORDER — PROPOFOL 10 MG/ML IV BOLUS
INTRAVENOUS | Status: AC
  Filled 2019-08-17: qty 20

## 2019-08-17 MED ORDER — ONDANSETRON HCL 4 MG/2ML IJ SOLN
4.0000 mg | INTRAMUSCULAR | Status: DC | PRN
  Administered 2019-08-18: 4 mg via INTRAVENOUS
  Filled 2019-08-17: qty 2

## 2019-08-17 MED ORDER — LACTATED RINGERS IR SOLN
Status: DC | PRN
  Administered 2019-08-17: 1000 mL

## 2019-08-17 MED ORDER — OXYCODONE HCL 5 MG/5ML PO SOLN
5.0000 mg | Freq: Four times a day (QID) | ORAL | Status: DC | PRN
  Administered 2019-08-18 (×2): 5 mg via ORAL
  Filled 2019-08-17 (×2): qty 5

## 2019-08-17 MED ORDER — SUGAMMADEX SODIUM 500 MG/5ML IV SOLN
INTRAVENOUS | Status: AC
  Filled 2019-08-17: qty 10

## 2019-08-17 MED ORDER — FENTANYL CITRATE (PF) 100 MCG/2ML IJ SOLN: 12.5000 ug | INTRAMUSCULAR | Status: DC | PRN

## 2019-08-17 MED ORDER — MIDAZOLAM HCL 5 MG/5ML IJ SOLN
INTRAMUSCULAR | Status: DC | PRN
  Administered 2019-08-17: 2 mg via INTRAVENOUS

## 2019-08-17 MED ORDER — PANTOPRAZOLE SODIUM 40 MG IV SOLR
40.0000 mg | Freq: Every day | INTRAVENOUS | Status: DC
  Administered 2019-08-17: 40 mg via INTRAVENOUS
  Filled 2019-08-17: qty 40

## 2019-08-17 MED ORDER — LIDOCAINE 2% (20 MG/ML) 5 ML SYRINGE
INTRAMUSCULAR | Status: DC | PRN
  Administered 2019-08-17: 80 mg via INTRAVENOUS

## 2019-08-17 MED ORDER — SODIUM CHLORIDE (PF) 0.9 % IJ SOLN
INTRAMUSCULAR | Status: AC
  Filled 2019-08-17: qty 10

## 2019-08-17 MED ORDER — KCL IN DEXTROSE-NACL 20-5-0.45 MEQ/L-%-% IV SOLN
INTRAVENOUS | Status: DC
  Administered 2019-08-17: 18:00:00 via INTRAVENOUS
  Filled 2019-08-17 (×2): qty 1000

## 2019-08-17 MED ORDER — LIDOCAINE 2% (20 MG/ML) 5 ML SYRINGE
INTRAMUSCULAR | Status: AC
  Filled 2019-08-17: qty 5

## 2019-08-17 MED ORDER — SUGAMMADEX SODIUM 500 MG/5ML IV SOLN
INTRAVENOUS | Status: AC
  Filled 2019-08-17: qty 5

## 2019-08-17 MED ORDER — ENSURE MAX PROTEIN PO LIQD
2.0000 [oz_av] | ORAL | Status: DC
  Administered 2019-08-18 (×4): 2 [oz_av] via ORAL

## 2019-08-17 MED ORDER — KETOROLAC TROMETHAMINE 15 MG/ML IJ SOLN
INTRAMUSCULAR | Status: AC
  Filled 2019-08-17: qty 1

## 2019-08-17 MED ORDER — ENALAPRIL MALEATE 10 MG PO TABS
20.0000 mg | ORAL_TABLET | Freq: Two times a day (BID) | ORAL | Status: DC
  Administered 2019-08-17 – 2019-08-18 (×2): 20 mg via ORAL
  Filled 2019-08-17 (×3): qty 2

## 2019-08-17 MED ORDER — HYDROCHLOROTHIAZIDE 25 MG PO TABS: 25.0000 mg | ORAL_TABLET | Freq: Every day | ORAL | Status: DC | PRN

## 2019-08-17 MED ORDER — PNEUMOCOCCAL VAC POLYVALENT 25 MCG/0.5ML IJ INJ
0.5000 mL | INJECTION | INTRAMUSCULAR | Status: AC
  Administered 2019-08-18: 0.5 mL via INTRAMUSCULAR
  Filled 2019-08-17: qty 0.5

## 2019-08-17 MED ORDER — METOPROLOL TARTRATE 5 MG/5ML IV SOLN: 5.0000 mg | Freq: Four times a day (QID) | INTRAVENOUS | Status: DC | PRN

## 2019-08-17 SURGICAL SUPPLY — 64 items
ADH SKN CLS APL DERMABOND .7 (GAUZE/BANDAGES/DRESSINGS) ×1
APL SWBSTK 6 STRL LF DISP (MISCELLANEOUS)
APPLICATOR COTTON TIP 6 STRL (MISCELLANEOUS) IMPLANT
APPLICATOR COTTON TIP 6IN STRL (MISCELLANEOUS)
APPLIER CLIP 5 13 M/L LIGAMAX5 (MISCELLANEOUS)
APPLIER CLIP ROT 10 11.4 M/L (STAPLE)
APPLIER CLIP ROT 13.4 12 LRG (CLIP)
APR CLP LRG 13.4X12 ROT 20 MLT (CLIP)
APR CLP MED LRG 11.4X10 (STAPLE)
APR CLP MED LRG 5 ANG JAW (MISCELLANEOUS)
BLADE SURG 15 STRL LF DISP TIS (BLADE) ×1 IMPLANT
BLADE SURG 15 STRL SS (BLADE) ×2
CABLE HIGH FREQUENCY MONO STRZ (ELECTRODE) ×2 IMPLANT
CLIP APPLIE 5 13 M/L LIGAMAX5 (MISCELLANEOUS) IMPLANT
CLIP APPLIE ROT 10 11.4 M/L (STAPLE) IMPLANT
CLIP APPLIE ROT 13.4 12 LRG (CLIP) IMPLANT
COVER WAND RF STERILE (DRAPES) IMPLANT
DERMABOND ADVANCED (GAUZE/BANDAGES/DRESSINGS) ×1
DERMABOND ADVANCED .7 DNX12 (GAUZE/BANDAGES/DRESSINGS) IMPLANT
DEVICE SUT QUICK LOAD TK 5 (STAPLE) ×2 IMPLANT
DEVICE SUT TI-KNOT TK 5X26 (MISCELLANEOUS) ×1 IMPLANT
DEVICE SUTURE ENDOST 10MM (ENDOMECHANICALS) ×1 IMPLANT
DISSECTOR BLUNT TIP ENDO 5MM (MISCELLANEOUS) ×1 IMPLANT
ELECT REM PT RETURN 15FT ADLT (MISCELLANEOUS) ×2 IMPLANT
GAUZE SPONGE 4X4 12PLY STRL (GAUZE/BANDAGES/DRESSINGS) IMPLANT
GLOVE BIOGEL M 8.0 STRL (GLOVE) ×2 IMPLANT
GOWN STRL REUS W/TWL XL LVL3 (GOWN DISPOSABLE) ×8 IMPLANT
GRASPER SUT TROCAR 14GX15 (MISCELLANEOUS) ×2 IMPLANT
HANDLE STAPLE EGIA 4 XL (STAPLE) ×2 IMPLANT
HOVERMATT SINGLE USE (MISCELLANEOUS) ×2 IMPLANT
KIT BASIN OR (CUSTOM PROCEDURE TRAY) ×2 IMPLANT
KIT TURNOVER KIT A (KITS) IMPLANT
MARKER SKIN DUAL TIP RULER LAB (MISCELLANEOUS) ×2 IMPLANT
NDL SPNL 22GX3.5 QUINCKE BK (NEEDLE) ×1 IMPLANT
NEEDLE SPNL 22GX3.5 QUINCKE BK (NEEDLE) ×2 IMPLANT
PACK UNIVERSAL I (CUSTOM PROCEDURE TRAY) ×2 IMPLANT
RELOAD STAPLE 45 PURP MED/THCK (STAPLE) IMPLANT
RELOAD TRI 45 ART MED THCK BLK (STAPLE) ×2 IMPLANT
RELOAD TRI 45 ART MED THCK PUR (STAPLE) ×2 IMPLANT
RELOAD TRI 60 ART MED THCK BLK (STAPLE) ×2 IMPLANT
RELOAD TRI 60 ART MED THCK PUR (STAPLE) ×2 IMPLANT
SCISSORS LAP 5X45 EPIX DISP (ENDOMECHANICALS) IMPLANT
SET IRRIG TUBING LAPAROSCOPIC (IRRIGATION / IRRIGATOR) ×2 IMPLANT
SET TUBE SMOKE EVAC HIGH FLOW (TUBING) ×2 IMPLANT
SHEARS HARMONIC ACE PLUS 45CM (MISCELLANEOUS) ×2 IMPLANT
SLEEVE ADV FIXATION 5X100MM (TROCAR) ×4 IMPLANT
SLEEVE GASTRECTOMY 36FR VISIGI (MISCELLANEOUS) ×2 IMPLANT
SOL ANTI FOG 6CC (MISCELLANEOUS) ×1 IMPLANT
SOLUTION ANTI FOG 6CC (MISCELLANEOUS) ×1
SPONGE LAP 18X18 RF (DISPOSABLE) ×2 IMPLANT
STAPLER VISISTAT 35W (STAPLE) ×2 IMPLANT
SUT MNCRL AB 4-0 PS2 18 (SUTURE) ×5 IMPLANT
SUT SURGIDAC NAB ES-9 0 48 120 (SUTURE) ×2 IMPLANT
SUT VICRYL 0 TIES 12 18 (SUTURE) ×2 IMPLANT
SYR 10ML ECCENTRIC (SYRINGE) ×2 IMPLANT
SYR 20ML LL LF (SYRINGE) ×2 IMPLANT
TOWEL OR 17X26 10 PK STRL BLUE (TOWEL DISPOSABLE) ×4 IMPLANT
TOWEL OR NON WOVEN STRL DISP B (DISPOSABLE) ×2 IMPLANT
TROCAR ADV FIXATION 5X100MM (TROCAR) ×2 IMPLANT
TROCAR BLADELESS 15MM (ENDOMECHANICALS) ×2 IMPLANT
TROCAR BLADELESS OPT 5 100 (ENDOMECHANICALS) ×2 IMPLANT
TUBE CALIBRATION LAPBAND (TUBING) ×1 IMPLANT
TUBING CONNECTING 10 (TUBING) ×2 IMPLANT
TUBING ENDO SMARTCAP (MISCELLANEOUS) ×2 IMPLANT

## 2019-08-17 NOTE — Anesthesia Procedure Notes (Signed)
Procedure Name: Intubation Date/Time: 08/17/2019 1:06 PM Performed by: West Pugh, CRNA Pre-anesthesia Checklist: Patient identified, Emergency Drugs available, Suction available, Patient being monitored and Timeout performed Patient Re-evaluated:Patient Re-evaluated prior to induction Oxygen Delivery Method: Circle system utilized Preoxygenation: Pre-oxygenation with 100% oxygen Induction Type: IV induction Ventilation: Mask ventilation without difficulty and Oral airway inserted - appropriate to patient size Laryngoscope Size: Mac and 4 Grade View: Grade I Tube type: Oral Tube size: 7.0 mm Number of attempts: 1 Airway Equipment and Method: Stylet Placement Confirmation: ETT inserted through vocal cords under direct vision,  positive ETCO2,  CO2 detector and breath sounds checked- equal and bilateral Secured at: 22 cm Tube secured with: Tape Dental Injury: Teeth and Oropharynx as per pre-operative assessment

## 2019-08-17 NOTE — Progress Notes (Signed)
PHARMACY CONSULT FOR:  Risk Assessment for Post-Discharge VTE Following Bariatric Surgery  Post-Discharge VTE Risk Assessment: This patient's probability of 30-day post-discharge VTE is increased due to the factors marked:   Female  X  Age >/=60 years    BMI >/=50 kg/m2    CHF    Dyspnea at Rest    Paraplegia  X  Non-gastric-band surgery    Operation Time >/=3 hr    Return to OR     Length of Stay >/= 3 d      Hx of VTE   Hypercoagulable condition   Significant venous stasis   Predicted probability of 30-day post-discharge VTE: 0.31%  Other patient-specific factors to consider: none  Recommendation for Discharge: No pharmacologic prophylaxis post-discharge  Jennifer Coleman is a 68 y.o. female who underwent laparoscopic sleeve gastrectomy on 08/17/2019   Allergies  Allergen Reactions  . Penicillins Shortness Of Breath, Swelling, Rash and Other (See Comments)    Did it involve swelling of the face/tongue/throat, SOB, or low BP? Yes Did it involve sudden or severe rash/hives, skin peeling, or any reaction on the inside of your mouth or nose? Yes Did you need to seek medical attention at a hospital or doctor's office? Yes-DR. office When did it last happen?Over 10 years If all above answers are "NO", may proceed with cephalosporin use. Patient tolerates Cephalosporins   . Shellfish Allergy Shortness Of Breath and Swelling  . Acyclovir And Related Other (See Comments)    UNSPECIFIED REACTION   . Codeine Itching  . Morphine And Related Nausea And Vomiting    Patient Measurements: Height: 5\' 5"  (165.1 cm) Weight: 235 lb 2 oz (106.7 kg) IBW/kg (Calculated) : 57 Body mass index is 39.13 kg/m.  No results for input(s): WBC, HGB, HCT, PLT, APTT, CREATININE, LABCREA, CREATININE, CREAT24HRUR, MG, PHOS, ALBUMIN, PROT, ALBUMIN, AST, ALT, ALKPHOS, BILITOT, BILIDIR, IBILI in the last 72 hours. Estimated Creatinine Clearance: 65.2 mL/min (A) (by C-G formula based on SCr of 1.03 mg/dL  (H)).    Past Medical History:  Diagnosis Date  . Anxiety   . Arthritis   . Dysrhythmia    PVCs  . Family history of anesthesia complication 25 yrs ago   Sister allergic to sccinocholine - unable to come off ventilator  . Hypertension   . Pneumonia   . PONV (postoperative nausea and vomiting)   . Premature ventricular contractions   . Shingles   . Varicose veins   . Varicose veins of left lower extremity      Medications Prior to Admission  Medication Sig Dispense Refill Last Dose  . b complex vitamins tablet Take 1 tablet by mouth daily.   08/15/2019  . Calcium Carbonate-Vit D-Min (GNP CALCIUM PLUS 600 +D PO) Take 1 tablet by mouth 2 (two) times daily.    08/16/2019 at Unknown time  . cetirizine (ZYRTEC) 10 MG tablet Take 10 mg by mouth daily.    08/17/2019 at 0730  . diclofenac sodium (VOLTAREN) 1 % GEL APPLY TWO GRAMS TO SHOULDER UP TO FOUR TIMES DAILY (Patient taking differently: Apply 2 g topically daily as needed (pain). Knee) 100 g 2 08/15/2019  . enalapril (VASOTEC) 20 MG tablet Take 1 tablet (20 mg total) by mouth 2 (two) times daily. 180 tablet 1 08/16/2019 at Unknown time  . escitalopram (LEXAPRO) 20 MG tablet Take 1 tablet (20 mg total) by mouth daily. 90 tablet 1 08/17/2019 at 0730  . fluticasone (FLONASE) 50 MCG/ACT nasal spray PLACE 2 SPRAYS  INTO BOTH NOSTRILS DAILY. (Patient taking differently: Place 2 sprays into both nostrils daily as needed for allergies. ) 16 g 0 08/15/2019 at Unknown time  . hydrochlorothiazide (HYDRODIURIL) 25 MG tablet Take 25 mg by mouth as needed (Fluid).    08/14/2019  . ibuprofen (ADVIL) 800 MG tablet Take 1 tablet (800 mg total) by mouth every 8 (eight) hours as needed. 36 tablet 2 08/10/2019  . Multiple Vitamins-Minerals (ONE-A-DAY WOMENS 50 PLUS PO) Take 1 tablet by mouth daily.   08/16/2019 at Unknown time  . docusate sodium (COLACE) 100 MG capsule Take 1 capsule (100 mg total) by mouth 2 (two) times daily. (Patient taking differently:  Take 100 mg by mouth daily as needed for mild constipation. ) 10 capsule 0 More than a month at Unknown time  . PROAIR HFA 108 (90 Base) MCG/ACT inhaler INHALE TWO PUFFS BY MOUTH EVERY 4 TO 6 HOURS AS NEEDED FOR FOR WHEEZING (Patient taking differently: Inhale 1-2 puffs into the lungs every 4 (four) hours as needed for wheezing or shortness of breath. ) 1 Inhaler 0 More than a month at Unknown time       Jennifer Coleman, Jennifer Coleman [phone] 08/17/2019 5:14 PM

## 2019-08-17 NOTE — Transfer of Care (Signed)
Immediate Anesthesia Transfer of Care Note  Patient: Jennifer Coleman  Procedure(s) Performed: LAPAROSCOPIC GASTRIC SLEEVE RESECTION AND HIATAL HERNIA REPAIR, UPPPER ENDO AND ERAS PATHWAY (N/A Abdomen)  Patient Location: PACU  Anesthesia Type:General  Level of Consciousness: awake, drowsy and patient cooperative  Airway & Oxygen Therapy: Patient Spontanous Breathing and Patient connected to face mask oxygen  Post-op Assessment: Report given to RN and Post -op Vital signs reviewed and stable  Post vital signs: Reviewed and stable  Last Vitals:  Vitals Value Taken Time  BP 154/77 08/17/19 1445  Temp    Pulse 72 08/17/19 1445  Resp 18 08/17/19 1446  SpO2 100 % 08/17/19 1445  Vitals shown include unvalidated device data.  Last Pain:  Vitals:   08/17/19 1139  TempSrc:   PainSc: 0-No pain         Complications: No apparent anesthesia complications

## 2019-08-17 NOTE — Discharge Instructions (Signed)
° ° ° °GASTRIC BYPASS/SLEEVE ° Home Care Instructions ° ° These instructions are to help you care for yourself when you go home. ° °Call: If you have any problems. °• Call [phone] and ask for the surgeon on call °• If you need immediate help, come to the ER at [facility].  °• Tell the ER staff that you are a new post-op gastric bypass or gastric sleeve patient °  °Signs and symptoms to report: • Severe vomiting or nausea °o If you cannot keep down clear liquids for longer than 1 day, call your surgeon  °• Abdominal pain that does not get better after taking your pain medication °• Fever over 100.4° F with chills °• Heart beating over 100 beats a minute °• Shortness of breath at rest °• Chest pain °•  Redness, swelling, drainage, or foul odor at incision (surgical) sites °•  If your incisions open or pull apart °• Swelling or pain in calf (lower leg) °• Diarrhea (Loose bowel movements that happen often), frequent watery, uncontrolled bowel movements °• Constipation, (no bowel movements for 3 days) if this happens: Pick one °o Milk of Magnesia, 2 tablespoons by mouth, 3 times a day for 2 days if needed °o Stop taking Milk of Magnesia once you have a bowel movement °o Call your doctor if constipation continues °Or °o Miralax  (instead of Milk of Magnesia) following the label instructions °o Stop taking Miralax once you have a bowel movement °o Call your doctor if constipation continues °• Anything you think is not normal °  °Normal side effects after surgery: • Unable to sleep at night or unable to focus °• Irritability or moody °• Being tearful (crying) or depressed °These are common complaints, possibly related to your anesthesia medications that put you to sleep, stress of surgery, and change in lifestyle.  This usually goes away a few weeks after surgery.  If these feelings continue, call your primary care doctor. °  °Wound Care: You may have surgical glue, steri-strips, or staples over your incisions after  surgery °• Surgical glue:  Looks like a clear film over your incisions and will wear off a little at a time °• Steri-strips: Strips of tape over your incisions. You may notice a yellowish color on the skin under the steri-strips. This is used to make the   steri-strips stick better. Do not pull the steri-strips off - let them fall off °• Staples: Staples may be removed before you leave the hospital °o If you go home with staples, call [facility], [phone] at for an appointment with your surgeon’s nurse to have staples removed 10 days after surgery. °• Showering: You may shower two (2) days after your surgery unless your surgeon tells you differently °o Wash gently around incisions with warm soapy water, rinse well, and gently pat dry  °o No tub baths until staples are removed, steri-strips fall off or glue is gone.  °  °Medications: • Medications should be liquid or crushed if larger than the size of a dime °• Extended release pills (medication that release a little bit at a time through the day) should NOT be crushed or cut. (examples include XL, ER, DR, SR) °• Depending on the size and number of medications you take, you may need to space (take a few throughout the day)/change the time you take your medications so that you do not over-fill your pouch (smaller stomach) °• Make sure you follow-up with your primary care doctor to   make medication changes needed during rapid weight loss and life-style changes °• If you have diabetes, follow up with the doctor that orders your diabetes medication(s) within one week after surgery and check your blood sugar regularly. °• Do not drive while taking prescription pain medication  °• It is ok to take Tylenol by the bottle instructions with your pain medicine or instead of your pain medicine as needed.  DO NOT TAKE NSAIDS (EXAMPLES OF NSAIDS:  IBUPROFREN/ NAPROXEN)  °Diet:                    First 2 Weeks ° You will see the dietician t about two (2) weeks  after your surgery. The dietician will increase the types of foods you can eat if you are handling liquids well: °• If you have severe vomiting or nausea and cannot keep down clear liquids lasting longer than 1 day, call your surgeon @ ([phone]) °Protein Shake °• Drink at least 2 ounces of shake 5-6 times per day °• Each serving of protein shakes (usually 8 - 12 ounces) should have: °o 15 grams of protein  °o And no more than 5 grams of carbohydrate  °• Goal for protein each day: °o Men = 80 grams per day °o Women = 60 grams per day °• Protein powder may be added to fluids such as non-fat milk or Lactaid milk or unsweetened Soy/Almond milk (limit to 35 grams added protein powder per serving) ° °Hydration °• Slowly increase the amount of water and other clear liquids as tolerated (See Acceptable Fluids) °• Slowly increase the amount of protein shake as tolerated  °•  Sip fluids slowly and throughout the day.  Do not use straws. °• May use sugar substitutes in small amounts (no more than 6 - 8 packets per day; i.e. Splenda) ° °Fluid Goal °• The first goal is to drink at least 8 ounces of protein shake/drink per day (or as directed by the nutritionist); some examples of protein shakes are Syntrax Nectar, Adkins Advantage, EAS Edge HP, and Unjury. See handout from pre-op Bariatric Education Class: °o Slowly increase the amount of protein shake you drink as tolerated °o You may find it easier to slowly sip shakes throughout the day °o It is important to get your proteins in first °• Your fluid goal is to drink 64 - 100 ounces of fluid daily °o It may take a few weeks to build up to this °• 32 oz (or more) should be clear liquids  °And  °• 32 oz (or more) should be full liquids (see below for examples) °• Liquids should not contain sugar, caffeine, or carbonation ° °Clear Liquids: °• Water or Sugar-free flavored water (i.e. Fruit H2O, Propel) °• Decaffeinated coffee or tea (sugar-free) °• Crystal Lite, Wyler’s Lite,  Minute Maid Lite °• Sugar-free Jell-O °• Bouillon or broth °• Sugar-free Popsicle:   *Less than 20 calories each; Limit 1 per day ° °Full Liquids: °Protein Shakes/Drinks + 2 choices per day of other full liquids °• Full liquids must be: °o No More Than 15 grams of Carbs per serving  °o No More Than 3 grams of Fat per serving °• Strained low-fat cream soup (except Cream of Potato or Tomato) °• Non-Fat milk °• Fat-free Lactaid Milk °• Unsweetened Soy Or Unsweetened Almond Milk °• Low Sugar yogurt (Dannon Lite & Fit, Greek yogurt; Oikos Triple Zero; Chobani Simply 100; Yoplait 100 calorie Greek - No Fruit on the Bottom) ° °  °Vitamins   and Minerals • Start 1 day after surgery unless otherwise directed by your surgeon °• 2 Chewable Bariatric Specific Multivitamin / Multimineral Supplement with iron (Example: Bariatric Advantage Multi EA) °• Chewable Calcium with Vitamin D-3 °(Example: 3 Chewable Calcium Plus 600 with Vitamin D-3) °o Take 500 mg three (3) times a day for a total of 1500 mg each day °o Do not take all 3 doses of calcium at one time as it may cause constipation, and you can only absorb 500 mg  at a time  °o Do not mix multivitamins containing iron with calcium supplements; take 2 hours apart °• Menstruating women and those with a history of anemia (a blood disease that causes weakness) may need extra iron °o Talk with your doctor to see if you need more iron °• Do not stop taking or change any vitamins or minerals until you talk to your dietitian or surgeon °• Your Dietitian and/or surgeon must approve all vitamin and mineral supplements °  °Activity and Exercise: Limit your physical activity as instructed by your doctor.  It is important to continue walking at home.  During this time, use these guidelines: °• Do not lift anything greater than ten (10) pounds for at least two (2) weeks °• Do not go back to work or drive until your surgeon says you can °• You may have sex when you feel comfortable  °o It is  VERY important for female patients to use a reliable birth control method; fertility often increases after surgery  °o All hormonal birth control will be ineffective for 30 days after surgery due to medications given during surgery a barrier method must be used. °o Do not get pregnant for at least 18 months °• Start exercising as soon as your doctor tells you that you can °o Make sure your doctor approves any physical activity °• Start with a simple walking program °• Walk 5-15 minutes each day, 7 days per week.  °• Slowly increase until you are walking 30-45 minutes per day °Consider joining our BELT program. [phone] or email belt@[employer] °  °Special Instructions Things to remember: °• Use your CPAP when sleeping if this applies to you ° °• [facility] has two free Bariatric Surgery Support Groups that meet monthly °o The 3rd Thursday of each month, 6 pm, [facility]  °o The 2nd Friday of each month, 11:45 am in the private dining room in the basement of [facility] °• It is very important to keep all follow up appointments with your surgeon, dietitian, primary care physician, and behavioral health practitioner °• Routine follow up schedule with your surgeon include appointments at 2-3 weeks, 6-8 weeks, 6 months, and 1 year at a minimum.  Your surgeon may request to see you more often.   °o After the first year, please follow up with your bariatric surgeon and dietitian at least once a year in order to maintain best weight loss results °[facility]: [phone] °[facility]: [phone] °Bariatric Nurse Coordinator: [phone] °  °   Reviewed and Endorsed  °by [facility] Patient Education Committee, June, 2016 °Edits Approved: Aug, 2018 ° ° ° °

## 2019-08-17 NOTE — Op Note (Signed)
Name:  Jennifer Coleman MRN: [id number] Date of Surgery: 08/17/2019  Preop Diagnosis:  Morbid Obesity  Postop Diagnosis:  Morbid Obesity (Weight - 106 kg, BMI - 39.1), S/P Gastric Sleeve resection  Procedure:  Upper endoscopy  (Intraoperative)  Surgeon:  Alphonsa Overall, M.D.  Anesthesia:  GET  Indications for procedure: Jennifer Coleman is a 68 y.o. female whose primary care physician is Mikey Kirschner, MD and has completed a gastric sleeve resection today for weight loss by Dr. Hassell Done.  I am doing an intraoperative upper endoscopy to evaluate the gastric pouch after the sleeve gastrectomy.  Operative Note: The patient is under general anesthesia.  Dr. Hassell Done is laparoscoping the patient while I do an upper endoscopy to evaluate the stomach pouch.  With the patient intubated, I passed the Olympus upper endoscope without difficulty down the esophagus.  The esophagus was unremarkable.  The esophago-gastric junction was at 38 cm.    The mucosa of the stomach looked viable and the staple line was intact without bleeding.  I advanced the scope to the pylorus, but did not go through it.  While I insufflated the stomach pouch with air, Dr. Hassell Done  flooded the upper abdomen with saline to put the gastric pouch under saline.  There was no bubbling or evidence of a leak.  There was no evidence of narrowing of the pouch and the gastric sleeve looked tubular.  The scope was then withdrawn.  The esophagus was unremarkable and the patient tolerated the endoscopy without difficulty.  Alphonsa Overall, MD, [facility] Surgery Office phone:  [phone]

## 2019-08-17 NOTE — Anesthesia Preprocedure Evaluation (Signed)
Anesthesia Evaluation  Patient identified by MRN, date of birth, ID band Patient awake    Reviewed: Allergy & Precautions, NPO status , Patient's Chart, lab work & pertinent test results  History of Anesthesia Complications (+) PONV and history of anesthetic complications  Airway Mallampati: I  TM Distance: >3 FB Neck ROM: Full    Dental no notable dental hx.    Pulmonary former smoker,    Pulmonary exam normal breath sounds clear to auscultation       Cardiovascular hypertension, Pt. on medications Normal cardiovascular exam Rhythm:Regular Rate:Normal  ECG: rate 64. Normal sinus rhythm Right bundle branch block Left atrial enlargement   Neuro/Psych PSYCHIATRIC DISORDERS Anxiety Depression negative neurological ROS     GI/Hepatic negative GI ROS, Neg liver ROS,   Endo/Other    Renal/GU negative Renal ROS     Musculoskeletal negative musculoskeletal ROS (+)   Abdominal (+) + obese,   Peds  Hematology negative hematology ROS (+)   Anesthesia Other Findings MORBID OBESITY  Reproductive/Obstetrics                             Anesthesia Physical Anesthesia Plan  ASA: III  Anesthesia Plan: General   Post-op Pain Management:    Induction: Intravenous  PONV Risk Score and Plan: 4 or greater and Midazolam, Dexamethasone, Ondansetron and Treatment may vary due to age or medical condition  Airway Management Planned: Oral ETT  Additional Equipment:   Intra-op Plan:   Post-operative Plan: Extubation in OR  Informed Consent: I have reviewed the patients History and Physical, chart, labs and discussed the procedure including the risks, benefits and alternatives for the proposed anesthesia with the patient or authorized representative who has indicated his/her understanding and acceptance.     Dental advisory given  Plan Discussed with: CRNA  Anesthesia Plan Comments:          Anesthesia Quick Evaluation

## 2019-08-17 NOTE — Interval H&P Note (Signed)
History and Physical Interval Note:  08/17/2019 12:28 PM  Jennifer Coleman  has presented today for surgery, with the diagnosis of MORBID OBESITY.  The various methods of treatment have been discussed with the patient and family. After consideration of risks, benefits and other options for treatment, the patient has consented to  Procedure(s): LAPAROSCOPIC GASTRIC SLEEVE RESECTION AND HIATAL HERNIA REPAIR, [name] (N/A) as a surgical intervention.  The patient's history has been reviewed, patient examined, no change in status, stable for surgery.  I have reviewed the patient's chart and labs.  Questions were answered to the patient's satisfaction.     [name]

## 2019-08-17 NOTE — Anesthesia Postprocedure Evaluation (Signed)
Anesthesia Post Note  Patient: Jennifer Coleman  Procedure(s) Performed: LAPAROSCOPIC GASTRIC SLEEVE RESECTION AND HIATAL HERNIA REPAIR, UPPPER ENDO AND ERAS PATHWAY (N/A Abdomen)     Patient location during evaluation: PACU Anesthesia Type: General Level of consciousness: awake and alert Pain management: pain level controlled Vital Signs Assessment: post-procedure vital signs reviewed and stable Respiratory status: spontaneous breathing, nonlabored ventilation, respiratory function stable and patient connected to nasal cannula oxygen Cardiovascular status: blood pressure returned to baseline and stable Postop Assessment: no apparent nausea or vomiting Anesthetic complications: no    Last Vitals:  Vitals:   08/17/19 1700 08/17/19 1724  BP: (!) 156/72 (!) 154/72  Pulse: 65 67  Resp: 12 20  Temp:  36.6 C  SpO2: 100% 100%    Last Pain:  Vitals:   08/17/19 1724  TempSrc: Oral  PainSc:                  [name] P Moses Odoherty

## 2019-08-17 NOTE — Op Note (Signed)
@  date@  Surgeon: Kaylyn Lim, MD, FACS  Asst:  Alphonsa Overall, MD, FACS  Anes:  General endotracheal  Procedure: Laparoscopic repair of hiatal hernia (2 sutures posterior) and  sleeve gastrectomy and upper endoscopy  Diagnosis: Morbid obesity  Complications: None noted  EBL:   minimal cc  Description of Procedure:  The patient was take to OR 2 and given general anesthesia.  The abdomen was prepped with Hibiclens and draped sterilely.  A timeout was performed.  Access to the abdomen was achieved with a 5 mm Optiview through the left upper quadrant.  Following insufflation, the state of the abdomen was found to be free of adhesions.  There was a visible hiatal hernia that was prominent.  This correlated with the UGI.  Dissection was done and the crura were repaired with two sutures of 0 Surgidek and TyKnots.  A calibration tube was passed to validate the adequacy of the hiatal hernia.    The ViSiGi 36Fr tube was inserted to deflate the stomach and was pulled back into the esophagus.    The pylorus was identified and we measured 5 cm back and marked the antrum.  At that point we began dissection to take down the greater curvature of the stomach using the Harmonic scalpel.  This dissection was taken all the way up to the left crus.  Posterior attachments of the stomach were also taken down.    The ViSiGi tube was then passed into the antrum and suction applied so that it was snug along the lessor curvature.  The "crow's foot" or incisura was identified.  The sleeve gastrectomy was begun using the [employer] stapler beginning with a 4.5 cm black load followed by a 6 cm black load and then purple loads with TRS to complete the sleeve.  When the sleeve was complete the tube was taken off suction and insufflated briefly.  The tube was withdrawn.  Upper endoscopy was then performed by Dr. Lucia Gaskins.     The specimen was extracted through the 15 trocar site.  This site was closed with a 0 vicryl.    Local was provided by infiltrating with Exaparel as a TAP block at the beginning of the case and closed 4-0 Monocryl and Dermabond.    Matt B. Hassell Done, [place], [facility], [place]

## 2019-08-17 NOTE — Progress Notes (Signed)
Discussed post op day goals with patient including ambulation, IS, diet progression, pain, and nausea control.  BSTOP education provided including BSTOP information guide, "Guide for Pain Management after your Bariatric Procedure".  Questions answered. 

## 2019-08-18 ENCOUNTER — Encounter (HOSPITAL_COMMUNITY): Payer: Self-pay | Admitting: Surgery

## 2019-08-18 LAB — CBC WITH DIFFERENTIAL/PLATELET
Abs Immature Granulocytes: 0.03 10*3/uL (ref 0.00–0.07)
Basophils Absolute: 0 10*3/uL (ref 0.0–0.1)
Basophils Relative: 0 %
Eosinophils Absolute: 0 10*3/uL (ref 0.0–0.5)
Eosinophils Relative: 0 %
HCT: 40.7 % (ref 36.0–46.0)
Hemoglobin: 13.3 g/dL (ref 12.0–15.0)
Immature Granulocytes: 0 %
Lymphocytes Relative: 14 %
Lymphs Abs: 1.4 10*3/uL (ref 0.7–4.0)
MCH: 31.7 pg (ref 26.0–34.0)
MCHC: 32.7 g/dL (ref 30.0–36.0)
MCV: 96.9 fL (ref 80.0–100.0)
Monocytes Absolute: 0.6 10*3/uL (ref 0.1–1.0)
Monocytes Relative: 6 %
Neutro Abs: 8 10*3/uL — ABNORMAL HIGH (ref 1.7–7.7)
Neutrophils Relative %: 80 %
Platelets: 243 10*3/uL (ref 150–400)
RBC: 4.2 MIL/uL (ref 3.87–5.11)
RDW: 11.9 % (ref 11.5–15.5)
WBC: 9.9 10*3/uL (ref 4.0–10.5)
nRBC: 0 % (ref 0.0–0.2)

## 2019-08-18 LAB — SURGICAL PATHOLOGY

## 2019-08-18 MED ORDER — OXYCODONE HCL 5 MG PO TABS: 5.0000 mg | ORAL_TABLET | Freq: Four times a day (QID) | ORAL | 0 refills | Status: DC | PRN

## 2019-08-18 MED ORDER — ONDANSETRON 4 MG PO TBDP: 4.0000 mg | ORAL_TABLET | Freq: Four times a day (QID) | ORAL | 0 refills | Status: DC | PRN

## 2019-08-18 MED FILL — oxyCODONE HCL 5 MG TABS: 5 | 2 days supply | Qty: 10 | Fill #0

## 2019-08-18 MED FILL — ONDANSETRON ODT 4 MG TABLET: 4 | 5 days supply | Qty: 20 | Fill #0

## 2019-08-18 NOTE — Progress Notes (Signed)

## 2019-08-18 NOTE — Progress Notes (Signed)
Patient alert and oriented, Post op day 1.  Provided support and encouragement.  Encouraged pulmonary toilet, ambulation and small sips of liquids.  All questions answered.  Will continue to monitor. 

## 2019-08-18 NOTE — Progress Notes (Signed)
Nutrition Note  RD consulted for diet education for patient s/p bariatric surgery. While RDs are working remotely, Insurance risk surveyor providing education.   If nutrition issues arise, please consult RD.   Clayton Bibles, MS, RD, LDN Inpatient Clinical Dietitian Pager: [phone] After Hours Pager: [phone]

## 2019-08-18 NOTE — Progress Notes (Signed)
Discharge instructions given to patient. Patient had no questions. NT or writer will wheel patient out once family pulls the car to the front

## 2019-08-19 NOTE — Discharge Summary (Signed)
Physician Discharge Summary  Patient ID: Jennifer Coleman MRN: [id number] DOB/AGE: 1951-01-20 68 y.o.  PCP: Mikey Kirschner, MD  Admit date: 08/17/2019 Discharge date: 08/18/2019  Admission Diagnoses:  Morbid obesity  Discharge Diagnoses:  same  Active Problems:   S/P laparoscopic sleeve gastrectomy   Surgery:  Laparoscopic sleeve gastrectomy with 2 suture posterior repair of hiatal hernia  Discharged Condition: improved  Hospital Course:   Had surgery and begun on liquids.  Ready for discharge on PD 1  Consults: none  Significant Diagnostic Studies: none    Discharge Exam: Blood pressure (!) 167/82, pulse 68, temperature 98.4 F (36.9 C), temperature source Oral, resp. rate 18, height 5\' 5"  (1.651 m), weight 106.7 kg, SpO2 95 %. Incisions OK  Disposition:   Discharge Instructions    Ambulate hourly while awake   Complete by: As directed    Call MD for:  difficulty breathing, headache or visual disturbances   Complete by: As directed    Call MD for:  persistant dizziness or light-headedness   Complete by: As directed    Call MD for:  persistant nausea and vomiting   Complete by: As directed    Call MD for:  redness, tenderness, or signs of infection (pain, swelling, redness, odor or green/yellow discharge around incision site)   Complete by: As directed    Call MD for:  severe uncontrolled pain   Complete by: As directed    Call MD for:  temperature >101 F   Complete by: As directed    Diet bariatric full liquid   Complete by: As directed    Incentive spirometry   Complete by: As directed    Perform hourly while awake     Allergies as of 08/18/2019      Reactions   Penicillins Shortness Of Breath, Swelling, Rash, Other (See Comments)   Did it involve swelling of the face/tongue/throat, SOB, or low BP? Yes Did it involve sudden or severe rash/hives, skin peeling, or any reaction on the inside of your mouth or nose? Yes Did you need to seek medical attention  at a hospital or doctor's office? Yes-DR. office When did it last happen?Over 10 years If all above answers are "NO", may proceed with cephalosporin use. Patient tolerates Cephalosporins   Shellfish Allergy Shortness Of Breath, Swelling   Acyclovir And Related Other (See Comments)   UNSPECIFIED REACTION    Codeine Itching   Morphine And Related Nausea And Vomiting      Medication List    STOP taking these medications   ibuprofen 800 MG tablet Commonly known as: ADVIL     TAKE these medications   b complex vitamins tablet Take 1 tablet by mouth daily.   cetirizine 10 MG tablet Commonly known as: ZYRTEC Take 10 mg by mouth daily.   diclofenac sodium 1 % Gel Commonly known as: VOLTAREN APPLY TWO GRAMS TO SHOULDER UP TO FOUR TIMES DAILY What changed:   how much to take  how to take this  when to take this  reasons to take this  additional instructions   docusate sodium 100 MG capsule Commonly known as: COLACE Take 1 capsule (100 mg total) by mouth 2 (two) times daily. What changed:   when to take this  reasons to take this   enalapril 20 MG tablet Commonly known as: VASOTEC Take 1 tablet (20 mg total) by mouth 2 (two) times daily. Notes to patient: Monitor Blood Pressure Daily and keep a log for primary care  physician.  You may need to make changes to your medications with rapid weight loss.     escitalopram 20 MG tablet Commonly known as: LEXAPRO Take 1 tablet (20 mg total) by mouth daily.   fluticasone 50 MCG/ACT nasal spray Commonly known as: FLONASE PLACE 2 SPRAYS INTO BOTH NOSTRILS DAILY. What changed:   when to take this  reasons to take this   GNP CALCIUM PLUS 600 +D PO Take 1 tablet by mouth 2 (two) times daily.   hydrochlorothiazide 25 MG tablet Commonly known as: HYDRODIURIL Take 25 mg by mouth as needed (Fluid). Notes to patient: Monitor Blood Pressure Daily and keep a log for primary care physician.  Monitor for symptoms of  dehydration.  You may need to make changes to your medications with rapid weight loss.     ondansetron 4 MG disintegrating tablet Commonly known as: ZOFRAN-ODT Take 1 tablet (4 mg total) by mouth every 6 (six) hours as needed for nausea or vomiting.   ONE-A-DAY WOMENS 50 PLUS PO Take 1 tablet by mouth daily.   oxyCODONE 5 MG immediate release tablet Commonly known as: Oxy IR/ROXICODONE Take 1 tablet (5 mg total) by mouth every 6 (six) hours as needed for severe pain.   ProAir HFA 108 (90 Base) MCG/ACT inhaler Generic drug: albuterol INHALE TWO PUFFS BY MOUTH EVERY 4 TO 6 HOURS AS NEEDED FOR FOR WHEEZING What changed:   how much to take  how to take this  when to take this  reasons to take this  additional instructions      Follow-up Information    Surgery, [place]. Go on 09/02/2019.   Specialty: General Surgery Why: at 10 Contact information: [address] [place] [address] [phone]        Carlena Hurl, PA-C. Go on 10/06/2019.   Specialty: General Surgery Why: at [facility] information: [address] [place] [address] [phone]           Signed: [name] 08/19/2019, 2:21 PM

## 2019-08-20 ENCOUNTER — Encounter: Payer: Self-pay | Admitting: *Deleted

## 2019-08-20 ENCOUNTER — Other Ambulatory Visit: Payer: Self-pay | Admitting: *Deleted

## 2019-08-20 NOTE — Patient Outreach (Signed)
[facility]) Care Management  08/20/2019  Jennifer Coleman June 24, 1951 [id number]   Transition of care telephone call  Referral received: 08/17/19 Initial outreach: 08/20/19 Insurance: [employer]  Initial unsuccessful telephone call to patient's preferred (mobile/home) number in order to complete transition of care assessment; unable to complete call due to fast busy single after phone number was dialed.  Objective: Per the electronic medical record, Jennifer Coleman was hospitalized at [facility] from 10/19-10/20/20 for laparoscopic repair of hiatal hernia (2 sutures posterior) and  sleeve gastrectomy and upper endoscopy to treat morbid obesity.  Comorbidities include: dysrhythmia, HTN, anxiety/depression, renal insufficiency, right shoulder rotator cuff tear, impingement syndrome of left shoulder, epigastric pain, shingles. She was discharged to home on 08/18/19 without the need for home health services or durable medical equipment per the discharge summary.   Plan: Will attempt to reach patient later today to complete transition of care assessment.  [name] RN,CCM,CDE [place] Management Coordinator Office Phone [phone] Office Fax [phone]

## 2019-08-20 NOTE — Patient Outreach (Addendum)
[facility]) Care Management  08/20/2019  TENEIL KNAPPE 01/27/1951 [id number]   Transition of care call/case closure   Referral received: 08/17/19 Initial outreach: 08/20/19 Insurance: [employer] Health Save Plan   Subjective: Second attempt to reach patient at preferred number successful. 2 HIPAA identifiers verified. Explained purpose of call and completed transition of care assessment.  Sinaya states she is doing well except for upper GI gas pains, denies post-operative problems, says surgical incisions are unremarkable, states surgical pain well managed with prescribed medications, tolerating bariatric full liquid diet and meeting her fluid intake goals. Spouse and her sister are assisting with her recovery.  She states she struggles with controlling her blood pressure and that plus a rising Hgb A1C greatly influenced her decision to proceed with gastric sleeve surgery. She says she would like information on the benefits that [facility] provides regarding chronic disease management emailed to her personal e-mail account. She says she would also be appreciative of having the link that will allow her to sign up for benefits from home e-mailed to her. She says she does not have the hospital indemnity. She says she uses a [facility] outpatient pharmacy.  She denies educational needs related to staying safe during the COVID 19 pandemic.     Objective: Per the electronic medical record, Kalice Janey was hospitalized at [facility] from 10/19-10/20/20 for laparoscopicrepair of hiatal hernia (2 sutures posterior) andsleeve gastrectomy and upper endoscopy to treat morbid obesity.  Comorbidities include: dysrhythmia, HTN, anxiety/depression, renal insufficiency, right shoulder rotator cuff tear, impingement syndrome of left shoulder, epigastric pain, shingles. She was discharged to home on 08/18/19 without the need for home health services or durable medical equipment per the discharge  summary.   Assessment:  Patient voices good understanding of all discharge instructions.  See transition of care flowsheet for assessment details.   Plan:  Inda Merlin to contact her surgeon's office regarding how to treat her upper GI gas pains since this pain is more significant than her surgical pain.  Reviewed hospital discharge diagnosis of laparoscopicrepair of hiatal hernia (2 sutures posterior) andsleeve gastrectomy and upper endoscopy  and discharge treatment plan using hospital discharge instructions, assessing medication adherence, reviewing problems requiring provider notification, and discussing the importance of follow up with surgeon and registered dietician for bariatric nutrition classes as directed. Reviewed [employer] announcements that all [place] members will receive the Healthy Lifestyle Premium rate in 2021. Reminded patient of [facility] [employer] Open Enrollment Dates: Oct 12th - 30th Per patient request, emailed the [place] Management website and benefit sign up link to her personal e-mail address.   No ongoing care management needs identified so will close case to [place] Management services and route successful outreach letter with [place] Management pamphlet and 24 Hour Nurse Line Magnet to [place] Management clinical pool to be mailed to patient's home address.  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  [name] RN,CCM,CDE [place] Management Coordinator Office Phone [phone] Office Fax [phone]

## 2019-08-24 ENCOUNTER — Telehealth (HOSPITAL_COMMUNITY): Payer: Self-pay | Admitting: *Deleted

## 2019-08-24 NOTE — Telephone Encounter (Signed)
Patient called to discuss post bariatric surgery follow up questions.  See below:   1.  Tell me about your pain and pain management?  No OXY; took 2 Tyl one day and 1 on another- mod pain, tolerable, think it's more gas.  Pt described SOB at rest..  Getting 1750 on IS; no chest pain, calf pain. Advised to call Dr. Earlie Server office for possible earlier appt or chest xray.  2.  Let's talk about fluid intake.  How much total fluid are you taking in?- taking in between 48 oz most days; 60 oz a couple days, sipping all throughout day  3.  How much protein have you taken in the last 2 days?- 2 shakes a day  4.  Have you had nausea?  Tell me about when have experienced nausea and what you did to help?- occasional nausea; no vomiting  5.  Has the frequency or color changed with your urine?- "steadily peeing", more pale  6.  Tell me what your incisions look like?- no redness, swelling or drng  7.  Have you been passing gas? BM?- yes on flatus and had BM yesterday and today  8.  If a problem or question were to arise who would you call?  Do you know contact numbers for [facility], [facility], and [facility]? yes  9.  How has the walking going?-walking between 3,000 and 5,000 steps a day  10.  How are your vitamins and calcium going?  How are you taking them?- taking Vit twice daily and calcium 3x day- forgot last Vits one day

## 2019-09-01 ENCOUNTER — Ambulatory Visit: Payer: 59

## 2019-09-02 ENCOUNTER — Other Ambulatory Visit: Payer: Self-pay

## 2019-09-02 ENCOUNTER — Encounter: Payer: Self-pay | Admitting: Dietician

## 2019-09-02 ENCOUNTER — Encounter: Payer: 59 | Attending: Surgery | Admitting: Dietician

## 2019-09-02 DIAGNOSIS — Z6838 Body mass index (BMI) 38.0-38.9, adult: Secondary | ICD-10-CM | POA: Insufficient documentation

## 2019-09-02 DIAGNOSIS — Z713 Dietary counseling and surveillance: Secondary | ICD-10-CM | POA: Insufficient documentation

## 2019-09-02 DIAGNOSIS — E669 Obesity, unspecified: Secondary | ICD-10-CM

## 2019-09-02 NOTE — Patient Instructions (Signed)
Great job! See you in about 6 weeks.

## 2019-09-02 NOTE — Progress Notes (Signed)
2 Week Post-Operative Nutrition Follow Up Bariatric Nutrition Education  Start Time: 8:10am   End Time: 9:00am  Patient was seen on 09/02/2019 for post-operative nutrition [facility]   Surgery date: 08/17/2019 Surgery type: Sleeve   Start weight at [facility]: 249 lbs (date: 11/11/2018) Weight today: 230 lbs Weight change: -14 lbs (since Pre-Op Class on 07/20/2019)   Body Composition Scale 09/02/2019  Weight (lbs) 230  BMI 38.3  Total Body Fat % -     Visceral Fat -  Fat-Free Mass % -     Total Body Water % -     Muscle-Mass (lbs) -  Body Fat Displacement ---         Torso  (lbs) -         Left Leg  (lbs) -         Right Leg  (lbs) -         Left Arm  (lbs) -         Right Arm  (lbs) -   Drinks a lot of water, [place] water, and 2 protein shakes per day. Will have chicken broth, jello, and popsicles. States she sets out her supplements and her beverages every day and works on them throughout the day. At first did not think she was meeting fluid goal, but was only including water and not other foods/beverages (such as protein shakes and popsicles.) Patient and her husband are the guardians for some of their grandchildren, patient states she stays busy with this and is looking forward to becoming healthier through this process so that she can better care for her family.   Bariatric MVI, calcium 3x/day.  Focuses on walking throughout the day, either around the house or taking the dog out for walks.   The following the learning objectives were met by the patient during this course:  Identifies Phase 3 (Soft, High Protein Foods) Dietary Goals and will begin from 2 weeks post-operatively to 2 months post-operatively  Identifies appropriate sources of fluids and proteins   States protein recommendations and appropriate sources post-operatively  Identifies the need for appropriate texture modifications, mastication, and bite sizes when  consuming solids  Identifies appropriate multivitamin and calcium sources post-operatively  Describes the need for physical activity post-operatively and will follow MD recommendations  States when to call healthcare provider regarding medication questions or post-operative complications  Handouts given include:  Phase 3: Soft, High Protein Diet  Phase 3 Meal Ideas  Follow-Up Plan: Patient will follow-up at [facility] in 6 weeks for 2 month post-op nutrition visit for diet advancement per MD.

## 2019-09-03 ENCOUNTER — Other Ambulatory Visit (HOSPITAL_COMMUNITY): Payer: Self-pay | Admitting: Family Medicine

## 2019-09-03 ENCOUNTER — Ambulatory Visit (INDEPENDENT_AMBULATORY_CARE_PROVIDER_SITE_OTHER): Payer: 59 | Admitting: Family Medicine

## 2019-09-03 ENCOUNTER — Ambulatory Visit (HOSPITAL_COMMUNITY)
Admission: RE | Admit: 2019-09-03 | Discharge: 2019-09-03 | Disposition: A | Discharge status: Home / Self Care | Payer: 59 | Source: Ambulatory Visit

## 2019-09-03 DIAGNOSIS — F418 Other specified anxiety disorders: Secondary | ICD-10-CM

## 2019-09-03 DIAGNOSIS — Z1231 Encounter for screening mammogram for malignant neoplasm of breast: Secondary | ICD-10-CM

## 2019-09-03 DIAGNOSIS — I10 Essential (primary) hypertension: Secondary | ICD-10-CM

## 2019-09-03 DIAGNOSIS — N6310 Unspecified lump in the right breast, unspecified quadrant: Secondary | ICD-10-CM

## 2019-09-03 MED ORDER — ESCITALOPRAM OXALATE 20 MG PO TABS: 20.0000 mg | ORAL_TABLET | Freq: Every day | ORAL | 1 refills | Status: DC

## 2019-09-03 MED ORDER — ENALAPRIL MALEATE 20 MG PO TABS: 20.0000 mg | ORAL_TABLET | Freq: Two times a day (BID) | ORAL | 1 refills | Status: DC

## 2019-09-03 NOTE — Progress Notes (Signed)
   Subjective:  Audio only  Patient ID: Jennifer Coleman, female    DOB: 1951/06/30, 68 y.o.   MRN: [id number]  bp today 124/78 pulse 66 per pt  Hypertension This is a chronic problem. Treatments tried: enalapril 20mg  bid, hctz prn. There are no compliance problems (walks 2 miles a day, eats healthy, takes meds every day).    Pt states no concerns today.  ] Virtual Visit via Telephone Note  I connected with GERTURDE BLAKENSHIP on 09/03/19 at  8:30 AM EST by telephone and verified that I am speaking with the correct person using two identifiers.  Location: Patient: home Provider: office   I discussed the limitations, risks, security and privacy concerns of performing an evaluation and management service by telephone and the availability of in person appointments. I also discussed with the patient that there may be a patient responsible charge related to this service. The patient expressed understanding and agreed to proceed.   History of Present Illness:    Observations/Objective:   Assessment and Plan:   Follow Up Instructions:    I discussed the assessment and treatment plan with the patient. The patient was provided an opportunity to ask questions and all were answered. The patient agreed with the plan and demonstrated an understanding of the instructions.   The patient was advised to call back or seek an in-person evaluation if the symptoms worsen or if the condition fails to improve as anticipated.  I provided 20 minutes of non-face-to-face time during this encounter.   Blood pressure medicine and blood pressure levels reviewed today with patient. Compliant with blood pressure medicine. States does not miss a dose. No obvious side effects. Blood pressure generally good when checked elsewhere. Watching salt intake.   Patient notes ongoing compliance with antidepressant medication. No obvious side effects. Reports does not miss a dose. Overall continues to help depression  substantially. No thoughts of homicide or suicide. Would like to maintain medication. Patient states mood overall stable and overall good  Review of Systems No headache, no major weight loss or weight gain, no chest pain no back pain abdominal pain no change in bowel habits complete ROS otherwise negative     Objective:   Physical Exam  Virtual      Assessment & Plan:  Impression hypertension.  Para good control discussed maintain same meds  2.  Depression clinically stable maintain meds exercise encouraged  Diet exercise discussed.  Follow-up in 6 months

## 2019-09-08 ENCOUNTER — Ambulatory Visit (HOSPITAL_COMMUNITY): Payer: 59

## 2019-09-08 ENCOUNTER — Ambulatory Visit (HOSPITAL_COMMUNITY)
Admission: RE | Admit: 2019-09-08 | Discharge: 2019-09-08 | Disposition: A | Discharge status: Home / Self Care | Payer: 59 | Source: Ambulatory Visit | Attending: Family Medicine | Admitting: Family Medicine

## 2019-09-08 ENCOUNTER — Other Ambulatory Visit: Payer: Self-pay

## 2019-09-08 DIAGNOSIS — R928 Other abnormal and inconclusive findings on diagnostic imaging of breast: Secondary | ICD-10-CM | POA: Diagnosis not present

## 2019-09-08 DIAGNOSIS — N6310 Unspecified lump in the right breast, unspecified quadrant: Secondary | ICD-10-CM

## 2019-09-08 DIAGNOSIS — N6312 Unspecified lump in the right breast, upper inner quadrant: Secondary | ICD-10-CM | POA: Diagnosis not present

## 2019-09-08 DIAGNOSIS — N6001 Solitary cyst of right breast: Secondary | ICD-10-CM | POA: Diagnosis not present

## 2019-09-23 ENCOUNTER — Other Ambulatory Visit: Payer: Self-pay | Admitting: Physician Assistant

## 2019-09-23 DIAGNOSIS — L570 Actinic keratosis: Secondary | ICD-10-CM | POA: Diagnosis not present

## 2019-09-23 DIAGNOSIS — D485 Neoplasm of uncertain behavior of skin: Secondary | ICD-10-CM | POA: Diagnosis not present

## 2019-09-23 DIAGNOSIS — C44329 Squamous cell carcinoma of skin of other parts of face: Secondary | ICD-10-CM | POA: Diagnosis not present

## 2019-09-23 DIAGNOSIS — C44722 Squamous cell carcinoma of skin of right lower limb, including hip: Secondary | ICD-10-CM | POA: Diagnosis not present

## 2019-10-08 DIAGNOSIS — C44321 Squamous cell carcinoma of skin of nose: Secondary | ICD-10-CM | POA: Diagnosis not present

## 2019-10-14 ENCOUNTER — Other Ambulatory Visit: Payer: Self-pay

## 2019-10-14 ENCOUNTER — Encounter: Payer: Self-pay | Admitting: Dietician

## 2019-10-14 ENCOUNTER — Encounter: Payer: 59 | Attending: Surgery | Admitting: Dietician

## 2019-10-14 ENCOUNTER — Ambulatory Visit (INDEPENDENT_AMBULATORY_CARE_PROVIDER_SITE_OTHER): Payer: 59 | Admitting: Orthopaedic Surgery

## 2019-10-14 ENCOUNTER — Ambulatory Visit: Payer: Self-pay

## 2019-10-14 ENCOUNTER — Encounter: Payer: Self-pay | Admitting: Orthopaedic Surgery

## 2019-10-14 DIAGNOSIS — Z713 Dietary counseling and surveillance: Secondary | ICD-10-CM | POA: Diagnosis not present

## 2019-10-14 DIAGNOSIS — M25562 Pain in left knee: Secondary | ICD-10-CM | POA: Diagnosis not present

## 2019-10-14 DIAGNOSIS — Z9884 Bariatric surgery status: Secondary | ICD-10-CM

## 2019-10-14 DIAGNOSIS — Z6838 Body mass index (BMI) 38.0-38.9, adult: Secondary | ICD-10-CM | POA: Diagnosis not present

## 2019-10-14 NOTE — Progress Notes (Signed)
Office Visit Note   Patient: Jennifer Coleman           Date of Birth: [birth year]           MRN: [id number] Visit Date: 10/14/2019              Requested by: Mikey Kirschner, [address] [place],  [place] [address] PCP: Mikey Kirschner, MD   Assessment & Plan: Visit Diagnoses:  1. 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  I will inject the knee with cortisone and monitor response.  I am also going to order an MRI scan as it certainly is a possibility she could have a meniscal tear in addition to the arthritis which is relatively mild by film Follow-Up Instructions: No follow-ups on file.   Orders:  Orders Placed This Encounter  Procedures  . XR KNEE 3 VIEW LEFT   No orders of the defined types were placed in this encounter.     Procedures: No procedures performed   Clinical Data: No additional findings.   Subjective: Chief Complaint  Patient presents with  . Left Knee - Pain  Nicole Kindred fell in July landing directly on the anterior aspect of her left knee.  She has been having trouble with that knee ever since that time.  She has had particular trouble going up and down stairs and inclines.  Her knee will pop and catch particularly anteriorly.  Not having any specific medial or lateral joint pain.  HPI  Review of Systems   Objective: Vital Signs: There were no vitals taken for this visit.  Physical Exam Constitutional:      Appearance: She is well-developed.  Eyes:     Pupils: Pupils are equal, round, and reactive to light.  Pulmonary:     Effort: Pulmonary effort is normal.  Skin:    General: Skin is warm and dry.  Neurological:     Mental Status: She is alert and oriented to person, place, and time.  Psychiatric:        Behavior: Behavior normal.     Ortho Exam awake alert and oriented x3.  Comfortable sitting.  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  Left knee without effusion.  Large knee.  Positive patella compression pain and crepitation.  Mild medial and lateral joint pain.  No opening with varus and valgus stress some venous stasis changes distally in the right leg but no edema.  Motor and sensory exam intact.  Straight leg raise negative.  Painless range of motion both hips  Specialty Comments:  No specialty comments available.  Imaging: No results found.   PMFS History: Patient Active Problem List   Diagnosis Date Noted  . Pain in left knee 10/14/2019  . 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 Fracture of radial head, left, closed 05/27/2019  . Traumatic hematoma of knee 05/27/2019  . Postoperative anemia due to acute blood loss 11/24/2018  . Acute cholecystitis 11/20/2018  . Impingement syndrome of left shoulder 09/30/2018  . Unspecified rotator cuff tear or rupture of right shoulder, not specified as traumatic 09/30/2018  . Acromioclavicular joint arthritis 09/30/2018  . Sun-damaged skin 01/03/2018  . Morbid obesity ([place]) 11/05/2017  . Depression with anxiety 11/05/2017  . Nontraumatic incomplete tear of right rotator cuff 09/25/2016  . Foreign body of shoulder right  09/25/2016  . 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 Shoulder joint painful on movement, right 08/29/2016  . Epigastric pain 08/11/2015  . Chest pain, rule out acute myocardial infarction 08/11/2015  . Renal insufficiency 08/11/2015  . Venous stasis 04/13/2015  . Depression 04/13/2015  . Essential hypertension 09/12/2014  . PVCs (premature ventricular contractions) 09/07/2014  . Precordial pain 09/07/2014   Past Medical History:  Diagnosis Date  . Anxiety   . Arthritis   . Dysrhythmia    PVCs  . Family history of anesthesia complication 25 yrs ago   Sister allergic to sccinocholine - unable to come off ventilator  . Hypertension   . Pneumonia   . PONV (postoperative nausea and vomiting)   . Premature  ventricular contractions   . Shingles   . Varicose veins   . Varicose veins of left lower extremity     Family History  Problem Relation Age of Onset  . Heart disease Sister   . Heart disease Brother        24's  . Diabetes Mellitus II Sister   . Multiple myeloma Mother   . Hypertension Sister   . Hypertension Father   . Heart attack Father     Past Surgical History:  Procedure Laterality Date  . CARPAL TUNNEL RELEASE    . CERVICAL DISC SURGERY  1992   [place]  . CHOLECYSTECTOMY N/A 11/21/2018   Procedure: LAPAROSCOPIC CHOLECYSTECTOMY;  Surgeon: Virl Cagey, MD;  Location: [facility];  Service: General;  Laterality: N/A;  . COLONOSCOPY N/A 06/07/2016   Procedure: COLONOSCOPY;  Surgeon: Rogene Houston, MD;  Location: [facility];  Service: Endoscopy;  Laterality: N/A;  1:00  . KNEE ARTHROSCOPY  09/16/2012   Procedure: ARTHROSCOPY KNEE;  Surgeon: [name], MD;  Location: [facility];  Service: Orthopedics;  Laterality: Right;  Right Knee Arthroscopy  . LAPAROSCOPIC GASTRIC SLEEVE RESECTION N/A 08/17/2019   Procedure: LAPAROSCOPIC GASTRIC SLEEVE RESECTION AND HIATAL HERNIA REPAIR, UPPPER ENDO AND ERAS PATHWAY;  Surgeon: Johnathan Hausen, MD;  Location: [facility];  Service: General;  Laterality: N/A;  . SALPINGOOPHORECTOMY Left 1985  . SHOULDER ARTHROSCOPY WITH OPEN ROTATOR CUFF REPAIR AND DISTAL CLAVICLE ACROMINECTOMY Right 05/03/2016   Procedure: RIGHT SHOULDER ARTHROSCOPY WITH MINI-OPEN ROTATOR CUFF REPAIR,DISTAL CLAVICLE RESECTION, SUBACROMIAL DECOMPRESSION.;  Surgeon: [name], MD;  Location: [place];  Service: Orthopedics;  Laterality: Right;  . SHOULDER ARTHROSCOPY WITH ROTATOR CUFF REPAIR Right 09/25/2016   Procedure: Right Shoulder Manipulation, Arthroscopic Debridement of Joint, Removal of Loose Body and Re-Repair of Rotator Cuff;  Surgeon: [name], MD;  Location: [place];  Service: Orthopedics;  Laterality: Right;   Social History    Occupational History  . Occupation: Civil Service fast streamer  Tobacco Use  . Smoking status: Former Smoker    Types: Cigarettes    Quit date: 09/22/1992    Years since quitting: 27.0  . Smokeless tobacco: Never Used  Substance and Sexual Activity  . Alcohol use: No    Alcohol/week: 0.0 standard drinks  . Drug use: No  . Sexual activity: Not Currently     [name], MD   Note - This record has been created using [employer].  Chart creation errors have been sought, but may not always  have been located. Such creation errors do not reflect on  the standard of medical care.

## 2019-10-14 NOTE — Patient Instructions (Signed)

## 2019-10-14 NOTE — Addendum Note (Signed)
Addended by: Lendon Collar on: 10/14/2019 03:03 PM   Modules accepted: Orders

## 2019-10-14 NOTE — Progress Notes (Signed)
Bariatric Nutrition Follow-Up Visit Medical Nutrition Therapy  Appt Start Time: 4:05pm   End Time: 4:35pm  2 Months Post-Operative Sleeve Surgery Surgery Date: 08/17/2019  Pt's Expectations of Surgery/ Goals: to lose weight and help relieve pain    NUTRITION ASSESSMENT  Anthropometrics  Start weight at [facility]: 249 lbs (date: 11/11/2018) Today's weight: 216.8 lbs  Body Composition Scale 09/02/2019 10/14/2019  Weight  lbs 230 216.8  BMI 38.3 35.3  Total Body Fat  %  43.5     Visceral Fat  15  Fat-Free Mass  %  56.4     Total Body Water  %  42.7     Muscle-Mass  lbs  29.5  Body Fat Displacement --- ---         Torso  lbs  58.4         Left Leg  lbs  11.6         Right Leg  lbs  11.6         Left Arm  lbs  5.8         Right Arm  lbs  5.8     Lifestyle & Dietary Hx Breakfast may be an omelet (egg with cheese) or may have [place] bacon with an egg or some cottage cheese.  May have ham and cheese roll up for lunch if did not have it with breakfast. May have tuna or other fish. Patient states most foods are tolerated well. Meeting daily protein goal, not meeting daily fluid goal. Walking as much as able to with pain.   24-Hr Dietary Recall First Meal: fried egg + ham  Snack: sugar free jello   Second Meal: chicken + beans  Snack: protein shake   Third Meal: tuna  Snack: - Beverages: water, Vitamin Water Zero  Estimated daily fluid intake: 48-60 oz Estimated daily protein intake: 60+ g Supplements: bariatric MVI, calcium  Current average weekly physical activity: walking   Post-Op Goals/ Signs/ Symptoms Using straws: no Drinking while eating: no Chewing/swallowing difficulties: no Changes in vision: no Changes to mood/headaches: no Hair loss/changes to skin/nails: no Difficulty focusing/concentrating: no Sweating: no Dizziness/lightheadedness: no  Palpitations: no  Carbonated/caffeinated beverages: no N/V/D/C/Gas: constipation  Abdominal pain: no Dumping syndrome:  no   NUTRITION DIAGNOSIS  Overweight/obesity ([place]-3.3) related to past poor dietary habits and physical inactivity as evidenced by completed bariatric surgery and following dietary guidelines for continued weight loss and healthy nutrition status.   NUTRITION INTERVENTION Nutrition counseling (C-1) and education (E-2) to facilitate bariatric surgery goals, including: . Diet advancement to the next phase (phase 4) now including non-starchy vegetables . The importance of consuming adequate calories as well as certain nutrients daily due to the body's need for essential vitamins, minerals, and fats . The importance of daily physical activity and to reach a goal of at least 150 minutes of moderate to vigorous physical activity weekly (or as directed by their physician) due to benefits such as increased musculature and improved lab values  Handouts Provided Include   Phase 4: Protein + Non-Starchy   Learning Style & Readiness for Change Teaching method utilized: Visual & Auditory  Demonstrated degree of understanding via: Teach Back  Barriers to learning/adherence to lifestyle change: None Identified     MONITORING & EVALUATION Dietary intake, weekly physical activity, body weight, and goals in 4 months.  Next Steps Patient is to follow-up in 4 months for 6 month post-op follow-up.

## 2019-10-27 ENCOUNTER — Ambulatory Visit (HOSPITAL_COMMUNITY)
Admission: RE | Admit: 2019-10-27 | Discharge: 2019-10-27 | Disposition: A | Discharge status: Home / Self Care | Payer: 59 | Source: Ambulatory Visit | Attending: Orthopaedic Surgery | Admitting: Orthopaedic Surgery

## 2019-10-27 ENCOUNTER — Other Ambulatory Visit: Payer: Self-pay

## 2019-10-27 DIAGNOSIS — M25562 Pain in left knee: Secondary | ICD-10-CM | POA: Insufficient documentation

## 2019-10-28 ENCOUNTER — Encounter: Payer: Self-pay | Admitting: Orthopaedic Surgery

## 2019-10-28 ENCOUNTER — Ambulatory Visit (INDEPENDENT_AMBULATORY_CARE_PROVIDER_SITE_OTHER): Payer: 59 | Admitting: Orthopaedic Surgery

## 2019-10-28 VITALS — Ht 65.0 in | Wt 216.0 lb

## 2019-10-28 DIAGNOSIS — M25562 Pain in left knee: Secondary | ICD-10-CM

## 2019-10-28 DIAGNOSIS — G8929 Other chronic pain: Secondary | ICD-10-CM | POA: Diagnosis not present

## 2019-10-28 NOTE — Progress Notes (Signed)
Office Visit Note   Patient: Jennifer Coleman           Date of Birth: Jul 15, 1951           MRN: [id number] Visit Date: 10/28/2019              Requested by: Mikey Kirschner, [place],  [place] [id number] PCP: Mikey Kirschner, MD   Assessment & Plan: Visit Diagnoses:  1. Chronic pain of left knee     Plan: MRI scan of the left knee demonstrated a mildly progressive horizontal tear of the body and posterior horn of the medial meniscus and a new radial tear near the posterior horn.  She also had a new horizontal tear of the body of the lateral meniscus.  There were mild degenerative changes in all 3 compartments with a small focal full-thickness area of cartilage defect in the lateral femoral condyle.  No joint effusion.  Ligaments were intact.  No acute fracture or dislocation or suspicious bone lesions.  [name] relates that her pain is better since she had a cortisone injection.  She still has little trouble going up and down stairs which might be related to her patella.  We had a long discussion regarding the findings and the different treatment options including exercises and/or physical therapy.  Have also discussed knee arthroscopy as an option based on her discomfort and compromise.  I would not suggest that as yet.  She will continue with her home exercise program per her own volition and the Voltaren gel and return as needed.  Is not compromised enough at this point that she wants to consider surgery  Follow-Up Instructions: Return if symptoms worsen or fail to improve.   Orders:  No orders of the defined types were placed in this encounter.  No orders of the defined types were placed in this encounter.     Procedures: No procedures performed   Clinical Data: No additional findings.   Subjective: Chief Complaint  Patient presents with  . Left Knee - Follow-up    MRI results  Patient presents today for follow up on her left knee. She had  an MRI and is here today to go over those results.  Symptoms are essentially unchanged.  She does have little trouble with the medial lateral joint pain but also some pain anteriorly when she maneuvers up and down stairs. HPI  Review of Systems   Objective: Vital Signs: Ht '5\' 5"'$  (1.651 m)   Wt 216 lb (98 kg)   BMI 35.94 kg/m   Physical Exam Constitutional:      Appearance: She is well-developed.  Eyes:     Pupils: Pupils are equal, round, and reactive to light.  Pulmonary:     Effort: Pulmonary effort is normal.  Skin:    General: Skin is warm and dry.  Neurological:     Mental Status: She is alert and oriented to person, place, and time.  Psychiatric:        Behavior: Behavior normal.     Ortho Exam large knees.  No obvious effusion the left knee.  There is some lateral and medial joint pain more posteriorly which could be related to the meniscal tears.  There is some patellar crepitation and anterior pain with flexion extension of her hip.  Some venous stasis changes distally in her leg.  No calf pain.  No popliteal mass or pain.  Full extension and flexion of approximately 100  degrees.  No hip pain with range of motion.  Straight leg raise negative  Specialty Comments:  No specialty comments available.  Imaging: MR Knee Left w/o contrast  Result Date: 10/28/2019 CLINICAL DATA:  Chronic left knee pain. EXAM: MRI OF THE LEFT KNEE WITHOUT CONTRAST TECHNIQUE: Multiplanar, multisequence MR imaging of the knee was performed. No intravenous contrast was administered. COMPARISON:  Left knee x-rays dated May 11, 2019. Left knee MRI dated August 07, 2012. FINDINGS: MENISCI Medial meniscus: Mildly progressive horizontal tear of the body and posterior horn. New radial tear of the posterior horn. Lateral meniscus:  New horizontal tear of the body. LIGAMENTS Cruciates:  Intact ACL and PCL. Collaterals: Medial collateral ligament is intact. Lateral collateral ligament complex is intact.  CARTILAGE Patellofemoral:  Mild partial-thickness cartilage loss. Medial:  Mild partial-thickness cartilage loss. Lateral: Mild partial-thickness cartilage loss with small focal full-thickness cartilage defect over the posterior weight-bearing lateral femoral condyle. Joint: No joint effusion. Normal Hoffa's fat. No plical thickening. Popliteal Fossa:  No Baker cyst. Intact popliteus tendon. Extensor Mechanism: Intact quadriceps tendon and patellar tendon. Intact medial and lateral patellar retinaculum. Intact MPFL. Bones: No acute fracture or dislocation. No suspicious bone lesion. Other: Unchanged in the semimembranosus-tibial collateral ligament bursa. IMPRESSION: 1. Mildly progressive horizontal tear of the medial meniscus body and posterior horn. New radial tear of the posterior horn. 2. New horizontal tear of the lateral meniscus body. 3. Mild tricompartmental osteoarthritis. 4. Unchanged semimembranosus-tibial collateral ligament bursitis. Electronically Signed   By: Titus Dubin M.D.   On: 10/28/2019 08:42     PMFS History: Patient Active Problem List   Diagnosis Date Noted  . Pain in left knee 10/14/2019  . 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 Fracture of radial head, left, closed 05/27/2019  . Traumatic hematoma of knee 05/27/2019  . Postoperative anemia due to acute blood loss 11/24/2018  . Acute cholecystitis 11/20/2018  . Impingement syndrome of left shoulder 09/30/2018  . Unspecified rotator cuff tear or rupture of right shoulder, not specified as traumatic 09/30/2018  . Acromioclavicular joint arthritis 09/30/2018  . Sun-damaged skin 01/03/2018  . Morbid obesity ([place]) 11/05/2017  . Depression with anxiety 11/05/2017  . Nontraumatic incomplete tear of right rotator cuff 09/25/2016  . Foreign body of shoulder right  09/25/2016  . 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 Shoulder joint painful on movement, right 08/29/2016  . Epigastric pain 08/11/2015   . Chest pain, rule out acute myocardial infarction 08/11/2015  . Renal insufficiency 08/11/2015  . Venous stasis 04/13/2015  . Depression 04/13/2015  . Essential hypertension 09/12/2014  . PVCs (premature ventricular contractions) 09/07/2014  . Precordial pain 09/07/2014   Past Medical History:  Diagnosis Date  . Anxiety   . Arthritis   . Dysrhythmia    PVCs  . Family history of anesthesia complication 25 yrs ago   Sister allergic to sccinocholine - unable to come off ventilator  . Hypertension   . Pneumonia   . PONV (postoperative nausea and vomiting)   . Premature ventricular contractions   . Shingles   . Varicose veins   . Varicose veins of left lower extremity     Family History  Problem Relation Age of Onset  . Heart disease Sister   . Heart disease Brother        28's  . Diabetes Mellitus II Sister   . Multiple myeloma Mother   . Hypertension Sister   . Hypertension Father   . Heart attack Father  Past Surgical History:  Procedure Laterality Date  . CARPAL TUNNEL RELEASE    . CERVICAL DISC SURGERY  1992   Fusion  . CHOLECYSTECTOMY N/A 11/21/2018   Procedure: LAPAROSCOPIC CHOLECYSTECTOMY;  Surgeon: Virl Cagey, MD;  Location: [facility];  Service: General;  Laterality: N/A;  . COLONOSCOPY N/A 06/07/2016   Procedure: COLONOSCOPY;  Surgeon: Rogene Houston, MD;  Location: [facility];  Service: Endoscopy;  Laterality: N/A;  1:00  . KNEE ARTHROSCOPY  09/16/2012   Procedure: ARTHROSCOPY KNEE;  Surgeon: [name], MD;  Location: [facility];  Service: Orthopedics;  Laterality: Right;  Right Knee Arthroscopy  . LAPAROSCOPIC GASTRIC SLEEVE RESECTION N/A 08/17/2019   Procedure: LAPAROSCOPIC GASTRIC SLEEVE RESECTION AND HIATAL HERNIA REPAIR, UPPPER ENDO AND ERAS PATHWAY;  Surgeon: Johnathan Hausen, MD;  Location: [facility];  Service: General;  Laterality: N/A;  . SALPINGOOPHORECTOMY Left 1985  . SHOULDER ARTHROSCOPY WITH OPEN ROTATOR CUFF REPAIR AND DISTAL CLAVICLE  ACROMINECTOMY Right 05/03/2016   Procedure: RIGHT SHOULDER ARTHROSCOPY WITH MINI-OPEN ROTATOR CUFF REPAIR,DISTAL CLAVICLE RESECTION, SUBACROMIAL DECOMPRESSION.;  Surgeon: [name], MD;  Location: [facility];  Service: Orthopedics;  Laterality: Right;  . SHOULDER ARTHROSCOPY WITH ROTATOR CUFF REPAIR Right 09/25/2016   Procedure: Right Shoulder Manipulation, Arthroscopic Debridement of Joint, Removal of Loose Body and Re-Repair of Rotator Cuff;  Surgeon: [name], MD;  Location: [facility];  Service: Orthopedics;  Laterality: Right;   Social History   Occupational History  . Occupation: [employer]  Tobacco Use  . Smoking status: Former Smoker    Types: Cigarettes    Quit date: 09/22/1992    Years since quitting: 27.1  . Smokeless tobacco: Never Used  Substance and Sexual Activity  . Alcohol use: No    Alcohol/week: 0.0 standard drinks  . Drug use: No  . Sexual activity: Not Currently

## 2019-10-29 DIAGNOSIS — C44722 Squamous cell carcinoma of skin of right lower limb, including hip: Secondary | ICD-10-CM | POA: Diagnosis not present

## 2019-10-29 DIAGNOSIS — L570 Actinic keratosis: Secondary | ICD-10-CM | POA: Diagnosis not present

## 2019-11-13 ENCOUNTER — Other Ambulatory Visit: Payer: Self-pay

## 2019-11-13 ENCOUNTER — Ambulatory Visit (INDEPENDENT_AMBULATORY_CARE_PROVIDER_SITE_OTHER): Payer: 59 | Admitting: Nurse Practitioner

## 2019-11-13 VITALS — BP 122/78 | Temp 97.2°F | Ht 65.0 in | Wt 211.4 lb

## 2019-11-13 DIAGNOSIS — Z780 Asymptomatic menopausal state: Secondary | ICD-10-CM

## 2019-11-13 DIAGNOSIS — Z01419 Encounter for gynecological examination (general) (routine) without abnormal findings: Secondary | ICD-10-CM

## 2019-11-13 DIAGNOSIS — Z13220 Encounter for screening for lipoid disorders: Secondary | ICD-10-CM | POA: Diagnosis not present

## 2019-11-13 DIAGNOSIS — Z79899 Other long term (current) drug therapy: Secondary | ICD-10-CM | POA: Diagnosis not present

## 2019-11-13 DIAGNOSIS — R5383 Other fatigue: Secondary | ICD-10-CM | POA: Diagnosis not present

## 2019-11-13 NOTE — Progress Notes (Signed)
   Subjective:    Patient ID: GLORI GREELY, female    DOB: May 09, 1951, 69 y.o.   MRN: [id number]  HPI  The patient comes in today for a wellness visit.    A review of their health history was completed.  A review of medications was also completed.  Any needed refills; yes  Eating habits: going good  Falls/  MVA accidents in past few months: fell and broke arm on vacation over the summer  Regular exercise: walking and exercise bike  Specialist pt sees on regular basis: none  Preventative health issues were discussed.   Additional concerns: none    Review of Systems     Objective:   Physical Exam        Assessment & Plan:

## 2019-11-13 NOTE — Progress Notes (Signed)
   Subjective:    Patient ID: Jennifer Coleman, female    DOB: [birth year], 69 y.o.   MRN: [id number]  HPI patient presents today for annual wellness exam. She had gastric sleeve surgery in October 2020. She reports tolerating it well, denies any n/v with regular daily bowel movements, and has lost 30lbs. She reports regular dental exams and eye exams. Her mammogram, pap smear, and colonoscopy are up to date. Patient reports regular skin cancer screenings with dermatology and has had several places removed. She denies any additional concerns today.  Same sexual partner.   Review of Systems  General: Patient reports adequate energy level and regular sleeping pattern.  HEENT: Patient denies vision changes, hearing changes, or nasal congestion. Patient denies sore throat or difficulty swallowing.  Cardiac: Patient denies chest pain, pressure, or palpitations. Patient denies Syncope or dizziness.  Pulmonary: Patient denies shortness of breath, dyspnea, or cough.  GI: Patient denies reflux or N/V/D and reports regular bowel movements.  GU: Patient denies frequency, urgency, or change in color or odor of urine.  Breast: Patient denies tenderness, pain, or swelling. Patient denies nipple discharge, swelling, or tenderness.  GYN: Patient denies vaginal bleeding, itching, or vaginal discomfort.     Objective:   Physical Exam  General: WDWN. Appears calm and pleasant demeanor.   Thyroid: midline without visible enlargement, nodules, or tenderness on palpation.  Lymph: cervical lymph nodes without enlargement, or tenderness. Pulmonary: No adventitious sounds on auscultation. Chest expansion symmetrical Cardiovascular: RRR. S1, S2 intact. No murmurs. No peripheral edema.  Abdomen: Flat, soft, non-tender. No masses, tenderness or organomegaly on palpation.  Breast: breasts symmetrical and smooth without nodules or masses. Nipples symmetrical without discharge or deformity. No tenderness noted to palpation.  Axilla lymph nodes without enlargement or tenderness.  GYN: External genitalia without erythema, lesions, swelling or masses. Urethra without erythema, and midline. Vaginal mucosa pink without discharge. No CMT noted. Uterus palpated by bimanual exam, midline, and without enlargement or masses. Exam limited due to abdominal girth.      Assessment & Plan:   Well woman exam  Fatigue, unspecified type - Plan: CBC with Diff, Vitamin B12, Ferritin, CANCELED: Comprehensive metabolic panel  High risk medication use - Plan: CBC with Diff, Comprehensive metabolic panel, CANCELED: Comprehensive metabolic panel  Screening, lipid - Plan: Lipid Profile  Post-menopausal - Plan: DG Bone Density  Labs: CMP, lipid, CBC, Vit B12, and ferritin. Dexascan ordered  Education: Continue healthy diet and regular exercise.  Follow Up: we will be in touch with the lab results.  Return in about 6 months (around 05/12/2020).

## 2019-11-14 LAB — CBC WITH DIFFERENTIAL/PLATELET
Basophils Absolute: 0 10*3/uL (ref 0.0–0.2)
Basos: 0 %
EOS (ABSOLUTE): 0.1 10*3/uL (ref 0.0–0.4)
Eos: 1 %
Hematocrit: 42.5 % (ref 34.0–46.6)
Hemoglobin: 13.9 g/dL (ref 11.1–15.9)
Immature Grans (Abs): 0 10*3/uL (ref 0.0–0.1)
Immature Granulocytes: 0 %
Lymphocytes Absolute: 2.6 10*3/uL (ref 0.7–3.1)
Lymphs: 35 %
MCH: 31.6 pg (ref 26.6–33.0)
MCHC: 32.7 g/dL (ref 31.5–35.7)
MCV: 97 fL (ref 79–97)
Monocytes Absolute: 0.5 10*3/uL (ref 0.1–0.9)
Monocytes: 7 %
Neutrophils Absolute: 4.2 10*3/uL (ref 1.4–7.0)
Neutrophils: 57 %
Platelets: 249 10*3/uL (ref 150–450)
RBC: 4.4 x10E6/uL (ref 3.77–5.28)
RDW: 13.8 % (ref 11.7–15.4)
WBC: 7.5 10*3/uL (ref 3.4–10.8)

## 2019-11-14 LAB — LIPID PANEL
Chol/HDL Ratio: 2.4 ratio (ref 0.0–4.4)
Cholesterol, Total: 229 mg/dL — ABNORMAL HIGH (ref 100–199)
HDL: 97 mg/dL (ref >39)
LDL Chol Calc (NIH): 118 mg/dL — ABNORMAL HIGH (ref 0–99)
Triglycerides: 84 mg/dL (ref 0–149)
VLDL Cholesterol Cal: 14 mg/dL (ref 5–40)

## 2019-11-14 LAB — VITAMIN B12: Vitamin B-12: 632 pg/mL (ref 232–1245)

## 2019-11-14 LAB — FERRITIN: Ferritin: 227 ng/mL — ABNORMAL HIGH (ref 15–150)

## 2019-11-15 ENCOUNTER — Encounter: Payer: Self-pay | Admitting: Nurse Practitioner

## 2019-11-17 LAB — SPECIMEN STATUS REPORT

## 2019-11-19 ENCOUNTER — Other Ambulatory Visit (HOSPITAL_COMMUNITY): Payer: 59

## 2019-11-23 LAB — SPECIMEN STATUS REPORT

## 2019-11-23 LAB — COMPREHENSIVE METABOLIC PANEL
ALT: 15 IU/L (ref 0–32)
AST: 20 IU/L (ref 0–40)
Albumin/Globulin Ratio: 2 (ref 1.2–2.2)
Albumin: 4.3 g/dL (ref 3.8–4.8)
Alkaline Phosphatase: 99 IU/L (ref 39–117)
BUN/Creatinine Ratio: 26 (ref 12–28)
BUN: 23 mg/dL (ref 8–27)
Bilirubin Total: 0.4 mg/dL (ref 0.0–1.2)
CO2: 21 mmol/L (ref 20–29)
Calcium: 10.4 mg/dL — ABNORMAL HIGH (ref 8.7–10.3)
Chloride: 104 mmol/L (ref 96–106)
Creatinine, Ser: 0.9 mg/dL (ref 0.57–1.00)
GFR calc Af Amer: 77 mL/min/{1.73_m2} (ref >59)
GFR calc non Af Amer: 67 mL/min/{1.73_m2} (ref >59)
Globulin, Total: 2.1 g/dL (ref 1.5–4.5)
Glucose: 83 mg/dL (ref 65–99)
Potassium: 4.2 mmol/L (ref 3.5–5.2)
Sodium: 143 mmol/L (ref 134–144)
Total Protein: 6.4 g/dL (ref 6.0–8.5)

## 2019-11-25 ENCOUNTER — Ambulatory Visit (HOSPITAL_COMMUNITY)
Admission: RE | Admit: 2019-11-25 | Discharge: 2019-11-25 | Disposition: A | Discharge status: Home / Self Care | Payer: 59 | Source: Ambulatory Visit | Attending: Nurse Practitioner | Admitting: Nurse Practitioner

## 2019-11-25 ENCOUNTER — Other Ambulatory Visit: Payer: Self-pay

## 2019-11-25 DIAGNOSIS — Z780 Asymptomatic menopausal state: Secondary | ICD-10-CM | POA: Diagnosis not present

## 2019-11-25 DIAGNOSIS — M8580 Other specified disorders of bone density and structure, unspecified site: Secondary | ICD-10-CM | POA: Diagnosis not present

## 2019-11-25 DIAGNOSIS — M8589 Other specified disorders of bone density and structure, multiple sites: Secondary | ICD-10-CM | POA: Diagnosis not present

## 2019-11-28 MED FILL — ENALAPRIL MALEATE 20 MG TAB: 20 | 90 days supply | Qty: 180 | Fill #0

## 2019-11-28 MED FILL — ESCITALOPRAM 20 MG TABLET: 20 | 90 days supply | Qty: 90 | Fill #0

## 2019-12-03 ENCOUNTER — Encounter: Payer: Self-pay | Admitting: Family Medicine

## 2019-12-18 ENCOUNTER — Encounter: Payer: Self-pay | Admitting: Nurse Practitioner

## 2019-12-18 DIAGNOSIS — M8580 Other specified disorders of bone density and structure, unspecified site: Secondary | ICD-10-CM | POA: Insufficient documentation

## 2019-12-28 ENCOUNTER — Telehealth: Payer: Self-pay | Admitting: Family Medicine

## 2019-12-28 NOTE — Telephone Encounter (Signed)
Her last calcium was just above normal. Continue same dose of calcium, just recommend not increasing it. Thanks.

## 2019-12-28 NOTE — Telephone Encounter (Signed)
Pt had CPE in January and lab work she was told her calcium was elevated and to cut back on it.   She just had a bone density test and has been diagnosis with osteoporosis.  She is concerned now about cutting her calcium back after her bone density scan. She would like to know what she needs to do.

## 2019-12-28 NOTE — Telephone Encounter (Signed)
Has she decided on whether to start medication to prevent progression of osteoporosis? Because of her gastric surgery, we can see if her insurance will cover an infusion. Thanks.

## 2019-12-29 NOTE — Telephone Encounter (Signed)
Patient would like to try the Fosamax once a week and if has problems with that will go to infusion. Patient would like Fosamax sent to [facility] long outpatient and put please mail in comments

## 2019-12-29 NOTE — Telephone Encounter (Signed)
Left message to return call 

## 2019-12-30 ENCOUNTER — Other Ambulatory Visit: Payer: Self-pay | Admitting: Nurse Practitioner

## 2019-12-30 DIAGNOSIS — M810 Age-related osteoporosis without current pathological fracture: Secondary | ICD-10-CM | POA: Insufficient documentation

## 2019-12-30 MED ORDER — ALENDRONATE SODIUM 70 MG PO TABS: 70.0000 mg | ORAL_TABLET | ORAL | 3 refills | Status: DC

## 2019-12-30 MED FILL — ALENDRONATE NA 70 MG TAB: 70 | 84 days supply | Qty: 12 | Fill #0

## 2019-12-30 NOTE — Telephone Encounter (Signed)
Just sent in. Please ask her to watch carefully due to her history of weight loss surgery.  Take with a full glass of water. Sit up for at least 30 minutes after taking medicine. Stop med and contact office if any significant reflux symptoms.

## 2019-12-30 NOTE — Telephone Encounter (Signed)
Called and discussed with pt and she verbalized understanding.

## 2020-01-22 ENCOUNTER — Ambulatory Visit: Payer: 59 | Admitting: Physician Assistant

## 2020-02-09 ENCOUNTER — Encounter: Payer: 59 | Attending: Surgery | Admitting: Dietician

## 2020-02-09 ENCOUNTER — Encounter: Payer: Self-pay | Admitting: Dietician

## 2020-02-09 ENCOUNTER — Other Ambulatory Visit: Payer: Self-pay

## 2020-02-09 ENCOUNTER — Ambulatory Visit: Payer: 59

## 2020-02-09 DIAGNOSIS — Z9884 Bariatric surgery status: Secondary | ICD-10-CM | POA: Insufficient documentation

## 2020-02-09 NOTE — Patient Instructions (Signed)
.   Continue to aim for a minimum of 64 fluid ounces daily with at least 32 ounces being plain water . Eat non-starchy vegetables 2 times a day 7 days a week . Per meal/snack, eat 2-3 ounces of protein first  o  Add starchy vegetables (potatoes, sweet potatoes, corn, peas) as desired as long as you are able to meet your daily protein goal and eat non-starchy vegetables 2+ times per day . Continue to aim for 30 minutes of physical activity at least 5 times a week . Remember to take 3 calcium's plus your bariatric multivitamin DAILY  

## 2020-02-09 NOTE — Progress Notes (Signed)
6 Month Post-Op Class  Medical Nutrition Therapy Start Time: 2:00pm     End Time: 3:15pm  Primary Concerns Today: 6 Months Post-Operative Bariatric Surgery Nutrition Education   Surgery Date: 08/17/2019 Surgery Type: Sleeve  Body Composition Scale 09/02/2019 10/14/2019 02/09/2020  Weight  lbs 230 216.8 200.6  BMI 38.3 35.3 33  Total Body Fat  %  43.5 41.7     Visceral Fat  15 13  Fat-Free Mass  %  56.4  58.2      Total Body Water  %  42.7 43.6     Muscle-Mass  lbs  29.5 29  Body Fat Displacement  --- ---         Torso  lbs  58.4 51.7         Left Leg  lbs  11.6 10.3         Right Leg  lbs  11.6  10.3         Left Arm  lbs  5.8 5.1         Right Arm  lbs  5.8 5.1   Fluid Intake: 64 oz Supplementation: bariatric MVI, calcium (reduced to 2/day because latest labs revealed high serum calcium) Recent Physical Activity: walking   Drinking while eating: no Chewing/swallowing difficulties: no Changes in vision: no Changes to mood/headaches: no Hair loss/Changes to skin/nails: hair loss Any difficulty focusing or concentrating: no Sweating: no Dizziness/Lightheadedness: no Palpitations: no  Carbonated beverages: no N/V/D/C/Gas: constipation  Abdominal Pain: no Dumping syndrome: no   Progress Towards Pt Chosen Goals: in progress    Information Reviewed/ Discussed During Appointment: - Review of composition scale numbers - Fluid requirements (> 64-100 ounces) - Protein requirements (> 60-80 grams) - Strategies for tolerating diet - Advancement of diet to include starchy vegetables - Barriers to inclusion of new foods - Inclusion of appropriate multivitamin and calcium supplements  - Exercise recommendations   Handouts Provided Include:  Phase V: Protein + All Vegetables  The Benefits of Exercise are Endless  Should I Eat?   Measurements & Portion Sizes   Bariatric Resources (Apps & Websites)   Teaching Method Utilized: Visual & Auditory Demonstrated Degree of  Understanding via: Teach Back   Monitoring/Evaluation:  Dietary intake, exercise, and body weight.  Patient is to follow up at [facility] in 3 months for 9 month post-op visit.

## 2020-02-29 MED FILL — ENALAPRIL MALEATE 20 MG TAB: 20 | 90 days supply | Qty: 180 | Fill #1

## 2020-02-29 MED FILL — ESCITALOPRAM 20 MG TABLET: 20 | 90 days supply | Qty: 90 | Fill #1

## 2020-03-17 DIAGNOSIS — S134XXA Sprain of ligaments of cervical spine, initial encounter: Secondary | ICD-10-CM | POA: Diagnosis not present

## 2020-03-17 DIAGNOSIS — S338XXA Sprain of other parts of lumbar spine and pelvis, initial encounter: Secondary | ICD-10-CM | POA: Diagnosis not present

## 2020-03-17 DIAGNOSIS — S233XXA Sprain of ligaments of thoracic spine, initial encounter: Secondary | ICD-10-CM | POA: Diagnosis not present

## 2020-03-22 MED FILL — ALENDRONATE NA 70 MG TAB: 70 | 84 days supply | Qty: 12 | Fill #1

## 2020-05-13 ENCOUNTER — Ambulatory Visit: Payer: 59 | Admitting: Dietician

## 2020-05-24 ENCOUNTER — Encounter: Payer: Self-pay | Admitting: Dietician

## 2020-05-24 ENCOUNTER — Encounter: Payer: 59 | Attending: Surgery | Admitting: Dietician

## 2020-05-24 ENCOUNTER — Other Ambulatory Visit: Payer: Self-pay

## 2020-05-24 DIAGNOSIS — Z9884 Bariatric surgery status: Secondary | ICD-10-CM | POA: Insufficient documentation

## 2020-05-24 NOTE — Patient Instructions (Signed)
.   Continue to aim for a minimum of 64 fluid ounces daily with at least 32 ounces being plain water. . Eat non-starchy vegetables 2 times a day 7 days a week. . Per meal/snack, eat 2-3 ounces of protein first. o Add in fruit throughout the day which you may substitute starchy vegetables with (fruits and starchy vegetables are both great sources of complex carbohydrates.) o Eat fruits as long as you are able to meet your daily protein goal and still get in non-starchy vegetables 2 times per day. Whatever else you have room for may be fruit and/or starchy vegetables (potatoes, sweet potatoes, corn, and peas.)  . Continue to aim for 30 minutes of physical activity at least 5 times a week. . Remember to take 3 calcium's plus your bariatric multivitamin DAILY.  

## 2020-05-24 NOTE — Progress Notes (Signed)
Bariatric Nutrition Follow-Up Visit Medical Nutrition Therapy  Appt Start Time: 2:55pm    End Time: 3:30pm  9 Months Post-Operative Sleeve Surgery Surgery Date: 08/17/2019  Pt's Expectations of Surgery/ Goals: to lose weight and help relieve pain    NUTRITION ASSESSMENT  Anthropometrics  Start weight at [facility]: 249 lbs (date: 11/12/2019) Today's weight: 191.8 lbs  Body Composition Scale 09/02/2019 10/14/2019 02/09/2020 05/24/2020  Weight  lbs 230 216.8 200.6 191.8  BMI 38.3 35.3 33 31.6  Total Body Fat  % - 43.5 41.7 40.4     Visceral Fat - 15 13 12   Fat-Free Mass  % - 56.4  58.2  59.5     Total Body Water  % - 42.7 43.6 44.2     Muscle-Mass  lbs - 29.5 29 28.9  Body Fat Displacement --- --- --- ---         Torso  lbs - 58.4 51.7 47.9         Left Leg  lbs - 11.6 10.3 9.5         Right Leg  lbs - 11.6  10.3 9.5         Left Arm  lbs - 5.8 5.1 4.7         Right Arm  lbs - 5.8 5.1 4.7    Lifestyle & Dietary Hx Other foods eaten include [place] bacon, eggs, spinach, frozen yogurt, [place], pork, etc. States she tolerates foods and fluids well, meets protein goal most of the time. States she has only vomited once after eating taco soup too quickly and not chewing it well enough. States she had issues with hair loss. States she had hypokalemia the last time she had blood work done, so they recommended she reduce her calcium intake to 2/day. States she would like to lose about 15 more pounds.   24-Hr Dietary Recall First Meal: cottage cheese + pineapple  Snack: -  Second Meal: ham + cheese + lettuce + tomato  Snack: cherries + almonds   Third Meal: fish + green beans  Snack: premier protein + banana + almond butter (or jello)  Beverages: water, flavored water, half and half tea, coffee  Estimated daily fluid intake: 64+ oz Estimated daily protein intake: 50+ g Supplements: bariatric MVI, calcium 2x/day, biotin, hair/skin/nail supplement  Current average weekly physical  activity: walking (somewhat limited d/t back)    Post-Op Goals/ Signs/ Symptoms Using straws: no Drinking while eating: no Chewing/swallowing difficulties: no Changes in vision: no Changes to mood/headaches: no Hair loss/changes to skin/nails: hair loss Difficulty focusing/concentrating: no Sweating: no Dizziness/lightheadedness: no Palpitations: no  Carbonated/caffeinated beverages: coffee, tea  N/V/D/C/Gas: no Abdominal pain: no Dumping syndrome: no   NUTRITION DIAGNOSIS  Overweight/obesity ([place]-3.3) related to past poor dietary habits and physical inactivity as evidenced by completed bariatric surgery and following dietary guidelines for continued weight loss and healthy nutrition status.   NUTRITION INTERVENTION Nutrition counseling (C-1) and education (E-2) to facilitate bariatric surgery goals, including: . Diet advancement to the next phase (phase 6) now including fruit . The importance of consuming adequate calories as well as certain nutrients daily due to the body's need for essential vitamins, minerals, and fats . The importance of daily physical activity and to reach a goal of at least 150 minutes of moderate to vigorous physical activity weekly (or as directed by their physician) due to benefits such as increased musculature and improved lab values  Handouts Provided Include   Phase 6: Protein + All Vegetables +  Fruit   Learning Style & Readiness for Change Teaching method utilized: Visual & Auditory  Demonstrated degree of understanding via: Teach Back  Barriers to learning/adherence to lifestyle change: None Identified    MONITORING & EVALUATION Dietary intake, weekly physical activity, body weight, and goals in 3 months.  Next Steps Patient is to follow-up in 3 months for 12 month post-op follow-up.

## 2020-06-02 DIAGNOSIS — H43813 Vitreous degeneration, bilateral: Secondary | ICD-10-CM | POA: Diagnosis not present

## 2020-06-02 DIAGNOSIS — H524 Presbyopia: Secondary | ICD-10-CM | POA: Diagnosis not present

## 2020-06-02 DIAGNOSIS — H43811 Vitreous degeneration, right eye: Secondary | ICD-10-CM | POA: Diagnosis not present

## 2020-06-02 DIAGNOSIS — H52223 Regular astigmatism, bilateral: Secondary | ICD-10-CM | POA: Diagnosis not present

## 2020-06-02 DIAGNOSIS — H5203 Hypermetropia, bilateral: Secondary | ICD-10-CM | POA: Diagnosis not present

## 2020-06-02 DIAGNOSIS — H35031 Hypertensive retinopathy, right eye: Secondary | ICD-10-CM | POA: Diagnosis not present

## 2020-06-02 DIAGNOSIS — H35372 Puckering of macula, left eye: Secondary | ICD-10-CM | POA: Diagnosis not present

## 2020-06-02 DIAGNOSIS — I10 Essential (primary) hypertension: Secondary | ICD-10-CM | POA: Diagnosis not present

## 2020-06-02 DIAGNOSIS — H35033 Hypertensive retinopathy, bilateral: Secondary | ICD-10-CM | POA: Diagnosis not present

## 2020-06-07 DIAGNOSIS — S233XXA Sprain of ligaments of thoracic spine, initial encounter: Secondary | ICD-10-CM | POA: Diagnosis not present

## 2020-06-07 DIAGNOSIS — S338XXA Sprain of other parts of lumbar spine and pelvis, initial encounter: Secondary | ICD-10-CM | POA: Diagnosis not present

## 2020-06-07 DIAGNOSIS — S134XXA Sprain of ligaments of cervical spine, initial encounter: Secondary | ICD-10-CM | POA: Diagnosis not present

## 2020-06-08 DIAGNOSIS — S134XXA Sprain of ligaments of cervical spine, initial encounter: Secondary | ICD-10-CM | POA: Diagnosis not present

## 2020-06-08 DIAGNOSIS — S338XXA Sprain of other parts of lumbar spine and pelvis, initial encounter: Secondary | ICD-10-CM | POA: Diagnosis not present

## 2020-06-08 DIAGNOSIS — S233XXA Sprain of ligaments of thoracic spine, initial encounter: Secondary | ICD-10-CM | POA: Diagnosis not present

## 2020-06-13 DIAGNOSIS — S338XXA Sprain of other parts of lumbar spine and pelvis, initial encounter: Secondary | ICD-10-CM | POA: Diagnosis not present

## 2020-06-13 DIAGNOSIS — S134XXA Sprain of ligaments of cervical spine, initial encounter: Secondary | ICD-10-CM | POA: Diagnosis not present

## 2020-06-13 DIAGNOSIS — S233XXA Sprain of ligaments of thoracic spine, initial encounter: Secondary | ICD-10-CM | POA: Diagnosis not present

## 2020-06-13 MED FILL — ALENDRONATE NA 70 MG TAB: 70 | 84 days supply | Qty: 12 | Fill #2

## 2020-06-17 DIAGNOSIS — S233XXA Sprain of ligaments of thoracic spine, initial encounter: Secondary | ICD-10-CM | POA: Diagnosis not present

## 2020-06-17 DIAGNOSIS — S134XXA Sprain of ligaments of cervical spine, initial encounter: Secondary | ICD-10-CM | POA: Diagnosis not present

## 2020-06-17 DIAGNOSIS — S338XXA Sprain of other parts of lumbar spine and pelvis, initial encounter: Secondary | ICD-10-CM | POA: Diagnosis not present

## 2020-06-21 ENCOUNTER — Other Ambulatory Visit: Payer: Self-pay | Admitting: Family Medicine

## 2020-06-21 ENCOUNTER — Telehealth: Payer: Self-pay | Admitting: Family Medicine

## 2020-06-21 MED ORDER — ESCITALOPRAM OXALATE 20 MG PO TABS: 20.0000 mg | ORAL_TABLET | Freq: Every day | ORAL | 1 refills | Status: DC

## 2020-06-21 MED ORDER — ENALAPRIL MALEATE 20 MG PO TABS: 20.0000 mg | ORAL_TABLET | Freq: Two times a day (BID) | ORAL | 1 refills | Status: DC

## 2020-06-21 MED FILL — ESCITALOPRAM 20 MG TABLET: 20 | 30 days supply | Qty: 30 | Fill #0

## 2020-06-21 MED FILL — ENALAPRIL MALEATE 20 MG TAB: 20 | 30 days supply | Qty: 60 | Fill #0

## 2020-06-21 NOTE — Telephone Encounter (Signed)
Pt said that [place] sent last week Tuesday morning 08/17 and nothing has been sent back Pt is out of blood pressure meds escitalopram (LEXAPRO) 20 MG tablet, enalapril (VASOTEC) 20 MG tablet  Pt will be 48 hours with out blood pressure meds took last one yesterday morning. Called [facility] last Monday morning.  Pt [phone]

## 2020-06-21 NOTE — Telephone Encounter (Signed)
Get appt in next 30-60 days, give 30 day supply with 1 refill on each. Thx. Dr. Lovena Le

## 2020-06-21 NOTE — Telephone Encounter (Signed)
Please advise. Thank you

## 2020-06-21 NOTE — Telephone Encounter (Signed)
Refills sent to pharmacy. Pt contacted and verbalized understanding. Pt states she will call back to schedule appt.

## 2020-06-21 NOTE — Telephone Encounter (Signed)
error 

## 2020-06-23 ENCOUNTER — Other Ambulatory Visit: Payer: Self-pay

## 2020-06-23 ENCOUNTER — Other Ambulatory Visit: Payer: 59

## 2020-06-23 DIAGNOSIS — Z20822 Contact with and (suspected) exposure to covid-19: Secondary | ICD-10-CM | POA: Diagnosis not present

## 2020-06-25 LAB — NOVEL CORONAVIRUS, NAA: SARS-CoV-2, NAA: NOT DETECTED

## 2020-06-25 LAB — SARS-COV-2, NAA 2 DAY TAT

## 2020-07-06 ENCOUNTER — Telehealth: Payer: Self-pay | Admitting: Family Medicine

## 2020-07-06 ENCOUNTER — Other Ambulatory Visit: Payer: Self-pay | Admitting: Family Medicine

## 2020-07-06 MED ORDER — ESCITALOPRAM OXALATE 20 MG PO TABS: 20.0000 mg | ORAL_TABLET | Freq: Every day | ORAL | 1 refills | Status: DC

## 2020-07-06 MED ORDER — ENALAPRIL MALEATE 20 MG PO TABS: 20.0000 mg | ORAL_TABLET | Freq: Two times a day (BID) | ORAL | 1 refills | Status: DC

## 2020-07-06 NOTE — Telephone Encounter (Signed)
Medication refills sent to pharmacy and pt is aware

## 2020-07-06 NOTE — Telephone Encounter (Signed)
Please advise. Thank you

## 2020-07-06 NOTE — Telephone Encounter (Signed)
Patient will run out of medication on the 9-15.  Not able to get in with [place] until 08/02/20 so will need a refill sent in for Lexapro and [place] out pt pharm

## 2020-07-06 NOTE — Telephone Encounter (Signed)
30-day with 1 refill of each

## 2020-07-29 ENCOUNTER — Ambulatory Visit: Payer: 59 | Admitting: Family Medicine

## 2020-08-04 ENCOUNTER — Other Ambulatory Visit: Payer: Self-pay

## 2020-08-04 ENCOUNTER — Ambulatory Visit: Payer: 59 | Admitting: Family Medicine

## 2020-08-04 ENCOUNTER — Encounter: Payer: Self-pay | Admitting: Family Medicine

## 2020-08-04 ENCOUNTER — Other Ambulatory Visit: Payer: Self-pay | Admitting: Family Medicine

## 2020-08-04 VITALS — BP 130/78 | HR 82 | Temp 97.4°F | Wt 195.4 lb

## 2020-08-04 DIAGNOSIS — H9202 Otalgia, left ear: Secondary | ICD-10-CM | POA: Diagnosis not present

## 2020-08-04 DIAGNOSIS — J029 Acute pharyngitis, unspecified: Secondary | ICD-10-CM | POA: Diagnosis not present

## 2020-08-04 DIAGNOSIS — I10 Essential (primary) hypertension: Secondary | ICD-10-CM

## 2020-08-04 MED ORDER — ENALAPRIL MALEATE 20 MG PO TABS: 20.0000 mg | ORAL_TABLET | Freq: Two times a day (BID) | ORAL | 1 refills | Status: DC

## 2020-08-04 NOTE — Progress Notes (Signed)
Patient ID: Jennifer Coleman, female    DOB: 1951/04/03, 69 y.o.   MRN: [id number]   Chief Complaint  Patient presents with  . Hypertension    Pt would like to have her ears checked.    Subjective:    HPI  Having some ear pain and throat. Feeling on right side is red and yesterday ulcer on side of left tongue. Gargled and improved. Fatigued 2 days ago. Watering eyes Headache. Left ear pain. 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 Some people been around unmasked. Normally in public places masked. Taking sudafed pe for headaches flonase occ. 6 days of symptoms.  [facility]- [facility]- for enalapril. Was exposed to someone with covid 5-6 wks ago. Has covid testing at that time and eas negative covid.  Has had both covid vaccines. Works with . 06/23/20- negative covid swab.  Medical History [place] has a past medical history of Anxiety, Arthritis, Basal cell carcinoma (02/02/2016), Dysrhythmia, Family history of anesthesia complication (25 yrs ago), Hypertension, Pneumonia, PONV (postoperative nausea and vomiting), Premature ventricular contractions, Shingles, Squamous cell carcinoma of skin (11/14/2011), Squamous cell carcinoma of skin (11/14/2011), Squamous cell carcinoma of skin (11/14/2011), Squamous cell carcinoma of skin (07/26/2015), Squamous cell carcinoma of skin (02/02/2016), Squamous cell carcinoma of skin (07/17/2016), Squamous cell carcinoma of skin (07/17/2016), Squamous cell carcinoma of skin (03/13/2019), Squamous cell carcinoma of skin (03/13/2019), Squamous cell carcinoma of skin (09/23/2019), Squamous cell carcinoma of skin (09/23/2019), Squamous cell carcinoma of skin (09/23/2019), Varicose veins, and Varicose veins of left lower extremity.   Outpatient Encounter Medications as of 08/04/2020  Medication Sig  . alendronate (FOSAMAX) 70 MG tablet Take 1 tablet (70 mg total) by mouth every 7 (seven) days. Take with a full glass of water on an empty stomach.  (Patient not taking: Reported on 08/04/2020)  . cetirizine (ZYRTEC) 10 MG tablet Take 10 mg by mouth daily.   . diclofenac sodium (VOLTAREN) 1 % GEL APPLY TWO GRAMS TO SHOULDER UP TO FOUR TIMES DAILY (Patient taking differently: Apply 2 g topically daily as needed (pain). Knee)  . enalapril (VASOTEC) 20 MG tablet Take 1 tablet (20 mg total) by mouth 2 (two) times daily.  Marland Kitchen escitalopram (LEXAPRO) 20 MG tablet Take 1 tablet (20 mg total) by mouth daily.  . fluticasone (FLONASE) 50 MCG/ACT nasal spray PLACE 2 SPRAYS INTO BOTH NOSTRILS DAILY. (Patient taking differently: Place 2 sprays into both nostrils daily as needed for allergies. )  . hydrochlorothiazide (HYDRODIURIL) 25 MG tablet Take 25 mg by mouth as needed (Fluid).   . Multiple Vitamins-Minerals (ONE-A-DAY WOMENS 50 PLUS PO) Take 1 tablet by mouth daily.  . ondansetron (ZOFRAN-ODT) 4 MG disintegrating tablet Take 1 tablet (4 mg total) by mouth every 6 (six) hours as needed for nausea or vomiting.  Marland Kitchen PROAIR HFA 108 (90 Base) MCG/ACT inhaler INHALE TWO PUFFS BY MOUTH EVERY 4 TO 6 HOURS AS NEEDED FOR FOR WHEEZING (Patient taking differently: Inhale 1-2 puffs into the lungs every 4 (four) hours as needed for wheezing or shortness of breath. )  . [DISCONTINUED] b complex vitamins tablet Take 1 tablet by mouth daily.  . [DISCONTINUED] Calcium Carbonate-Vit D-Min (GNP CALCIUM PLUS 600 +D PO) Take 1 tablet by mouth 2 (two) times daily.   . [DISCONTINUED] docusate sodium (COLACE) 100 MG capsule Take 1 capsule (100 mg total) by mouth 2 (two) times daily. (Patient taking differently: Take 100 mg by mouth daily as needed for mild constipation. )  . [DISCONTINUED] enalapril (  VASOTEC) 20 MG tablet Take 1 tablet (20 mg total) by mouth 2 (two) times daily.   No facility-administered encounter medications on file as of 08/04/2020.     Review of Systems  Constitutional: Positive for fatigue. Negative for chills and fever.  HENT: Positive for congestion, ear  pain and sore throat. Negative for rhinorrhea, sinus pressure and sinus pain.   Eyes: Positive for discharge. Negative for pain and itching.  Respiratory: Negative for cough.   Gastrointestinal: Negative for constipation, diarrhea, nausea and vomiting.  Neurological: Positive for headaches.     Vitals BP 130/78   Pulse 82   Temp (!) 97.4 F (36.3 C)   Wt 195 lb 6.4 oz (88.6 kg)   SpO2 98%   BMI 32.52 kg/m   Objective:   Physical Exam Vitals and nursing note reviewed.  Constitutional:      General: She is not in acute distress.    Appearance: Normal appearance. She is not ill-appearing.  HENT:     Head: Normocephalic and atraumatic.     Right Ear: Tympanic membrane, ear canal and external ear normal.     Left Ear: Ear canal and external ear normal.     Ears:     Comments: +left tm with serous effusion.    Nose: Nose normal. No congestion or rhinorrhea.     Mouth/Throat:     Mouth: Mucous membranes are moist.     Pharynx: Oropharynx is clear. No oropharyngeal exudate or posterior oropharyngeal erythema.  Eyes:     Extraocular Movements: Extraocular movements intact.     Conjunctiva/sclera: Conjunctivae normal.     Pupils: Pupils are equal, round, and reactive to light.  Cardiovascular:     Rate and Rhythm: Normal rate and regular rhythm.     Pulses: Normal pulses.     Heart sounds: Normal heart sounds.  Pulmonary:     Effort: Pulmonary effort is normal.     Breath sounds: Normal breath sounds. No wheezing, rhonchi or rales.  Musculoskeletal:        General: Normal range of motion.     Right lower leg: No edema.     Left lower leg: No edema.  Skin:    General: Skin is warm and dry.     Findings: No lesion or rash.  Neurological:     General: No focal deficit present.     Mental Status: She is alert and oriented to person, place, and time.     Cranial Nerves: No cranial nerve deficit.  Psychiatric:        Mood and Affect: Mood normal.        Behavior: Behavior  normal.      Assessment and Plan   1. Sore throat - Novel Coronavirus, NAA ([facility]) - SARS-COV-2, NAA 2 DAY TAT - Specimen status report  2. Ear pain, left  3. Essential hypertension - enalapril (VASOTEC) 20 MG tablet; Take 1 tablet (20 mg total) by mouth 2 (two) times daily.  Dispense: 180 tablet; Refill: 1   htn-stable. Cont meds. Need labs on next visit  Serous effusion- left- recommending flonase, sudafed prn. Sore throat, ear pain, and congestion.- likely viral vs. Allergies.   covid testing done. Flonase, sudafed and nasal saline prn. Will call with covid results.  F/u 75mo or htn.

## 2020-08-06 LAB — NOVEL CORONAVIRUS, NAA: SARS-CoV-2, NAA: NOT DETECTED

## 2020-08-06 LAB — SPECIMEN STATUS REPORT

## 2020-08-06 LAB — SARS-COV-2, NAA 2 DAY TAT

## 2020-08-12 MED FILL — ENALAPRIL MALEATE 20 MG TAB: 20 | 90 days supply | Qty: 180 | Fill #0

## 2020-08-12 MED FILL — ESCITALOPRAM 20 MG TABLET: 20 | 30 days supply | Qty: 30 | Fill #0

## 2020-08-18 ENCOUNTER — Ambulatory Visit: Payer: 59 | Admitting: Dermatology

## 2020-08-22 ENCOUNTER — Other Ambulatory Visit: Payer: Self-pay

## 2020-08-22 ENCOUNTER — Ambulatory Visit (INDEPENDENT_AMBULATORY_CARE_PROVIDER_SITE_OTHER): Payer: 59 | Admitting: Dermatology

## 2020-08-22 DIAGNOSIS — Z85828 Personal history of other malignant neoplasm of skin: Secondary | ICD-10-CM | POA: Diagnosis not present

## 2020-08-22 DIAGNOSIS — D485 Neoplasm of uncertain behavior of skin: Secondary | ICD-10-CM | POA: Diagnosis not present

## 2020-08-22 DIAGNOSIS — Z86007 Personal history of in-situ neoplasm of skin: Secondary | ICD-10-CM

## 2020-08-22 DIAGNOSIS — Z1283 Encounter for screening for malignant neoplasm of skin: Secondary | ICD-10-CM

## 2020-08-22 DIAGNOSIS — Z8589 Personal history of malignant neoplasm of other organs and systems: Secondary | ICD-10-CM

## 2020-08-22 DIAGNOSIS — L820 Inflamed seborrheic keratosis: Secondary | ICD-10-CM | POA: Diagnosis not present

## 2020-08-22 DIAGNOSIS — D0471 Carcinoma in situ of skin of right lower limb, including hip: Secondary | ICD-10-CM

## 2020-08-22 DIAGNOSIS — L570 Actinic keratosis: Secondary | ICD-10-CM | POA: Diagnosis not present

## 2020-08-22 NOTE — Patient Instructions (Signed)

## 2020-08-24 ENCOUNTER — Encounter: Payer: Self-pay | Admitting: Dermatology

## 2020-08-24 NOTE — Progress Notes (Signed)
   Follow-Up Visit   Subjective  Jennifer Coleman is a 69 y.o. female who presents for the following: Skin Problem (left arm, legs and top of right foot. Has a spot near bra line that she wants frozen or remvoved very irritating.).  New crusts Location:  Duration:  Quality:  Associated Signs/Symptoms: Modifying Factors:  Severity:  Timing: Context: History of multiple skin cancers.  Objective  Well appearing patient in no apparent distress; mood and affect are within normal limits.  A full examination was performed including scalp, head, eyes, ears, nose, lips, neck, chest, axillae, abdomen, back, buttocks, bilateral upper extremities, bilateral lower extremities, hands, feet, fingers, toes, fingernails, and toenails. All findings within normal limits unless otherwise noted below.   Assessment & Plan    Screening for malignant neoplasm of skin Mid Back  Yearly skin check  History of squamous cell carcinoma in situ (SCCIS) (3) Left Upper Back; Left Buccal Cheek ; Right Buccal Cheek   Clear; annual skin examination  History of squamous cell carcinoma (2) Left Shoulder - Posterior; Right Lower Leg - Anterior  No treatment needed. clear.  No sign recurrence.  Neoplasm of uncertain behavior of skin (2) Right Lower Leg - Posterior  Skin / nail biopsy Type of biopsy: tangential   Informed consent: discussed and consent obtained   Timeout: patient name, date of birth, surgical site, and procedure verified   Procedure prep:  Patient was prepped and draped in usual sterile fashion (Non sterile) Prep type:  Chlorhexidine Anesthesia: the lesion was anesthetized in a standard fashion   Anesthetic:  1% lidocaine w/ epinephrine 1-100,000 local infiltration Instrument used: flexible razor blade   Outcome: patient tolerated procedure well   Post-procedure details: wound care instructions given    Specimen 1 - Surgical pathology Differential Diagnosis: scc vs bcc Check Margins:  No  Right Forearm - Posterior  Skin / nail biopsy Type of biopsy: tangential   Informed consent: discussed and consent obtained   Timeout: patient name, date of birth, surgical site, and procedure verified   Procedure prep:  Patient was prepped and draped in usual sterile fashion (Non sterile) Prep type:  Chlorhexidine Anesthesia: the lesion was anesthetized in a standard fashion   Anesthetic:  1% lidocaine w/ epinephrine 1-100,000 local infiltration Instrument used: flexible razor blade   Outcome: patient tolerated procedure well   Post-procedure details: wound care instructions given    Specimen 2 - Surgical pathology Differential Diagnosis: scc vs bcc Check Margins: No  AK (actinic keratosis) (9) Left Upper Arm - Posterior; Left Lower Leg - Anterior (4); Mid Back; Left Dorsal Hand; Right Dorsum of Foot; Mid Upper Vermilion Lip  Destruction of lesion - Left Dorsal Hand, Left Lower Leg - Anterior, Left Upper Arm - Posterior, Mid Back, Mid Upper Vermilion Lip, Right Dorsum of Foot Complexity: simple   Destruction method: cryotherapy   Informed consent: discussed and consent obtained   Timeout:  patient name, date of birth, surgical site, and procedure verified Lesion destroyed using liquid nitrogen: Yes   Cryotherapy cycles:  5 Outcome: patient tolerated procedure well with no complications   Post-procedure details: wound care instructions given       I, Lavonna Monarch, MD, have reviewed all documentation for this visit.  The documentation on 08/24/20 for the exam, diagnosis, procedures, and orders are all accurate and complete.

## 2020-08-29 ENCOUNTER — Telehealth: Payer: Self-pay

## 2020-08-29 NOTE — Telephone Encounter (Signed)
-----   Message from Lavonna Monarch, MD sent at 08/27/2020  4:10 AM EDT ----- Schedule surgery with Dr. Darene Lamer

## 2020-08-29 NOTE — Telephone Encounter (Signed)
PATH TO PATIENT SURG MADE.

## 2020-08-29 NOTE — Telephone Encounter (Signed)
Left message to call surg. made next available with DR Denna Haggard

## 2020-09-12 MED FILL — ESCITALOPRAM 20 MG TABLET: 20 | 30 days supply | Qty: 30 | Fill #1

## 2020-09-12 MED FILL — ALENDRONATE NA 70 MG TAB: 70 | 84 days supply | Qty: 12 | Fill #3

## 2020-10-13 MED FILL — ESCITALOPRAM 20 MG TABLET: 20 | 30 days supply | Qty: 30 | Fill #1

## 2020-10-26 ENCOUNTER — Encounter: Payer: 59 | Admitting: Dermatology

## 2020-10-27 ENCOUNTER — Ambulatory Visit: Payer: 59 | Attending: Internal Medicine

## 2020-10-27 DIAGNOSIS — Z23 Encounter for immunization: Secondary | ICD-10-CM

## 2020-10-27 NOTE — Progress Notes (Addendum)
   Covid-19 Vaccination Clinic  Name:  Jennifer Coleman    MRN: [id number] DOB: 10/24/1951  10/27/2020  Jennifer Coleman was observed post Covid-19 immunization for 30 minutes without incident. She was provided with Vaccine Information Sheet and instruction to access the V-Safe system.   Jennifer Coleman was instructed to call 911 with any severe reactions post vaccine: Marland Kitchen Difficulty breathing  . Swelling of face and throat  . A fast heartbeat  . A bad rash all over body  . Dizziness and weakness   Immunizations Administered    Name Date Dose VIS Date Route   Pfizer COVID-19 Vaccine 10/27/2020  1:22 PM 0.3 mL 08/17/2020 Intramuscular   Manufacturer: [employer], [employer]   Lot: [id number]   NDC: [address]-3

## 2020-11-11 ENCOUNTER — Telehealth (INDEPENDENT_AMBULATORY_CARE_PROVIDER_SITE_OTHER): Payer: 59 | Admitting: Family Medicine

## 2020-11-11 ENCOUNTER — Encounter: Payer: Self-pay | Admitting: Family Medicine

## 2020-11-11 ENCOUNTER — Other Ambulatory Visit: Payer: Self-pay

## 2020-11-11 DIAGNOSIS — J40 Bronchitis, not specified as acute or chronic: Secondary | ICD-10-CM

## 2020-11-11 DIAGNOSIS — R059 Cough, unspecified: Secondary | ICD-10-CM | POA: Insufficient documentation

## 2020-11-11 DIAGNOSIS — U071 COVID-19: Secondary | ICD-10-CM | POA: Diagnosis not present

## 2020-11-11 DIAGNOSIS — R110 Nausea: Secondary | ICD-10-CM | POA: Diagnosis not present

## 2020-11-11 MED ORDER — BENZONATATE 100 MG PO CAPS: 100.0000 mg | ORAL_CAPSULE | Freq: Two times a day (BID) | ORAL | 0 refills | Status: DC | PRN

## 2020-11-11 MED ORDER — PREDNISONE 20 MG PO TABS: 20.0000 mg | ORAL_TABLET | Freq: Every day | ORAL | 0 refills | Status: DC

## 2020-11-11 MED ORDER — PROAIR HFA 108 (90 BASE) MCG/ACT IN AERS: INHALATION_SPRAY | RESPIRATORY_TRACT | 0 refills | Status: AC

## 2020-11-11 NOTE — Progress Notes (Signed)
Patient ID: Jennifer Coleman, female    DOB: [birth year], 70 y.o.   MRN: [id number]   Chief Complaint  Patient presents with  . Covid Positive    Patient tested positive for covid yesterday. Patient experiencing sore throat, headaches, irritated eyes, chills and coughing with some wheezing.  Using humidifier, inhaler and ibuprofen/tylenol.    Subjective:  CC: COVID positive  This is a new problem.  Presents today via telephone visit with a complaint of being COVID-positive, sore throat, headache, irritated eyes, chills, and cough.  Denies fever, shortness of breath, reports wheezing but only with coughing.  Does not have a pulse ox meter, her sister has 1 and she will borrow it.  She is fully vaccinated and booster.  Reports adequate hydration, has tried humidifier, her inhaler, Tylenol, and ibuprofen.  Needs a refill on her albuterol inhaler today.  Reports that she is occasionally having a wave of nausea, nothing too bad yet.  Has a history of bronchitis, in which she gets prednisone when this is exacerbated.  Virtual Visit via Telephone Note  I connected with MADDY GRAHAM on 11/11/20 at  9:30 AM EST by telephone and verified that I am speaking with the correct person using two identifiers.  Location: Patient: home Provider: office   I discussed the limitations, risks, security and privacy concerns of performing an evaluation and management service by telephone and the availability of in person appointments. I also discussed with the patient that there may be a patient responsible charge related to this service. The patient expressed understanding and agreed to proceed.   History of Present Illness:    Observations/Objective:   Assessment and Plan:   Follow Up Instructions:    I discussed the assessment and treatment plan with the patient. The patient was provided an opportunity to ask questions and all were answered. The patient agreed with the plan and demonstrated an  understanding of the instructions.   The patient was advised to call back or seek an in-person evaluation if the symptoms worsen or if the condition fails to improve as anticipated.  I provided 18 minutes of non-face-to-face time during this encounter.   Medical History Francessca has a past medical history of Anxiety, Arthritis, Basal cell carcinoma (02/02/2016), Dysrhythmia, Family history of anesthesia complication (25 yrs ago), Hypertension, Pneumonia, PONV (postoperative nausea and vomiting), Premature ventricular contractions, Shingles, Squamous cell carcinoma of skin (11/14/2011), Squamous cell carcinoma of skin (11/14/2011), Squamous cell carcinoma of skin (11/14/2011), Squamous cell carcinoma of skin (07/26/2015), Squamous cell carcinoma of skin (02/02/2016), Squamous cell carcinoma of skin (07/17/2016), Squamous cell carcinoma of skin (07/17/2016), Squamous cell carcinoma of skin (03/13/2019), Squamous cell carcinoma of skin (03/13/2019), Squamous cell carcinoma of skin (09/23/2019), Squamous cell carcinoma of skin (09/23/2019), Squamous cell carcinoma of skin (09/23/2019), Varicose veins, and Varicose veins of left lower extremity.   Outpatient Encounter Medications as of 11/11/2020  Medication Sig  . benzonatate (TESSALON) 100 MG capsule Take 1 capsule (100 mg total) by mouth 2 (two) times daily as needed for cough.  . predniSONE (DELTASONE) 20 MG tablet Take 1 tablet (20 mg total) by mouth daily with breakfast.  . alendronate (FOSAMAX) 70 MG tablet Take 1 tablet (70 mg total) by mouth every 7 (seven) days. Take with a full glass of water on an empty stomach.  . cetirizine (ZYRTEC) 10 MG tablet Take 10 mg by mouth daily.   . diclofenac sodium (VOLTAREN) 1 % GEL APPLY TWO GRAMS TO SHOULDER UP TO  FOUR TIMES DAILY (Patient taking differently: Apply 2 g topically daily as needed (pain). Knee)  . enalapril (VASOTEC) 20 MG tablet Take 1 tablet (20 mg total) by mouth 2 (two) times daily.  Marland Kitchen  escitalopram (LEXAPRO) 20 MG tablet Take 1 tablet (20 mg total) by mouth daily.  . fluticasone (FLONASE) 50 MCG/ACT nasal spray PLACE 2 SPRAYS INTO BOTH NOSTRILS DAILY. (Patient taking differently: Place 2 sprays into both nostrils daily as needed for allergies. )  . hydrochlorothiazide (HYDRODIURIL) 25 MG tablet Take 25 mg by mouth as needed (Fluid).   . Multiple Vitamins-Minerals (ONE-A-DAY WOMENS 50 PLUS PO) Take 1 tablet by mouth daily.  Marland Kitchen PROAIR HFA 108 (90 Base) MCG/ACT inhaler INHALE TWO PUFFS BY MOUTH EVERY 4 TO 6 HOURS AS NEEDED FOR FOR WHEEZING  . [DISCONTINUED] ondansetron (ZOFRAN-ODT) 4 MG disintegrating tablet Take 1 tablet (4 mg total) by mouth every 6 (six) hours as needed for nausea or vomiting.  . [DISCONTINUED] PROAIR HFA 108 (90 Base) MCG/ACT inhaler INHALE TWO PUFFS BY MOUTH EVERY 4 TO 6 HOURS AS NEEDED FOR FOR WHEEZING (Patient taking differently: Inhale 1-2 puffs into the lungs every 4 (four) hours as needed for wheezing or shortness of breath. )   No facility-administered encounter medications on file as of 11/11/2020.     Review of Systems  Constitutional: Positive for chills and fatigue. Negative for fever.  HENT: Positive for congestion, ear pain and sore throat.   Eyes: Positive for itching. Negative for photophobia.       Irritated and dry feeling.  Respiratory: Positive for cough and wheezing. Negative for shortness of breath.   Cardiovascular: Negative for chest pain.  Gastrointestinal: Positive for nausea. Negative for abdominal pain, diarrhea and vomiting.  Musculoskeletal: Positive for myalgias.  Neurological: Positive for headaches. Negative for dizziness and light-headedness.     Vitals There were no vitals taken for this visit.  Objective:   Physical Exam   Assessment and Plan   1. COVID-19 virus infection - PROAIR HFA 108 (90 Base) MCG/ACT inhaler; INHALE TWO PUFFS BY MOUTH EVERY 4 TO 6 HOURS AS NEEDED FOR FOR WHEEZING  Dispense: 1 each; Refill:  0 - benzonatate (TESSALON) 100 MG capsule; Take 1 capsule (100 mg total) by mouth 2 (two) times daily as needed for cough.  Dispense: 20 capsule; Refill: 0 - predniSONE (DELTASONE) 20 MG tablet; Take 1 tablet (20 mg total) by mouth daily with breakfast.  Dispense: 7 tablet; Refill: 0 - Ambulatory referral for Covid Treatment  2. Nausea - Ambulatory referral for Covid Treatment  3. Bronchitis - predniSONE (DELTASONE) 20 MG tablet; Take 1 tablet (20 mg total) by mouth daily with breakfast.  Dispense: 7 tablet; Refill: 0 - Ambulatory referral for Covid Treatment  4. Cough - benzonatate (TESSALON) 100 MG capsule; Take 1 capsule (100 mg total) by mouth 2 (two) times daily as needed for cough.  Dispense: 20 capsule; Refill: 0 - Ambulatory referral for Covid Treatment   Will borrow pulse ox meter from her sister, instructed that if her oxygen saturation is 93% or less she should seek emergency care.  She is staying adequately hydrated, has albuterol inhaler which needs to be refilled, will also prescribe prednisone so she can have it available if her bronchitis is exacerbated.  She does not need that today.  Tessalon Perles sent for cough.  Agrees with plan of care discussed today. Understands warning signs to seek further care: Chest pain, shortness of breath, any significant change in health.  COVID warnings given as stated below.   Understands to follow-up if symptoms worsen, do not improve, report to the emergency department in any respiratory concerns.  Referral made to COVID infusion clinic.  She understands demand is high, supply is low.   Covid-19 warning:  Covid-19 is a virus that causes hypoxia (low oxygen level in blood) in some people. If you develop any changes in your usual breathing pattern: difficulty catching your breath, more short winded with activity or with resting, or anything that concerns you about your breathing, do not hesitate to go to the emergency department immediately  for evaluation. Covid infection can also affect the way the brain functions if it lacks oxygen, such as, feeling dizzy, passing out, or feeling confused, if you experience any of these symptoms, please do not delay to seek treatment.  Some people experience gastrointestinal problems with Covid, such as vomiting and diarrhea, dehydration is a serious risk and should be avoided. If you are unable to keep liquids down you may need to go to the emergency department for intravenous fluids to avoid dehydration.   Please alert and involve your family and/or friends to help keep an eye on you while you recover from Covid-19. If you have any questions or concerns about your recovery, please do not hesitate to call the office for guidance.    Recommend supportive therapy while you are recovering:   1) Get lots of rest.  2) Take over the counter pain medication if needed, such as acetaminophen or ibuprofen. Read and follow instructions on the label and make sure not to combine other medications that may have same ingredients in it. It is important to not take too much of these ingredients.  3) Drink plenty of caffeine-free fluids. (If you have heart or kidney problems, follow the instructions of your specialist regarding amounts).  4) If you are hungry, eat a bland diet, such as the BRAT diet (bananas, rice, applesauce, toast).  5) Let us know if you are not feeling better in a week.   Covid-19 Quarantine Instructions:   You have tested positive for Covid-19 infection. The current CDC guidelines for quarantine regardless of vaccination status are:    Please quarantine and isolate at home for 5 days.  - If you have no symptoms or your symptoms are resolving after 5 days you   can leave the home (resolving means no shortness of breath, no fever, no headache, etc). -Continue to wear a mask around others for an additional 5 days.  -If you have a fever or other symptoms continue to stay at home until    resolved.  Use over-the-counter medications for symptoms.If you develop respiratory issues/distress (see Covid warning), seek medical care in the Emergency Department.  If you must leave home or if you have to be around others please wear a mask. Please limit contact with immediate family members in the home, practice social distancing, frequent handwashing and clean hard surfaces touched frequently with household cleaning products. Members of your household will also need to quarantine for 5 days and test on day five if possible.You may also be contacted by the health department for follow up.   Pecolia Ades, FNP-C

## 2020-11-12 ENCOUNTER — Telehealth: Payer: Self-pay | Admitting: Infectious Diseases

## 2020-11-12 NOTE — Telephone Encounter (Signed)
Called to discuss with patient about COVID-19 symptoms and the use of one of the available treatments for those with mild to moderate Covid symptoms and at a high risk of hospitalization.  Pt appears to qualify for outpatient treatment due to co-morbid conditions and/or a member of an at-risk group in accordance with the FDA Emergency Use Authorization.    Symptom onset: not clear - positive test 1/13 Vaccinated: yes Booster? yes Immunocompromised? no Qualifiers: age > 28,   Unable to reach pt - LVM and sent mychart to her  Based on vaccinated / boosted status she is a candidate for antiviral therapy. Would offer molnupiravir if we an verify symptoms </= 5d today. Could consider IV remdesivir through day 7 however with snow coming treatment appointments may be interrupted.    Jennifer Madeira, NP

## 2020-11-14 ENCOUNTER — Other Ambulatory Visit: Payer: Self-pay | Admitting: Family Medicine

## 2020-11-14 MED FILL — ENALAPRIL MALEATE 20 MG TAB: 20 | 90 days supply | Qty: 180 | Fill #1

## 2020-11-15 ENCOUNTER — Other Ambulatory Visit: Payer: Self-pay | Admitting: Family Medicine

## 2020-11-15 NOTE — Telephone Encounter (Signed)
Pls call pt to see how she is feeling and if still having symptoms, let her know she qualifies for the monoclonal ab infusion.   Thx. DR. Lovena Le

## 2020-11-15 NOTE — Telephone Encounter (Signed)
Needs f/u appt on depression/anxiety. In the next 3-4 wks.   Dr. Lovena Le

## 2020-11-16 MED FILL — ESCITALOPRAM 20 MG TABLET: 20 | 30 days supply | Qty: 30 | Fill #0

## 2020-11-16 NOTE — Telephone Encounter (Signed)
Patient has appointment 1/20

## 2020-11-16 NOTE — Telephone Encounter (Signed)
Please contact pt to set up appt. Thank you

## 2020-11-16 NOTE — Telephone Encounter (Signed)
Pt contacted. Pt states she is doing good. Still having the headache but doing good otherwise. Pt would like to cancel her physical for tomorrow. Told pt that would be fine. Pt will call back later today to cancel that appt and set up new appt.

## 2020-11-16 NOTE — Telephone Encounter (Signed)
Ok thx. Dr. Aamori Mcmasters  

## 2020-11-17 ENCOUNTER — Ambulatory Visit: Payer: 59 | Admitting: Family Medicine

## 2020-12-05 ENCOUNTER — Other Ambulatory Visit: Payer: Self-pay | Admitting: Nurse Practitioner

## 2020-12-06 ENCOUNTER — Other Ambulatory Visit: Payer: Self-pay | Admitting: Nurse Practitioner

## 2020-12-06 MED FILL — ALENDRONATE NA 70 MG TAB: 70 | 84 days supply | Qty: 12 | Fill #0

## 2020-12-13 ENCOUNTER — Encounter: Payer: 59 | Admitting: Family Medicine

## 2020-12-14 ENCOUNTER — Encounter: Payer: 59 | Admitting: Family Medicine

## 2020-12-15 ENCOUNTER — Encounter: Payer: Self-pay | Admitting: Family Medicine

## 2021-01-17 ENCOUNTER — Other Ambulatory Visit (HOSPITAL_BASED_OUTPATIENT_CLINIC_OR_DEPARTMENT_OTHER): Payer: Self-pay

## 2021-01-31 ENCOUNTER — Other Ambulatory Visit (HOSPITAL_COMMUNITY): Payer: Self-pay

## 2021-02-24 ENCOUNTER — Other Ambulatory Visit: Payer: Self-pay | Admitting: Family Medicine

## 2021-02-24 ENCOUNTER — Other Ambulatory Visit (HOSPITAL_COMMUNITY): Payer: Self-pay

## 2021-02-24 DIAGNOSIS — I10 Essential (primary) hypertension: Secondary | ICD-10-CM

## 2021-02-24 MED FILL — Alendronate Sodium Tab 70 MG: ORAL | 84 days supply | Qty: 12 | Fill #0 | Status: AC

## 2021-02-25 ENCOUNTER — Other Ambulatory Visit (HOSPITAL_COMMUNITY): Payer: Self-pay

## 2021-02-27 ENCOUNTER — Telehealth: Payer: Self-pay

## 2021-02-27 ENCOUNTER — Other Ambulatory Visit (HOSPITAL_COMMUNITY): Payer: Self-pay

## 2021-02-27 MED ORDER — ESCITALOPRAM OXALATE 20 MG PO TABS
ORAL_TABLET | Freq: Every day | ORAL | 0 refills | Status: DC
  Filled 2021-02-27: qty 30, 30d supply, fill #0

## 2021-02-27 MED ORDER — ENALAPRIL MALEATE 20 MG PO TABS
ORAL_TABLET | Freq: Two times a day (BID) | ORAL | 0 refills | Status: DC
  Filled 2021-02-27: qty 60, 30d supply, fill #0

## 2021-02-27 NOTE — Telephone Encounter (Signed)
Refill request for Lexapro and enalapril. Please advise. Thank you

## 2021-02-27 NOTE — Telephone Encounter (Signed)
Sent mychart message to schedule.

## 2021-02-27 NOTE — Telephone Encounter (Signed)
No results found for: HGBA1C  Lab Results  Component Value Date   CREATININE 0.90 11/13/2019     Lab Results  Component Value Date   CHOL 229 (H) 11/13/2019   HDL 97 11/13/2019   LDLCALC 118 (H) 11/13/2019   TRIG 84 11/13/2019   CHOLHDL 2.4 11/13/2019     BP Readings from Last 3 Encounters:  08/04/20 130/78  11/13/19 122/78  08/18/19 (!) 167/82

## 2021-02-27 NOTE — Telephone Encounter (Signed)
Pt said to wait to Wednesday she retired from [employer] and the insurance run out Saturday through [employer] and now she will be on [place] for 90 day supply which will be cheaper that way

## 2021-02-27 NOTE — Telephone Encounter (Signed)
Med Check appt made 05/04

## 2021-02-27 NOTE — Telephone Encounter (Signed)
Made appointment for 5/4 for medication followup

## 2021-02-27 NOTE — Telephone Encounter (Signed)
Med Check appt was made on 05/04 Pt is out of BP meds

## 2021-02-28 ENCOUNTER — Other Ambulatory Visit (HOSPITAL_COMMUNITY): Payer: Self-pay

## 2021-03-01 ENCOUNTER — Encounter: Payer: Self-pay | Admitting: Family Medicine

## 2021-03-01 ENCOUNTER — Other Ambulatory Visit: Payer: Self-pay

## 2021-03-01 ENCOUNTER — Ambulatory Visit (INDEPENDENT_AMBULATORY_CARE_PROVIDER_SITE_OTHER): Payer: PPO | Admitting: Family Medicine

## 2021-03-01 VITALS — BP 132/78 | HR 82 | Temp 93.7°F | Ht 65.0 in | Wt 200.4 lb

## 2021-03-01 DIAGNOSIS — F418 Other specified anxiety disorders: Secondary | ICD-10-CM | POA: Diagnosis not present

## 2021-03-01 DIAGNOSIS — Z0000 Encounter for general adult medical examination without abnormal findings: Secondary | ICD-10-CM

## 2021-03-01 DIAGNOSIS — I10 Essential (primary) hypertension: Secondary | ICD-10-CM | POA: Diagnosis not present

## 2021-03-01 DIAGNOSIS — M810 Age-related osteoporosis without current pathological fracture: Secondary | ICD-10-CM | POA: Diagnosis not present

## 2021-03-01 DIAGNOSIS — R600 Localized edema: Secondary | ICD-10-CM | POA: Diagnosis not present

## 2021-03-01 MED ORDER — ENALAPRIL MALEATE 20 MG PO TABS: ORAL_TABLET | Freq: Two times a day (BID) | ORAL | 1 refills | Status: DC

## 2021-03-01 MED ORDER — ESCITALOPRAM OXALATE 20 MG PO TABS: ORAL_TABLET | Freq: Every day | ORAL | 1 refills | Status: DC

## 2021-03-01 NOTE — Telephone Encounter (Signed)
Called pt and she states she is coming in today and will let nurse and dr taylor know which meds she needs sent in to [place]

## 2021-03-01 NOTE — Progress Notes (Signed)
Patient ID: Jennifer Coleman, female    DOB: 1951/07/30, 70 y.o.   MRN: [id number]   Chief Complaint  Patient presents with  . Hypertension  . Osteoporosis  . Anxiety   Subjective:    HPI  Pt here for medication follow up and follow up on BP.  Pt states she is doing well; getting use to retirement.   Doing well with bp.   In 1/22- had covid and got better.  Had a headache daily during that time. Needed prednisone and tessalon. Didn't take the paxlovid. Feeling she got better.  Elevated calcium last visit.  Was told to go from 3 calcium chews to 2 per day. Was taking calcium supplements due to having gastric sleeve 2020.  Chewables 3x per day for calcium. And pt did cut back on that.  Recently retired and is out and about and feeling good about retirement. 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   Was on lexapro due to taking care of 2 grandchildren and anxious all time.  Started on lexapro.  Some days $RemoveB'10mg'giEJDfJQ$  lexapro. Mostly taking the $RemoveBe'20mg'zXrANoEGm$ .  Leg swelling-intermittent, taking hctz- $RemoveBefo'25mg'YxgTPDpmLIk$  prn as needed for lower leg swelling.  Pt getting leg cramps when taking it.  Only needing if a few times per year.  Not on it daily.    Medical History Jennifer Coleman has a past medical history of Anxiety, Arthritis, Basal cell carcinoma (02/02/2016), Dysrhythmia, Family history of anesthesia complication (25 yrs ago), Hypertension, Pneumonia, PONV (postoperative nausea and vomiting), Premature ventricular contractions, Shingles, Squamous cell carcinoma of skin (11/14/2011), Squamous cell carcinoma of skin (11/14/2011), Squamous cell carcinoma of skin (11/14/2011), Squamous cell carcinoma of skin (07/26/2015), Squamous cell carcinoma of skin (02/02/2016), Squamous cell carcinoma of skin (07/17/2016), Squamous cell carcinoma of skin (07/17/2016), Squamous cell carcinoma of skin (03/13/2019), Squamous cell carcinoma of skin (03/13/2019), Squamous cell carcinoma of skin (09/23/2019),  Squamous cell carcinoma of skin (09/23/2019), Squamous cell carcinoma of skin (09/23/2019), Varicose veins, and Varicose veins of left lower extremity.   Outpatient Encounter Medications as of 03/01/2021  Medication Sig  . alendronate (FOSAMAX) 70 MG tablet TAKE 1 TABLET BY MOUTH EVERY 7 DAYS WITH A FULL GLASS OF WATER ON AN EMPTY STOMACH  . cetirizine (ZYRTEC) 10 MG tablet Take 10 mg by mouth daily.  . diclofenac sodium (VOLTAREN) 1 % GEL APPLY TWO GRAMS TO SHOULDER UP TO FOUR TIMES DAILY (Patient taking differently: Apply 2 g topically daily as needed (pain). Knee)  . fluticasone (FLONASE) 50 MCG/ACT nasal spray PLACE 2 SPRAYS INTO BOTH NOSTRILS DAILY. (Patient taking differently: Place 2 sprays into both nostrils daily as needed for allergies.)  . hydrochlorothiazide (HYDRODIURIL) 25 MG tablet Take 25 mg by mouth as needed (Fluid).   . Multiple Vitamins-Minerals (ONE-A-DAY WOMENS 50 PLUS PO) Take 1 tablet by mouth daily.  Marland Kitchen PROAIR HFA 108 (90 Base) MCG/ACT inhaler INHALE TWO PUFFS BY MOUTH EVERY 4 TO 6 HOURS AS NEEDED FOR FOR WHEEZING  . [DISCONTINUED] enalapril (VASOTEC) 20 MG tablet TAKE 1 TABLET BY MOUTH TWO TIMES DAILY  . [DISCONTINUED] escitalopram (LEXAPRO) 20 MG tablet TAKE 1 TABLET BY MOUTH ONCE A DAY  . enalapril (VASOTEC) 20 MG tablet TAKE 1 TABLET BY MOUTH TWO TIMES DAILY  . escitalopram (LEXAPRO) 20 MG tablet TAKE 1 TABLET BY MOUTH ONCE A DAY  . [DISCONTINUED] benzonatate (TESSALON) 100 MG capsule Take 1 capsule (100 mg total) by mouth 2 (two) times daily as needed  for cough.  . [DISCONTINUED] predniSONE (DELTASONE) 20 MG tablet Take 1 tablet (20 mg total) by mouth daily with breakfast.   No facility-administered encounter medications on file as of 03/01/2021.     Review of Systems  Constitutional: Negative for chills and fever.  HENT: Negative for congestion, rhinorrhea and sore throat.   Respiratory: Negative for cough, shortness of breath and wheezing.   Cardiovascular:  Positive for leg swelling (intermittent). Negative for chest pain.  Gastrointestinal: Negative for abdominal pain, diarrhea, nausea and vomiting.  Genitourinary: Negative for dysuria and frequency.  Musculoskeletal: Negative for arthralgias and back pain.  Skin: Negative for rash.  Neurological: Negative for dizziness, weakness and headaches.     Vitals BP 132/78   Pulse 82   Temp (!) 93.7 F (34.3 C)   Ht 5\' 5"  (1.651 m)   Wt 200 lb 6.4 oz (90.9 kg)   SpO2 96%   BMI 33.35 kg/m   Objective:   Physical Exam Vitals and nursing note reviewed.  Constitutional:      Appearance: Normal appearance.  HENT:     Head: Normocephalic and atraumatic.     Nose: Nose normal.     Mouth/Throat:     Mouth: Mucous membranes are moist.     Pharynx: Oropharynx is clear.  Eyes:     Extraocular Movements: Extraocular movements intact.     Conjunctiva/sclera: Conjunctivae normal.     Pupils: Pupils are equal, round, and reactive to light.  Cardiovascular:     Rate and Rhythm: Normal rate and regular rhythm.     Pulses: Normal pulses.     Heart sounds: Normal heart sounds.  Pulmonary:     Effort: Pulmonary effort is normal.     Breath sounds: Normal breath sounds. No wheezing, rhonchi or rales.  Musculoskeletal:        General: Normal range of motion.     Right lower leg: No edema.     Left lower leg: No edema.  Skin:    General: Skin is warm and dry.     Findings: No lesion or rash.  Neurological:     General: No focal deficit present.     Mental Status: She is alert and oriented to person, place, and time.  Psychiatric:        Mood and Affect: Mood normal.        Behavior: Behavior normal.    Assessment and Plan   1. Essential hypertension - CBC - CMP14+EGFR - Lipid panel - enalapril (VASOTEC) 20 MG tablet; TAKE 1 TABLET BY MOUTH TWO TIMES DAILY  Dispense: 180 tablet; Refill: 1  2. Laboratory tests ordered as part of a complete physical exam (CPE) - CBC - CMP14+EGFR -  Lipid panel  3. Hypercalcemia  4. Age-related osteoporosis without current pathological fracture  5. Depression with anxiety - escitalopram (LEXAPRO) 20 MG tablet; TAKE 1 TABLET BY MOUTH ONCE A DAY  Dispense: 90 tablet; Refill: 1  6. Lower leg edema   htn- suboptimal.  Cont to watch salt in diet and inc in exercising. Cont meds. Recheck on next visit for wellness.   Seeing 03/10/21 for well woman exam. Pt given lab orders for her upcoming wellness exam.  Hypercalcemia- recheck labs. Last time was 10.4 in 2021. Does take calcium supplement for osteoporosis and h/o gastric sleeve.  Osteoporosis- pt to continue with fosamax and calcium supplement. H/o gastric sleeve in 2020, so needing calcium supplement.  anxiety- doing well and wanting to cont meds.  Lower leg edema- stable. Using hctz prn for leg swelling.  Return in about 6 months (around 09/01/2021) for f/u htn, anxiety, osteoporosis.

## 2021-03-08 ENCOUNTER — Encounter (HOSPITAL_COMMUNITY): Payer: Self-pay | Admitting: *Deleted

## 2021-03-09 ENCOUNTER — Ambulatory Visit: Payer: 59 | Admitting: Physician Assistant

## 2021-03-14 DIAGNOSIS — I10 Essential (primary) hypertension: Secondary | ICD-10-CM | POA: Diagnosis not present

## 2021-03-14 DIAGNOSIS — Z0000 Encounter for general adult medical examination without abnormal findings: Secondary | ICD-10-CM | POA: Diagnosis not present

## 2021-03-15 LAB — LIPID PANEL
Chol/HDL Ratio: 2.5 ratio (ref 0.0–4.4)
Cholesterol, Total: 248 mg/dL — ABNORMAL HIGH (ref 100–199)
HDL: 98 mg/dL (ref >39)
LDL Chol Calc (NIH): 138 mg/dL — ABNORMAL HIGH (ref 0–99)
Triglycerides: 71 mg/dL (ref 0–149)
VLDL Cholesterol Cal: 12 mg/dL (ref 5–40)

## 2021-03-15 LAB — CMP14+EGFR
ALT: 13 IU/L (ref 0–32)
AST: 18 IU/L (ref 0–40)
Albumin/Globulin Ratio: 1.6 (ref 1.2–2.2)
Albumin: 4.1 g/dL (ref 3.8–4.8)
Alkaline Phosphatase: 85 IU/L (ref 44–121)
BUN/Creatinine Ratio: 18 (ref 12–28)
BUN: 18 mg/dL (ref 8–27)
Bilirubin Total: 0.4 mg/dL (ref 0.0–1.2)
CO2: 25 mmol/L (ref 20–29)
Calcium: 10.1 mg/dL (ref 8.7–10.3)
Chloride: 105 mmol/L (ref 96–106)
Creatinine, Ser: 1.02 mg/dL — ABNORMAL HIGH (ref 0.57–1.00)
Globulin, Total: 2.6 g/dL (ref 1.5–4.5)
Glucose: 80 mg/dL (ref 65–99)
Potassium: 4.6 mmol/L (ref 3.5–5.2)
Sodium: 144 mmol/L (ref 134–144)
Total Protein: 6.7 g/dL (ref 6.0–8.5)
eGFR: 60 mL/min/{1.73_m2} (ref >59)

## 2021-03-15 LAB — CBC
Hematocrit: 41.6 % (ref 34.0–46.6)
Hemoglobin: 13.7 g/dL (ref 11.1–15.9)
MCH: 31.1 pg (ref 26.6–33.0)
MCHC: 32.9 g/dL (ref 31.5–35.7)
MCV: 94 fL (ref 79–97)
Platelets: 240 10*3/uL (ref 150–450)
RBC: 4.41 x10E6/uL (ref 3.77–5.28)
RDW: 12 % (ref 11.7–15.4)
WBC: 6.6 10*3/uL (ref 3.4–10.8)

## 2021-03-17 ENCOUNTER — Telehealth: Payer: Self-pay

## 2021-03-17 ENCOUNTER — Encounter: Payer: Self-pay | Admitting: Nurse Practitioner

## 2021-03-17 ENCOUNTER — Ambulatory Visit: Payer: PPO | Admitting: Nurse Practitioner

## 2021-03-17 NOTE — Telephone Encounter (Signed)
Patient was late for her physical appt.  Provider consulted and per provider patient was past the limit so would have to reschedule her appt.  Patient was upset about not being seen and wanted to make sure she would not be charged a no show fee since she did show up. I assured her she would not be charged a no show fee.  Patient still left angry.  (*During the middle of all this we received a call from 911 that one of our patients had died.  The staff was visibly upset and for some reason the patient thought this had something to do with her and this upset her as well)

## 2021-04-27 ENCOUNTER — Ambulatory Visit (INDEPENDENT_AMBULATORY_CARE_PROVIDER_SITE_OTHER): Payer: PPO | Admitting: Nurse Practitioner

## 2021-04-27 ENCOUNTER — Other Ambulatory Visit: Payer: Self-pay

## 2021-04-27 VITALS — BP 132/77 | HR 85 | Temp 97.1°F | Ht 65.0 in | Wt 201.8 lb

## 2021-04-27 DIAGNOSIS — Z23 Encounter for immunization: Secondary | ICD-10-CM | POA: Diagnosis not present

## 2021-04-27 DIAGNOSIS — Z01419 Encounter for gynecological examination (general) (routine) without abnormal findings: Secondary | ICD-10-CM

## 2021-04-27 NOTE — Progress Notes (Signed)
Subjective:    Patient ID: Jennifer Coleman, female    DOB: 10/26/1951, 70 y.o.   MRN: [id number]  HPI The patient comes in today for a wellness visit.    A review of their health history was completed.  A review of medications was also completed.  Any needed refills; none  Eating habits: trying to eat healthy  Falls/  MVA accidents in past few months: No  Regular exercise: Walk every am and try to walk in pm and some weight machines on Saturday  Specialist pt sees on regular basis: no  Preventative health issues were discussed.   Additional concerns: curious about calcium and vit d Depression screen [facility] 2/9 04/27/2021 03/01/2021 11/11/2020 11/13/2019 11/13/2019  Decreased Interest 0 0 0 0 0  Down, Depressed, Hopeless 0 0 0 0 0  PHQ - 2 Score 0 0 0 0 0  Altered sleeping - 0 - - -  Tired, decreased energy - 0 - - -  Change in appetite - 0 - - -  Feeling bad or failure about yourself  - 0 - - -  Trouble concentrating - 0 - - -  Moving slowly or fidgety/restless - 0 - - -  Suicidal thoughts - 0 - - -  PHQ-9 Score - 0 - - -  Difficult doing work/chores - Not difficult at all - - -  Some recent data might be hidden   Regular vision and dental exams.    Review of Systems  Constitutional:  Negative for activity change, appetite change and fatigue.  Respiratory:  Negative for cough, chest tightness, shortness of breath and wheezing.   Cardiovascular:  Negative for chest pain.  Gastrointestinal:  Negative for abdominal distention, abdominal pain, blood in stool, constipation, diarrhea, nausea and vomiting.  Genitourinary:  Negative for difficulty urinating, dysuria, enuresis, frequency, genital sores, pelvic pain, urgency, vaginal bleeding and vaginal discharge.      Objective:   Physical Exam Constitutional:      General: She is not in acute distress.    Appearance: She is well-developed.  Neck:     Thyroid: No thyromegaly.     Trachea: No tracheal deviation.     Comments:  Thyroid non tender to palpation. No mass or goiter noted.  Cardiovascular:     Rate and Rhythm: Normal rate and regular rhythm.     Heart sounds: Normal heart sounds. No murmur heard. Pulmonary:     Effort: Pulmonary effort is normal.     Breath sounds: Normal breath sounds.  Abdominal:     General: There is no distension.     Palpations: Abdomen is soft.     Tenderness: There is no abdominal tenderness.  Genitourinary:    Vagina: Normal.     Comments: External GU: no rashes or lesions; atrophy noted. Vagina: pale and dry. Cervix normal in appearance. No CMT. Bimanual: no tenderness or obvious masses.  Musculoskeletal:     Cervical back: Normal range of motion and neck supple.  Lymphadenopathy:     Cervical: No cervical adenopathy.  Skin:    General: Skin is warm and dry.     Findings: No rash.     Comments: Significant sun damage noted.   Neurological:     Mental Status: She is alert and oriented to person, place, and time.  Psychiatric:        Mood and Affect: Mood normal.        Behavior: Behavior normal.  Thought Content: Thought content normal.        Judgment: Judgment normal.   Today's Vitals   04/27/21 0945  BP: 132/77  Pulse: 85  Temp: (!) 97.1 F (36.2 C)  TempSrc: Oral  Weight: 201 lb 12.8 oz (91.5 kg)  Height: $Remove'5\' 5"'tRtOsBP$  (1.651 m)   Body mass index is 33.58 kg/m.  Results for orders placed or performed in visit on 03/01/21  CBC  Result Value Ref Range   WBC 6.6 3.4 - 10.8 x10E3/uL   RBC 4.41 3.77 - 5.28 x10E6/uL   Hemoglobin 13.7 11.1 - 15.9 g/dL   Hematocrit 41.6 34.0 - 46.6 %   MCV 94 79 - 97 fL   MCH 31.1 26.6 - 33.0 pg   MCHC 32.9 31.5 - 35.7 g/dL   RDW 12.0 11.7 - 15.4 %   Platelets 240 150 - 450 x10E3/uL  CMP14+EGFR  Result Value Ref Range   Glucose 80 65 - 99 mg/dL   BUN 18 8 - 27 mg/dL   Creatinine, Ser 1.02 (H) 0.57 - 1.00 mg/dL   eGFR 60 >59 mL/min/1.73   BUN/Creatinine Ratio 18 12 - 28   Sodium 144 134 - 144 mmol/L   Potassium 4.6  3.5 - 5.2 mmol/L   Chloride 105 96 - 106 mmol/L   CO2 25 20 - 29 mmol/L   Calcium 10.1 8.7 - 10.3 mg/dL   Total Protein 6.7 6.0 - 8.5 g/dL   Albumin 4.1 3.8 - 4.8 g/dL   Globulin, Total 2.6 1.5 - 4.5 g/dL   Albumin/Globulin Ratio 1.6 1.2 - 2.2   Bilirubin Total 0.4 0.0 - 1.2 mg/dL   Alkaline Phosphatase 85 44 - 121 IU/L   AST 18 0 - 40 IU/L   ALT 13 0 - 32 IU/L  Lipid panel  Result Value Ref Range   Cholesterol, Total 248 (H) 100 - 199 mg/dL   Triglycerides 71 0 - 149 mg/dL   HDL 98 >39 mg/dL   VLDL Cholesterol Cal 12 5 - 40 mg/dL   LDL Chol Calc (NIH) 138 (H) 0 - 99 mg/dL   Chol/HDL Ratio 2.5 0.0 - 4.4 ratio   Labs reviewed with patient during visit.        Assessment & Plan:  Well woman exam  Need for vaccination - Plan: Pneumococcal conjugate vaccine 13-valent IM Prevnar 13 today. Continue regular skin cancer screening. Recommend shingles vaccine. Last DEXA 11/25/19.  Return in about 6 months (around 10/27/2021).

## 2021-04-27 NOTE — Patient Instructions (Signed)
Jennifer Coleman

## 2021-04-28 ENCOUNTER — Encounter: Payer: Self-pay | Admitting: Nurse Practitioner

## 2021-05-09 ENCOUNTER — Other Ambulatory Visit (HOSPITAL_COMMUNITY): Payer: Self-pay

## 2021-05-09 ENCOUNTER — Encounter: Payer: Self-pay | Admitting: Physician Assistant

## 2021-05-09 ENCOUNTER — Other Ambulatory Visit: Payer: Self-pay

## 2021-05-09 ENCOUNTER — Other Ambulatory Visit: Payer: Self-pay | Admitting: Physician Assistant

## 2021-05-09 ENCOUNTER — Ambulatory Visit: Payer: PPO | Admitting: Physician Assistant

## 2021-05-09 DIAGNOSIS — L570 Actinic keratosis: Secondary | ICD-10-CM | POA: Diagnosis not present

## 2021-05-09 DIAGNOSIS — Z1283 Encounter for screening for malignant neoplasm of skin: Secondary | ICD-10-CM

## 2021-05-09 DIAGNOSIS — Z85828 Personal history of other malignant neoplasm of skin: Secondary | ICD-10-CM

## 2021-05-09 DIAGNOSIS — D0471 Carcinoma in situ of skin of right lower limb, including hip: Secondary | ICD-10-CM | POA: Diagnosis not present

## 2021-05-09 DIAGNOSIS — D044 Carcinoma in situ of skin of scalp and neck: Secondary | ICD-10-CM | POA: Diagnosis not present

## 2021-05-09 DIAGNOSIS — D485 Neoplasm of uncertain behavior of skin: Secondary | ICD-10-CM | POA: Diagnosis not present

## 2021-05-09 DIAGNOSIS — D0462 Carcinoma in situ of skin of left upper limb, including shoulder: Secondary | ICD-10-CM

## 2021-05-09 DIAGNOSIS — C4492 Squamous cell carcinoma of skin, unspecified: Secondary | ICD-10-CM

## 2021-05-09 HISTORY — DX: Squamous cell carcinoma of skin, unspecified: C44.92

## 2021-05-09 MED ORDER — MUPIROCIN 2 % EX OINT
1.0000 | TOPICAL_OINTMENT | Freq: Every day | CUTANEOUS | 0 refills | Status: DC
Start: 2021-05-09 — End: 2021-08-02
  Filled 2021-05-09: qty 22, 15d supply, fill #0
  Filled 2021-05-13: qty 22, 22d supply, fill #0

## 2021-05-09 NOTE — Patient Instructions (Signed)

## 2021-05-10 ENCOUNTER — Encounter: Payer: Self-pay | Admitting: Physician Assistant

## 2021-05-10 NOTE — Progress Notes (Addendum)
Follow-Up Visit   Subjective  Jennifer Coleman is a 70 y.o. female who presents for the following: Skin Problem (Patient here today for lesion on left upper leg x 1 week per patient she thought it was just a bite, now it's just red, scaly and hard. Also check lesion on left forearm x 2 months no bleeding, no pain just red. Check lesion right upper arm x couple months, per patient scaly and red. Recheck lesion on right lower leg was previous SCC per patient the edge of it is now scaly, and recheck MOH's site on left check per patient there's a small bump there now. Personal history of non mole skin cancer. ).   The following portions of the chart were reviewed this encounter and updated as appropriate:  Tobacco  Allergies  Meds  Problems  Med Hx  Surg Hx  Fam Hx      Objective  Well appearing patient in no apparent distress; mood and affect are within normal limits.  A full examination was performed including scalp, head, eyes, ears, nose, lips, neck, chest, axillae, abdomen, back, buttocks, bilateral upper extremities, bilateral lower extremities, hands, feet, fingers, toes, fingernails, and toenails. All findings within normal limits unless otherwise noted below.  Chest - Medial (Center) (2), Left Forearm - Posterior, Left Hand - Posterior, Left Upper Back (2), Right Upper Arm - Posterior Erythematous patches with gritty scale.  Right Upper Arm - Posterior Hyperkeratotic scale with pink base        Left Anterior Neck       Left Upper Arm - Posterior       Right Inner Lower Leg Scaly pink papule or plaque.        Assessment & Plan  AK (actinic keratosis) (7) Left Hand - Posterior; Right Upper Arm - Posterior; Left Forearm - Posterior; Chest - Medial (Center) (2); Left Upper Back (2)  Destruction of lesion - Chest - Medial (Center), Left Forearm - Posterior, Left Hand - Posterior, Left Upper Back, Right Upper Arm - Posterior Complexity: simple   Destruction  method: cryotherapy   Informed consent: discussed and consent obtained   Timeout:  patient name, date of birth, surgical site, and procedure verified Lesion destroyed using liquid nitrogen: Yes   Cryotherapy cycles:  3 Outcome: patient tolerated procedure well with no complications   Post-procedure details: wound care instructions given    Neoplasm of uncertain behavior of skin (3) Right Upper Arm - Posterior  Skin / nail biopsy Type of biopsy: tangential   Informed consent: discussed and consent obtained   Timeout: patient name, date of birth, surgical site, and procedure verified   Procedure prep:  Patient was prepped and draped in usual sterile fashion (Non sterile) Prep type:  Chlorhexidine Anesthesia: the lesion was anesthetized in a standard fashion   Anesthetic:  1% lidocaine w/ epinephrine 1-100,000 local infiltration Instrument used: flexible razor blade   Hemostasis achieved with: aluminum chloride and ferric subsulfate   Outcome: patient tolerated procedure well   Post-procedure details: sterile dressing applied and wound care instructions given   Dressing type: bandage and petrolatum    Specimen 1 - Surgical pathology Differential Diagnosis: R/O BCC  Check Margins: No  Left Anterior Neck  Skin / nail biopsy Type of biopsy: tangential   Informed consent: discussed and consent obtained   Timeout: patient name, date of birth, surgical site, and procedure verified   Procedure prep:  Patient was prepped and draped in usual sterile fashion (Non  sterile) Prep type:  Chlorhexidine Anesthesia: the lesion was anesthetized in a standard fashion   Anesthetic:  1% lidocaine w/ epinephrine 1-100,000 local infiltration Instrument used: flexible razor blade   Hemostasis achieved with: aluminum chloride and ferric subsulfate   Outcome: patient tolerated procedure well   Post-procedure details: sterile dressing applied and wound care instructions given   Dressing type: bandage  and petrolatum    Specimen 2 - Surgical pathology Differential Diagnosis: R/O BCC   Check Margins: No  Left Upper Arm - Posterior  Skin / nail biopsy Type of biopsy: tangential   Informed consent: discussed and consent obtained   Timeout: patient name, date of birth, surgical site, and procedure verified   Procedure prep:  Patient was prepped and draped in usual sterile fashion (Non sterile) Prep type:  Chlorhexidine Anesthesia: the lesion was anesthetized in a standard fashion   Anesthetic:  1% lidocaine w/ epinephrine 1-100,000 local infiltration Instrument used: flexible razor blade   Hemostasis achieved with: aluminum chloride and ferric subsulfate   Outcome: patient tolerated procedure well   Post-procedure details: sterile dressing applied and wound care instructions given   Dressing type: petrolatum and bandage    Specimen 3 - Surgical pathology Differential Diagnosis: R/O BCC   Check Margins: No  Related Medications mupirocin ointment (BACTROBAN) 2 % Apply 1 application topically daily.  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 Right Inner Lower Leg  Skin / nail biopsy Type of biopsy: tangential   Informed consent: discussed and consent obtained   Timeout: patient name, date of birth, surgical site, and procedure verified   Procedure prep:  Patient was prepped and draped in usual sterile fashion (Non sterile) Prep type:  Chlorhexidine Anesthesia: the lesion was anesthetized in a standard fashion   Anesthetic:  1% lidocaine w/ epinephrine 1-100,000 local infiltration Instrument used: flexible razor blade   Hemostasis achieved with: aluminum chloride and ferric subsulfate   Outcome: patient tolerated procedure well   Post-procedure details: sterile dressing applied and wound care instructions given   Dressing type: pressure dressing and petrolatum    mupirocin ointment (BACTROBAN) 2 % Apply 1 application topically daily.  Destruction  of lesion Complexity: simple   Destruction method: electrodesiccation and curettage   Informed consent: discussed and consent obtained   Timeout:  patient name, date of birth, surgical site, and procedure verified Anesthesia: the lesion was anesthetized in a standard fashion   Anesthetic:  1% lidocaine w/ epinephrine 1-100,000 local infiltration Curettage performed in three different directions: Yes   Electrodesiccation performed over the curetted area: Yes   Curettage cycles:  3 Margin per side (cm):  0.1 Final wound size (cm):  1.2 Hemostasis achieved with:  aluminum chloride Outcome: patient tolerated procedure well with no complications   Post-procedure details: wound care instructions given    Specimen 4 - Surgical pathology Differential Diagnosis: R/O BCC  Check Margins: No   I, Timmie Calix, PA-C, have reviewed all documentation's for this visit.  The documentation on 05/16/21 for the exam, diagnosis, procedures and orders are all accurate and complete.

## 2021-05-13 ENCOUNTER — Other Ambulatory Visit (HOSPITAL_COMMUNITY): Payer: Self-pay

## 2021-05-15 ENCOUNTER — Other Ambulatory Visit (HOSPITAL_COMMUNITY): Payer: Self-pay

## 2021-05-17 ENCOUNTER — Telehealth: Payer: Self-pay | Admitting: *Deleted

## 2021-05-17 ENCOUNTER — Telehealth: Payer: Self-pay | Admitting: Physician Assistant

## 2021-05-17 NOTE — Telephone Encounter (Signed)
Left message for patient to return our call for pathology results.

## 2021-05-24 ENCOUNTER — Encounter: Payer: Self-pay | Admitting: Physician Assistant

## 2021-05-24 ENCOUNTER — Ambulatory Visit: Payer: PPO | Admitting: Physician Assistant

## 2021-05-24 ENCOUNTER — Other Ambulatory Visit: Payer: Self-pay

## 2021-05-24 DIAGNOSIS — D044 Carcinoma in situ of skin of scalp and neck: Secondary | ICD-10-CM | POA: Diagnosis not present

## 2021-05-24 DIAGNOSIS — D0462 Carcinoma in situ of skin of left upper limb, including shoulder: Secondary | ICD-10-CM | POA: Diagnosis not present

## 2021-05-24 NOTE — Progress Notes (Signed)
   Follow-Up Visit   Subjective  Jennifer Coleman is a 70 y.o. female who presents for the following: Procedure (Patient here for treatment of CIS x 2. Healed well.   The following portions of the chart were reviewed this encounter and updated as appropriate:  Tobacco  Allergies  Meds  Problems  Med Hx  Surg Hx  Fam Hx      Objective  Well appearing patient in no apparent distress; mood and affect are within normal limits.  A focused examination was performed including neck, extremities, and back. Relevant physical exam findings are noted in the Assessment and Plan.  Left Upper Arm - Posterior Ulceration with surrounding redness.  Neck - Anterior Crust on a red base. Lesion identified by Vida Roller and nurse in room.     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 Left Upper Arm - Posterior  Destruction of lesion Complexity: simple   Destruction method: electrodesiccation and curettage   Informed consent: discussed and consent obtained   Timeout:  patient name, date of birth, surgical site, and procedure verified Anesthesia: the lesion was anesthetized in a standard fashion   Anesthetic:  1% lidocaine w/ epinephrine 1-100,000 local infiltration Curettage performed in three different directions: Yes   Electrodesiccation performed over the curetted area: Yes   Curettage cycles:  3 Final wound size (cm):  2 Hemostasis achieved with:  ferric subsulfate Outcome: patient tolerated procedure well with no complications   Additional details:  Wound innoculated with 5 fluorouracil solution.  Carcinoma in situ of skin of neck Neck - Anterior  Destruction of lesion Complexity: simple   Destruction method: electrodesiccation and curettage   Informed consent: discussed and consent obtained   Timeout:  patient name, date of birth, surgical site, and procedure verified Anesthesia: the lesion was anesthetized in a standard fashion    Anesthetic:  1% lidocaine w/ epinephrine 1-100,000 local infiltration Curettage performed in three different directions: Yes   Electrodesiccation performed over the curetted area: Yes   Curettage cycles:  3 Final wound size (cm):  1 Hemostasis achieved with:  ferric subsulfate Outcome: patient tolerated procedure well with no complications   Additional details:  Wound innoculated with 5 fluorouracil solution.    I, Xaria Judon, PA-C, have reviewed all documentation's for this visit.  The documentation on 05/24/21 for the exam, diagnosis, procedures and orders are all accurate and complete.

## 2021-05-24 NOTE — Patient Instructions (Signed)

## 2021-05-31 ENCOUNTER — Other Ambulatory Visit (HOSPITAL_COMMUNITY): Payer: Self-pay

## 2021-06-06 ENCOUNTER — Ambulatory Visit
Admission: EM | Admit: 2021-06-06 | Discharge: 2021-06-06 | Disposition: A | Discharge status: Home / Self Care | Payer: PPO | Attending: Family Medicine | Admitting: Family Medicine

## 2021-06-06 ENCOUNTER — Encounter: Payer: Self-pay | Admitting: Emergency Medicine

## 2021-06-06 DIAGNOSIS — B029 Zoster without complications: Secondary | ICD-10-CM | POA: Diagnosis not present

## 2021-06-06 MED ORDER — VALACYCLOVIR HCL 1 G PO TABS: 1000.0000 mg | ORAL_TABLET | Freq: Two times a day (BID) | ORAL | 0 refills | Status: AC

## 2021-06-06 MED ORDER — TRIAMCINOLONE ACETONIDE 0.025 % EX OINT: 1.0000 "application " | TOPICAL_OINTMENT | Freq: Three times a day (TID) | CUTANEOUS | 0 refills | Status: DC

## 2021-06-06 NOTE — Discharge Instructions (Addendum)
Start Valtrex twice daily for 10 days. Apply triamcinolone up three times daily.

## 2021-06-06 NOTE — ED Notes (Signed)
Pt states she has never been allergic to acyclovir,  Allergy removed from list.  Pt states she is allergic to levaquin and that may have gotten confused.  New allergy added.

## 2021-06-06 NOTE — ED Triage Notes (Signed)
Painful red rash to LT breast area and LT scapula area that came up Friday.

## 2021-06-06 NOTE — ED Provider Notes (Signed)
[facility]    CSN: [id number] Arrival date & time: 06/06/21  1350      History   Chief Complaint Chief Complaint  Patient presents with   Rash    HPI Jennifer Coleman is a 70 y.o. female.   HPI Patient presents today with a painful reddened rash initially developed at the lower left breast and distributed to the left scapula. Rashes been present for total of 3 days.  Patient has a personal history of skin cancer.  She is followed by dermatology. Reports that the rash is itchy and burning sensation.  Denies fever. Attempted conservative treatment without relief of symptoms   Past Medical History:  Diagnosis Date   Anxiety    Arthritis    Basal cell carcinoma 02/02/2016   Left auricle - MOHs   Dysrhythmia    PVCs   Family history of anesthesia complication 25 yrs ago   Sister allergic to sccinocholine - unable to come off ventilator   Hypertension    Pneumonia    PONV (postoperative nausea and vomiting)    Premature ventricular contractions    Shingles    Squamous cell carcinoma of skin 11/14/2011   right cheek - CX3 + 5FU   Squamous cell carcinoma of skin 11/14/2011   right canthus, superior - CX3 + 5FU   Squamous cell carcinoma of skin 11/14/2011   left cheek - CX3 + 5FU   Squamous cell carcinoma of skin 07/26/2015   right forearm - CX3 + 5FU   Squamous cell carcinoma of skin 02/02/2016   left tip shoulder - CX3 + cautery + 5FU    Squamous cell carcinoma of skin 07/17/2016   left post shoulder - tx after biopsy   Squamous cell carcinoma of skin 07/17/2016   right low shin - tx after biopsy   Squamous cell carcinoma of skin 03/13/2019   left post shoulder - tx after biopsy   Squamous cell carcinoma of skin 03/13/2019   right inner ankle - tx after biopsy   Squamous cell carcinoma of skin 09/23/2019   left cheek - tx after biopsy + MOHs (Dr. 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   Fracture of radial head, left, closed 05/27/2019   Traumatic hematoma of knee 05/27/2019   Postoperative anemia due to acute blood loss 11/24/2018   Acute cholecystitis 11/20/2018   Impingement syndrome of left shoulder 09/30/2018   Unspecified rotator cuff tear or rupture of right shoulder, not specified as traumatic 09/30/2018   Acromioclavicular joint arthritis 09/30/2018   Sun-damaged skin 01/03/2018   Morbid obesity ([place]) 11/05/2017   Depression with anxiety 11/05/2017   Nontraumatic incomplete tear of right rotator cuff 09/25/2016   Foreign body of shoulder right  09/25/2016   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   Shoulder joint painful on movement, right 08/29/2016   Epigastric pain 08/11/2015   Chest pain, rule out acute myocardial infarction 08/11/2015  Renal insufficiency 08/11/2015   Venous stasis 04/13/2015   Depression 04/13/2015   Essential hypertension 09/12/2014   PVCs (premature ventricular contractions) 09/07/2014   Precordial pain 09/07/2014    Past Surgical History:  Procedure Laterality Date   CARPAL TUNNEL RELEASE     CERVICAL [place]   Fusion   CHOLECYSTECTOMY N/A 11/21/2018   Procedure: LAPAROSCOPIC CHOLECYSTECTOMY;  Surgeon: Virl Cagey, MD;  Location: [facility];  Service: General;  Laterality: N/A;   COLONOSCOPY N/A 06/07/2016   Procedure: COLONOSCOPY;  Surgeon: Rogene Houston, MD;  Location: [facility];  Service: Endoscopy;  Laterality: N/A;  1:00   KNEE ARTHROSCOPY  09/16/2012   Procedure: ARTHROSCOPY  KNEE;  Surgeon: [name], MD;  Location: [facility];  Service: Orthopedics;  Laterality: Right;  Right Knee Arthroscopy   LAPAROSCOPIC GASTRIC SLEEVE RESECTION N/A 08/17/2019   Procedure: LAPAROSCOPIC GASTRIC SLEEVE RESECTION AND HIATAL HERNIA REPAIR, UPPPER ENDO AND ERAS PATHWAY;  Surgeon: Johnathan Hausen, MD;  Location: [facility];  Service: General;  Laterality: N/A;   SALPINGOOPHORECTOMY Left 1985   SHOULDER ARTHROSCOPY WITH OPEN ROTATOR CUFF REPAIR AND DISTAL CLAVICLE ACROMINECTOMY Right 05/03/2016   Procedure: RIGHT SHOULDER ARTHROSCOPY WITH MINI-OPEN ROTATOR CUFF REPAIR,DISTAL CLAVICLE RESECTION, SUBACROMIAL DECOMPRESSION.;  Surgeon: [name], MD;  Location: [facility];  Service: Orthopedics;  Laterality: Right;   SHOULDER ARTHROSCOPY WITH ROTATOR CUFF REPAIR Right 09/25/2016   Procedure: Right Shoulder Manipulation, Arthroscopic Debridement of Joint, Removal of Loose Body and Re-Repair of Rotator Cuff;  Surgeon: [name], MD;  Location: [employer];  Service: Orthopedics;  Laterality: Right;    OB History   No obstetric history on file.      Home Medications    Prior to Admission medications   Medication Sig Start Date End Date Taking? Authorizing Provider  triamcinolone (KENALOG) 0.025 % ointment Apply 1 application topically 3 (three) times daily. 06/06/21  Yes Scot Jun, FNP  valACYclovir (VALTREX) 1000 MG tablet Take 1 tablet (1,000 mg total) by mouth 2 (two) times daily for 10 days. 06/06/21 06/16/21 Yes Scot Jun, FNP  alendronate (FOSAMAX) 70 MG tablet TAKE 1 TABLET BY MOUTH EVERY 7 DAYS WITH A FULL GLASS OF WATER ON AN EMPTY STOMACH 12/06/20 12/06/21  Nilda Simmer, NP  cetirizine (ZYRTEC) 10 MG tablet Take 10 mg by mouth daily.    [provider]  diclofenac sodium (VOLTAREN) 1 % GEL APPLY TWO GRAMS TO SHOULDER UP TO FOUR TIMES DAILY Patient taking differently: Apply 2 g topically daily as needed (pain). Knee 06/09/19    [name], MD  enalapril (VASOTEC) 20 MG tablet TAKE 1 TABLET BY MOUTH TWO TIMES DAILY 03/01/21 03/01/22  Elvia Collum M, DO  escitalopram (LEXAPRO) 20 MG tablet TAKE 1 TABLET BY MOUTH ONCE A DAY 03/01/21 03/01/22  Lovena Le, Malena M, DO  fluticasone (FLONASE) 50 MCG/ACT nasal spray PLACE 2 SPRAYS INTO BOTH NOSTRILS DAILY. Patient taking differently: Place 2 sprays into both nostrils daily as needed for allergies. 02/06/19   Mikey Kirschner, MD  hydrochlorothiazide (HYDRODIURIL) 25 MG tablet Take 25 mg by mouth as needed (Fluid).     [provider]  Multiple Vitamins-Minerals (ONE-A-DAY WOMENS 50 PLUS PO) Take 1 tablet by mouth daily.    [provider]  mupirocin ointment (BACTROBAN) 2 % Apply 1 application topically daily. 05/09/21   Sheffield, Kelli R, PA-C  PROAIR HFA 108 (90 Base) MCG/ACT inhaler INHALE TWO PUFFS BY MOUTH  EVERY 4 TO 6 HOURS AS NEEDED FOR FOR WHEEZING 11/11/20   Chalmers Guest, NP    Family History Family History  Problem Relation Age of Onset   Heart disease Sister    Heart disease Brother        55's   Diabetes Mellitus II Sister    Multiple myeloma Mother    Hypertension Sister    Hypertension Father    Heart attack Father     Social History Social History   Tobacco Use   Smoking status: Former    Types: Cigarettes    Quit date: 09/22/1992    Years since quitting: 28.7   Smokeless tobacco: Never  Vaping Use   Vaping Use: Never used  Substance Use Topics   Alcohol use: No    Alcohol/week: 0.0 standard drinks   Drug use: No     Allergies   Penicillins, Shellfish allergy, Levaquin [levofloxacin], Codeine, and Morphine and related   Review of Systems Review of Systems Pertinent negatives listed in HPI  Physical Exam Triage Vital Signs ED Triage Vitals  Enc Vitals Group     BP 06/06/21 1623 (!) 143/72     Pulse Rate 06/06/21 1623 61     Resp 06/06/21 1623 18     Temp 06/06/21 1623 98.6 F (37 C)     Temp Source 06/06/21  1623 Oral     SpO2 06/06/21 1623 97 %     Weight --      Height --      Head Circumference --      Peak Flow --      Pain Score 06/06/21 1624 4     Pain Loc --      Pain Edu? --      Excl. in [place]? --    No data found.  Updated Vital Signs BP (!) 143/72 (BP Location: Right Arm)   Pulse 61   Temp 98.6 F (37 C) (Oral)   Resp 18   SpO2 97%   Visual Acuity Right Eye Distance:   Left Eye Distance:   Bilateral Distance:    Right Eye Near:   Left Eye Near:    Bilateral Near:     Physical Exam General appearance: Alert, well developed, well nourished, cooperative Head: Normocephalic, without obvious abnormality, atraumatic Respiratory: Respirations even and unlabored, normal respiratory rate Heart: rate and rhythm normal. No gallop or murmurs noted on exam  Chest wall: zoster patterned,  vesicular erythematous raised rash present left lower breast under axilla terminating at left scapula  Extremities: No gross deformities Skin: Skin color, texture, turgor normal. No rashes seen  Psych: Appropriate mood and affect. Neurologic: Mental status: Alert, oriented to person, place, and time, thought content appropriate.   UC Treatments / Results  Labs (all labs ordered are listed, but only abnormal results are displayed) Labs Reviewed - No data to display  EKG   Radiology No results found.  Procedures Procedures (including critical care time)  Medications Ordered in UC Medications - No data to display  Initial Impression / Assessment and Plan / UC Course  I have reviewed the triage vital signs and the nursing notes.  Pertinent labs & imaging results that were available during my care of the patient were reviewed by me and considered in my medical decision making (see chart for details).     Shingles rash Treatment per discharge medication orders Continue to monitor for worsening of symptoms. Follow-up with PCP as needed. Red flag  precautions given if rash worsens  and does not improve. Final Clinical Impressions(s) / UC Diagnoses   Final diagnoses:  Herpes zoster without complication     Discharge Instructions      Start Valtrex twice daily for 10 days. Apply triamcinolone up three times daily.     ED Prescriptions     Medication Sig Dispense Auth. Provider   valACYclovir (VALTREX) 1000 MG tablet Take 1 tablet (1,000 mg total) by mouth 2 (two) times daily for 10 days. 20 tablet Scot Jun, FNP   triamcinolone (KENALOG) 0.025 % ointment Apply 1 application topically 3 (three) times daily. 180 g Scot Jun, FNP      PDMP not reviewed this encounter.   Scot Jun, FNP 06/13/21 0830

## 2021-06-28 ENCOUNTER — Ambulatory Visit: Payer: 59 | Admitting: Physician Assistant

## 2021-08-01 ENCOUNTER — Encounter: Payer: Self-pay | Admitting: Physician Assistant

## 2021-08-01 ENCOUNTER — Other Ambulatory Visit: Payer: Self-pay

## 2021-08-01 ENCOUNTER — Ambulatory Visit: Payer: PPO | Admitting: Physician Assistant

## 2021-08-01 DIAGNOSIS — C44722 Squamous cell carcinoma of skin of right lower limb, including hip: Secondary | ICD-10-CM

## 2021-08-01 DIAGNOSIS — D0471 Carcinoma in situ of skin of right lower limb, including hip: Secondary | ICD-10-CM

## 2021-08-01 DIAGNOSIS — L570 Actinic keratosis: Secondary | ICD-10-CM

## 2021-08-01 DIAGNOSIS — Z85828 Personal history of other malignant neoplasm of skin: Secondary | ICD-10-CM

## 2021-08-01 DIAGNOSIS — D485 Neoplasm of uncertain behavior of skin: Secondary | ICD-10-CM

## 2021-08-01 NOTE — Patient Instructions (Signed)

## 2021-08-02 ENCOUNTER — Encounter: Payer: Self-pay | Admitting: Physician Assistant

## 2021-08-02 ENCOUNTER — Other Ambulatory Visit (HOSPITAL_COMMUNITY): Payer: Self-pay

## 2021-08-02 MED ORDER — MUPIROCIN 2 % EX OINT
1.0000 "application " | TOPICAL_OINTMENT | Freq: Every day | CUTANEOUS | 0 refills | Status: DC
  Filled 2021-08-02: qty 22, 22d supply, fill #0

## 2021-08-02 NOTE — Progress Notes (Addendum)
   Follow-Up Visit   Subjective  Jennifer Coleman is a 70 y.o. female who presents for the following: Follow-up (Patient here today for her 2 month follow up per patient she has two lesions on her right calf and one on her left shin. Recheck MOH's site on left ear. No bleeding. Personal history of non mole skin cancer. ).   The following portions of the chart were reviewed this encounter and updated as appropriate:  Tobacco  Allergies  Meds  Problems  Med Hx  Surg Hx  Fam Hx      Objective  Well appearing patient in no apparent distress; mood and affect are within normal limits.  A full examination was performed including scalp, head, eyes, ears, nose, lips, neck, chest, axillae, abdomen, back, buttocks, bilateral upper extremities, bilateral lower extremities, hands, feet, fingers, toes, fingernails, and toenails. All findings within normal limits unless otherwise noted below.  Left Lower Leg - Anterior (3) Erythematous patches with gritty scale.  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  SCC (squamous cell carcinoma), leg, right Right Lower Leg - Posterior  Skin / nail biopsy Type of biopsy: tangential   Informed consent: discussed and consent obtained   Timeout: patient name, date of birth,  surgical site, and procedure verified   Anesthesia: the lesion was anesthetized in a standard fashion   Anesthetic:  1% lidocaine w/ epinephrine 1-100,000 local infiltration Instrument used: flexible razor blade   Hemostasis achieved with: aluminum chloride and electrodesiccation   Outcome: patient tolerated procedure well   Post-procedure details: wound care instructions given    Destruction of lesion Complexity: simple   Destruction method: electrodesiccation and curettage   Informed consent: discussed and consent obtained   Timeout:  patient name, date of birth, surgical site, and procedure verified Anesthesia: the lesion was anesthetized in a standard fashion   Anesthetic:  1% lidocaine w/ epinephrine 1-100,000 local infiltration Curettage performed in three different directions: Yes   Curettage cycles:  3 Margin per side (cm):  0.1 Final wound size (cm):  1.2 Hemostasis achieved with:  aluminum chloride Outcome: patient tolerated procedure well with no complications   Post-procedure details: wound care instructions given    mupirocin ointment (BACTROBAN) 2 % Apply 1 application topically daily.  Specimen 1 - Surgical pathology Differential Diagnosis: bcc scc tx with biopsy curet an cautery   Check Margins: No   I, Vernee Baines, PA-C, have reviewed all documentation's for this visit.  The documentation on 08/07/21 for the exam, diagnosis, procedures and orders are all accurate and complete.

## 2021-08-04 ENCOUNTER — Other Ambulatory Visit (HOSPITAL_COMMUNITY): Payer: Self-pay | Admitting: Family Medicine

## 2021-08-04 ENCOUNTER — Other Ambulatory Visit (HOSPITAL_COMMUNITY): Payer: Self-pay | Admitting: Nurse Practitioner

## 2021-08-04 DIAGNOSIS — Z1231 Encounter for screening mammogram for malignant neoplasm of breast: Secondary | ICD-10-CM

## 2021-08-07 ENCOUNTER — Telehealth: Payer: Self-pay

## 2021-08-07 DIAGNOSIS — M9903 Segmental and somatic dysfunction of lumbar region: Secondary | ICD-10-CM | POA: Diagnosis not present

## 2021-08-07 DIAGNOSIS — M5442 Lumbago with sciatica, left side: Secondary | ICD-10-CM | POA: Diagnosis not present

## 2021-08-07 NOTE — Telephone Encounter (Signed)
Path to patient November follow already made

## 2021-08-07 NOTE — Telephone Encounter (Signed)
-----   Message from Warren Danes, [place] sent at 08/07/2021 11:25 AM EDT ----- RTC if recurs

## 2021-08-09 ENCOUNTER — Other Ambulatory Visit: Payer: Self-pay

## 2021-08-09 ENCOUNTER — Ambulatory Visit (HOSPITAL_COMMUNITY)
Admission: RE | Admit: 2021-08-09 | Discharge: 2021-08-09 | Disposition: A | Discharge status: Home / Self Care | Payer: PPO | Source: Ambulatory Visit | Attending: Nurse Practitioner | Admitting: Nurse Practitioner

## 2021-08-09 DIAGNOSIS — Z1231 Encounter for screening mammogram for malignant neoplasm of breast: Secondary | ICD-10-CM

## 2021-08-10 DIAGNOSIS — M5442 Lumbago with sciatica, left side: Secondary | ICD-10-CM | POA: Diagnosis not present

## 2021-08-10 DIAGNOSIS — M9903 Segmental and somatic dysfunction of lumbar region: Secondary | ICD-10-CM | POA: Diagnosis not present

## 2021-08-14 DIAGNOSIS — M9903 Segmental and somatic dysfunction of lumbar region: Secondary | ICD-10-CM | POA: Diagnosis not present

## 2021-08-14 DIAGNOSIS — M5442 Lumbago with sciatica, left side: Secondary | ICD-10-CM | POA: Diagnosis not present

## 2021-08-21 DIAGNOSIS — M9903 Segmental and somatic dysfunction of lumbar region: Secondary | ICD-10-CM | POA: Diagnosis not present

## 2021-08-21 DIAGNOSIS — M5442 Lumbago with sciatica, left side: Secondary | ICD-10-CM | POA: Diagnosis not present

## 2021-08-23 DIAGNOSIS — M5442 Lumbago with sciatica, left side: Secondary | ICD-10-CM | POA: Diagnosis not present

## 2021-08-23 DIAGNOSIS — M9903 Segmental and somatic dysfunction of lumbar region: Secondary | ICD-10-CM | POA: Diagnosis not present

## 2021-08-24 ENCOUNTER — Other Ambulatory Visit: Payer: Self-pay | Admitting: Nurse Practitioner

## 2021-08-24 ENCOUNTER — Other Ambulatory Visit: Payer: Self-pay | Admitting: Family Medicine

## 2021-08-24 DIAGNOSIS — F418 Other specified anxiety disorders: Secondary | ICD-10-CM

## 2021-08-24 DIAGNOSIS — I10 Essential (primary) hypertension: Secondary | ICD-10-CM

## 2021-08-25 NOTE — Telephone Encounter (Signed)
Sent my chart message to schedule appointment 08/25/2021

## 2021-08-28 ENCOUNTER — Ambulatory Visit (INDEPENDENT_AMBULATORY_CARE_PROVIDER_SITE_OTHER): Payer: PPO | Admitting: Physician Assistant

## 2021-08-28 ENCOUNTER — Other Ambulatory Visit: Payer: Self-pay

## 2021-08-28 ENCOUNTER — Encounter: Payer: Self-pay | Admitting: Physician Assistant

## 2021-08-28 DIAGNOSIS — M5442 Lumbago with sciatica, left side: Secondary | ICD-10-CM | POA: Diagnosis not present

## 2021-08-28 DIAGNOSIS — L988 Other specified disorders of the skin and subcutaneous tissue: Secondary | ICD-10-CM | POA: Diagnosis not present

## 2021-08-28 DIAGNOSIS — C44629 Squamous cell carcinoma of skin of left upper limb, including shoulder: Secondary | ICD-10-CM | POA: Diagnosis not present

## 2021-08-28 DIAGNOSIS — M9903 Segmental and somatic dysfunction of lumbar region: Secondary | ICD-10-CM | POA: Diagnosis not present

## 2021-08-28 NOTE — Patient Instructions (Signed)

## 2021-08-30 ENCOUNTER — Encounter: Payer: Self-pay | Admitting: Physician Assistant

## 2021-08-30 ENCOUNTER — Encounter: Payer: PPO | Admitting: Physician Assistant

## 2021-08-30 NOTE — Progress Notes (Signed)
   Follow-Up Visit   Subjective  Jennifer Coleman is a 70 y.o. female who presents for the following: Procedure (Patient here today for treatment of SCC x 1 on left upper arm ).   The following portions of the chart were reviewed this encounter and updated as appropriate:  Tobacco  Allergies  Meds  Problems  Med Hx  Surg Hx  Fam Hx      Objective  Well appearing patient in no apparent distress; mood and affect are within normal limits.  A focused examination was performed including left arm. Relevant physical exam findings are noted in the Assessment and Plan.  Left Upper Arm - Posterior Pink plaque   Assessment & Plan  SCC (squamous cell carcinoma), arm, left Left Upper Arm - Posterior  Skin excision  Total excision diameter (cm):  4.3 Informed consent: discussed and consent obtained   Timeout: patient name, date of birth, surgical site, and procedure verified   Anesthesia: the lesion was anesthetized in a standard fashion   Anesthetic:  1% lidocaine w/ epinephrine 1-100,000 local infiltration Instrument used: #15 blade   Hemostasis achieved with: pressure and electrodesiccation   Outcome: patient tolerated procedure well with no complications   Post-procedure details: sterile dressing applied and wound care instructions given   Dressing type: petrolatum and bandage   Additional details:  3-0 vicryl x 4 4-0 Ethilon x 1    Skin repair Complexity:  Simple Final length (cm):  4.3 Informed consent: discussed and consent obtained   Timeout: patient name, date of birth, surgical site, and procedure verified   Procedure prep:  Patient was prepped and draped in usual sterile fashion (non sterile) Prep type:  Chlorhexidine Anesthesia: the lesion was anesthetized in a standard fashion   Undermining: edges undermined   Fine/surface layer approximation (top stitches):  Suture size:  3-0 Suture type: Vicryl Rapide (coated polyglactin 910)   Suture type comment:   Nylon Stitches: simple running   Suture removal (days):  14 Hemostasis achieved with: suture, pressure and electrodesiccation Outcome: patient tolerated procedure well with no complications   Post-procedure details: sterile dressing applied and wound care instructions given   Post-procedure details comment:  Non sterile pressure  Dressing type: bandage and petrolatum    Specimen 1 - Surgical pathology Differential Diagnosis: scc (EXC) [id number] Superior Margin stained  Check Margins: yes    I, Kylynn Street, PA-C, have reviewed all documentation's for this visit.  The documentation on 08/30/21 for the exam, diagnosis, procedures and orders are all accurate and complete.

## 2021-08-31 DIAGNOSIS — M5442 Lumbago with sciatica, left side: Secondary | ICD-10-CM | POA: Diagnosis not present

## 2021-08-31 DIAGNOSIS — M9903 Segmental and somatic dysfunction of lumbar region: Secondary | ICD-10-CM | POA: Diagnosis not present

## 2021-09-02 ENCOUNTER — Encounter: Payer: Self-pay | Admitting: Physician Assistant

## 2021-09-04 DIAGNOSIS — M9903 Segmental and somatic dysfunction of lumbar region: Secondary | ICD-10-CM | POA: Diagnosis not present

## 2021-09-04 DIAGNOSIS — M5442 Lumbago with sciatica, left side: Secondary | ICD-10-CM | POA: Diagnosis not present

## 2021-09-04 NOTE — Telephone Encounter (Signed)
PATH TO PATIENT MARGINS ARE FREE

## 2021-09-06 DIAGNOSIS — M5442 Lumbago with sciatica, left side: Secondary | ICD-10-CM | POA: Diagnosis not present

## 2021-09-06 DIAGNOSIS — M9903 Segmental and somatic dysfunction of lumbar region: Secondary | ICD-10-CM | POA: Diagnosis not present

## 2021-09-11 ENCOUNTER — Ambulatory Visit: Payer: PPO

## 2021-09-11 DIAGNOSIS — M5442 Lumbago with sciatica, left side: Secondary | ICD-10-CM | POA: Diagnosis not present

## 2021-09-11 DIAGNOSIS — M9903 Segmental and somatic dysfunction of lumbar region: Secondary | ICD-10-CM | POA: Diagnosis not present

## 2021-09-12 DIAGNOSIS — H35033 Hypertensive retinopathy, bilateral: Secondary | ICD-10-CM | POA: Diagnosis not present

## 2021-09-12 DIAGNOSIS — H524 Presbyopia: Secondary | ICD-10-CM | POA: Diagnosis not present

## 2021-09-13 DIAGNOSIS — M9903 Segmental and somatic dysfunction of lumbar region: Secondary | ICD-10-CM | POA: Diagnosis not present

## 2021-09-13 DIAGNOSIS — M5442 Lumbago with sciatica, left side: Secondary | ICD-10-CM | POA: Diagnosis not present

## 2021-09-19 DIAGNOSIS — M5442 Lumbago with sciatica, left side: Secondary | ICD-10-CM | POA: Diagnosis not present

## 2021-09-19 DIAGNOSIS — M9903 Segmental and somatic dysfunction of lumbar region: Secondary | ICD-10-CM | POA: Diagnosis not present

## 2021-09-20 DIAGNOSIS — M9903 Segmental and somatic dysfunction of lumbar region: Secondary | ICD-10-CM | POA: Diagnosis not present

## 2021-09-20 DIAGNOSIS — M5442 Lumbago with sciatica, left side: Secondary | ICD-10-CM | POA: Diagnosis not present

## 2021-11-22 ENCOUNTER — Ambulatory Visit (INDEPENDENT_AMBULATORY_CARE_PROVIDER_SITE_OTHER): Payer: PPO | Admitting: Family Medicine

## 2021-11-22 ENCOUNTER — Telehealth: Payer: Self-pay

## 2021-11-22 ENCOUNTER — Encounter (HOSPITAL_COMMUNITY): Payer: Self-pay

## 2021-11-22 ENCOUNTER — Other Ambulatory Visit: Payer: Self-pay

## 2021-11-22 ENCOUNTER — Emergency Department (HOSPITAL_COMMUNITY)
Admission: EM | Admit: 2021-11-22 | Discharge: 2021-11-22 | Disposition: A | Discharge status: Home / Self Care | Payer: PPO | Attending: Emergency Medicine | Admitting: Emergency Medicine

## 2021-11-22 ENCOUNTER — Emergency Department (HOSPITAL_COMMUNITY): Payer: PPO

## 2021-11-22 VITALS — BP 122/72 | HR 52 | Temp 98.3°F | Wt 209.2 lb

## 2021-11-22 DIAGNOSIS — E785 Hyperlipidemia, unspecified: Secondary | ICD-10-CM | POA: Diagnosis not present

## 2021-11-22 DIAGNOSIS — S8991XA Unspecified injury of right lower leg, initial encounter: Secondary | ICD-10-CM | POA: Diagnosis not present

## 2021-11-22 DIAGNOSIS — R519 Headache, unspecified: Secondary | ICD-10-CM | POA: Diagnosis not present

## 2021-11-22 DIAGNOSIS — Y92238 Other place in hospital as the place of occurrence of the external cause: Secondary | ICD-10-CM | POA: Insufficient documentation

## 2021-11-22 DIAGNOSIS — M5442 Lumbago with sciatica, left side: Secondary | ICD-10-CM | POA: Diagnosis not present

## 2021-11-22 DIAGNOSIS — M25561 Pain in right knee: Secondary | ICD-10-CM | POA: Insufficient documentation

## 2021-11-22 DIAGNOSIS — Z9884 Bariatric surgery status: Secondary | ICD-10-CM | POA: Diagnosis not present

## 2021-11-22 DIAGNOSIS — M19011 Primary osteoarthritis, right shoulder: Secondary | ICD-10-CM | POA: Diagnosis not present

## 2021-11-22 DIAGNOSIS — Z130 Encounter for screening for diseases of the blood and blood-forming organs and certain disorders involving the immune mechanism: Secondary | ICD-10-CM

## 2021-11-22 DIAGNOSIS — S60221A Contusion of right hand, initial encounter: Secondary | ICD-10-CM | POA: Insufficient documentation

## 2021-11-22 DIAGNOSIS — S4991XA Unspecified injury of right shoulder and upper arm, initial encounter: Secondary | ICD-10-CM | POA: Diagnosis not present

## 2021-11-22 DIAGNOSIS — M25511 Pain in right shoulder: Secondary | ICD-10-CM | POA: Insufficient documentation

## 2021-11-22 DIAGNOSIS — W01198A Fall on same level from slipping, tripping and stumbling with subsequent striking against other object, initial encounter: Secondary | ICD-10-CM | POA: Insufficient documentation

## 2021-11-22 DIAGNOSIS — F418 Other specified anxiety disorders: Secondary | ICD-10-CM | POA: Diagnosis not present

## 2021-11-22 DIAGNOSIS — G8929 Other chronic pain: Secondary | ICD-10-CM | POA: Diagnosis not present

## 2021-11-22 DIAGNOSIS — N182 Chronic kidney disease, stage 2 (mild): Secondary | ICD-10-CM | POA: Insufficient documentation

## 2021-11-22 DIAGNOSIS — R079 Chest pain, unspecified: Secondary | ICD-10-CM | POA: Diagnosis not present

## 2021-11-22 DIAGNOSIS — M545 Low back pain, unspecified: Secondary | ICD-10-CM | POA: Diagnosis not present

## 2021-11-22 DIAGNOSIS — M1711 Unilateral primary osteoarthritis, right knee: Secondary | ICD-10-CM | POA: Diagnosis not present

## 2021-11-22 DIAGNOSIS — M19041 Primary osteoarthritis, right hand: Secondary | ICD-10-CM | POA: Diagnosis not present

## 2021-11-22 DIAGNOSIS — R0781 Pleurodynia: Secondary | ICD-10-CM | POA: Insufficient documentation

## 2021-11-22 DIAGNOSIS — R001 Bradycardia, unspecified: Secondary | ICD-10-CM | POA: Diagnosis not present

## 2021-11-22 DIAGNOSIS — S6991XA Unspecified injury of right wrist, hand and finger(s), initial encounter: Secondary | ICD-10-CM | POA: Diagnosis not present

## 2021-11-22 DIAGNOSIS — M4126 Other idiopathic scoliosis, lumbar region: Secondary | ICD-10-CM | POA: Diagnosis not present

## 2021-11-22 DIAGNOSIS — S6000XA Contusion of unspecified finger without damage to nail, initial encounter: Secondary | ICD-10-CM

## 2021-11-22 DIAGNOSIS — M542 Cervicalgia: Secondary | ICD-10-CM | POA: Diagnosis not present

## 2021-11-22 DIAGNOSIS — I10 Essential (primary) hypertension: Secondary | ICD-10-CM

## 2021-11-22 DIAGNOSIS — W19XXXA Unspecified fall, initial encounter: Secondary | ICD-10-CM

## 2021-11-22 DIAGNOSIS — S0990XA Unspecified injury of head, initial encounter: Secondary | ICD-10-CM | POA: Diagnosis not present

## 2021-11-22 NOTE — Progress Notes (Signed)
Subjective:  Patient ID: Jennifer Coleman, female    DOB: December 09, 1950  Age: 71 y.o. MRN: [id number]  CC: Chief Complaint  Patient presents with   Establish Care    HPI:  71 year old female with depression anxiety, hypertension, hyperlipidemia, osteoporosis presents for follow-up and to establish care with me.  Current issues are discussed below.  Patient reports ongoing low back pain.  She states that she has had low back pain for the past 5 months.  She has radiation down the left leg as well.  Worse as of late.  She has recently sold her home and is downsizing and has been moving.  She has been seeing a chiropractor without resolution.  No recent imaging.  Additionally, patient states that she is having daily left-sided headaches.  She states that she takes Tylenol with improvement but the headache never resolves.  Patient is concerned that she has long COVID and that this is a symptom of long COVID.  She has had ongoing headaches since having COVID-19 in January 2022.  She states that she has decreased concentration and "brain fog".  She denies any nausea, vomiting associated with her headaches.  No reported weakness or vision changes.  Regarding her hypertension, blood pressures are well controlled.  She is on enalapril and hydrochlorothiazide.  No reported side effects.  She states that in regards to her depression and anxiety she is doing well on Lexapro.  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   Sun-damaged skin 01/03/2018   Depression with anxiety 11/05/2017   Essential hypertension 09/12/2014    Social Hx   Social History   Socioeconomic History   Marital status:  Married    Spouse name: Not on file   Number of children: Not on file   Years of education: Not on file   Highest education level: Not on file  Occupational History   Occupation: [employer]  Tobacco Use   Smoking status: Former    Types: Cigarettes    Quit date: 09/22/1992    Years since quitting: 29.1   Smokeless tobacco: Never  Vaping Use   Vaping Use: Never used  Substance and Sexual Activity   Alcohol use: No    Alcohol/week: 0.0 standard drinks   Drug use: No   Sexual activity: Not Currently  Other Topics Concern   Not on file  Social History Narrative   Lives with husband in [place], [place]   Social Determinants of Health   Financial Resource Strain: Not on file  Food Insecurity: Not on file  Transportation Needs: Not on file  Physical Activity: Not on file  Stress: Not on file  Social Connections: Not on file    Review of Systems Per HPI  Objective:  BP 122/72    Pulse (!) 52    Temp 98.3 F (36.8 C)    Wt 209 lb 3.2 oz (94.9 kg)    SpO2 99%    BMI 34.81 kg/m   BP/Weight 11/22/2021 7/61/9509 12/29/6710  Systolic BP 458 099 833  Diastolic BP 60 72 72  Wt. (Lbs) 209 209.2 -  BMI 34.78 34.81 -    Physical Exam Constitutional:      General: She  is not in acute distress.    Appearance: Normal appearance. She is not ill-appearing.  HENT:     Head: Normocephalic and atraumatic.  Eyes:     General:        Right eye: No discharge.        Left eye: No discharge.     Conjunctiva/sclera: Conjunctivae normal.     Pupils: Pupils are equal, round, and reactive to light.  Cardiovascular:     Rate and Rhythm: Regular rhythm. Bradycardia present.  Pulmonary:     Effort: Pulmonary effort is normal.     Breath sounds: Normal breath sounds. No wheezing, rhonchi or rales.  Neurological:     General: No focal deficit present.     Mental Status: She is alert.  Psychiatric:        Mood and Affect: Mood normal.        Behavior: Behavior normal.    Lab Results  Component  Value Date   WBC 6.6 03/14/2021   HGB 13.7 03/14/2021   HCT 41.6 03/14/2021   PLT 240 03/14/2021   GLUCOSE 80 03/14/2021   CHOL 248 (H) 03/14/2021   TRIG 71 03/14/2021   HDL 98 03/14/2021   LDLCALC 138 (H) 03/14/2021   ALT 13 03/14/2021   AST 18 03/14/2021   NA 144 03/14/2021   K 4.6 03/14/2021   CL 105 03/14/2021   CREATININE 1.02 (H) 03/14/2021   BUN 18 03/14/2021   CO2 25 03/14/2021   TSH 2.630 11/08/2017   INR 1.08 11/23/2018     Assessment & Plan:   Problem List Items Addressed This Visit       Cardiovascular and Mediastinum   Essential hypertension    Stable, at goal.  Continue enalapril and HCTZ.      Relevant Orders   CMP14+EGFR     Nervous and Auditory   Chronic midline low back pain with left-sided sciatica - Primary    X-ray for further evaluation today.      Relevant Orders   DG Lumbar Spine Complete     Other   Depression with anxiety    Stable.  Continue Lexapro.      S/P laparoscopic sleeve gastrectomy   Relevant Orders   CBC   Hyperlipidemia   Relevant Orders   Lipid panel   Bradycardia    Patient noted to be bradycardic today.  This is atypical for her.  EKG was obtained and was independently reviewed by me.  Interpretation: Sinus bradycardia at the rate of 50.  Normal axis.  Right bundle branch block.   We will continue to monitor.      Relevant Orders   EKG 12-Lead (Completed)   Chronic daily headache    Given age and persistent headache, referring to neurology.      Relevant Orders   Ambulatory referral to Neurology   Other Visit Diagnoses     Screening for deficiency anemia       Relevant Orders   CBC      Follow-up:  Return in about 6 months (around 05/22/2022).  [place]

## 2021-11-22 NOTE — Discharge Instructions (Addendum)
Was a pleasure taking care of you today!  Your imaging studies in the ED did not show acute fracture, dislocation, bleed.  You may take over-the-counter Tylenol every 6 hours as needed for pain management.  You may apply ice to the affected areas for up to 15 minutes at a time.  Sure to place a barrier between your skin and the ice.  You may follow-up with your primary care provider as needed.  Return to the ED if you are experiencing increasing/worsening pain, swelling, worsening symptoms.

## 2021-11-22 NOTE — Assessment & Plan Note (Signed)
Given age and persistent headache, referring to neurology.

## 2021-11-22 NOTE — ED Notes (Signed)
Patient transported to X-ray 

## 2021-11-22 NOTE — ED Triage Notes (Signed)
Patient states she was here in the main entry to get an outpatient back x-ray when she tripped up her feet and fell into the wall. She hit her right knee on the floor, right shoulder and right frontal head on the wall. Patient states headache at this time,.

## 2021-11-22 NOTE — Assessment & Plan Note (Signed)
X-ray for further evaluation today. 

## 2021-11-22 NOTE — Telephone Encounter (Signed)
Patient has been informed per provider notes and recommendations, pt verbalizes understanding.

## 2021-11-22 NOTE — Assessment & Plan Note (Signed)
Stable, at goal.  Continue enalapril and HCTZ.

## 2021-11-22 NOTE — Assessment & Plan Note (Signed)
Patient noted to be bradycardic today.  This is atypical for her.  EKG was obtained and was independently reviewed by me.  Interpretation: Sinus bradycardia at the rate of 50.  Normal axis.  Right bundle branch block.   We will continue to monitor.

## 2021-11-22 NOTE — Patient Instructions (Addendum)
Labs today.  Xray at the hospital.  I am referring you to neurology for the headaches.  Follow up in 6 months.

## 2021-11-22 NOTE — ED Notes (Signed)
Patient transported to CT 

## 2021-11-22 NOTE — Telephone Encounter (Signed)
Patient calls and says she saw on her visit today she has a diagnoses of chronic kidney disease. She is not aware of it, please advise.

## 2021-11-22 NOTE — Assessment & Plan Note (Signed)
Stable.  Continue Lexapro. 

## 2021-11-22 NOTE — ED Provider Notes (Signed)
[facility] Provider Note   CSN: [id number] Arrival date & time: 11/22/21  1215     History  Chief Complaint  Patient presents with   Jennifer Coleman is a 71 y.o. female who presents to the Emergency Department complaining of a fall onset prior to arrival.  Patient was in the median treatment, to get outpatient x-ray of her back when she had a mechanical fall causing her to trip and fall into the wall.  Patient notes that she hit her right knee on the floor, right shoulder, right frontal head on the wall.  Patient has associated headache, bruising noted to right hand, right hand pain, right knee pain, right shoulder pain, left lateral rib pain.  Has not tried a medication for his symptoms.  Denies LOC, vision changes, nausea, vomiting, chest pain, shortness of breath, dizziness, lightheadedness,  swelling.  Denies anticoagulant use.       The history is provided by the patient. No language interpreter was used.        Home Medications Prior to Admission medications   Medication Sig Start Date End Date Taking? Authorizing Provider  alendronate (FOSAMAX) 70 MG tablet TAKE ONE TABLET BY MOUTH ONCE A WEEK IN THE MORNING WITH A FULL GLASS OF WATER ON AN EMPTY STOMACH. DO NOT LAY DOWN FOR 30 MINUTES. 08/25/21   Nilda Simmer, NP  cetirizine (ZYRTEC) 10 MG tablet Take 10 mg by mouth daily.    [provider]  diclofenac sodium (VOLTAREN) 1 % GEL APPLY TWO GRAMS TO SHOULDER UP TO FOUR TIMES DAILY Patient taking differently: Apply 2 g topically daily as needed (pain). Knee 06/09/19   [name], MD  enalapril (VASOTEC) 20 MG tablet TAKE ONE TABLET BY MOUTH TWICE DAILY 08/25/21   Nilda Simmer, NP  escitalopram (LEXAPRO) 20 MG tablet TAKE ONE TABLET BY MOUTH DAILY 08/25/21   Nilda Simmer, NP  fluticasone (FLONASE) 50 MCG/ACT nasal spray PLACE 2 SPRAYS INTO BOTH NOSTRILS DAILY. Patient taking differently: Place 2 sprays into both  nostrils daily as needed for allergies. 02/06/19   Mikey Kirschner, MD  hydrochlorothiazide (HYDRODIURIL) 25 MG tablet Take 25 mg by mouth as needed (Fluid).     [provider]  Multiple Vitamins-Minerals (ONE-A-DAY WOMENS 50 PLUS PO) Take 1 tablet by mouth daily.    [provider]  PROAIR HFA 108 [phone] Base) MCG/ACT inhaler INHALE TWO PUFFS BY MOUTH EVERY 4 TO 6 HOURS AS NEEDED FOR FOR WHEEZING 11/11/20   Chalmers Guest, NP      Allergies    Penicillins, Shellfish allergy, Levaquin [levofloxacin], Codeine, and Morphine and related    Review of Systems   Review of Systems  All other systems reviewed and are negative.  Physical Exam Updated Vital Signs BP (!) 135/91    Pulse (!) 56    Temp 97.9 F (36.6 C) (Oral)    Resp 15    Ht 5\' 5"  (1.651 m)    Wt 94.8 kg    SpO2 99%    BMI 34.78 kg/m  Physical Exam Vitals and nursing note reviewed.  Constitutional:      General: She is not in acute distress.    Appearance: She is not diaphoretic.  HENT:     Head: Normocephalic and atraumatic.     Mouth/Throat:     Pharynx: No oropharyngeal exudate.  Eyes:     General: No scleral icterus.    Conjunctiva/sclera:  Conjunctivae normal.  Cardiovascular:     Rate and Rhythm: Normal rate and regular rhythm.     Pulses: Normal pulses.     Heart sounds: Normal heart sounds.  Pulmonary:     Effort: Pulmonary effort is normal. No respiratory distress.     Breath sounds: Normal breath sounds. No wheezing.  Abdominal:     General: Bowel sounds are normal.     Palpations: Abdomen is soft. There is no mass.     Tenderness: There is no abdominal tenderness. There is no guarding or rebound.  Musculoskeletal:        General: Normal range of motion.     Right shoulder: Tenderness present. No swelling, deformity or bony tenderness. Normal range of motion.     Left shoulder: Normal.     Right elbow: Normal.     Left elbow: Normal.     Right wrist: Normal.     Left wrist: Normal.      Right hand: No swelling, deformity, lacerations, tenderness or bony tenderness. Normal range of motion. Normal strength. Normal sensation. Normal capillary refill. Normal pulse.     Left hand: Normal.     Cervical back: Normal, normal range of motion and neck supple.     Thoracic back: Normal.     Lumbar back: Normal.     Right knee: Normal.     Left knee: No swelling, deformity, effusion or bony tenderness. Normal range of motion. Tenderness present over the lateral joint line.     Comments: No spinal tenderness to palpation.  Full active range of motion of neck.  Bruising noted to right hand.  No tenderness to palpation noted to the right hand.  Thumb to finger opposition intact.  Full active range of motion of right hand, right wrist.  Able to supinate and pronate right elbow without pain.  No tenderness to palpation to right biceps tendon.  Mild tenderness to palpation to right shoulder.  No clavicular tenderness to palpation.  No obvious deformities noted.  Mild tenderness to palpation to lateral aspect of the right knee.  Full extension and flexion of right knee.  Tenderness to palpation noted to left lateral ribs with no overlying deformities, ecchymosis, step-offs.    Skin:    General: Skin is warm and dry.  Neurological:     Mental Status: She is alert.     Motor: No pronator drift.     Comments: Negative pronator drift.  Normal finger-to-nose testing.  Sensation intact.  Psychiatric:        Behavior: Behavior normal.    ED Results / Procedures / Treatments   Labs (all labs ordered are listed, but only abnormal results are displayed) Labs Reviewed - No data to display  EKG None  Radiology DG Chest 2 View  Result Date: 11/22/2021 CLINICAL DATA:  Tripped and fell striking wall and floor, pain at multiple sites including LEFT lower ribs, headache, LEFT lower back EXAM: CHEST - 2 VIEW COMPARISON:  11/27/2018 FINDINGS: Normal heart size, mediastinal contours, and pulmonary  vascularity. Lungs clear. No infiltrate, pleural effusion, or pneumothorax. Osseous demineralization with prior resection versus resorption of the distal clavicles, unchanged on LEFT, new on RIGHT. IMPRESSION: No acute abnormalities. Electronically Signed   By: Lavonia Dana M.D.   On: 11/22/2021 14:51   DG Lumbar Spine Complete  Result Date: 11/22/2021 CLINICAL DATA:  Tripped and fell striking wall and floor, pain at multiple sites including LEFT lower ribs, headache, LEFT lower back EXAM: LUMBAR  SPINE - COMPLETE 4+ VIEW COMPARISON:  None; correlation CT abdomen and pelvis 11/20/2018 FINDINGS: Five lumbar vertebra. Osseous demineralization with levoconvex lumbar scoliosis. Mild scattered disc space narrowing and endplate spur formation. Facet degenerative changes lumbar spine. Vertebral body heights maintained without fracture, subluxation, or bone destruction. No spondylolysis. SI joints preserved. BILATERAL pelvic phleboliths and RIGHT upper quadrant surgical clips from cholecystectomy noted. IMPRESSION: Osseous demineralization with degenerative disc/facet disease changes of lumbar spine and levoconvex scoliosis. No acute abnormalities. Electronically Signed   By: Lavonia Dana M.D.   On: 11/22/2021 14:54   DG Shoulder Right  Result Date: 11/22/2021 CLINICAL DATA:  Tripped and fell striking wall and floor, pain at multiple sites including LEFT lower ribs, headache, LEFT lower back. RIGHT shoulder struck wall EXAM: RIGHT SHOULDER - 2+ VIEW COMPARISON:  None FINDINGS: Osseous demineralization. Distal RIGHT clavicular resection versus resorption with mild degenerative changes at [facility] joint. Visualized ribs intact. No acute fracture, dislocation, or bone destruction. IMPRESSION: Degenerative changes at RIGHT [facility] joint with prior resection versus resorption at distal RIGHT clavicle new since 2020. No acute abnormalities. Electronically Signed   By: Lavonia Dana M.D.   On: 11/22/2021 14:49   DG Knee 2 Views  Right  Result Date: 11/22/2021 CLINICAL DATA:  Tripped and fell striking wall and floor, pain at multiple sites including LEFT lower ribs, headache, LEFT lower back, RIGHT knee struck floor laterally EXAM: RIGHT KNEE - 1-2 VIEW COMPARISON:  None FINDINGS: Osseous demineralization. Lateral compartment joint space narrowing. Minimal patellofemoral degenerative changes as well. No acute fracture, dislocation, or bone destruction. No joint effusion. IMPRESSION: No acute osseous abnormalities. Osseous demineralization with mild degenerative changes RIGHT knee. Electronically Signed   By: Lavonia Dana M.D.   On: 11/22/2021 14:52   CT Head Wo Contrast  Result Date: 11/22/2021 CLINICAL DATA:  Head trauma, minor (Age >= 65y) Headache and neck pain after fall while walking into hospital today. EXAM: CT HEAD WITHOUT CONTRAST TECHNIQUE: Contiguous axial images were obtained from the base of the skull through the vertex without intravenous contrast. RADIATION DOSE REDUCTION: This exam was performed according to the departmental dose-optimization program which includes automated exposure control, adjustment of the mA and/or kV according to patient size and/or use of iterative reconstruction technique. COMPARISON:  Head CT 05/01/2016 FINDINGS: Brain: No intracranial hemorrhage, mass effect, or midline shift. No hydrocephalus. The basilar cisterns are patent. No evidence of territorial infarct or acute ischemia. No extra-axial or intracranial fluid collection. Vascular: No hyperdense vessel or unexpected calcification. Skull: Normal. Negative for fracture or focal lesion. Sinuses/Orbits: Paranasal sinuses and mastoid air cells are clear. The visualized orbits are unremarkable. Other: No confluent scalp contusion. IMPRESSION: No acute intracranial abnormality. No skull fracture. Electronically Signed   By: Keith Rake M.D.   On: 11/22/2021 15:23   CT Cervical Spine Wo Contrast  Result Date: 11/22/2021 CLINICAL DATA:   fall Headache and neck pain after fall while walking into hospital today. EXAM: CT CERVICAL SPINE WITHOUT CONTRAST TECHNIQUE: Multidetector CT imaging of the cervical spine was performed without intravenous contrast. Multiplanar CT image reconstructions were also generated. RADIATION DOSE REDUCTION: This exam was performed according to the departmental dose-optimization program which includes automated exposure control, adjustment of the mA and/or kV according to patient size and/or use of iterative reconstruction technique. COMPARISON:  Report from remote cervical MRI 12/08/2003 FINDINGS: Alignment: Straightening of normal lordosis. No traumatic subluxation. Skull base and vertebrae: No acute fracture. Vertebral body heights are maintained. The dens and skull  base are intact. Soft tissues and spinal canal: No prevertebral fluid or swelling. No visible canal hematoma. Disc levels: C5-C6 fusion with small disc space. Minor disc space narrowing at C4-C5 and C3-C4. Occasional facet hypertrophy. Upper chest: No acute findings Other: None. IMPRESSION: 1. No acute fracture or traumatic subluxation of the cervical spine. 2. Straightening of normal lordosis may be due to positioning or muscle spasm. 3. C5-C6 vertebral body fusion with small disc space. Electronically Signed   By: Keith Rake M.D.   On: 11/22/2021 15:27   DG Hand 2 View Right  Result Date: 11/22/2021 CLINICAL DATA:  Tripped and fell striking wall and floor, pain at multiple sites including LEFT lower ribs, headache, LEFT lower back EXAM: RIGHT HAND - 2 VIEW COMPARISON:  None FINDINGS: Osseous demineralization. Mild degenerative changes with joint space narrowing at distal radioulnar joint. Remaining joint spaces preserved. No acute fracture, dislocation, or bone destruction. IMPRESSION: No acute osseous abnormalities. Electronically Signed   By: Lavonia Dana M.D.   On: 11/22/2021 14:48    Procedures Procedures    Medications Ordered in  ED Medications - No data to display  ED Course/ Medical Decision Making/ A&P Clinical Course as of 11/22/21 1804  Wed Nov 22, 2021  1701 Dicussed with patient imaging results at bedside. Discussed discharge treatment plan. Pt agreeable at this time. Pt appears safe for discharge. [SB]    Clinical Course User Index [SB] Meghann Landing A, PA-C                           Medical Decision Making Amount and/or Complexity of Data Reviewed Radiology: ordered.   Pt with fall occurring prior to arrival.  Patient notes that she had a mechanical fall causing her to trip and fall into the wall.  Patient with right knee, right shoulder, right frontal headache.  Vital signs stable.  Denies anticoagulant use. On exam, patient without spinal tenderness to palpation.  Full active range of motion of neck.  Bruising noted to right hand. No tenderness to palpation noted to the right hand.  Thumb to finger opposition intact.  Full active range of motion of right hand, right wrist.  Able to supinate and pronate right elbow without pain. Mild tenderness to palpation to right shoulder.  No clavicular tenderness to palpation.  Mild tenderness to palpation to lateral aspect of the right knee with full range of motion.  Tenderness to palpation to left lateral ribs.  No obvious deformities noted.   No acute cardiovascular, respiratory, abdominal exam findings.  Differential diagnosis includes fracture, dislocation, intracranial bleed.    Imaging: I ordered imaging studies including CT cervical spine, CT head, right knee x-ray, chest x-ray, right shoulder x-ray, right hand x-ray, lumbar spine x-ray. I independently visualized and interpreted imaging which showed:  CT head without acute intracranial abnormality. CT cervical spine without acute fracture or dislocation. Right knee x-ray without acute fracture or dislocation. Right shoulder x-ray without acute fracture or dislocation.  Lumbar x-ray without acute fracture or  dislocation.  (Lumbar x-ray was to be obtained as outpatient as per her primary care provider, not a part of my assessment) I agree with the radiologist interpretation   Disposition: Patient presentation suspicious for normal muscle soreness status post fall.  Doubt fracture, dislocation, intracranial bleed at this time.  Patient neurovascularly intact.  Offered medication in the ED, to which patient declined at this time.  Patient notes that she is ready to  eat after being discharged and declines medications at this time.  After consideration of the diagnostic results and the patients response to treatment, I feel that the patient would benefit from discharge home. Supportive care measures and strict return precautions discussed with patient at bedside. Pt acknowledges and verbalizes understanding. Pt appears safe for discharge. Follow up as indicated in discharge paperwork.    This chart was dictated using voice recognition software, Dragon. Despite the best efforts of this provider to proofread and correct errors, errors may still occur which can change documentation meaning.   Final Clinical Impression(s) / ED Diagnoses Final diagnoses:  Fall, initial encounter  Minor head injury, initial encounter  Acute pain of right shoulder  Acute pain of right knee  Contusion of finger of right hand, initial encounter  Rib pain on left side    Rx / DC Orders ED Discharge Orders     None         Chavez Rosol A, PA-C 11/22/21 1804    Noemi Chapel, MD 12/03/21 1456

## 2021-11-23 LAB — CBC
Hematocrit: 40.1 % (ref 34.0–46.6)
Hemoglobin: 13.4 g/dL (ref 11.1–15.9)
MCH: 31.8 pg (ref 26.6–33.0)
MCHC: 33.4 g/dL (ref 31.5–35.7)
MCV: 95 fL (ref 79–97)
Platelets: 243 10*3/uL (ref 150–450)
RBC: 4.22 x10E6/uL (ref 3.77–5.28)
RDW: 12.2 % (ref 11.7–15.4)
WBC: 6.4 10*3/uL (ref 3.4–10.8)

## 2021-11-23 LAB — CMP14+EGFR
ALT: 15 IU/L (ref 0–32)
AST: 20 IU/L (ref 0–40)
Albumin/Globulin Ratio: 2 (ref 1.2–2.2)
Albumin: 4.3 g/dL (ref 3.8–4.8)
Alkaline Phosphatase: 81 IU/L (ref 44–121)
BUN/Creatinine Ratio: 24 (ref 12–28)
BUN: 20 mg/dL (ref 8–27)
Bilirubin Total: 0.3 mg/dL (ref 0.0–1.2)
CO2: 26 mmol/L (ref 20–29)
Calcium: 10 mg/dL (ref 8.7–10.3)
Chloride: 106 mmol/L (ref 96–106)
Creatinine, Ser: 0.82 mg/dL (ref 0.57–1.00)
Globulin, Total: 2.1 g/dL (ref 1.5–4.5)
Glucose: 83 mg/dL (ref 70–99)
Potassium: 4.3 mmol/L (ref 3.5–5.2)
Sodium: 143 mmol/L (ref 134–144)
Total Protein: 6.4 g/dL (ref 6.0–8.5)
eGFR: 78 mL/min/1.73

## 2021-11-23 LAB — LIPID PANEL
Chol/HDL Ratio: 2.3 ratio (ref 0.0–4.4)
Cholesterol, Total: 211 mg/dL — ABNORMAL HIGH (ref 100–199)
HDL: 92 mg/dL
LDL Chol Calc (NIH): 105 mg/dL — ABNORMAL HIGH (ref 0–99)
Triglycerides: 82 mg/dL (ref 0–149)
VLDL Cholesterol Cal: 14 mg/dL (ref 5–40)

## 2021-11-27 ENCOUNTER — Telehealth: Payer: Self-pay | Admitting: *Deleted

## 2021-11-28 NOTE — Telephone Encounter (Signed)
error 

## 2021-12-28 ENCOUNTER — Ambulatory Visit (INDEPENDENT_AMBULATORY_CARE_PROVIDER_SITE_OTHER): Payer: PPO | Admitting: Family Medicine

## 2021-12-28 ENCOUNTER — Other Ambulatory Visit: Payer: Self-pay

## 2021-12-28 VITALS — BP 128/78 | HR 97 | Temp 98.2°F | Ht 65.0 in | Wt 203.4 lb

## 2021-12-28 DIAGNOSIS — R519 Headache, unspecified: Secondary | ICD-10-CM

## 2021-12-28 DIAGNOSIS — M5442 Lumbago with sciatica, left side: Secondary | ICD-10-CM

## 2021-12-28 DIAGNOSIS — F418 Other specified anxiety disorders: Secondary | ICD-10-CM

## 2021-12-28 DIAGNOSIS — I10 Essential (primary) hypertension: Secondary | ICD-10-CM

## 2021-12-28 DIAGNOSIS — G8929 Other chronic pain: Secondary | ICD-10-CM | POA: Diagnosis not present

## 2021-12-28 MED ORDER — ENALAPRIL MALEATE 20 MG PO TABS: 20.0000 mg | ORAL_TABLET | Freq: Two times a day (BID) | ORAL | 1 refills | Status: DC

## 2021-12-28 MED ORDER — HYDROCHLOROTHIAZIDE 25 MG PO TABS: 25.0000 mg | ORAL_TABLET | ORAL | 1 refills | Status: DC | PRN

## 2021-12-28 MED ORDER — ALENDRONATE SODIUM 70 MG PO TABS: ORAL_TABLET | ORAL | 1 refills | Status: DC

## 2021-12-28 MED ORDER — ESCITALOPRAM OXALATE 20 MG PO TABS: 20.0000 mg | ORAL_TABLET | Freq: Every day | ORAL | 1 refills | Status: DC

## 2021-12-28 NOTE — Assessment & Plan Note (Signed)
I have spoken with neurology.  They will call her to schedule. ?

## 2021-12-28 NOTE — Progress Notes (Signed)
? ?Subjective:  ?Patient ID: Jennifer Coleman, female    DOB: Aug 28, 1951  Age: 71 y.o. MRN: [id number] ? ?CC: ?Chief Complaint  ?Patient presents with  ? Follow-up  ?  On back pain.  Patient states pain hasn't gotten any better  ? Medication Refill  ?  All medications  ? ? ?HPI: ? ?71 year old female with hypertension, chronic back pain, osteoporosis, depression and anxiety, hyperlipidemia presents for follow-up. ? ?Patient recently seen for chronic back pain.  She went to go get her x-ray at the hospital and suffered a fall and had to be evaluated in the emergency room. ? ?Patient reports continued low back pain which radiates down the left leg.  X-ray lumbar was obtained and revealed degenerative disc and facet disease.  She states that she has daily pain.  She takes naproxen daily without significant improvement/resolution.  Patient would like to discuss the next step in the management of her ongoing back pain.  ? ?Patient also continues to have headaches.  She states that she has a remote history of migraine headache.  She states that this occurred when she was younger and improved after stopping oral contraceptives.  She states that she has a daily left-sided headache.  She states that she feels like she has an associated aura.  No nausea or vomiting.  No reports of significant photophobia.  Patient has expressed concern that this is related to COVID-19.  Has been going on for the past 8 to 9 months.  I have placed a referral but she did not schedule the appointment.  ? ?Patient also reports ongoing abdominal cramping particularly with certain movements.  She notes that she has a cramp in her upper abdomen when she bends over or when she moves in certain directions. ? ?Patient Active Problem List  ? Diagnosis Date Noted  ? Hyperlipidemia 11/22/2021  ? Chronic midline low back pain with left-sided sciatica 11/22/2021  ? Bradycardia 11/22/2021  ? Chronic daily headache 11/22/2021  ? Osteoporosis 12/30/2019  ? 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 Sun-damaged skin 01/03/2018  ? Depression with anxiety 11/05/2017  ? Essential hypertension 09/12/2014  ? ? ?Social Hx   ?Social History  ? ?Socioeconomic History  ? Marital status: Married  ?  Spouse name: Not on file  ? Number of children: Not on file  ? Years of education: Not on file  ? Highest education level: Not on file  ?Occupational History  ? Occupation: [employer]  ?Tobacco Use  ? Smoking status: Former  ?  Types: Cigarettes  ?  Quit date: 09/22/1992  ?  Years since quitting: 29.2  ? Smokeless tobacco: Never  ?Vaping Use  ? Vaping Use: Never used  ?Substance and Sexual Activity  ? Alcohol use: No  ?  Alcohol/week: 0.0 standard drinks  ? Drug use: No  ? Sexual activity: Not Currently  ?Other Topics Concern  ? Not on file  ?Social History Narrative  ? Lives with husband in [place], [place]  ? ?Social Determinants of Health  ? ?Financial Resource Strain: Not on file  ?Food Insecurity: Not on file  ?Transportation Needs: Not on file  ?Physical Activity: Not on file  ?Stress: Not on file  ?Social Connections: Not on file  ? ? ?Review of Systems ?Per HPI ? ?Objective:  ?BP 128/78   Pulse 97   Temp 98.2 ?F (36.8 ?C) (Oral)   Ht 5\' 5"  (1.651 m)   Wt 203 lb 6.4 oz (92.3 kg)  SpO2 98%   BMI 33.85 kg/m?  ? ?BP/Weight 12/28/2021 11/22/2021 11/22/2021  ?Systolic BP 244 010 272  ?Diastolic BP 78 91 72  ?Wt. (Lbs) 203.4 209 209.2  ?BMI 33.85 34.78 34.81  ? ? ?Physical Exam ?Vitals and nursing note reviewed.  ?Constitutional:   ?   General: She is not in acute distress. ?   Appearance: Normal appearance. She is not ill-appearing.  ?HENT:  ?   Head: Normocephalic and atraumatic.  ?Eyes:  ?   General:     ?   Right eye: No discharge.     ?   Left eye: No discharge.  ?   Conjunctiva/sclera: Conjunctivae normal.  ?Cardiovascular:  ?   Rate and Rhythm: Normal rate and regular rhythm.  ?Pulmonary:  ?   Effort: Pulmonary effort is normal.  ?   Breath sounds: Normal breath sounds. No  wheezing, rhonchi or rales.  ?Neurological:  ?   Mental Status: She is alert.  ?Psychiatric:     ?   Mood and Affect: Mood normal.     ?   Behavior: Behavior normal.  ? ? ?Lab Results  ?Component Value Date  ? WBC 6.4 11/22/2021  ? HGB 13.4 11/22/2021  ? HCT 40.1 11/22/2021  ? PLT 243 11/22/2021  ? GLUCOSE 83 11/22/2021  ? CHOL 211 (H) 11/22/2021  ? TRIG 82 11/22/2021  ? HDL 92 11/22/2021  ? LDLCALC 105 (H) 11/22/2021  ? ALT 15 11/22/2021  ? AST 20 11/22/2021  ? NA 143 11/22/2021  ? K 4.3 11/22/2021  ? CL 106 11/22/2021  ? CREATININE 0.82 11/22/2021  ? BUN 20 11/22/2021  ? CO2 26 11/22/2021  ? TSH 2.630 11/08/2017  ? INR 1.08 11/23/2018  ? ? ? ?Assessment & Plan:  ? ?Problem List Items Addressed This Visit   ? ?  ? Cardiovascular and Mediastinum  ? Essential hypertension  ?  Hypertension is stable.  Continue current medications. ?  ?  ? Relevant Medications  ? enalapril (VASOTEC) 20 MG tablet  ? hydrochlorothiazide (HYDRODIURIL) 25 MG tablet  ?  ? Nervous and Auditory  ? Chronic midline low back pain with left-sided sciatica - Primary  ?  Referring to orthopedic surgery for further evaluation.  Needs MRI.  Continue daily naproxen for now. ?  ?  ? Relevant Medications  ? escitalopram (LEXAPRO) 20 MG tablet  ? Other Relevant Orders  ? Ambulatory referral to Orthopedic Surgery  ?  ? Other  ? Depression with anxiety  ? Relevant Medications  ? escitalopram (LEXAPRO) 20 MG tablet  ? Chronic daily headache  ?  I have spoken with neurology.  They will call her to schedule. ?  ?  ? Relevant Medications  ? escitalopram (LEXAPRO) 20 MG tablet  ? ? ?Meds ordered this encounter  ?Medications  ? alendronate (FOSAMAX) 70 MG tablet  ?  Sig: TAKE ONE TABLET BY MOUTH ONCE A WEEK IN THE MORNING WITH A FULL GLASS OF WATER ON AN EMPTY STOMACH. DO NOT LAY DOWN FOR 30 MINUTES.  ?  Dispense:  12 tablet  ?  Refill:  1  ? enalapril (VASOTEC) 20 MG tablet  ?  Sig: Take 1 tablet (20 mg total) by mouth 2 (two) times daily.  ?  Dispense:  180  tablet  ?  Refill:  1  ?  This prescription was filled on 08/24/2021. Any refills authorized will be placed on file.  ? escitalopram (LEXAPRO) 20 MG tablet  ?  Sig: Take 1 tablet (20 mg total) by mouth daily.  ?  Dispense:  90 tablet  ?  Refill:  1  ? hydrochlorothiazide (HYDRODIURIL) 25 MG tablet  ?  Sig: Take 1 tablet (25 mg total) by mouth as needed (Fluid).  ?  Dispense:  90 tablet  ?  Refill:  1  ? ? ?Follow-up:  Return in about 6 months (around 06/30/2022). ? ?Thersa Salt DO ?[place] ? ?

## 2021-12-28 NOTE — Assessment & Plan Note (Signed)
Hypertension is stable.  Continue current medications. 

## 2021-12-28 NOTE — Patient Instructions (Addendum)
I have refilled your medications. ? ?I have placed a referral to orthopedics. Continue Aleve as needed. ? ?I reached out to neurology. They will call and get you scheduled. ? ?Follow up in 6 months.  ? ?Take care ? ?Dr. Lacinda Axon  ?

## 2021-12-28 NOTE — Assessment & Plan Note (Addendum)
Referring to orthopedic surgery for further evaluation.  Needs MRI.  Continue daily naproxen for now. ?

## 2022-01-16 DIAGNOSIS — M5416 Radiculopathy, lumbar region: Secondary | ICD-10-CM | POA: Diagnosis not present

## 2022-01-16 DIAGNOSIS — M545 Low back pain, unspecified: Secondary | ICD-10-CM | POA: Diagnosis not present

## 2022-01-22 ENCOUNTER — Other Ambulatory Visit: Payer: Self-pay | Admitting: Family Medicine

## 2022-01-22 NOTE — Progress Notes (Signed)
error 

## 2022-01-23 ENCOUNTER — Other Ambulatory Visit: Payer: Self-pay | Admitting: Orthopedic Surgery

## 2022-01-23 DIAGNOSIS — M5416 Radiculopathy, lumbar region: Secondary | ICD-10-CM

## 2022-01-29 ENCOUNTER — Ambulatory Visit
Admission: RE | Admit: 2022-01-29 | Discharge: 2022-01-29 | Disposition: A | Discharge status: Home / Self Care | Payer: PPO | Source: Ambulatory Visit | Attending: Orthopedic Surgery | Admitting: Orthopedic Surgery

## 2022-01-29 DIAGNOSIS — M545 Low back pain, unspecified: Secondary | ICD-10-CM | POA: Diagnosis not present

## 2022-01-29 DIAGNOSIS — M5416 Radiculopathy, lumbar region: Secondary | ICD-10-CM

## 2022-01-29 DIAGNOSIS — M48061 Spinal stenosis, lumbar region without neurogenic claudication: Secondary | ICD-10-CM | POA: Diagnosis not present

## 2022-02-19 DIAGNOSIS — M5416 Radiculopathy, lumbar region: Secondary | ICD-10-CM | POA: Diagnosis not present

## 2022-02-20 DIAGNOSIS — M25561 Pain in right knee: Secondary | ICD-10-CM | POA: Diagnosis not present

## 2022-02-27 DIAGNOSIS — M5416 Radiculopathy, lumbar region: Secondary | ICD-10-CM | POA: Diagnosis not present

## 2022-03-14 DIAGNOSIS — M5416 Radiculopathy, lumbar region: Secondary | ICD-10-CM | POA: Diagnosis not present

## 2022-03-15 ENCOUNTER — Encounter (HOSPITAL_COMMUNITY): Payer: Self-pay | Admitting: *Deleted

## 2022-03-27 ENCOUNTER — Telehealth: Payer: Self-pay | Admitting: Family Medicine

## 2022-03-27 NOTE — Telephone Encounter (Signed)
error 

## 2022-04-03 ENCOUNTER — Ambulatory Visit (INDEPENDENT_AMBULATORY_CARE_PROVIDER_SITE_OTHER): Payer: PPO

## 2022-04-03 VITALS — Ht 65.0 in | Wt 196.0 lb

## 2022-04-03 DIAGNOSIS — Z0000 Encounter for general adult medical examination without abnormal findings: Secondary | ICD-10-CM

## 2022-04-03 NOTE — Patient Instructions (Signed)
Jennifer Coleman , Thank you for taking time to come for your Medicare Wellness Visit. I appreciate your ongoing commitment to your health goals. Please review the following plan we discussed and let me know if I can assist you in the future.   Screening recommendations/referrals: Colonoscopy: Done 06/07/2016 Repeat in 10 years  Mammogram: Done 08/09/2021 Repeat annually  Bone Density: Done 11/25/2019 Repeat every 2 years  Recommended yearly ophthalmology/optometry visit for glaucoma screening and checkup Recommended yearly dental visit for hygiene and checkup  Vaccinations: Influenza vaccine: Done 08/25/2021 Repeat annually  Pneumococcal vaccine: Done 04/27/2021 and 08/18/2019 Tdap vaccine: Done 03/01/2016 Repeat in 10 years  Shingles vaccine: Done 02/15/2022 and 08/25/2021   Covid-19:Done 10/27/2020, 01/08/2020 and 12/11/2019.  Advanced directives: Advance directive discussed with you today. I have provided a copy for you to complete at home and have notarized. Once this is complete please bring a copy in to our office so we can scan it into your chart. MAILED OUT TODAY.  Conditions/risks identified: Aim for 30 minutes of exercise or brisk walking, 6-8 glasses of water, and 5 servings of fruits and vegetables each day.   Next appointment: Follow up in one year for your annual wellness visit 2024.   Preventive Care 69 Years and Older, Female Preventive care refers to lifestyle choices and visits with your health care provider that can promote health and wellness. What does preventive care include? A yearly physical exam. This is also called an annual well check. Dental exams once or twice a year. Routine eye exams. Ask your health care provider how often you should have your eyes checked. Personal lifestyle choices, including: Daily care of your teeth and gums. Regular physical activity. Eating a healthy diet. Avoiding tobacco and drug use. Limiting alcohol use. Practicing safe  sex. Taking low-dose aspirin every day. Taking vitamin and mineral supplements as recommended by your health care provider. What happens during an annual well check? The services and screenings done by your health care provider during your annual well check will depend on your age, overall health, lifestyle risk factors, and family history of disease. Counseling  Your health care provider may ask you questions about your: Alcohol use. Tobacco use. Drug use. Emotional well-being. Home and relationship well-being. Sexual activity. Eating habits. History of falls. Memory and ability to understand (cognition). Work and work Statistician. Reproductive health. Screening  You may have the following tests or measurements: Height, weight, and BMI. Blood pressure. Lipid and cholesterol levels. These may be checked every 5 years, or more frequently if you are over 49 years old. Skin check. Lung cancer screening. You may have this screening every year starting at age 64 if you have a 30-pack-year history of smoking and currently smoke or have quit within the past 15 years. Fecal occult blood test (FOBT) of the stool. You may have this test every year starting at age 82. Flexible sigmoidoscopy or colonoscopy. You may have a sigmoidoscopy every 5 years or a colonoscopy every 10 years starting at age 54. Hepatitis C blood test. Hepatitis B blood test. Sexually transmitted disease (STD) testing. Diabetes screening. This is done by checking your blood sugar (glucose) after you have not eaten for a while (fasting). You may have this done every 1-3 years. Bone density scan. This is done to screen for osteoporosis. You may have this done starting at age 1. Mammogram. This may be done every 1-2 years. Talk to your health care provider about how often you should have regular mammograms.  Talk with your health care provider about your test results, treatment options, and if necessary, the need for more  tests. Vaccines  Your health care provider may recommend certain vaccines, such as: Influenza vaccine. This is recommended every year. Tetanus, diphtheria, and acellular pertussis (Tdap, Td) vaccine. You may need a Td booster every 10 years. Zoster vaccine. You may need this after age 15. Pneumococcal 13-valent conjugate (PCV13) vaccine. One dose is recommended after age 86. Pneumococcal polysaccharide (PPSV23) vaccine. One dose is recommended after age 30. Talk to your health care provider about which screenings and vaccines you need and how often you need them. This information is not intended to replace advice given to you by your health care provider. Make sure you discuss any questions you have with your health care provider. Document Released: 11/11/2015 Document Revised: 07/04/2016 Document Reviewed: 08/16/2015 Elsevier Interactive Patient Education  2017 [place] Prevention in the Home Falls can cause injuries. They can happen to people of all ages. There are many things you can do to make your home safe and to help prevent falls. What can I do on the outside of my home? Regularly fix the edges of walkways and driveways and fix any cracks. Remove anything that might make you trip as you walk through a door, such as a raised step or threshold. Trim any bushes or trees on the path to your home. Use bright outdoor lighting. Clear any walking paths of anything that might make someone trip, such as rocks or tools. Regularly check to see if handrails are loose or broken. Make sure that both sides of any steps have handrails. Any raised decks and porches should have guardrails on the edges. Have any leaves, snow, or ice cleared regularly. Use sand or salt on walking paths during winter. Clean up any spills in your garage right away. This includes oil or grease spills. What can I do in the bathroom? Use night lights. Install grab bars by the toilet and in the tub and shower.  Do not use towel bars as grab bars. Use non-skid mats or decals in the tub or shower. If you need to sit down in the shower, use a plastic, non-slip stool. Keep the floor dry. Clean up any water that spills on the floor as soon as it happens. Remove soap buildup in the tub or shower regularly. Attach bath mats securely with double-sided non-slip rug tape. Do not have throw rugs and other things on the floor that can make you trip. What can I do in the bedroom? Use night lights. Make sure that you have a light by your bed that is easy to reach. Do not use any sheets or blankets that are too big for your bed. They should not hang down onto the floor. Have a firm chair that has side arms. You can use this for support while you get dressed. Do not have throw rugs and other things on the floor that can make you trip. What can I do in the kitchen? Clean up any spills right away. Avoid walking on wet floors. Keep items that you use a lot in easy-to-reach places. If you need to reach something above you, use a strong step stool that has a grab bar. Keep electrical cords out of the way. Do not use floor polish or wax that makes floors slippery. If you must use wax, use non-skid floor wax. Do not have throw rugs and other things on the floor that can make  you trip. What can I do with my stairs? Do not leave any items on the stairs. Make sure that there are handrails on both sides of the stairs and use them. Fix handrails that are broken or loose. Make sure that handrails are as long as the stairways. Check any carpeting to make sure that it is firmly attached to the stairs. Fix any carpet that is loose or worn. Avoid having throw rugs at the top or bottom of the stairs. If you do have throw rugs, attach them to the floor with carpet tape. Make sure that you have a light switch at the top of the stairs and the bottom of the stairs. If you do not have them, ask someone to add them for you. What else  can I do to help prevent falls? Wear shoes that: Do not have high heels. Have rubber bottoms. Are comfortable and fit you well. Are closed at the toe. Do not wear sandals. If you use a stepladder: Make sure that it is fully opened. Do not climb a closed stepladder. Make sure that both sides of the stepladder are locked into place. Ask someone to hold it for you, if possible. Clearly mark and make sure that you can see: Any grab bars or handrails. First and last steps. Where the edge of each step is. Use tools that help you move around (mobility aids) if they are needed. These include: Canes. Walkers. Scooters. Crutches. Turn on the lights when you go into a dark area. Replace any light bulbs as soon as they burn out. Set up your furniture so you have a clear path. Avoid moving your furniture around. If any of your floors are uneven, fix them. If there are any pets around you, be aware of where they are. Review your medicines with your doctor. Some medicines can make you feel dizzy. This can increase your chance of falling. Ask your doctor what other things that you can do to help prevent falls. This information is not intended to replace advice given to you by your health care provider. Make sure you discuss any questions you have with your health care provider. Document Released: 08/11/2009 Document Revised: 03/22/2016 Document Reviewed: 11/19/2014 Elsevier Interactive Patient Education  2017 [employer].

## 2022-04-03 NOTE — Progress Notes (Signed)
Subjective:   Jennifer Coleman is a 71 y.o. female who presents for an Initial Medicare Annual Wellness Visit. Virtual Visit via Telephone Note  I connected with  Jennifer Coleman on 04/03/22 at  9:30 AM EDT by telephone and verified that I am speaking with the correct person using two identifiers.  Location: Patient: HOME Provider: [facility] Persons participating in the virtual visit: patient/Nurse Health Advisor   I discussed the limitations, risks, security and privacy concerns of performing an evaluation and management service by telephone and the availability of in person appointments. The patient expressed understanding and agreed to proceed.  Interactive audio and video telecommunications were attempted between this nurse and patient, however failed, due to patient having technical difficulties OR patient did not have access to video capability.  We continued and completed visit with audio only.  Some vital signs may be absent or patient reported.   Chriss Driver, LPN  Review of Systems     Cardiac Risk Factors include: advanced age (>39mn, >>70women);hypertension;dyslipidemia;sedentary lifestyle;obesity (BMI >30kg/m2)     Objective:    Today's Vitals   04/03/22 0931 04/03/22 0932  Weight: 196 lb (88.9 kg)   Height: _0  (1.651 m)   PainSc:  3    Body mass index is 32.62 kg/m.     04/03/2022    9:50 AM 11/22/2021   12:25 PM 08/17/2019   11:35 AM 08/13/2019    2:30 PM 12/30/2018    2:45 PM 11/21/2018    4:53 PM 11/21/2018    1:10 PM  Advanced Directives  Does Patient Have a Medical Advance Directive? _1  No No  Would patient like information on creating a medical advance directive? No - Patient declined  No - Patient declined No - Patient declined No - Patient declined No - Patient declined No - Patient declined    Current Medications (verified) Outpatient Encounter Medications as of 04/03/2022  Medication Sig   alendronate (FOSAMAX) 70 MG tablet TAKE ONE  TABLET BY MOUTH ONCE A WEEK IN THE MORNING WITH A FULL GLASS OF WATER ON AN EMPTY STOMACH. DO NOT LAY DOWN FOR 30 MINUTES.   cetirizine (ZYRTEC) 10 MG tablet Take 10 mg by mouth daily.   diclofenac sodium (VOLTAREN) 1 % GEL APPLY TWO GRAMS TO SHOULDER UP TO FOUR TIMES DAILY (Patient taking differently: Apply 2 g topically daily as needed (pain). Knee)   enalapril (VASOTEC) 20 MG tablet Take 1 tablet (20 mg total) by mouth 2 (two) times daily.   escitalopram (LEXAPRO) 20 MG tablet Take 1 tablet (20 mg total) by mouth daily.   fluticasone (FLONASE) 50 MCG/ACT nasal spray PLACE 2 SPRAYS INTO BOTH NOSTRILS DAILY. (Patient taking differently: Place 2 sprays into both nostrils daily as needed for allergies.)   hydrochlorothiazide (HYDRODIURIL) 25 MG tablet Take 1 tablet (25 mg total) by mouth as needed (Fluid).   methocarbamol (ROBAXIN) 500 MG tablet methocarbamol 500 mg tablet  TAKE ONE TABLET BY MOUTH EVERY 6 HOURS AS NEEDED FOR MUSCLE SPASMS   Multiple Vitamins-Minerals (ONE-A-DAY WOMENS 50 PLUS PO) Take 1 tablet by mouth daily.   PROAIR HFA 108 (90 Base) MCG/ACT inhaler INHALE TWO PUFFS BY MOUTH EVERY 4 TO 6 HOURS AS NEEDED FOR FOR WHEEZING   SHINGRIX injection    valACYclovir (VALTREX) 1000 MG tablet valacyclovir 1 gram tablet  TAKE ONE TABLET BY MOUTH TWICE DAILY FOR 10 DAYS   LYRICA 75 MG capsule Take 75 mg by  mouth 2 (two) times daily. (Patient not taking: Reported on 04/03/2022)   No facility-administered encounter medications on file as of 04/03/2022.    Allergies (verified) Penicillins, Shellfish allergy, Gabapentin, Levaquin [levofloxacin], Lyrica [pregabalin], Codeine, and Morphine and related   History: Past Medical History:  Diagnosis Date   Anxiety    Arthritis    Basal cell carcinoma 02/02/2016   Left auricle - MOHs   Dysrhythmia    PVCs   Family history of anesthesia complication 25 yrs ago   Sister allergic to sccinocholine - unable to come off ventilator   Hypertension     Pneumonia    PONV (postoperative nausea and vomiting)    Premature ventricular contractions    SCC (squamous cell carcinoma) 05/09/2021   in situ- left anterior neck   SCC (squamous cell carcinoma) 05/09/2021   in situ- left upper arm-posterior (EXC)   SCC (squamous cell carcinoma) 05/09/2021   in situ- right inner lower leg   Shingles    Squamous cell carcinoma of skin 11/14/2011   right cheek - CX3 + 5FU   Squamous cell carcinoma of skin 11/14/2011   right canthus, superior - CX3 + 5FU   Squamous cell carcinoma of skin 11/14/2011   left cheek - CX3 + 5FU   Squamous cell carcinoma of skin 07/26/2015   right forearm - CX3 + 5FU   Squamous cell carcinoma of skin 02/02/2016   left tip shoulder - CX3 + cautery + 5FU    Squamous cell carcinoma of skin 07/17/2016   left post shoulder - tx after biopsy   Squamous cell carcinoma of skin 07/17/2016   right low shin - tx after biopsy   Squamous cell carcinoma of skin 03/13/2019   left post shoulder - tx after biopsy   Squamous cell carcinoma of skin 03/13/2019   right inner ankle - tx after biopsy   Squamous cell carcinoma of skin 09/23/2019   left cheek - tx after biopsy + MOHs (Dr. Winifred Olive)   Squamous cell carcinoma of skin 09/23/2019   right upper shin, medial - CX3 + 5FU   Squamous cell carcinoma of skin 09/23/2019   right upper shin, lateral - CX3 + 5FU   Varicose veins    Varicose veins of left lower extremity    Past Surgical History:  Procedure Laterality Date   [place]   Fusion   CHOLECYSTECTOMY N/A 11/21/2018   Procedure: LAPAROSCOPIC CHOLECYSTECTOMY;  Surgeon: Virl Cagey, MD;  Location: [facility];  Service: General;  Laterality: N/A;   COLONOSCOPY N/A 06/07/2016   Procedure: COLONOSCOPY;  Surgeon: Rogene Houston, MD;  Location: [facility];  Service: Endoscopy;  Laterality: N/A;  1:00   KNEE ARTHROSCOPY  09/16/2012   Procedure: ARTHROSCOPY KNEE;  Surgeon: [name], MD;  Location: [place];  Service: Orthopedics;  Laterality: Right;  Right Knee Arthroscopy   LAPAROSCOPIC GASTRIC SLEEVE RESECTION N/A 08/17/2019   Procedure: LAPAROSCOPIC GASTRIC SLEEVE RESECTION AND HIATAL HERNIA REPAIR, UPPPER ENDO AND ERAS PATHWAY;  Surgeon: Johnathan Hausen, MD;  Location: [facility];  Service: General;  Laterality: N/A;   SALPINGOOPHORECTOMY Left 1985   SHOULDER ARTHROSCOPY WITH OPEN ROTATOR CUFF REPAIR AND DISTAL CLAVICLE ACROMINECTOMY Right 05/03/2016   Procedure: RIGHT SHOULDER ARTHROSCOPY WITH MINI-OPEN ROTATOR CUFF REPAIR,DISTAL CLAVICLE RESECTION, SUBACROMIAL DECOMPRESSION.;  Surgeon: [name], MD;  Location: [place];  Service: Orthopedics;  Laterality: Right;   SHOULDER ARTHROSCOPY  WITH ROTATOR CUFF REPAIR Right 09/25/2016   Procedure: Right Shoulder Manipulation, Arthroscopic Debridement of Joint, Removal of Loose Body and Re-Repair of Rotator Cuff;  Surgeon: [name], MD;  Location: [place];  Service: Orthopedics;  Laterality: Right;   Family History  Problem Relation Age of Onset   Heart disease Sister    Heart disease Brother        51's   Diabetes Mellitus II Sister    Multiple myeloma Mother    Hypertension Sister    Hypertension Father    Heart attack Father    Social History   Socioeconomic History   Marital status: Married    Spouse name: Not on file   Number of children: Not on file   Years of education: Not on file   Highest education level: Not on file  Occupational History   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  Tobacco Use   Smoking status: Former    Types: Cigarettes    Quit date: 09/22/1992    Years since quitting: 29.5   Smokeless tobacco: Never  Vaping Use   Vaping Use: Never used  Substance and Sexual Activity   Alcohol use: No    Alcohol/week: 0.0 standard drinks   Drug use: No   Sexual activity: Not Currently  Other Topics Concern   Not on file  Social History Narrative   Lives with husband in [place],  [place]   Social Determinants of Health   Financial Resource Strain: Low Risk    Difficulty of Paying Living Expenses: Not hard at all  Food Insecurity: No Food Insecurity   Worried About Charity fundraiser in the Last Year: Never true   [place] in the Last Year: Never true  Transportation Needs: No Transportation Needs   Lack of Transportation (Medical): No   Lack of Transportation (Non-Medical): No  Physical Activity: Insufficiently Active   Days of Exercise per Week: 5 days   Minutes of Exercise per Session: 20 min  Stress: No Stress Concern Present   Feeling of Stress : Not at all  Social Connections: Socially Integrated   Frequency of Communication with Friends and Family: More than three times a week   Frequency of Social Gatherings with Friends and Family: More than three times a week   Attends Religious Services: More than 4 times per year   Active Member of [employer] or Organizations: Yes   Attends Music therapist: More than 4 times per year   Marital Status: Married    Tobacco Counseling Counseling given: Not Answered   Clinical Intake:  Pre-visit preparation completed: Yes  Pain : 0-10 Pain Score: 3  Pain Type: Chronic pain Pain Location: Back Pain Descriptors / Indicators: Aching, Dull Pain Onset: More than a month ago Pain Frequency: Intermittent     BMI - recorded: 32.62 Nutritional Status: BMI > 30  Obese Nutritional Risks: None Diabetes: No  How often do you need to have someone help you when you read instructions, pamphlets, or other written materials from your doctor or pharmacy?: 1 - Never  Diabetic?NO  Interpreter Needed?: No  Information entered by :: mj Abdo Denault, lpn   Activities of Daily Living    04/03/2022    9:58 AM  In your present state of health, do you have any difficulty performing the following activities:  Hearing? 0  Vision? 0  Difficulty concentrating or making decisions? 0  Walking or climbing stairs? 0   Dressing or bathing? 0  Doing  errands, shopping? 0  Preparing Food and eating ? N  Using the Toilet? N  In the past six months, have you accidently leaked urine? N  Do you have problems with loss of bowel control? N  Managing your Medications? N  Managing your Finances? N  Housekeeping or managing your Housekeeping? N    Patient Care Team: Coral Spikes, DO as PCP - General (Family Medicine) Lavonna Monarch, MD as Consulting Physician (Dermatology) Warren Danes, PA-C as Physician Assistant (Dermatology)  Indicate any recent Medical Services you may have received from other than [facility] providers in the past year (date may be approximate).     Assessment:   This is a routine wellness examination for Jennifer Coleman.  Hearing/Vision screen Hearing Screening - Comments:: No hearing issues.  Vision Screening - Comments:: Glasses. Dr. Rosana Hoes in [place]. 01/2021.  Dietary issues and exercise activities discussed: Current Exercise Habits: Home exercise routine, Type of exercise: walking, Time (Minutes): 20, Frequency (Times/Week): 5, Weekly Exercise (Minutes/Week): 100, Intensity: Mild, Exercise limited by: cardiac condition(s);orthopedic condition(s)   Goals Addressed             This Visit's Progress    Exercise 3x per week (30 min per time)       Encouraged pt to increase as tolerated.       Depression Screen    04/03/2022    9:39 AM 04/27/2021    9:57 AM 03/01/2021    3:25 PM 11/11/2020    9:11 AM 11/13/2019    1:47 PM 11/13/2019    1:46 PM 01/04/2018   12:07 PM  PHQ 2/9 Scores  PHQ - 2 Score 0 0 0 0 0 0 0  PHQ- 9 Score   0        Fall Risk    04/03/2022    9:52 AM 04/27/2021    9:57 AM 03/01/2021    3:24 PM 08/04/2020    1:24 PM 11/11/2018    9:34 AM  Fall Risk   Falls in the past year? 1 0 0 0 1  Number falls in past yr: 0  0  0  Injury with Fall? 1  0  1  Risk for fall due to : History of fall(s);Impaired balance/gait No Fall Risks No Fall Risks    Follow up Falls prevention  discussed Falls evaluation completed Falls evaluation completed Falls evaluation completed     [place]:  Any stairs in or around the home? No  If so, are there any without handrails? No  Home free of loose throw rugs in walkways, pet beds, electrical cords, etc? Yes  Adequate lighting in your home to reduce risk of falls? Yes   ASSISTIVE DEVICES UTILIZED TO PREVENT FALLS:  Life alert? No  Use of a cane, walker or w/c? No  Grab bars in the bathroom? Yes  Shower chair or bench in shower? Yes  Elevated toilet seat or a handicapped toilet? Yes   TIMED UP AND GO:  Was the test performed? No .  Phone visit.   Cognitive Function:        04/03/2022    9:59 AM  6CIT Screen  What Year? 0 points  What month? 0 points  What time? 0 points  Count back from 20 0 points  Months in reverse 0 points  Repeat phrase 0 points  Total Score 0 points    Immunizations Immunization History  Administered Date(s) Administered   Influenza,inj,Quad PF,6+ Mos 07/30/2019  Influenza-Unspecified 08/05/2020, 08/25/2021   PFIZER(Purple Top)SARS-COV-2 Vaccination 12/11/2019, 01/08/2020, 10/27/2020   Pneumococcal Conjugate-13 04/27/2021   Pneumococcal Polysaccharide-23 08/18/2019   Tdap 03/01/2016   Zoster Recombinat (Shingrix) 08/25/2021, 02/15/2022    TDAP status: Up to date  Flu Vaccine status: Up to date  Pneumococcal vaccine status: Up to date  Covid-19 vaccine status: Completed vaccines  Qualifies for Shingles Vaccine? Yes   Zostavax completed Yes   Shingrix Completed?: Yes  Screening Tests Health Maintenance  Topic Date Due   COVID-19 Vaccine (4 - Booster for Pfizer series) 04/19/2022 (Originally 12/22/2020)   INFLUENZA VACCINE  05/29/2022   MAMMOGRAM  08/10/2023   TETANUS/TDAP  03/01/2026   COLONOSCOPY (Pts 45-97yr Insurance coverage will need to be confirmed)  06/07/2026   Pneumonia Vaccine 71 Years old  Completed   DEXA SCAN  Completed    Hepatitis C Screening  Completed   Zoster Vaccines- Shingrix  Completed   HPV VACCINES  Aged Out    Health Maintenance  There are no preventive care reminders to display for this patient.   Colorectal cancer screening: Type of screening: Colonoscopy. Completed 06/07/2016. Repeat every 10 years  Mammogram status: Completed 08/09/2021. Repeat every year  Bone Density status: Completed 11/25/2019. Results reflect: Bone density results: OSTEOPOROSIS. Repeat every 2 years.  Lung Cancer Screening: (Low Dose CT Chest recommended if Age 69-80years, 30 pack-year currently smoking OR have quit w/in 15years.) does not qualify.   Additional Screening:  Hepatitis C Screening: does qualify;  Vision Screening: Recommended annual ophthalmology exams for early detection of glaucoma and other disorders of the eye. Is the patient up to date with their annual eye exam?  Yes  Who is the provider or what is the name of the office in which the patient attends annual eye exams? Dr. DAntonietta BreachIf pt is not established with a provider, would they like to be referred to a provider to establish care? No .   Dental Screening: Recommended annual dental exams for proper oral hygiene  Community Resource Referral / Chronic Care Management: CRR required this visit?  No   CCM required this visit?  No      Plan:     I have personally reviewed and noted the following in the patient's chart:   Medical and social history Use of alcohol, tobacco or illicit drugs  Current medications and supplements including opioid prescriptions. Patient is not currently taking opioid prescriptions. Functional ability and status Nutritional status Physical activity Advanced directives List of other physicians Hospitalizations, surgeries, and ER visits in previous 12 months Vitals Screenings to include cognitive, depression, and falls Referrals and appointments  In addition, I have reviewed and discussed with patient  certain preventive protocols, quality metrics, and best practice recommendations. A written personalized care plan for preventive services as well as general preventive health recommendations were provided to patient.     MChriss Driver LPN   [id number]/01/[id number]  Nurse Notes: Pt is up to date on health maintenance and vaccines.

## 2022-04-23 ENCOUNTER — Ambulatory Visit: Payer: PPO | Admitting: Nurse Practitioner

## 2022-06-28 ENCOUNTER — Other Ambulatory Visit: Payer: Self-pay | Admitting: *Deleted

## 2022-06-28 DIAGNOSIS — R519 Headache, unspecified: Secondary | ICD-10-CM

## 2022-06-29 ENCOUNTER — Encounter: Payer: Self-pay | Admitting: Neurology

## 2022-07-11 DIAGNOSIS — R1011 Right upper quadrant pain: Secondary | ICD-10-CM | POA: Diagnosis not present

## 2022-07-17 ENCOUNTER — Other Ambulatory Visit: Payer: Self-pay | Admitting: Surgery

## 2022-07-17 ENCOUNTER — Other Ambulatory Visit (HOSPITAL_COMMUNITY): Payer: Self-pay | Admitting: Surgery

## 2022-07-17 DIAGNOSIS — R1011 Right upper quadrant pain: Secondary | ICD-10-CM

## 2022-07-18 ENCOUNTER — Other Ambulatory Visit (HOSPITAL_COMMUNITY): Payer: Self-pay | Admitting: Family Medicine

## 2022-07-18 DIAGNOSIS — R1011 Right upper quadrant pain: Secondary | ICD-10-CM | POA: Diagnosis not present

## 2022-07-18 DIAGNOSIS — Z1231 Encounter for screening mammogram for malignant neoplasm of breast: Secondary | ICD-10-CM

## 2022-07-19 ENCOUNTER — Ambulatory Visit (HOSPITAL_COMMUNITY)
Admission: RE | Admit: 2022-07-19 | Discharge: 2022-07-19 | Disposition: A | Discharge status: Home / Self Care | Payer: PPO | Source: Ambulatory Visit | Attending: Surgery | Admitting: Surgery

## 2022-07-19 DIAGNOSIS — R1011 Right upper quadrant pain: Secondary | ICD-10-CM | POA: Insufficient documentation

## 2022-07-19 DIAGNOSIS — N281 Cyst of kidney, acquired: Secondary | ICD-10-CM | POA: Diagnosis not present

## 2022-07-19 LAB — POCT I-STAT CREATININE: Creatinine, Ser: 1 mg/dL (ref 0.44–1.00)

## 2022-07-19 MED ORDER — IOHEXOL 9 MG/ML PO SOLN: 1000.0000 mL | Freq: Once | ORAL | Status: DC

## 2022-07-19 MED ORDER — IOHEXOL 300 MG/ML  SOLN
100.0000 mL | Freq: Once | INTRAMUSCULAR | Status: AC | PRN
  Administered 2022-07-19: 100 mL via INTRAVENOUS

## 2022-08-13 ENCOUNTER — Ambulatory Visit (HOSPITAL_COMMUNITY)
Admission: RE | Admit: 2022-08-13 | Discharge: 2022-08-13 | Disposition: A | Discharge status: Home / Self Care | Payer: PPO | Source: Ambulatory Visit | Attending: Family Medicine | Admitting: Family Medicine

## 2022-08-13 DIAGNOSIS — Z1231 Encounter for screening mammogram for malignant neoplasm of breast: Secondary | ICD-10-CM | POA: Diagnosis not present

## 2022-08-20 ENCOUNTER — Other Ambulatory Visit: Payer: Self-pay

## 2022-08-20 DIAGNOSIS — F418 Other specified anxiety disorders: Secondary | ICD-10-CM

## 2022-08-20 DIAGNOSIS — I10 Essential (primary) hypertension: Secondary | ICD-10-CM

## 2022-08-20 MED ORDER — ALENDRONATE SODIUM 70 MG PO TABS: ORAL_TABLET | ORAL | 1 refills | Status: DC

## 2022-08-20 MED ORDER — ENALAPRIL MALEATE 20 MG PO TABS: 20.0000 mg | ORAL_TABLET | Freq: Two times a day (BID) | ORAL | 1 refills | Status: DC

## 2022-08-20 MED ORDER — ESCITALOPRAM OXALATE 20 MG PO TABS: 20.0000 mg | ORAL_TABLET | Freq: Every day | ORAL | 1 refills | Status: DC

## 2022-09-03 DIAGNOSIS — M65312 Trigger thumb, left thumb: Secondary | ICD-10-CM | POA: Diagnosis not present

## 2022-09-03 DIAGNOSIS — M25561 Pain in right knee: Secondary | ICD-10-CM | POA: Diagnosis not present

## 2022-09-25 DIAGNOSIS — H35373 Puckering of macula, bilateral: Secondary | ICD-10-CM | POA: Diagnosis not present

## 2022-09-25 NOTE — Progress Notes (Unsigned)
NEUROLOGY CONSULTATION NOTE  Jennifer Coleman MRN: [id number] DOB: Aug 24, 1951  Referring provider: Thersa Salt, DO Primary care provider: Thersa Salt, DO  Reason for consult:  headaches  Assessment/Plan:   Chronic headaches - may be migraines triggered by COVID Lumbar spinal stenosis Frequent falls, suspect related to the spinal stenosis.  She reports falls often due to stubbing toes of right foot on ground which correlates with L5 nerve root where she has advanced spinal stenosis.  1  Start zonisamide 46m at bedtime for one day, then 562mat bedtime.  Contact me in 4-5 weeks with update and we can increase dose if needed 2  Take Tylenol for headaches if needed.  Limit use of pain relievers to no more than 2 days out of week to prevent risk of rebound or medication-overuse headache. 3.  Caffeine cessation 4.  Recommend physical therapy to help with gait.  She knows a physical therapist and will contact me if she needs a referral 5.  MRI of brain with and without contrast to evaluate for new daily headaches over age [age]nd for evaluation of other possible causes for frequent falls 6.  Follow up 4-5 months.  Subjective:  ToABRIAL ARRIGHIs a [age]ear old female with HTN, arthritis, chronic low back pain, PVCs, depression, anxiety and history of basal cell carcinoma and squamous cell carcinoma who presents for headaches.  History supplemented by referring provider's note.  Onset:  January 2022 after having COVID-19 Location:  mostly left sided but bilateral Quality:  throbbing Intensity:  severe.   Aura:  absent Prodrome:  absent Associated symptoms:  some nausea, exacerbation of her tinnitus, fatigue.  She denies associated visual disturbance, photophobia, phonophobia, unilateral numbness or weakness. Duration:  Usually 2 hours (may last 15 minutes to 2 days) Frequency:  Almost daily.  Sometimes twice in a day.   Frequency of abortive medication: ibuprofen daily (mostly for back  pain) Triggers:  unknown Relieving factors:  rest Activity:  aggravates  Eye exam was unremarkable.    Remote history of migraines in the 1980s related to birth control medication.  They were associated with nausea, photophobia and phonophobia.  More debilitating   She has history of frequent falls.  Typically, she is not able to completely left her right foot up, so her toe stubs the floor causing her to trip.  [place] hit head on 11/22/2021.  CT head personally reviewed was unremarkable.  CT C-spine personally reviewed showed occasional facet hypertrophy and C5-C6 vertebral body fusion with small disc space.  She has chronic low back pain.  MRI of lumbar spine on 01/30/2022 revealed degenerative spine disease with advanced spinal stenosis at L4-5 and right subarticular recess stenosis at L5-S1.    Past NSAIDS/analgesics:  tramadol Past abortive triptans:  none Past abortive ergotamine:  none Past muscle relaxants:  tizanidine, cylcobenzaprine, methocarbamol Past anti-emetic:  Zofran  Past antihypertensive medications:  amlodipine Past antidepressant medications:  none Past anticonvulsant medications:  Lyrica, gabapentin Past anti-CGRP:  none Past vitamins/Herbal/Supplements:  CoQ10, B complex Past antihistamines/decongestants:  Flonase Other past therapies:  none  Current NSAIDS/analgesics:  ibuprofen (usually for back) Voltaren gel Current triptans:  none Current ergotamine:  none Current anti-emetic:  none Current muscle relaxants:  none Current Antihypertensive medications:  enalapril, HCTZ Current Antidepressant medications:  escitalopram 2040murrent Anticonvulsant medications:  none Current anti-CGRP:  none Current Vitamins/Herbal/Supplements:  MVI Current Antihistamines/Decongestants:  Zyrtec, Flonase Other therapy:  none    Caffeine:  12 oz  coffee every morning.  Occasional tea Diet:  drinks water.  No soda Depression:  yes; Anxiety:  yes Other pain:  chronic low  back pain Sleep hygiene:  okay       PAST MEDICAL HISTORY: Past Medical History:  Diagnosis Date   Anxiety    Arthritis    Basal cell carcinoma 02/02/2016   Left auricle - MOHs   Dysrhythmia    PVCs   Family history of anesthesia complication 25 yrs ago   Sister allergic to sccinocholine - unable to come off ventilator   Hypertension    Pneumonia    PONV (postoperative nausea and vomiting)    Premature ventricular contractions    SCC (squamous cell carcinoma) 05/09/2021   in situ- left anterior neck   SCC (squamous cell carcinoma) 05/09/2021   in situ- left upper arm-posterior (EXC)   SCC (squamous cell carcinoma) 05/09/2021   in situ- right inner lower leg   Shingles    Squamous cell carcinoma of skin 11/14/2011   right cheek - CX3 + 5FU   Squamous cell carcinoma of skin 11/14/2011   right canthus, superior - CX3 + 5FU   Squamous cell carcinoma of skin 11/14/2011   left cheek - CX3 + 5FU   Squamous cell carcinoma of skin 07/26/2015   right forearm - CX3 + 5FU   Squamous cell carcinoma of skin 02/02/2016   left tip shoulder - CX3 + cautery + 5FU    Squamous cell carcinoma of skin 07/17/2016   left post shoulder - tx after biopsy   Squamous cell carcinoma of skin 07/17/2016   right low shin - tx after biopsy   Squamous cell carcinoma of skin 03/13/2019   left post shoulder - tx after biopsy   Squamous cell carcinoma of skin 03/13/2019   right inner ankle - tx after biopsy   Squamous cell carcinoma of skin 09/23/2019   left cheek - tx after biopsy + MOHs (Dr. Winifred Olive)   Squamous cell carcinoma of skin 09/23/2019   right upper shin, medial - CX3 + 5FU   Squamous cell carcinoma of skin 09/23/2019   right upper shin, lateral - CX3 + 5FU   Varicose veins    Varicose veins of left lower extremity     PAST SURGICAL HISTORY: Past Surgical History:  Procedure Laterality Date   CARPAL TUNNEL RELEASE     CERVICAL [place]   Fusion   CHOLECYSTECTOMY N/A  11/21/2018   Procedure: LAPAROSCOPIC CHOLECYSTECTOMY;  Surgeon: Virl Cagey, MD;  Location: [facility];  Service: General;  Laterality: N/A;   COLONOSCOPY N/A 06/07/2016   Procedure: COLONOSCOPY;  Surgeon: Rogene Houston, MD;  Location: [facility];  Service: Endoscopy;  Laterality: N/A;  1:00   KNEE ARTHROSCOPY  09/16/2012   Procedure: ARTHROSCOPY KNEE;  Surgeon: [name], MD;  Location: [facility];  Service: Orthopedics;  Laterality: Right;  Right Knee Arthroscopy   LAPAROSCOPIC GASTRIC SLEEVE RESECTION N/A 08/17/2019   Procedure: LAPAROSCOPIC GASTRIC SLEEVE RESECTION AND HIATAL HERNIA REPAIR, UPPPER ENDO AND ERAS PATHWAY;  Surgeon: Johnathan Hausen, MD;  Location: [facility];  Service: General;  Laterality: N/A;   SALPINGOOPHORECTOMY Left 1985   SHOULDER ARTHROSCOPY WITH OPEN ROTATOR CUFF REPAIR AND DISTAL CLAVICLE ACROMINECTOMY Right 05/03/2016   Procedure: RIGHT SHOULDER ARTHROSCOPY WITH MINI-OPEN ROTATOR CUFF REPAIR,DISTAL CLAVICLE RESECTION, SUBACROMIAL DECOMPRESSION.;  Surgeon: [name], MD;  Location: [facility];  Service: Orthopedics;  Laterality: Right;   SHOULDER ARTHROSCOPY  WITH ROTATOR CUFF REPAIR Right 09/25/2016   Procedure: Right Shoulder Manipulation, Arthroscopic Debridement of Joint, Removal of Loose Body and Re-Repair of Rotator Cuff;  Surgeon: [name], MD;  Location: [facility];  Service: Orthopedics;  Laterality: Right;    MEDICATIONS: Current Outpatient Medications on File Prior to Visit  Medication Sig Dispense Refill   alendronate (FOSAMAX) 70 MG tablet TAKE ONE TABLET BY MOUTH ONCE A WEEK IN THE MORNING WITH A FULL GLASS OF WATER ON AN EMPTY STOMACH. DO NOT LAY DOWN FOR 30 MINUTES. 12 tablet 1   cetirizine (ZYRTEC) 10 MG tablet Take 10 mg by mouth daily.     diclofenac sodium (VOLTAREN) 1 % GEL APPLY TWO GRAMS TO SHOULDER UP TO FOUR TIMES DAILY (Patient taking differently: Apply 2 g topically daily as needed (pain). Knee) 100 g 2    enalapril (VASOTEC) 20 MG tablet Take 1 tablet (20 mg total) by mouth 2 (two) times daily. 180 tablet 1   escitalopram (LEXAPRO) 20 MG tablet Take 1 tablet (20 mg total) by mouth daily. 90 tablet 1   fluticasone (FLONASE) 50 MCG/ACT nasal spray PLACE 2 SPRAYS INTO BOTH NOSTRILS DAILY. (Patient taking differently: Place 2 sprays into both nostrils daily as needed for allergies.) 16 g 0   hydrochlorothiazide (HYDRODIURIL) 25 MG tablet Take 1 tablet (25 mg total) by mouth as needed (Fluid). 90 tablet 1   Multiple Vitamins-Minerals (ONE-A-DAY WOMENS 50 PLUS PO) Take 1 tablet by mouth daily.     PROAIR HFA 108 (90 Base) MCG/ACT inhaler INHALE TWO PUFFS BY MOUTH EVERY 4 TO 6 HOURS AS NEEDED FOR FOR WHEEZING 1 each 0   valACYclovir (VALTREX) 1000 MG tablet valacyclovir 1 gram tablet  TAKE ONE TABLET BY MOUTH TWICE DAILY FOR 10 DAYS     No current facility-administered medications on file prior to visit.     ALLERGIES: Allergies  Allergen Reactions   Penicillins Shortness Of Breath, Swelling, Rash and Other (See Comments)    Did it involve swelling of the face/tongue/throat, SOB, or low BP? Yes Did it involve sudden or severe rash/hives, skin peeling, or any reaction on the inside of your mouth or nose? Yes Did you need to seek medical attention at a hospital or doctor's office? Yes-DR. office When did it last happen?  Over 10 years If all above answers are "NO", may proceed with cephalosporin use. Patient tolerates Cephalosporins    Shellfish Allergy Shortness Of Breath and Swelling   Gabapentin Other (See Comments)    Didn't like the way the medication made her feel per pt.    Levaquin [Levofloxacin] Other (See Comments)    Made her feel confused    Lyrica [Pregabalin]     Pt states, the medication made the shingles outbreak, more intense. Pt was taking for shingles pain.   Codeine Itching   Morphine And Related Nausea And Vomiting    FAMILY HISTORY: Family History  Problem Relation  Age of Onset   Heart disease Sister    Heart disease Brother        75's   Diabetes Mellitus II Sister    Multiple myeloma Mother    Hypertension Sister    Hypertension Father    Heart attack Father     Objective:  Blood pressure 123/66, pulse (!) 55, height _0  (1.651 m), weight 210 lb 9.6 oz (95.5 kg), SpO2 97 %. General: No acute distress.  Patient appears well-groomed.   Head:  Normocephalic/atraumatic Eyes:  fundi examined  but not visualized Neck: supple, no paraspinal tenderness, full range of motion Back: No paraspinal tenderness Heart: regular rate and rhythm Lungs: Clear to auscultation bilaterally. Vascular: No carotid bruits. Neurological Exam: Mental status: alert and oriented to person, place, and time, speech fluent and not dysarthric, language intact. Cranial nerves: CN I: not tested CN II: pupils equal, round and reactive to light, visual fields intact CN III, IV, VI:  full range of motion, no nystagmus, no ptosis CN V: facial sensation intact. CN VII: upper and lower face symmetric CN VIII: hearing intact CN IX, X: gag intact, uvula midline CN XI: sternocleidomastoid and trapezius muscles intact CN XII: tongue midline Bulk & Tone: normal, no fasciculations. Motor:  muscle strength 5/5 throughout Sensation:  Pinprick sensation intact, vibratory sensation reduced in feet. Deep Tendon Reflexes:  2+ throughout,  toes downgoing.   Finger to nose testing:  Without dysmetria.   Heel to shin:  Without dysmetria.   Gait:  Normal station and stride.  Romberg negative.    Thank you for allowing me to take part in the care of this patient.  Metta Clines, DO  CC: Thersa Salt, DO

## 2022-09-27 ENCOUNTER — Ambulatory Visit (INDEPENDENT_AMBULATORY_CARE_PROVIDER_SITE_OTHER): Payer: PPO | Admitting: Neurology

## 2022-09-27 ENCOUNTER — Encounter: Payer: Self-pay | Admitting: Neurology

## 2022-09-27 VITALS — BP 123/66 | HR 55 | Ht 65.0 in | Wt 210.6 lb

## 2022-09-27 DIAGNOSIS — R519 Headache, unspecified: Secondary | ICD-10-CM

## 2022-09-27 DIAGNOSIS — R296 Repeated falls: Secondary | ICD-10-CM | POA: Diagnosis not present

## 2022-09-27 DIAGNOSIS — M48061 Spinal stenosis, lumbar region without neurogenic claudication: Secondary | ICD-10-CM

## 2022-09-27 MED ORDER — ZONISAMIDE 25 MG PO CAPS: ORAL_CAPSULE | ORAL | 0 refills | Status: DC

## 2022-09-27 NOTE — Patient Instructions (Addendum)
Start zonisamide as instructed.  When time for refill, contact me with update Keep headache diary Limit use of pain relievers to no more than 2 days out of week to prevent risk of rebound or medication-overuse headache. Take Tylenol for headache You need physical therapy to help with walking - let me know if you need a referral Follow up 4 to 5 months.

## 2022-10-03 DIAGNOSIS — M1711 Unilateral primary osteoarthritis, right knee: Secondary | ICD-10-CM | POA: Diagnosis not present

## 2022-10-03 DIAGNOSIS — M25562 Pain in left knee: Secondary | ICD-10-CM | POA: Diagnosis not present

## 2022-10-03 DIAGNOSIS — M25561 Pain in right knee: Secondary | ICD-10-CM | POA: Diagnosis not present

## 2022-10-28 ENCOUNTER — Encounter: Payer: Self-pay | Admitting: Neurology

## 2022-11-14 ENCOUNTER — Other Ambulatory Visit: Payer: Self-pay | Admitting: Family Medicine

## 2022-11-14 DIAGNOSIS — I10 Essential (primary) hypertension: Secondary | ICD-10-CM

## 2022-11-14 DIAGNOSIS — F418 Other specified anxiety disorders: Secondary | ICD-10-CM

## 2022-11-15 ENCOUNTER — Other Ambulatory Visit: Payer: Self-pay

## 2022-11-15 DIAGNOSIS — R519 Headache, unspecified: Secondary | ICD-10-CM

## 2022-11-20 ENCOUNTER — Other Ambulatory Visit: Payer: Self-pay | Admitting: Family Medicine

## 2022-12-16 ENCOUNTER — Ambulatory Visit
Admission: RE | Admit: 2022-12-16 | Discharge: 2022-12-16 | Disposition: A | Discharge status: Home / Self Care | Payer: PPO | Source: Ambulatory Visit | Attending: Neurology | Admitting: Neurology

## 2022-12-16 DIAGNOSIS — R519 Headache, unspecified: Secondary | ICD-10-CM | POA: Diagnosis not present

## 2022-12-16 MED ORDER — GADOPICLENOL 0.5 MMOL/ML IV SOLN
10.0000 mL | Freq: Once | INTRAVENOUS | Status: AC | PRN
  Administered 2022-12-16: 10 mL via INTRAVENOUS

## 2023-01-15 ENCOUNTER — Ambulatory Visit (INDEPENDENT_AMBULATORY_CARE_PROVIDER_SITE_OTHER): Payer: PPO | Admitting: Family Medicine

## 2023-01-15 ENCOUNTER — Other Ambulatory Visit (HOSPITAL_COMMUNITY): Payer: Self-pay

## 2023-01-15 VITALS — BP 130/76 | HR 69 | Temp 97.0°F | Ht 65.0 in | Wt 209.0 lb

## 2023-01-15 DIAGNOSIS — E785 Hyperlipidemia, unspecified: Secondary | ICD-10-CM | POA: Diagnosis not present

## 2023-01-15 DIAGNOSIS — Z130 Encounter for screening for diseases of the blood and blood-forming organs and certain disorders involving the immune mechanism: Secondary | ICD-10-CM

## 2023-01-15 DIAGNOSIS — I10 Essential (primary) hypertension: Secondary | ICD-10-CM

## 2023-01-15 DIAGNOSIS — J029 Acute pharyngitis, unspecified: Secondary | ICD-10-CM | POA: Diagnosis not present

## 2023-01-15 DIAGNOSIS — M810 Age-related osteoporosis without current pathological fracture: Secondary | ICD-10-CM | POA: Diagnosis not present

## 2023-01-15 DIAGNOSIS — F418 Other specified anxiety disorders: Secondary | ICD-10-CM | POA: Diagnosis not present

## 2023-01-15 MED ORDER — ESCITALOPRAM OXALATE 20 MG PO TABS
20.0000 mg | ORAL_TABLET | Freq: Every day | ORAL | 3 refills | Status: DC
  Filled 2023-01-15: qty 90, 90d supply, fill #0

## 2023-01-15 MED ORDER — ALENDRONATE SODIUM 70 MG PO TABS
70.0000 mg | ORAL_TABLET | ORAL | 3 refills | Status: DC
  Filled 2023-01-15: qty 12, 84d supply, fill #0

## 2023-01-15 MED ORDER — ENALAPRIL MALEATE 20 MG PO TABS
20.0000 mg | ORAL_TABLET | Freq: Two times a day (BID) | ORAL | 3 refills | Status: DC
  Filled 2023-01-15: qty 180, 90d supply, fill #0

## 2023-01-15 MED ORDER — HYDROCHLOROTHIAZIDE 25 MG PO TABS
25.0000 mg | ORAL_TABLET | ORAL | 3 refills | Status: DC | PRN
  Filled 2023-01-15: qty 90, 90d supply, fill #0

## 2023-01-15 MED ORDER — FLUTICASONE PROPIONATE 50 MCG/ACT NA SUSP
2.0000 | Freq: Every day | NASAL | 0 refills | Status: DC
  Filled 2023-01-15: qty 16, 30d supply, fill #0

## 2023-01-15 MED ORDER — CETIRIZINE HCL 10 MG PO TABS
10.0000 mg | ORAL_TABLET | Freq: Every day | ORAL | 3 refills | Status: AC
  Filled 2023-01-15: qty 90, 90d supply, fill #0

## 2023-01-15 NOTE — Assessment & Plan Note (Signed)
Lipid panel today to assess. 

## 2023-01-15 NOTE — Progress Notes (Signed)
Subjective:  Patient ID: Jennifer Coleman, female    DOB: 1950/12/26  Age: 72 y.o. MRN: [id number]  CC: Chief Complaint  Patient presents with   Hypertension   Back Pain    Working outdoors in yard    HPI:  72 year old female presents for follow-up.  Patient is now being followed by neurology regarding her frequent headaches.  She has seen orthopedics regarding her back pain.  Hypertension is stable on enalapril and HCTZ.  Patient reports that she started having respiratory symptoms yesterday.  She reports sore throat and ear pain.  No fever.  Patient's hyperlipidemia is untreated.  Needs labs today.  Continues on Fosamax regarding osteoporosis.  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  Tobacco Use   Smoking status: Former    Types: Cigarettes    Quit date: 09/22/1992    Years since quitting: 30.3   Smokeless tobacco: Never  Vaping Use   Vaping Use: Never used  Substance and Sexual Activity   Alcohol use: No    Alcohol/week: 0.0 standard drinks of alcohol   Drug use: No   Sexual activity: Not Currently  Other Topics Concern   Not on file  Social History Narrative   Lives with husband in [place], [place]   Right handed   Social Determinants of Health   Financial Resource Strain: Low Risk  (04/03/2022)   Overall Financial Resource Strain (CARDIA)    Difficulty of Paying Living Expenses: Not  hard at all  Food Insecurity: No Food Insecurity (04/03/2022)   Hunger Vital Sign    Worried About Running Out of Food in the Last Year: Never true    Ran Out of Food in the Last Year: Never true  Transportation Needs: No Transportation Needs (04/03/2022)   PRAPARE - Hydrologist (Medical): No    Lack of Transportation (Non-Medical): No  Physical Activity: Insufficiently Active (04/03/2022)   Exercise Vital Sign    Days of Exercise per Week: 5 days    Minutes of Exercise per Session: 20 min  Stress: No Stress Concern Present (04/03/2022)   [place]    Feeling of Stress : Not at all  Social Connections: [place] (04/03/2022)   Social Connection and Isolation Panel [NHANES]    Frequency of Communication with Friends and Family: More than three times a week    Frequency of Social Gatherings with Friends and Family: More than three times a week    Attends Religious Services: More than 4 times per year    Active Member of [employer] or Organizations: Yes    Attends Music therapist: More than 4 times per year    Marital Status: Married    Review of Systems Per HPI  Objective:  BP 130/76   Pulse 69   Temp (!) 97  F (36.1 C)   Ht 5\' 5"  (1.651 m)   Wt 209 lb (94.8 kg)   SpO2 97%   BMI 34.78 kg/m      01/15/2023   10:39 AM 09/27/2022    8:20 AM 04/03/2022    9:31 AM  BP/Weight  Systolic BP [id number]   Diastolic BP 76 66   Wt. (Lbs) 209 210.6 196  BMI 34.78 kg/m2 35.05 kg/m2 32.62 kg/m2    Physical Exam Vitals and nursing note reviewed.  Constitutional:      General: She is not in acute distress.    Appearance: Normal appearance.  HENT:     Head: Normocephalic and atraumatic.     Right Ear: Tympanic membrane normal.     Left Ear: Tympanic membrane normal.     Mouth/Throat:     Pharynx: Oropharynx is clear.  Eyes:     General:        Right eye: No discharge.         Left eye: No discharge.     Conjunctiva/sclera: Conjunctivae normal.  Cardiovascular:     Rate and Rhythm: Normal rate and regular rhythm.  Pulmonary:     Effort: Pulmonary effort is normal.     Breath sounds: Normal breath sounds. No wheezing, rhonchi or rales.  Neurological:     Mental Status: She is alert.  Psychiatric:        Mood and Affect: Mood normal.        Behavior: Behavior normal.     Lab Results  Component Value Date   WBC 6.4 11/22/2021   HGB 13.4 11/22/2021   HCT 40.1 11/22/2021   PLT 243 11/22/2021   GLUCOSE 83 11/22/2021   CHOL 211 (H) 11/22/2021   TRIG 82 11/22/2021   HDL 92 11/22/2021   LDLCALC 105 (H) 11/22/2021   ALT 15 11/22/2021   AST 20 11/22/2021   NA 143 11/22/2021   K 4.3 11/22/2021   CL 106 11/22/2021   CREATININE 1.00 07/19/2022   BUN 20 11/22/2021   CO2 26 11/22/2021   TSH 2.630 11/08/2017   INR 1.08 11/23/2018     Assessment & Plan:   Problem List Items Addressed This Visit       Cardiovascular and Mediastinum   Essential hypertension - Primary    At goal.  Continue enalapril and HCTZ.      Relevant Medications   enalapril (VASOTEC) 20 MG tablet   hydrochlorothiazide (HYDRODIURIL) 25 MG tablet   Other Relevant Orders   CMP14+EGFR     Respiratory   Pharyngitis    Exam unremarkable today.  Suspected viral cause.  Patient to let me know if symptoms fail to improve or worsen.        Musculoskeletal and Integument   Osteoporosis    Continue Fosamax.      Relevant Medications   alendronate (FOSAMAX) 70 MG tablet     Other   Depression with anxiety    Lexapro refilled.      Relevant Medications   escitalopram (LEXAPRO) 20 MG tablet   Hyperlipidemia    Lipid panel today to assess.      Relevant Medications   enalapril (VASOTEC) 20 MG tablet   hydrochlorothiazide (HYDRODIURIL) 25 MG tablet   Other Relevant Orders   Lipid panel   Other Visit Diagnoses     Screening for deficiency anemia       Relevant  Orders   CBC       Meds ordered  this encounter  Medications   alendronate (FOSAMAX) 70 MG tablet    Sig: Take 1 tablet (70 mg total) by mouth once a week with a full glass of water on an empty stomach.    Dispense:  12 tablet    Refill:  3   enalapril (VASOTEC) 20 MG tablet    Sig: Take 1 tablet (20 mg total) by mouth 2 (two) times daily.    Dispense:  180 tablet    Refill:  3    This prescription was filled on 11/14/2022. Any refills authorized will be placed on file.   escitalopram (LEXAPRO) 20 MG tablet    Sig: Take 1 tablet (20 mg total) by mouth daily.    Dispense:  90 tablet    Refill:  3    This prescription was filled on 11/14/2022. Any refills authorized will be placed on file.   fluticasone (FLONASE) 50 MCG/ACT nasal spray    Sig: Place 2 sprays into both nostrils daily.    Dispense:  16 g    Refill:  0   cetirizine (ZYRTEC) 10 MG tablet    Sig: Take 1 tablet (10 mg total) by mouth daily.    Dispense:  90 tablet    Refill:  3   hydrochlorothiazide (HYDRODIURIL) 25 MG tablet    Sig: Take 1 tablet (25 mg total) by mouth as needed (Fluid).    Dispense:  90 tablet    Refill:  3    Follow-up:  Return in about 6 months (around 07/18/2023).  [place]

## 2023-01-15 NOTE — Patient Instructions (Signed)
Continue your medications.    Follow up in 6 months

## 2023-01-15 NOTE — Assessment & Plan Note (Signed)
At goal.  Continue enalapril and HCTZ. 

## 2023-01-15 NOTE — Assessment & Plan Note (Signed)
Exam unremarkable today.  Suspected viral cause.  Patient to let me know if symptoms fail to improve or worsen.

## 2023-01-15 NOTE — Assessment & Plan Note (Signed)
Lexapro refilled 

## 2023-01-15 NOTE — Assessment & Plan Note (Signed)
Continue Fosamax.  

## 2023-01-16 LAB — LIPID PANEL
Chol/HDL Ratio: 2.4 ratio (ref 0.0–4.4)
Cholesterol, Total: 257 mg/dL — ABNORMAL HIGH (ref 100–199)
HDL: 108 mg/dL (ref >39)
LDL Chol Calc (NIH): 138 mg/dL — ABNORMAL HIGH (ref 0–99)
Triglycerides: 69 mg/dL (ref 0–149)
VLDL Cholesterol Cal: 11 mg/dL (ref 5–40)

## 2023-01-16 LAB — CBC
Hematocrit: 42.4 % (ref 34.0–46.6)
Hemoglobin: 13.9 g/dL (ref 11.1–15.9)
MCH: 32 pg (ref 26.6–33.0)
MCHC: 32.8 g/dL (ref 31.5–35.7)
MCV: 98 fL — ABNORMAL HIGH (ref 79–97)
Platelets: 254 10*3/uL (ref 150–450)
RBC: 4.35 x10E6/uL (ref 3.77–5.28)
RDW: 11.7 % (ref 11.7–15.4)
WBC: 8.5 10*3/uL (ref 3.4–10.8)

## 2023-01-16 LAB — CMP14+EGFR
ALT: 13 IU/L (ref 0–32)
AST: 20 IU/L (ref 0–40)
Albumin/Globulin Ratio: 1.7 (ref 1.2–2.2)
Albumin: 4 g/dL (ref 3.9–4.9)
Alkaline Phosphatase: 76 IU/L (ref 44–121)
BUN/Creatinine Ratio: 28 (ref 12–28)
BUN: 21 mg/dL (ref 8–27)
Bilirubin Total: 0.3 mg/dL (ref 0.0–1.2)
CO2: 25 mmol/L (ref 20–29)
Calcium: 10.2 mg/dL (ref 8.7–10.3)
Chloride: 105 mmol/L (ref 96–106)
Creatinine, Ser: 0.76 mg/dL (ref 0.57–1.00)
Globulin, Total: 2.3 g/dL (ref 1.5–4.5)
Glucose: 84 mg/dL (ref 70–99)
Potassium: 4.6 mmol/L (ref 3.5–5.2)
Sodium: 143 mmol/L (ref 134–144)
Total Protein: 6.3 g/dL (ref 6.0–8.5)
eGFR: 85 mL/min/{1.73_m2} (ref >59)

## 2023-01-22 ENCOUNTER — Encounter: Payer: Self-pay | Admitting: *Deleted

## 2023-01-29 ENCOUNTER — Other Ambulatory Visit (HOSPITAL_COMMUNITY): Payer: Self-pay

## 2023-01-29 NOTE — Progress Notes (Deleted)
NEUROLOGY FOLLOW UP OFFICE NOTE  Jennifer Coleman ME:[id number]  Assessment/Plan:   Chronic headaches - may be migraines triggered by COVID *** Lumbar spinal stenosis Frequent falls, suspect related to the spinal stenosis.  She reports falls often due to stubbing toes of right foot on ground which correlates with L5 nerve root where she has advanced spinal stenosis.   1  Headache prevention:  zonisamide 50mg  daily *** 2  Headache rescue:  Tylenol.  Limit use of pain relievers to no more than 2 days out of week to prevent risk of rebound or medication-overuse headache.*** 3.  Take ASA 81mg  daily due to evidence of cerebrovascular disease on MRI 6.  Follow up ***   Subjective:  Jennifer Coleman is a 72 year old female with HTN, arthritis, chronic low back pain, PVCs, depression, anxiety and history of basal cell carcinoma and squamous cell carcinoma who follows up for headaches.   UPDATE: Started on zonisamide. Intensity:  *** Duration:  *** Frequency:  *** Frequency of abortive medication: ***  MRI of brain with and without contrast on 12/16/2022 personally reviewed showed chronic lacunar infarct within the right thalamus but otherwise unremarkable.    Current NSAIDS/analgesics:  Tylenol, ibuprofen (usually for back) Voltaren gel Current triptans:  none Current ergotamine:  none Current anti-emetic:  none Current muscle relaxants:  none Current Antihypertensive medications:  enalapril, HCTZ Current Antidepressant medications:  escitalopram 20mg  Current Anticonvulsant medications:  zonisamide 50mg  daily Current anti-CGRP:  none Current Vitamins/Herbal/Supplements:  MVI Current Antihistamines/Decongestants:  Zyrtec, Flonase Other therapy:  none     Caffeine:  12 oz coffee every morning.  Occasional tea Diet:  drinks water.  No soda Depression:  yes; Anxiety:  yes Other pain:  chronic low back pain Sleep hygiene:  okay  HISTORY:  Onset:  January 2022 after having  COVID-19 Location:  mostly left sided but bilateral Quality:  throbbing Intensity:  severe.   Aura:  absent Prodrome:  absent Associated symptoms:  some nausea, exacerbation of her tinnitus, fatigue.  She denies associated visual disturbance, photophobia, phonophobia, unilateral numbness or weakness. Duration:  Usually 2 hours (may last 15 minutes to 2 days) Frequency:  Almost daily.  Sometimes twice in a day.   Frequency of abortive medication: ibuprofen daily (mostly for back pain) Triggers:  unknown Relieving factors:  rest Activity:  aggravates   Eye exam was unremarkable.     Remote history of migraines in the 1980s related to birth control medication.  They were associated with nausea, photophobia and phonophobia.  More debilitating    She has history of frequent falls.  Typically, she is not able to completely left her right foot up, so her toe stubs the floor causing her to trip.  [address] and hit head on 11/22/2021.  CT head personally reviewed was unremarkable.  CT C-spine personally reviewed showed occasional facet hypertrophy and C5-C6 vertebral body fusion with small disc space.  She has chronic low back pain.  MRI of lumbar spine on 01/30/2022 revealed degenerative spine disease with advanced spinal stenosis at L4-5 and right subarticular recess stenosis at L5-S1.     Past NSAIDS/analgesics:  tramadol Past abortive triptans:  none Past abortive ergotamine:  none Past muscle relaxants:  tizanidine, cylcobenzaprine, methocarbamol Past anti-emetic:  Zofran  Past antihypertensive medications:  amlodipine Past antidepressant medications:  none Past anticonvulsant medications:  Lyrica, gabapentin Past anti-CGRP:  none Past vitamins/Herbal/Supplements:  CoQ10, B complex Past antihistamines/decongestants:  Flonase Other past therapies:  none  PAST MEDICAL HISTORY: Past Medical History:  Diagnosis Date   Anxiety    Arthritis    Basal cell carcinoma 02/02/2016   Left auricle  - MOHs   Dysrhythmia    PVCs   Family history of anesthesia complication 25 yrs ago   Sister allergic to sccinocholine - unable to come off ventilator   Hypertension    Lumbar herniated disc    Pneumonia    PONV (postoperative nausea and vomiting)    Premature ventricular contractions    SCC (squamous cell carcinoma) 05/09/2021   in situ- left anterior neck   SCC (squamous cell carcinoma) 05/09/2021   in situ- left upper arm-posterior (EXC)   SCC (squamous cell carcinoma) 05/09/2021   in situ- right inner lower leg   Scoliosis    Shingles    Squamous cell carcinoma of skin 11/14/2011   right cheek - CX3 + 5FU   Squamous cell carcinoma of skin 11/14/2011   right canthus, superior - CX3 + 5FU   Squamous cell carcinoma of skin 11/14/2011   left cheek - CX3 + 5FU   Squamous cell carcinoma of skin 07/26/2015   right forearm - CX3 + 5FU   Squamous cell carcinoma of skin 02/02/2016   left tip shoulder - CX3 + cautery + 5FU    Squamous cell carcinoma of skin 07/17/2016   left post shoulder - tx after biopsy   Squamous cell carcinoma of skin 07/17/2016   right low shin - tx after biopsy   Squamous cell carcinoma of skin 03/13/2019   left post shoulder - tx after biopsy   Squamous cell carcinoma of skin 03/13/2019   right inner ankle - tx after biopsy   Squamous cell carcinoma of skin 09/23/2019   left cheek - tx after biopsy + MOHs (Dr. Winifred Olive)   Squamous cell carcinoma of skin 09/23/2019   right upper shin, medial - CX3 + 5FU   Squamous cell carcinoma of skin 09/23/2019   right upper shin, lateral - CX3 + 5FU   Varicose veins    Varicose veins of left lower extremity     MEDICATIONS: Current Outpatient Medications on File Prior to Visit  Medication Sig Dispense Refill   alendronate (FOSAMAX) 70 MG tablet Take 1 tablet (70 mg total) by mouth once a week with a full glass of water on an empty stomach. 12 tablet 3   cetirizine (ZYRTEC) 10 MG tablet Take 1 tablet (10 mg total)  by mouth daily. 90 tablet 3   diclofenac sodium (VOLTAREN) 1 % GEL APPLY TWO GRAMS TO SHOULDER UP TO FOUR TIMES DAILY (Patient taking differently: Apply 2 g topically daily as needed (pain). Knee) 100 g 2   enalapril (VASOTEC) 20 MG tablet Take 1 tablet (20 mg total) by mouth 2 (two) times daily. 180 tablet 3   escitalopram (LEXAPRO) 20 MG tablet Take 1 tablet (20 mg total) by mouth daily. 90 tablet 3   fluticasone (FLONASE) 50 MCG/ACT nasal spray Place 2 sprays into both nostrils daily. 16 g 0   hydrochlorothiazide (HYDRODIURIL) 25 MG tablet Take 1 tablet (25 mg total) by mouth as needed (Fluid). 90 tablet 3   Multiple Vitamins-Minerals (ONE-A-DAY WOMENS 50 PLUS PO) Take 1 tablet by mouth daily.     PROAIR HFA 108 (90 Base) MCG/ACT inhaler INHALE TWO PUFFS BY MOUTH EVERY 4 TO 6 HOURS AS NEEDED FOR FOR WHEEZING 1 each 0   No current facility-administered medications on file prior to visit.  ALLERGIES: Allergies  Allergen Reactions   Penicillins Shortness Of Breath, Swelling, Rash and Other (See Comments)    Did it involve swelling of the face/tongue/throat, SOB, or low BP? Yes Did it involve sudden or severe rash/hives, skin peeling, or any reaction on the inside of your mouth or nose? Yes Did you need to seek medical attention at a hospital or doctor's office? Yes-DR. office When did it last happen?  Over 10 years If all above answers are "NO", may proceed with cephalosporin use. Patient tolerates Cephalosporins    Shellfish Allergy Shortness Of Breath and Swelling   Gabapentin Other (See Comments)    Didn't like the way the medication made her feel per pt.    Levaquin [Levofloxacin] Other (See Comments)    Made her feel confused    Lyrica [Pregabalin]     Pt states, the medication made the shingles outbreak, more intense. Pt was taking for shingles pain.   Codeine Itching   Morphine And Related Nausea And Vomiting    FAMILY HISTORY: Family History  Problem Relation Age of  Onset   Heart disease Sister    Heart disease Brother        16's   Diabetes Mellitus II Sister    Multiple myeloma Mother    Hypertension Sister    Hypertension Father    Heart attack Father       Objective:  *** General: No acute distress.  Patient appears ***-groomed.   Head:  Normocephalic/atraumatic Eyes:  Fundi examined but not visualized Neck: supple, no paraspinal tenderness, full range of motion Heart:  Regular rate and rhythm Lungs:  Clear to auscultation bilaterally Back: No paraspinal tenderness Neurological Exam: alert and oriented to person, place, and time.  Speech fluent and not dysarthric, language intact.  CN II-XII intact. Bulk and tone normal, muscle strength 5/5 throughout.  Sensation to light touch intact.  Deep tendon reflexes 2+ throughout, toes downgoing.  Finger to nose testing intact.  Gait normal, Romberg negative.   Metta Clines, DO  CC: ***

## 2023-01-30 ENCOUNTER — Other Ambulatory Visit: Payer: Self-pay | Admitting: Family Medicine

## 2023-01-30 ENCOUNTER — Encounter: Payer: Self-pay | Admitting: Neurology

## 2023-01-30 ENCOUNTER — Ambulatory Visit: Payer: PPO | Admitting: Neurology

## 2023-01-30 DIAGNOSIS — Z029 Encounter for administrative examinations, unspecified: Secondary | ICD-10-CM

## 2023-01-30 MED ORDER — ROSUVASTATIN CALCIUM 10 MG PO TABS
10.0000 mg | ORAL_TABLET | Freq: Every day | ORAL | 3 refills | Status: DC
Start: 2023-01-30 — End: 2023-11-25

## 2023-01-30 MED ORDER — FLUTICASONE PROPIONATE 50 MCG/ACT NA SUSP: 2.0000 | Freq: Every day | NASAL | 0 refills | Status: AC

## 2023-01-31 NOTE — Telephone Encounter (Signed)
Cook, Jayce G, DO     Meds sent.   

## 2023-02-07 DIAGNOSIS — D0471 Carcinoma in situ of skin of right lower limb, including hip: Secondary | ICD-10-CM | POA: Diagnosis not present

## 2023-02-07 DIAGNOSIS — D0461 Carcinoma in situ of skin of right upper limb, including shoulder: Secondary | ICD-10-CM | POA: Diagnosis not present

## 2023-02-07 DIAGNOSIS — L821 Other seborrheic keratosis: Secondary | ICD-10-CM | POA: Diagnosis not present

## 2023-02-07 DIAGNOSIS — D1801 Hemangioma of skin and subcutaneous tissue: Secondary | ICD-10-CM | POA: Diagnosis not present

## 2023-02-07 DIAGNOSIS — Z85828 Personal history of other malignant neoplasm of skin: Secondary | ICD-10-CM | POA: Diagnosis not present

## 2023-02-07 DIAGNOSIS — L814 Other melanin hyperpigmentation: Secondary | ICD-10-CM | POA: Diagnosis not present

## 2023-02-07 DIAGNOSIS — D045 Carcinoma in situ of skin of trunk: Secondary | ICD-10-CM | POA: Diagnosis not present

## 2023-02-07 DIAGNOSIS — D0359 Melanoma in situ of other part of trunk: Secondary | ICD-10-CM | POA: Diagnosis not present

## 2023-02-07 DIAGNOSIS — L570 Actinic keratosis: Secondary | ICD-10-CM | POA: Diagnosis not present

## 2023-02-18 ENCOUNTER — Ambulatory Visit (INDEPENDENT_AMBULATORY_CARE_PROVIDER_SITE_OTHER): Payer: PPO | Admitting: Family Medicine

## 2023-02-18 ENCOUNTER — Encounter: Payer: Self-pay | Admitting: Family Medicine

## 2023-02-18 VITALS — BP 129/86 | HR 70 | Temp 98.8°F | Wt 206.0 lb

## 2023-02-18 DIAGNOSIS — J029 Acute pharyngitis, unspecified: Secondary | ICD-10-CM | POA: Diagnosis not present

## 2023-02-18 MED ORDER — AZITHROMYCIN 250 MG PO TABS: ORAL_TABLET | ORAL | 0 refills | Status: AC

## 2023-02-18 NOTE — Progress Notes (Signed)
Subjective:  Patient ID: Jennifer Coleman, female    DOB: 08-11-1951  Age: 72 y.o. MRN: [id number]  CC: Chief Complaint  Patient presents with   Headache   Sore Throat    Swollen and trouble swallowing  , x 3 days , chills aches Negative covid   Cough    HPI:  72 year old female presents for evaluation of the above.  Patient reports that she has been sick since Friday. Started with sore throat. She has had chills and aches. Some cough and headache. Predominant symptom is severe sore throat. No sick contacts. No relief with OTC treatments.  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  Tobacco Use   Smoking status: Former    Types: Cigarettes    Quit date: 09/22/1992    Years since quitting: 30.4   Smokeless tobacco: Never  Vaping Use   Vaping Use: Never used  Substance and Sexual Activity   Alcohol use: No    Alcohol/week: 0.0 standard drinks of alcohol   Drug use: No   Sexual activity: Not Currently  Other Topics Concern   Not on file  Social History Narrative   Lives with husband in , [place]   Right handed   Social Determinants of Health   Financial Resource Strain: Low Risk  (04/03/2022)   Overall Financial Resource Strain (CARDIA)    Difficulty of Paying Living Expenses: Not hard at all  Food Insecurity: No Food Insecurity (04/03/2022)   Hunger Vital Sign    Worried About Running Out of  Food in the Last Year: Never true    Ran Out of Food in the Last Year: Never true  Transportation Needs: No Transportation Needs (04/03/2022)   PRAPARE - Administrator, Civil Service (Medical): No    Lack of Transportation (Non-Medical): No  Physical Activity: Insufficiently Active (04/03/2022)   Exercise Vital Sign    Days of Exercise per Week: 5 days    Minutes of Exercise per Session: 20 min  Stress: No Stress Concern Present (04/03/2022)   [employer] of Occupational Health - Occupational Stress Questionnaire    Feeling of Stress : Not at all  Social Connections: Socially Integrated (04/03/2022)   Social Connection and Isolation Panel [NHANES]    Frequency of Communication with Friends and Family: More than three times a week    Frequency of Social Gatherings with Friends and Family: More than three times a week    Attends Religious Services: More than 4 times per year    Active Member of [employer] or Organizations: Yes    Attends Engineer, structural: More than 4 times per year    Marital Status: Married    Review of Systems Per HPI  Objective:  BP 129/86   Pulse 70   Temp 98.8 F (37.1 C)   Wt 206 lb (93.4 kg)  SpO2 95%   BMI 34.28 kg/m      02/18/2023    4:11 PM 01/15/2023   10:39 AM 09/27/2022    8:20 AM  BP/Weight  Systolic BP 129 130 123  Diastolic BP 86 76 66  Wt. (Lbs) 206 209 210.6  BMI 34.28 kg/m2 34.78 kg/m2 35.05 kg/m2    Physical Exam Vitals and nursing note reviewed.  Constitutional:      General: She is not in acute distress. HENT:     Head: Normocephalic and atraumatic.     Right Ear: Tympanic membrane normal.     Left Ear: Tympanic membrane normal.     Mouth/Throat:     Pharynx: Posterior oropharyngeal erythema present.  Cardiovascular:     Rate and Rhythm: Normal rate and regular rhythm.  Pulmonary:     Effort: Pulmonary effort is normal.     Breath sounds: Normal breath sounds.  Neurological:     Mental Status: She  is alert.     Lab Results  Component Value Date   WBC 8.5 01/15/2023   HGB 13.9 01/15/2023   HCT 42.4 01/15/2023   PLT 254 01/15/2023   GLUCOSE 84 01/15/2023   CHOL 257 (H) 01/15/2023   TRIG 69 01/15/2023   HDL 108 01/15/2023   LDLCALC 138 (H) 01/15/2023   ALT 13 01/15/2023   AST 20 01/15/2023   NA 143 01/15/2023   K 4.6 01/15/2023   CL 105 01/15/2023   CREATININE 0.76 01/15/2023   BUN 21 01/15/2023   CO2 25 01/15/2023   TSH 2.630 11/08/2017   INR 1.08 11/23/2018     Assessment & Plan:   Problem List Items Addressed This Visit       Respiratory   Pharyngitis - Primary    Given persistent symptoms and lack of improvement, treating with Azithromycin (has allergy to PCN).       Meds ordered this encounter  Medications   azithromycin (ZITHROMAX) 250 MG tablet    Sig: Take 2 tablets on day 1, then 1 tablet daily on days 2 through 5    Dispense:  6 tablet    Refill:  0    Follow-up:  Return if symptoms worsen or fail to improve.  Everlene Other DO [facility]

## 2023-02-18 NOTE — Patient Instructions (Signed)
Antibiotic as prescribed.  Take care  Dr. Johonna Binette  

## 2023-02-18 NOTE — Assessment & Plan Note (Signed)
Given persistent symptoms and lack of improvement, treating with Azithromycin (has allergy to PCN).

## 2023-03-08 ENCOUNTER — Telehealth: Payer: Self-pay | Admitting: Family Medicine

## 2023-03-08 NOTE — Telephone Encounter (Signed)
Ivy Lynn scheduled for their annual wellness visit. Appointment made for 04/12/2023.  Thank you,  Judeth Cornfield,  AMB Clinical Support [facility] AWV Program Direct Dial ??[phone]

## 2023-03-18 DIAGNOSIS — D0359 Melanoma in situ of other part of trunk: Secondary | ICD-10-CM | POA: Diagnosis not present

## 2023-03-18 DIAGNOSIS — D045 Carcinoma in situ of skin of trunk: Secondary | ICD-10-CM | POA: Diagnosis not present

## 2023-04-02 DIAGNOSIS — M5416 Radiculopathy, lumbar region: Secondary | ICD-10-CM | POA: Diagnosis not present

## 2023-04-12 ENCOUNTER — Telehealth: Payer: Self-pay

## 2023-04-12 ENCOUNTER — Ambulatory Visit (INDEPENDENT_AMBULATORY_CARE_PROVIDER_SITE_OTHER): Payer: PPO

## 2023-04-12 VITALS — BP 129/86 | Ht 65.0 in | Wt 202.0 lb

## 2023-04-12 DIAGNOSIS — Z0000 Encounter for general adult medical examination without abnormal findings: Secondary | ICD-10-CM

## 2023-04-12 NOTE — Progress Notes (Signed)
 I connected with  Jennifer Coleman on 04/12/23 by a audio enabled telemedicine application and verified that I am speaking with the correct person using two identifiers.  Patient Location: Home  Provider Location: Home Office  I discussed the limitations of evaluation and management by telemedicine. The patient expressed understanding and agreed to proceed.  Subjective:   Jennifer Coleman is a 72 y.o. female who presents for Medicare Annual (Subsequent) preventive examination.  Review of Systems      Cardiac Risk Factors include: advanced age (>71men, >15 women);dyslipidemia;hypertension;obesity (BMI >30kg/m2)     Objective:    Today's Vitals   04/12/23 1131  BP: 129/86  Weight: 202 lb (91.6 kg)  Height: 5\' 5"  (1.651 m)  PainSc: 5    Body mass index is 33.61 kg/m.     04/12/2023   11:38 AM 04/03/2022    9:50 AM 11/22/2021   12:25 PM 08/17/2019   11:35 AM 08/13/2019    2:30 PM 12/30/2018    2:45 PM 11/21/2018    4:53 PM  Advanced Directives  Does Patient Have a Medical Advance Directive? No No No No No No No  Would patient like information on creating a medical advance directive? No - Patient declined No - Patient declined  No - Patient declined No - Patient declined No - Patient declined No - Patient declined    Current Medications (verified) Outpatient Encounter Medications as of 04/12/2023  Medication Sig   alendronate (FOSAMAX) 70 MG tablet TAKE ONE TABLET BY MOUTH ONCE A WEEK IN THE MORNING WITH A FULL GLASS OF WATER ON AN EMPTY STOMACH. DO NOT LAY DOWN FOR 30 MINUTES.   aspirin EC 81 MG tablet Take 81 mg by mouth daily. Swallow whole.   cetirizine (ZYRTEC) 10 MG tablet Take 1 tablet (10 mg total) by mouth daily.   diclofenac sodium (VOLTAREN) 1 % GEL APPLY TWO GRAMS TO SHOULDER UP TO FOUR TIMES DAILY (Patient taking differently: Apply 2 g topically daily as needed (pain). Knee)   enalapril (VASOTEC) 20 MG tablet Take 1 tablet (20 mg total) by mouth 2 (two) times daily.    escitalopram (LEXAPRO) 20 MG tablet Take 1 tablet (20 mg total) by mouth daily.   fluticasone (FLONASE) 50 MCG/ACT nasal spray Place 2 sprays into both nostrils daily.   Multiple Vitamins-Minerals (ONE-A-DAY WOMENS 50 PLUS PO) Take 1 tablet by mouth daily.   PROAIR HFA 108 (90 Base) MCG/ACT inhaler INHALE TWO PUFFS BY MOUTH EVERY 4 TO 6 HOURS AS NEEDED FOR FOR WHEEZING   rosuvastatin (CRESTOR) 10 MG tablet Take 1 tablet (10 mg total) by mouth daily.   hydrochlorothiazide (HYDRODIURIL) 25 MG tablet Take 1 tablet (25 mg total) by mouth as needed (Fluid). (Patient not taking: Reported on 04/12/2023)   No facility-administered encounter medications on file as of 04/12/2023.    Allergies (verified) Penicillins, Shellfish allergy, Gabapentin, Levaquin [levofloxacin], Lyrica [pregabalin], Codeine, and Morphine and codeine   History: Past Medical History:  Diagnosis Date   Anxiety    Arthritis    Basal cell carcinoma 02/02/2016   Left auricle - MOHs   Dysrhythmia    PVCs   Family history of anesthesia complication 25 yrs ago   Sister allergic to sccinocholine - unable to come off ventilator   Hypertension    Lumbar herniated disc    Pneumonia    PONV (postoperative nausea and vomiting)    Premature ventricular contractions    SCC (squamous cell carcinoma) 05/09/2021  in situ- left anterior neck   SCC (squamous cell carcinoma) 05/09/2021   in situ- left upper arm-posterior (EXC)   SCC (squamous cell carcinoma) 05/09/2021   in situ- right inner lower leg   Scoliosis    Shingles    Squamous cell carcinoma of skin 11/14/2011   right cheek - CX3 + 5FU   Squamous cell carcinoma of skin 11/14/2011   right canthus, superior - CX3 + 5FU   Squamous cell carcinoma of skin 11/14/2011   left cheek - CX3 + 5FU   Squamous cell carcinoma of skin 07/26/2015   right forearm - CX3 + 5FU   Squamous cell carcinoma of skin 02/02/2016   left tip shoulder - CX3 + cautery + 5FU    Squamous cell  carcinoma of skin 07/17/2016   left post shoulder - tx after biopsy   Squamous cell carcinoma of skin 07/17/2016   right low shin - tx after biopsy   Squamous cell carcinoma of skin 03/13/2019   left post shoulder - tx after biopsy   Squamous cell carcinoma of skin 03/13/2019   right inner ankle - tx after biopsy   Squamous cell carcinoma of skin 09/23/2019   left cheek - tx after biopsy + MOHs (Dr. Jeannine Boga)   Squamous cell carcinoma of skin 09/23/2019   right upper shin, medial - CX3 + 5FU   Squamous cell carcinoma of skin 09/23/2019   right upper shin, lateral - CX3 + 5FU   Varicose veins    Varicose veins of left lower extremity    Past Surgical History:  Procedure Laterality Date   CARPAL TUNNEL RELEASE     CERVICAL DISC SURGERY  1992   Fusion   CHOLECYSTECTOMY N/A 11/21/2018   Procedure: LAPAROSCOPIC CHOLECYSTECTOMY;  Surgeon: Lucretia Roers, MD;  Location: [facility];  Service: General;  Laterality: N/A;   COLONOSCOPY N/A 06/07/2016   Procedure: COLONOSCOPY;  Surgeon: Malissa Hippo, MD;  Location: [facility];  Service: Endoscopy;  Laterality: N/A;  1:00   KNEE ARTHROSCOPY  09/16/2012   Procedure: ARTHROSCOPY KNEE;  Surgeon: Valeria Batman, MD;  Location: [facility] OR;  Service: Orthopedics;  Laterality: Right;  Right Knee Arthroscopy   LAPAROSCOPIC GASTRIC SLEEVE RESECTION N/A 08/17/2019   Procedure: LAPAROSCOPIC GASTRIC SLEEVE RESECTION AND HIATAL HERNIA REPAIR, UPPPER ENDO AND ERAS PATHWAY;  Surgeon: Luretha Murphy, MD;  Location: [facility];  Service: General;  Laterality: N/A;   SALPINGOOPHORECTOMY Left 1985   SHOULDER ARTHROSCOPY WITH OPEN ROTATOR CUFF REPAIR AND DISTAL CLAVICLE ACROMINECTOMY Right 05/03/2016   Procedure: RIGHT SHOULDER ARTHROSCOPY WITH MINI-OPEN ROTATOR CUFF REPAIR,DISTAL CLAVICLE RESECTION, SUBACROMIAL DECOMPRESSION.;  Surgeon: Valeria Batman, MD;  Location: [facility];  Service: Orthopedics;  Laterality: Right;   SHOULDER ARTHROSCOPY WITH  ROTATOR CUFF REPAIR Right 09/25/2016   Procedure: Right Shoulder Manipulation, Arthroscopic Debridement of Joint, Removal of Loose Body and Re-Repair of Rotator Cuff;  Surgeon: Valeria Batman, MD;  Location: [facility] OR;  Service: Orthopedics;  Laterality: Right;   Family History  Problem Relation Age of Onset   Heart disease Sister    Heart disease Brother        50's   Diabetes Mellitus II Sister    Multiple myeloma Mother    Hypertension Sister    Hypertension Father    Heart attack Father    Social History   Socioeconomic History   Marital status: Married    Spouse name: Not on file   Number of children:  Not on file   Years of education: Not on file   Highest education level: Not on file  Occupational History   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  Tobacco Use   Smoking status: Former    Types: Cigarettes    Quit date: 09/22/1992    Years since quitting: 30.5   Smokeless tobacco: Never  Vaping Use   Vaping Use: Never used  Substance and Sexual Activity   Alcohol use: No    Alcohol/week: 0.0 standard drinks of alcohol   Drug use: No   Sexual activity: Not Currently  Other Topics Concern   Not on file  Social History Narrative   Lives with husband in [place], [place]   Right handed   Social Determinants of Health   Financial Resource Strain: Low Risk  (04/12/2023)   Overall Financial Resource Strain (CARDIA)    Difficulty of Paying Living Expenses: Not hard at all  Food Insecurity: No Food Insecurity (04/12/2023)   Hunger Vital Sign    Worried About Running Out of Food in the Last Year: Never true    Ran Out of Food in the Last Year: Never true  Transportation Needs: No Transportation Needs (04/12/2023)   PRAPARE - Administrator, Civil Service (Medical): No    Lack of Transportation (Non-Medical): No  Physical Activity: Sufficiently Active (04/12/2023)   Exercise Vital Sign    Days of Exercise per Week: 5 days    Minutes of Exercise per Session: 30 min  Stress: No Stress  Concern Present (04/12/2023)   [employer] of Occupational Health - Occupational Stress Questionnaire    Feeling of Stress : Not at all  Social Connections: Socially Integrated (04/12/2023)   Social Connection and Isolation Panel [NHANES]    Frequency of Communication with Friends and Family: More than three times a week    Frequency of Social Gatherings with Friends and Family: More than three times a week    Attends Religious Services: More than 4 times per year    Active Member of [employer] or Organizations: Yes    Attends Engineer, structural: More than 4 times per year    Marital Status: Married    Tobacco Counseling Counseling given: Yes   Clinical Intake:  Pre-visit preparation completed: Yes  Pain : 0-10 Pain Score: 5  Pain Type: Chronic pain Pain Location: Back Pain Orientation: Lower Pain Descriptors / Indicators: Aching, Constant Pain Onset: More than a month ago Pain Frequency: Constant     BMI - recorded: 33.61 Nutritional Status: BMI > 30  Obese Nutritional Risks: None Diabetes: No  How often do you need to have someone help you when you read instructions, pamphlets, or other written materials from your doctor or pharmacy?: 1 - Never  Diabetic?no  Interpreter Needed?: No  Information entered by ::  Jennifer Coleman, CMA   Activities of Daily Living    04/12/2023   11:39 AM  In your present state of health, do you have any difficulty performing the following activities:  Hearing? 0  Vision? 0  Difficulty concentrating or making decisions? 0  Walking or climbing stairs? 1  Comment chronic pain  Dressing or bathing? 0  Doing errands, shopping? 0  Preparing Food and eating ? N  Using the Toilet? N  In the past six months, have you accidently leaked urine? N  Do you have problems with loss of bowel control? N  Managing your Medications? N  Managing your Finances? N  Housekeeping or managing your Housekeeping?  N    Patient Care  Team: Tommie Sams, DO as PCP - General (Family Medicine) Janalyn Harder, MD (Inactive) as Consulting Physician (Dermatology) Glyn Ade, PA-C as Physician Assistant (Dermatology)  Indicate any recent Medical Services you may have received from other than [facility] providers in the past year (date may be approximate).     Assessment:   This is a routine wellness examination for Jennifer Coleman.  Hearing/Vision screen Hearing Screening - Comments:: Patient denies any hearing difficulties.   Vision Screening - Comments:: Wears rx glasses - up to date with routine eye exams with  Dr.Davis in [place]  Dietary issues and exercise activities discussed: Current Exercise Habits: Home exercise routine, Type of exercise: walking, Time (Minutes): 30, Frequency (Times/Week): 5, Weekly Exercise (Minutes/Week): 150, Exercise limited by: orthopedic condition(s)   Goals Addressed               This Visit's Progress     Patient Stated (pt-stated)        To be more consistent with her activity       Depression Screen    04/12/2023   11:37 AM 02/18/2023    4:24 PM 02/18/2023    4:23 PM 04/03/2022    9:39 AM 04/27/2021    9:57 AM 03/01/2021    3:25 PM 11/11/2020    9:11 AM  PHQ 2/9 Scores  PHQ - 2 Score 0 0 0 0 0 0 0  PHQ- 9 Score 0 0    0     Fall Risk    04/12/2023   11:38 AM 04/03/2022    9:52 AM 04/27/2021    9:57 AM 03/01/2021    3:24 PM 08/04/2020    1:24 PM  Fall Risk   Falls in the past year? 0 1 0 0 0  Number falls in past yr: 0 0  0   Injury with Fall? 0 1  0   Risk for fall due to : No Fall Risks History of fall(s);Impaired balance/gait No Fall Risks No Fall Risks   Follow up Falls prevention discussed Falls prevention discussed Falls evaluation completed Falls evaluation completed Falls evaluation completed    FALL RISK PREVENTION PERTAINING TO THE HOME:  Any stairs in or around the home? No  If so, are there any without handrails? No  Home free of loose throw rugs in walkways, pet  beds, electrical cords, etc? Yes  Adequate lighting in your home to reduce risk of falls? Yes   ASSISTIVE DEVICES UTILIZED TO PREVENT FALLS:  Life alert? No  Use of a cane, walker or w/c? No  Grab bars in the bathroom? No  Shower chair or bench in shower? No  Elevated toilet seat or a handicapped toilet? No   TIMED UP AND GO:  Was the test performed? No .    Cognitive Function:        04/12/2023   11:40 AM 04/03/2022    9:59 AM  6CIT Screen  What Year? 0 points 0 points  What month? 0 points 0 points  What time? 0 points 0 points  Count back from 20 0 points 0 points  Months in reverse 0 points 0 points  Repeat phrase 0 points 0 points  Total Score 0 points 0 points    Immunizations Immunization History  Administered Date(s) Administered   Fluad Quad(high Dose 65+) 11/01/2022   Influenza,inj,Quad PF,6+ Mos 07/30/2019   Influenza-Unspecified 08/05/2020, 08/25/2021   PFIZER(Purple Top)SARS-COV-2 Vaccination 12/11/2019, 01/08/2020, 10/27/2020  Pneumococcal Conjugate-13 04/27/2021   Pneumococcal Polysaccharide-23 08/18/2019   Tdap 03/01/2016   Zoster Recombinat (Shingrix) 08/25/2021, 02/15/2022    TDAP status: Up to date  Flu Vaccine status: Up to date  Pneumococcal vaccine status: Up to date  Covid-19 vaccine status: Information provided on how to obtain vaccines.   Qualifies for Shingles Vaccine? Yes   Zostavax completed Yes   Shingrix Completed?: Yes  Screening Tests Health Maintenance  Topic Date Due   COVID-19 Vaccine (4 - 2023-24 season) 06/29/2022   Medicare Annual Wellness (AWV)  04/04/2023   INFLUENZA VACCINE  05/30/2023   MAMMOGRAM  08/13/2024   DTaP/Tdap/Td (2 - Td or Tdap) 03/01/2026   Colonoscopy  06/07/2026   Pneumonia Vaccine 72+ Years old  Completed   DEXA SCAN  Completed   Hepatitis C Screening  Completed   Zoster Vaccines- Shingrix  Completed   HPV VACCINES  Aged Out    Health Maintenance  Health Maintenance Due  Topic Date Due    COVID-19 Vaccine (4 - 2023-24 season) 06/29/2022   Medicare Annual Wellness (AWV)  04/04/2023    Colorectal cancer screening: Type of screening: Colonoscopy. Completed 06/07/2016. Repeat every 10 years  Mammogram status: Completed 08/13/2022. Repeat every year  Bone Density status: Completed 11/25/2019. Results reflect: Bone density results: OSTEOPOROSIS. Repeat every 2 years. PATIENT DECLINED FOLLOW UP SCREENING Lung Cancer Screening: (Low Dose CT Chest recommended if Age 62-80 years, 30 pack-year currently smoking OR have quit w/in 15years.) does not qualify.   Additional Screening:  Hepatitis C Screening: does not qualify; Completed 11/05/2017  Vision Screening: Recommended annual ophthalmology exams for early detection of glaucoma and other disorders of the eye. Is the patient up to date with their annual eye exam?  Yes  Who is the provider or what is the name of the office in which the patient attends annual eye exams? DR.DAVIS EDEN Cornelius  Dental Screening: Recommended annual dental exams for proper oral hygiene  Community Resource Referral / Chronic Care Management: CRR required this visit?  No   CCM required this visit?  No      Plan:     I have personally reviewed and noted the following in the patient's chart:   Medical and social history Use of alcohol, tobacco or illicit drugs  Current medications and supplements including opioid prescriptions. Patient is not currently taking opioid prescriptions. Functional ability and status Nutritional status Physical activity Advanced directives List of other physicians Hospitalizations, surgeries, and ER visits in previous 12 months Vitals Screenings to include cognitive, depression, and falls Referrals and appointments  In addition, I have reviewed and discussed with patient certain preventive protocols, quality metrics, and best practice recommendations. A written personalized care plan for preventive services as well as  general preventive health recommendations were provided to patient.   Due to this visit being a telehealth visit, certain criteria was not obtained, such a blood pressure, CBG if patient is a diabetic, and timed up and go.   Due to this being a telephonic visit, the after visit summary with patients personalized plan was offered to patient via mail or my-chart. Patient would like to access their AVS via my-chart    Jennifer Coleman, CMA   04/12/2023   Nurse Notes: PATIENT DECLINED FOLLOW UP DEXA SCAN

## 2023-04-12 NOTE — Telephone Encounter (Signed)
Patient completed her AWVS this morning. She is asking if she can have a prescription for oral diclofenac vs the topical that she is currently using. Patient aware that message will be sent to provider.    Vieno Tarrant, CMA  [facility] AWV Team Direct Dial: [phone] 

## 2023-04-12 NOTE — Patient Instructions (Signed)
Ms. Jennifer Coleman , Thank you for taking time to come for your Medicare Wellness Visit. I appreciate your ongoing commitment to your health goals. Please review the following plan we discussed and let me know if I can assist you in the future.   These are the goals we discussed:  Goals       Exercise 3x per week (30 min per time)      Encouraged pt to increase as tolerated.      Patient Stated (pt-stated)      To be more consistent with her activity        This is a list of the screening recommended for you and due dates:  Health Maintenance  Topic Date Due   COVID-19 Vaccine (4 - 2023-24 season) 06/29/2022   Medicare Annual Wellness Visit  04/04/2023   Flu Shot  05/30/2023   Mammogram  08/13/2024   DTaP/Tdap/Td vaccine (2 - Td or Tdap) 03/01/2026   Colon Cancer Screening  06/07/2026   Pneumonia Vaccine  Completed   DEXA scan (bone density measurement)  Completed   Hepatitis C Screening  Completed   Zoster (Shingles) Vaccine  Completed   HPV Vaccine  Aged Out    Advanced directives: Advance directive discussed with you today. Even though you declined this today, please call our office should you change your mind, and we can give you the proper paperwork for you to fill out. Advance care planning is a way to make decisions about medical care that fits your values in case you are ever unable to make these decisions for yourself.  Information on Advanced Care Planning can be found at [facility] Advance Health Care Directives Advance Health Care Directives ([email or web address]    Conditions/risks identified: Keep up the great work!!  Next appointment: Follow up in one year for your annual wellness visit   April 17, 2024 at 11:00am telephone visit   Preventive Care 65 Years and Older, Female Preventive care refers to lifestyle choices and visits with your health care provider that can promote health and wellness. What does preventive care include? A yearly physical exam.  This is also called an annual well check. Dental exams once or twice a year. Routine eye exams. Ask your health care provider how often you should have your eyes checked. Personal lifestyle choices, including: Daily care of your teeth and gums. Regular physical activity. Eating a healthy diet. Avoiding tobacco and drug use. Limiting alcohol use. Practicing safe sex. Taking low-dose aspirin every day. Taking vitamin and mineral supplements as recommended by your health care provider. What happens during an annual well check? The services and screenings done by your health care provider during your annual well check will depend on your age, overall health, lifestyle risk factors, and family history of disease. Counseling  Your health care provider may ask you questions about your: Alcohol use. Tobacco use. Drug use. Emotional well-being. Home and relationship well-being. Sexual activity. Eating habits. History of falls. Memory and ability to understand (cognition). Work and work Astronomer. Reproductive health. Screening  You may have the following tests or measurements: Height, weight, and BMI. Blood pressure. Lipid and cholesterol levels. These may be checked every 5 years, or more frequently if you are over 68 years old. Skin check. Lung cancer screening. You may have this screening every year starting at age 22 if you have a 30-pack-year history of smoking and currently smoke or have quit within the past 15 years. Fecal occult blood test (  FOBT) of the stool. You may have this test every year starting at age 40. Flexible sigmoidoscopy or colonoscopy. You may have a sigmoidoscopy every 5 years or a colonoscopy every 10 years starting at age 93. Hepatitis C blood test. Hepatitis B blood test. Sexually transmitted disease (STD) testing. Diabetes screening. This is done by checking your blood sugar (glucose) after you have not eaten for a while (fasting). You may have this done  every 1-3 years. Bone density scan. This is done to screen for osteoporosis. You may have this done starting at age 79. Mammogram. This may be done every 1-2 years. Talk to your health care provider about how often you should have regular mammograms. Talk with your health care provider about your test results, treatment options, and if necessary, the need for more tests. Vaccines  Your health care provider may recommend certain vaccines, such as: Influenza vaccine. This is recommended every year. Tetanus, diphtheria, and acellular pertussis (Tdap, Td) vaccine. You may need a Td booster every 10 years. Zoster vaccine. You may need this after age 76. Pneumococcal 13-valent conjugate (PCV13) vaccine. One dose is recommended after age 56. Pneumococcal polysaccharide (PPSV23) vaccine. One dose is recommended after age 43. Talk to your health care provider about which screenings and vaccines you need and how often you need them. This information is not intended to replace advice given to you by your health care provider. Make sure you discuss any questions you have with your health care provider. Document Released: 11/11/2015 Document Revised: 07/04/2016 Document Reviewed: 08/16/2015 Elsevier Interactive Patient Education  2017 [employer].  Fall Prevention in the Home Falls can cause injuries. They can happen to people of all ages. There are many things you can do to make your home safe and to help prevent falls. What can I do on the outside of my home? Regularly fix the edges of walkways and driveways and fix any cracks. Remove anything that might make you trip as you walk through a door, such as a raised step or threshold. Trim any bushes or trees on the path to your home. Use bright outdoor lighting. Clear any walking paths of anything that might make someone trip, such as rocks or tools. Regularly check to see if handrails are loose or broken. Make sure that both sides of any steps have  handrails. Any raised decks and porches should have guardrails on the edges. Have any leaves, snow, or ice cleared regularly. Use sand or salt on walking paths during winter. Clean up any spills in your garage right away. This includes oil or grease spills. What can I do in the bathroom? Use night lights. Install grab bars by the toilet and in the tub and shower. Do not use towel bars as grab bars. Use non-skid mats or decals in the tub or shower. If you need to sit down in the shower, use a plastic, non-slip stool. Keep the floor dry. Clean up any water that spills on the floor as soon as it happens. Remove soap buildup in the tub or shower regularly. Attach bath mats securely with double-sided non-slip rug tape. Do not have throw rugs and other things on the floor that can make you trip. What can I do in the bedroom? Use night lights. Make sure that you have a light by your bed that is easy to reach. Do not use any sheets or blankets that are too big for your bed. They should not hang down onto the floor. Have a  firm chair that has side arms. You can use this for support while you get dressed. Do not have throw rugs and other things on the floor that can make you trip. What can I do in the kitchen? Clean up any spills right away. Avoid walking on wet floors. Keep items that you use a lot in easy-to-reach places. If you need to reach something above you, use a strong step stool that has a grab bar. Keep electrical cords out of the way. Do not use floor polish or wax that makes floors slippery. If you must use wax, use non-skid floor wax. Do not have throw rugs and other things on the floor that can make you trip. What can I do with my stairs? Do not leave any items on the stairs. Make sure that there are handrails on both sides of the stairs and use them. Fix handrails that are broken or loose. Make sure that handrails are as long as the stairways. Check any carpeting to make sure  that it is firmly attached to the stairs. Fix any carpet that is loose or worn. Avoid having throw rugs at the top or bottom of the stairs. If you do have throw rugs, attach them to the floor with carpet tape. Make sure that you have a light switch at the top of the stairs and the bottom of the stairs. If you do not have them, ask someone to add them for you. What else can I do to help prevent falls? Wear shoes that: Do not have high heels. Have rubber bottoms. Are comfortable and fit you well. Are closed at the toe. Do not wear sandals. If you use a stepladder: Make sure that it is fully opened. Do not climb a closed stepladder. Make sure that both sides of the stepladder are locked into place. Ask someone to hold it for you, if possible. Clearly mark and make sure that you can see: Any grab bars or handrails. First and last steps. Where the edge of each step is. Use tools that help you move around (mobility aids) if they are needed. These include: Canes. Walkers. Scooters. Crutches. Turn on the lights when you go into a dark area. Replace any light bulbs as soon as they burn out. Set up your furniture so you have a clear path. Avoid moving your furniture around. If any of your floors are uneven, fix them. If there are any pets around you, be aware of where they are. Review your medicines with your doctor. Some medicines can make you feel dizzy. This can increase your chance of falling. Ask your doctor what other things that you can do to help prevent falls. This information is not intended to replace advice given to you by your health care provider. Make sure you discuss any questions you have with your health care provider. Document Released: 08/11/2009 Document Revised: 03/22/2016 Document Reviewed: 11/19/2014 Elsevier Interactive Patient Education  2017 [employer].

## 2023-04-12 NOTE — Telephone Encounter (Signed)
Patient completed her AWVS this morning. She is asking if she can have a prescription for oral diclofenac vs the topical that she is currently using. Patient aware that message will be sent to provider.   Abby Wingate, CMA  [facility] AWV Team Direct Dial: [phone]

## 2023-04-14 ENCOUNTER — Other Ambulatory Visit: Payer: Self-pay | Admitting: Family Medicine

## 2023-04-15 DIAGNOSIS — M25561 Pain in right knee: Secondary | ICD-10-CM | POA: Diagnosis not present

## 2023-04-15 NOTE — Telephone Encounter (Signed)
Sent message to ask patient.

## 2023-04-16 ENCOUNTER — Other Ambulatory Visit: Payer: Self-pay | Admitting: Family Medicine

## 2023-04-16 MED ORDER — DICLOFENAC SODIUM 75 MG PO TBEC: 75.0000 mg | DELAYED_RELEASE_TABLET | Freq: Two times a day (BID) | ORAL | 0 refills | Status: DC | PRN

## 2023-04-18 DIAGNOSIS — M5416 Radiculopathy, lumbar region: Secondary | ICD-10-CM | POA: Diagnosis not present

## 2023-04-22 ENCOUNTER — Other Ambulatory Visit: Payer: Self-pay | Admitting: Family Medicine

## 2023-04-24 ENCOUNTER — Other Ambulatory Visit: Payer: Self-pay

## 2023-04-24 MED ORDER — DICLOFENAC SODIUM 75 MG PO TBEC: 75.0000 mg | DELAYED_RELEASE_TABLET | Freq: Two times a day (BID) | ORAL | 0 refills | Status: DC | PRN

## 2023-04-25 ENCOUNTER — Other Ambulatory Visit: Payer: Self-pay | Admitting: Family Medicine

## 2023-05-03 ENCOUNTER — Other Ambulatory Visit (HOSPITAL_COMMUNITY): Payer: Self-pay

## 2023-05-22 ENCOUNTER — Other Ambulatory Visit: Payer: Self-pay | Admitting: Family Medicine

## 2023-07-04 DIAGNOSIS — H35033 Hypertensive retinopathy, bilateral: Secondary | ICD-10-CM | POA: Diagnosis not present

## 2023-07-04 DIAGNOSIS — H524 Presbyopia: Secondary | ICD-10-CM | POA: Diagnosis not present

## 2023-07-15 ENCOUNTER — Other Ambulatory Visit: Payer: Self-pay | Admitting: Family Medicine

## 2023-07-18 ENCOUNTER — Encounter: Payer: Self-pay | Admitting: Family Medicine

## 2023-07-18 ENCOUNTER — Ambulatory Visit: Payer: PPO | Admitting: Family Medicine

## 2023-07-18 ENCOUNTER — Ambulatory Visit (HOSPITAL_COMMUNITY)
Admission: RE | Admit: 2023-07-18 | Discharge: 2023-07-18 | Disposition: A | Discharge status: Home / Self Care | Payer: PPO | Source: Ambulatory Visit | Attending: Family Medicine | Admitting: Family Medicine

## 2023-07-18 VITALS — BP 138/65 | HR 57 | Temp 97.5°F | Ht 65.0 in | Wt 209.0 lb

## 2023-07-18 DIAGNOSIS — I10 Essential (primary) hypertension: Secondary | ICD-10-CM | POA: Diagnosis not present

## 2023-07-18 DIAGNOSIS — M810 Age-related osteoporosis without current pathological fracture: Secondary | ICD-10-CM | POA: Diagnosis not present

## 2023-07-18 DIAGNOSIS — R053 Chronic cough: Secondary | ICD-10-CM | POA: Insufficient documentation

## 2023-07-18 MED ORDER — ALENDRONATE SODIUM 70 MG PO TABS: ORAL_TABLET | ORAL | 3 refills | Status: DC

## 2023-07-18 NOTE — Assessment & Plan Note (Signed)
Continue Fosamax. Refilled today.

## 2023-07-18 NOTE — Assessment & Plan Note (Signed)
Chest x-ray to ensure no underlying cardiopulmonary abnormality. Recommended stopping enalapril.  Patient will consider after chest x-ray returns.

## 2023-07-18 NOTE — Assessment & Plan Note (Signed)
Stable currently.  Given chronic cough I have recommend change in therapy with discontinuation of enalapril.  Patient will continue HCTZ.  She will consider discontinuation of enalapril.  She wants to wait until her chest x-ray is back.

## 2023-07-18 NOTE — Patient Instructions (Signed)
Chest xray at the hospital. We will call with results.  Fosamax refilled.  Consider discontinuation of  Enalapril.  Follow up in 6 months or sooner if needed.

## 2023-07-18 NOTE — Progress Notes (Signed)
Subjective:  Patient ID: Jennifer Coleman, female    DOB: 09-08-1951  Age: 72 y.o. MRN: [id number]  CC: Chief Complaint  Patient presents with   6 month follow up   Cough    3 to 4 months non prod    HPI:  72 year old female presents for follow-up.  Patient continues to have ongoing headaches.  MRI imaging did not reveal any acute or worrisome findings.  She has tried zonisamide and did not tolerate.  It was recommended that she try nortriptyline.  She did not want to take this medication.  She is still having frequent headaches.  She states that she does not want any intervention at this time.  Hypertension well-controlled on enalapril and HCTZ.  Patient states that she needs a refill on her Fosamax.  She is tolerating.  Patient also reports 3 to 66-month history of dry cough.  It is becoming more more troublesome.  No fever.  I am concerned that this may be coming from enalapril.  Will discuss today.  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   Depression with anxiety 11/05/2017   Essential hypertension 09/12/2014    Social Hx   Social History   Socioeconomic History   Marital status: Married    Spouse name: Not on file   Number of children: Not on file   Years of education: Not on file   Highest education level: Not on file  Occupational History   Occupation: Insurance underwriter  Tobacco Use   Smoking status: Former    Current packs/day: 0.00    Types: Cigarettes    Quit date: 09/22/1992    Years since quitting: 30.8   Smokeless tobacco: Never  Vaping Use   Vaping status: Never Used  Substance and Sexual Activity   Alcohol use: No    Alcohol/week: 0.0 standard drinks of alcohol   Drug use: No   Sexual activity: Not Currently   Other Topics Concern   Not on file  Social History Narrative   Lives with husband in [place], [place]   Right handed   Social Determinants of Health   Financial Resource Strain: Low Risk  (04/12/2023)   Overall Financial Resource Strain (CARDIA)    Difficulty of Paying Living Expenses: Not hard at all  Food Insecurity: No Food Insecurity (04/12/2023)   Hunger Vital Sign    Worried About Running Out of Food in the Last Year: Never true    Ran Out of Food in the Last Year: Never true  Transportation Needs: No Transportation Needs (04/12/2023)   PRAPARE - Administrator, Civil Service (Medical): No    Lack of Transportation (Non-Medical): No  Physical Activity: Sufficiently Active (04/12/2023)   Exercise Vital Sign    Days of Exercise per Week: 5 days    Minutes of Exercise per Session: 30 min  Stress: No Stress Concern Present (04/12/2023)   [employer] of Occupational Health - Occupational Stress Questionnaire    Feeling of Stress : Not at all  Social Connections: Socially Integrated (04/12/2023)   Social Connection and Isolation Panel [NHANES]    Frequency of Communication with Friends and Family: More than three times a week    Frequency of Social Gatherings with Friends and Family: More than  three times a week    Attends Religious Services: More than 4 times per year    Active Member of Clubs or Organizations: Yes    Attends Engineer, structural: More than 4 times per year    Marital Status: Married    Review of Systems Per HPI  Objective:  BP 138/65   Pulse (!) 57   Temp (!) 97.5 F (36.4 C)   Ht 5\' 5"  (1.651 m)   Wt 209 lb (94.8 kg)   SpO2 98%   BMI 34.78 kg/m      07/18/2023   10:46 AM 04/12/2023   11:31 AM 02/18/2023    4:11 PM  BP/Weight  Systolic BP 138 129 129  Diastolic BP 65 86 86  Wt. (Lbs) 209 202 206  BMI 34.78 kg/m2 33.61 kg/m2 34.28 kg/m2    Physical Exam Vitals and nursing note reviewed.  Constitutional:      General: She is  not in acute distress.    Appearance: Normal appearance. She is obese.  HENT:     Head: Normocephalic and atraumatic.  Eyes:     General:        Right eye: No discharge.        Left eye: No discharge.     Conjunctiva/sclera: Conjunctivae normal.  Cardiovascular:     Rate and Rhythm: Normal rate and regular rhythm.  Pulmonary:     Effort: Pulmonary effort is normal.     Breath sounds: Normal breath sounds. No wheezing, rhonchi or rales.  Neurological:     Mental Status: She is alert.  Psychiatric:        Mood and Affect: Mood normal.        Behavior: Behavior normal.     Lab Results  Component Value Date   WBC 8.5 01/15/2023   HGB 13.9 01/15/2023   HCT 42.4 01/15/2023   PLT 254 01/15/2023   GLUCOSE 84 01/15/2023   CHOL 257 (H) 01/15/2023   TRIG 69 01/15/2023   HDL 108 01/15/2023   LDLCALC 138 (H) 01/15/2023   ALT 13 01/15/2023   AST 20 01/15/2023   NA 143 01/15/2023   K 4.6 01/15/2023   CL 105 01/15/2023   CREATININE 0.76 01/15/2023   BUN 21 01/15/2023   CO2 25 01/15/2023   TSH 2.630 11/08/2017   INR 1.08 11/23/2018     Assessment & Plan:   Problem List Items Addressed This Visit       Cardiovascular and Mediastinum   Essential hypertension    Stable currently.  Given chronic cough I have recommend change in therapy with discontinuation of enalapril.  Patient will continue HCTZ.  She will consider discontinuation of enalapril.  She wants to wait until her chest x-ray is back.        Musculoskeletal and Integument   Osteoporosis    Continue Fosamax.  Refilled today.      Relevant Medications   alendronate (FOSAMAX) 70 MG tablet     Other   Chronic cough - Primary    Chest x-ray to ensure no underlying cardiopulmonary abnormality. Recommended stopping enalapril.  Patient will consider after chest x-ray returns.      Relevant Orders   DG Chest 2 View    Meds ordered this encounter  Medications   alendronate (FOSAMAX) 70 MG tablet    Sig: Take  with a full glass of water on an empty stomach.    Dispense:  12 tablet    Refill:  3    Follow-up:  Return in about 6 months (around 01/15/2024).  Everlene Other DO [facility]

## 2023-07-26 ENCOUNTER — Other Ambulatory Visit (HOSPITAL_COMMUNITY): Payer: Self-pay | Admitting: Family Medicine

## 2023-07-26 DIAGNOSIS — Z1231 Encounter for screening mammogram for malignant neoplasm of breast: Secondary | ICD-10-CM

## 2023-07-29 ENCOUNTER — Other Ambulatory Visit: Payer: Self-pay | Admitting: Family Medicine

## 2023-07-31 ENCOUNTER — Ambulatory Visit (HOSPITAL_COMMUNITY): Payer: PPO

## 2023-08-15 ENCOUNTER — Other Ambulatory Visit: Payer: Self-pay | Admitting: Family Medicine

## 2023-08-15 DIAGNOSIS — F418 Other specified anxiety disorders: Secondary | ICD-10-CM

## 2023-08-15 DIAGNOSIS — I10 Essential (primary) hypertension: Secondary | ICD-10-CM

## 2023-08-16 ENCOUNTER — Inpatient Hospital Stay (HOSPITAL_COMMUNITY): Admission: RE | Admit: 2023-08-16 | Payer: PPO | Source: Ambulatory Visit

## 2023-08-27 ENCOUNTER — Other Ambulatory Visit: Payer: Self-pay | Admitting: Family Medicine

## 2023-09-09 ENCOUNTER — Ambulatory Visit (HOSPITAL_COMMUNITY)
Admission: RE | Admit: 2023-09-09 | Discharge: 2023-09-09 | Disposition: A | Discharge status: Home / Self Care | Payer: PPO | Source: Ambulatory Visit | Attending: Family Medicine | Admitting: Family Medicine

## 2023-09-09 ENCOUNTER — Encounter (HOSPITAL_COMMUNITY): Payer: Self-pay

## 2023-09-09 DIAGNOSIS — Z1231 Encounter for screening mammogram for malignant neoplasm of breast: Secondary | ICD-10-CM | POA: Insufficient documentation

## 2023-09-12 ENCOUNTER — Telehealth: Payer: Self-pay | Admitting: Family Medicine

## 2023-09-12 NOTE — Telephone Encounter (Signed)
Copied from CRM [id number]. Topic: Clinical - Medication Refill >> Sep 12, 2023  2:23 PM Desma Mcgregor wrote: Most Recent Primary Care Visit:  Provider: Tommie Sams  Department: [facility]  Visit Type: OFFICE VISIT  Date: 07/18/2023  Medication: ***  Has the patient contacted their pharmacy?  (Agent: If no, request that the patient contact the pharmacy for the refill. If patient does not wish to contact the pharmacy document the reason why and proceed with request.) (Agent: If yes, when and what did the pharmacy advise?)  Is this the correct pharmacy for this prescription?  If no, delete pharmacy and type the correct one.  This is the patient's preferred pharmacy:  [facility] - [place], [place] - [address] [place] [address] Phone: [phone] Fax: [phone]  [facility] [phone] - [place], [place] - [address] AT [facility] [address] [place] [address] Phone: [phone] Fax: [phone]   Has the prescription been filled recently?   Is the patient out of the medication?   Has the patient been seen for an appointment in the last year OR does the patient have an upcoming appointment?   Can we respond through MyChart?   Agent: Please be advised that Rx refills may take up to 3 business days. We ask that you follow-up with your pharmacy.

## 2023-09-24 ENCOUNTER — Telehealth: Payer: Self-pay

## 2023-09-24 NOTE — Telephone Encounter (Signed)
Reason for CRM: Pt calling back about her request for an ointment that she needs for a scratch on her leg. It's been since 11/14. She provided the name of Mupirocin USP 2%. Please f/u at [phone]   Please advise

## 2023-09-25 ENCOUNTER — Other Ambulatory Visit: Payer: Self-pay | Admitting: Family Medicine

## 2023-09-25 MED ORDER — MUPIROCIN 2 % EX OINT: 1.0000 | TOPICAL_OINTMENT | Freq: Three times a day (TID) | CUTANEOUS | 0 refills | Status: AC

## 2023-09-29 ENCOUNTER — Other Ambulatory Visit: Payer: Self-pay | Admitting: Family Medicine

## 2023-10-03 DIAGNOSIS — L570 Actinic keratosis: Secondary | ICD-10-CM | POA: Diagnosis not present

## 2023-10-03 DIAGNOSIS — Z85828 Personal history of other malignant neoplasm of skin: Secondary | ICD-10-CM | POA: Diagnosis not present

## 2023-10-03 DIAGNOSIS — L720 Epidermal cyst: Secondary | ICD-10-CM | POA: Diagnosis not present

## 2023-10-03 DIAGNOSIS — C44529 Squamous cell carcinoma of skin of other part of trunk: Secondary | ICD-10-CM | POA: Diagnosis not present

## 2023-10-03 DIAGNOSIS — L821 Other seborrheic keratosis: Secondary | ICD-10-CM | POA: Diagnosis not present

## 2023-10-03 DIAGNOSIS — Z85820 Personal history of malignant melanoma of skin: Secondary | ICD-10-CM | POA: Diagnosis not present

## 2023-10-03 DIAGNOSIS — S80811A Abrasion, right lower leg, initial encounter: Secondary | ICD-10-CM | POA: Diagnosis not present

## 2023-10-03 DIAGNOSIS — L814 Other melanin hyperpigmentation: Secondary | ICD-10-CM | POA: Diagnosis not present

## 2023-10-03 DIAGNOSIS — D692 Other nonthrombocytopenic purpura: Secondary | ICD-10-CM | POA: Diagnosis not present

## 2023-10-20 ENCOUNTER — Encounter: Payer: Self-pay | Admitting: Pharmacist

## 2023-10-20 NOTE — Progress Notes (Signed)
Pharmacy Quality Measure Review  This patient is appearing on a report for being at risk of failing the adherence measure for hypertension (ACEi/ARB) medications this calendar year.   Medication: enalapril 20 mg Last fill date: 10/17 for 90 day supply  Insurance report was not up to date. No action needed at this time.   Jarrett Ables, PharmD PGY-1 Pharmacy Resident

## 2023-11-04 DIAGNOSIS — C44529 Squamous cell carcinoma of skin of other part of trunk: Secondary | ICD-10-CM | POA: Diagnosis not present

## 2023-11-04 DIAGNOSIS — Z85828 Personal history of other malignant neoplasm of skin: Secondary | ICD-10-CM | POA: Diagnosis not present

## 2023-11-08 ENCOUNTER — Other Ambulatory Visit: Payer: Self-pay | Admitting: Family Medicine

## 2023-11-24 ENCOUNTER — Other Ambulatory Visit: Payer: Self-pay | Admitting: Family Medicine

## 2023-12-17 ENCOUNTER — Ambulatory Visit (INDEPENDENT_AMBULATORY_CARE_PROVIDER_SITE_OTHER): Payer: PPO | Admitting: Family Medicine

## 2023-12-17 VITALS — BP 135/85 | Ht 65.0 in | Wt 213.0 lb

## 2023-12-17 DIAGNOSIS — R519 Headache, unspecified: Secondary | ICD-10-CM

## 2023-12-17 DIAGNOSIS — E785 Hyperlipidemia, unspecified: Secondary | ICD-10-CM | POA: Diagnosis not present

## 2023-12-17 DIAGNOSIS — M810 Age-related osteoporosis without current pathological fracture: Secondary | ICD-10-CM | POA: Diagnosis not present

## 2023-12-17 DIAGNOSIS — R5383 Other fatigue: Secondary | ICD-10-CM | POA: Diagnosis not present

## 2023-12-17 DIAGNOSIS — I10 Essential (primary) hypertension: Secondary | ICD-10-CM | POA: Diagnosis not present

## 2023-12-17 MED ORDER — ALENDRONATE SODIUM 70 MG PO TABS: ORAL_TABLET | ORAL | 3 refills | Status: DC

## 2023-12-17 NOTE — Assessment & Plan Note (Signed)
Stable.  Continue enalapril.

## 2023-12-17 NOTE — Assessment & Plan Note (Signed)
Patient's constellation of symptoms concerning for thyroid disease.  Labs today including thyroid studies.

## 2023-12-17 NOTE — Assessment & Plan Note (Signed)
Patient reports improvement after starting magnesium.

## 2023-12-17 NOTE — Assessment & Plan Note (Signed)
DEXA scan to reassess.  Continue Fosamax.

## 2023-12-17 NOTE — Progress Notes (Signed)
Subjective:  Patient ID: Jennifer Coleman, female    DOB: November 25, 1950  Age: 73 y.o. MRN: [id number]  CC: Follow-up   HPI:  73 year old female with below mentioned medical problems presents for follow-up.  Patient reports that she has had several symptoms of the past 6 to 7 months.  She reports cold intolerance, dry skin, brittle nails, fatigue.  She states that she is perplexed by her symptoms.  She states that the symptom seem to be worsening.  She reports healthy diet.  She states that she has made significant changes with decrease in sugar intake.  Denies chest pain or shortness of breath.  Patient's hypertension is stable on enalapril.  She is no longer taking HCTZ.  Patient remains on Crestor regarding hyperlipidemia.  Needs lipid panel to assess.  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   Depression with anxiety 11/05/2017   Essential hypertension 09/12/2014    Social Hx   Social History   Socioeconomic History   Marital status: Married    Spouse name: Not on file   Number of children: Not on file   Years of education: Not on file   Highest education level: Not on file  Occupational History   Occupation: Insurance underwriter  Tobacco Use   Smoking status: Former    Current packs/day: 0.00    Types: Cigarettes    Quit date: 09/22/1992    Years since quitting: 31.2   Smokeless tobacco: Never  Vaping Use   Vaping status: Never Used  Substance and Sexual Activity   Alcohol use: No    Alcohol/week: 0.0 standard drinks of alcohol   Drug use: No   Sexual activity: Not Currently  Other Topics Concern   Not on file  Social History Narrative   Lives with husband in [place], [place]   Right handed   Social Drivers of  Health   Financial Resource Strain: Low Risk  (04/12/2023)   Overall Financial Resource Strain (CARDIA)    Difficulty of Paying Living Expenses: Not hard at all  Food Insecurity: No Food Insecurity (04/12/2023)   Hunger Vital Sign    Worried About Running Out of Food in the Last Year: Never true    Ran Out of Food in the Last Year: Never true  Transportation Needs: No Transportation Needs (04/12/2023)   PRAPARE - Administrator, Civil Service (Medical): No    Lack of Transportation (Non-Medical): No  Physical Activity: Sufficiently Active (04/12/2023)   Exercise Vital Sign    Days of Exercise per Week: 5 days    Minutes of Exercise per Session: 30 min  Stress: No Stress Concern Present (04/12/2023)   [employer] of Occupational Health - Occupational Stress Questionnaire    Feeling of Stress : Not at all  Social Connections: Socially Integrated (04/12/2023)   Social Connection and Isolation Panel [NHANES]    Frequency of Communication with Friends and Family: More than three times a week    Frequency of Social Gatherings with Friends and Family: More than three times a week    Attends Religious Services: More than 4 times per year    Active Member of [employer] or Organizations: Yes    Attends Banker Meetings: More than 4  times per year    Marital Status: Married    Review of Systems Per HPI  Objective:  BP 135/85   Ht 5\' 5"  (1.651 m)   Wt 213 lb (96.6 kg)   BMI 35.45 kg/m      12/17/2023    1:24 PM 07/18/2023   10:46 AM 04/12/2023   11:31 AM  BP/Weight  Systolic BP 135 138 129  Diastolic BP 85 65 86  Wt. (Lbs) 213 209 202  BMI 35.45 kg/m2 34.78 kg/m2 33.61 kg/m2    Physical Exam Vitals and nursing note reviewed.  Constitutional:      Appearance: Normal appearance. She is obese.  HENT:     Head: Normocephalic and atraumatic.  Cardiovascular:     Rate and Rhythm: Normal rate and regular rhythm.  Pulmonary:     Effort: Pulmonary effort is  normal.     Breath sounds: Normal breath sounds. No wheezing, rhonchi or rales.  Skin:    Comments: No appreciable hair loss or hair thinning.  Normal-appearing nails.  Neurological:     Mental Status: She is alert.  Psychiatric:        Mood and Affect: Mood normal.        Behavior: Behavior normal.     Lab Results  Component Value Date   WBC 8.5 01/15/2023   HGB 13.9 01/15/2023   HCT 42.4 01/15/2023   PLT 254 01/15/2023   GLUCOSE 84 01/15/2023   CHOL 257 (H) 01/15/2023   TRIG 69 01/15/2023   HDL 108 01/15/2023   LDLCALC 138 (H) 01/15/2023   ALT 13 01/15/2023   AST 20 01/15/2023   NA 143 01/15/2023   K 4.6 01/15/2023   CL 105 01/15/2023   CREATININE 0.76 01/15/2023   BUN 21 01/15/2023   CO2 25 01/15/2023   TSH 2.630 11/08/2017   INR 1.08 11/23/2018     Assessment & Plan:  Fatigue, unspecified type Assessment & Plan: Patient's constellation of symptoms concerning for thyroid disease.  Labs today including thyroid studies.  Orders: -     CBC -     TSH + free T4 -     T3, free  Hyperlipidemia, unspecified hyperlipidemia type Assessment & Plan: Lipid panel today to reassess.  Continue Crestor.  May need dose adjustment pending results.  Orders: -     Lipid panel  Essential hypertension Assessment & Plan: Stable.  Continue enalapril.  Orders: -     CMP14+EGFR  Chronic daily headache Assessment & Plan: Patient reports improvement after starting magnesium.   Age-related osteoporosis without current pathological fracture Assessment & Plan: DEXA scan to reassess.  Continue Fosamax.  Orders: -     Alendronate Sodium; Take with a full glass of water on an empty stomach.  Dispense: 12 tablet; Refill: 3 -     DG Bone Density    Follow-up:  Has scheduled follow.   Everlene Other DO [facility]

## 2023-12-17 NOTE — Patient Instructions (Signed)
Labs ordered.  Continue your medications.  We will call or message with results.  Take care  Dr. Adriana Simas

## 2023-12-17 NOTE — Assessment & Plan Note (Signed)
Lipid panel today to reassess.  Continue Crestor.  May need dose adjustment pending results.

## 2023-12-18 LAB — CMP14+EGFR
ALT: 19 [IU]/L (ref 0–32)
AST: 23 [IU]/L (ref 0–40)
Albumin: 4.3 g/dL (ref 3.9–4.9)
Alkaline Phosphatase: 89 [IU]/L (ref 44–121)
BUN/Creatinine Ratio: 31 — ABNORMAL HIGH (ref 12–28)
BUN: 27 mg/dL (ref 8–27)
Bilirubin Total: 0.4 mg/dL (ref 0.0–1.2)
CO2: 27 mmol/L (ref 20–29)
Calcium: 10.7 mg/dL — ABNORMAL HIGH (ref 8.7–10.3)
Chloride: 103 mmol/L (ref 96–106)
Creatinine, Ser: 0.86 mg/dL (ref 0.57–1.00)
Globulin, Total: 2.1 g/dL (ref 1.5–4.5)
Glucose: 85 mg/dL (ref 70–99)
Potassium: 4.8 mmol/L (ref 3.5–5.2)
Sodium: 140 mmol/L (ref 134–144)
Total Protein: 6.4 g/dL (ref 6.0–8.5)
eGFR: 73 mL/min/{1.73_m2} (ref >59)

## 2023-12-18 LAB — CBC
Hematocrit: 40.7 % (ref 34.0–46.6)
Hemoglobin: 13.8 g/dL (ref 11.1–15.9)
MCH: 32.8 pg (ref 26.6–33.0)
MCHC: 33.9 g/dL (ref 31.5–35.7)
MCV: 97 fL (ref 79–97)
Platelets: 255 10*3/uL (ref 150–450)
RBC: 4.21 x10E6/uL (ref 3.77–5.28)
RDW: 12.1 % (ref 11.7–15.4)
WBC: 7.9 10*3/uL (ref 3.4–10.8)

## 2023-12-18 LAB — LIPID PANEL
Chol/HDL Ratio: 1.9 {ratio} (ref 0.0–4.4)
Cholesterol, Total: 196 mg/dL (ref 100–199)
HDL: 105 mg/dL (ref >39)
LDL Chol Calc (NIH): 75 mg/dL (ref 0–99)
Triglycerides: 91 mg/dL (ref 0–149)
VLDL Cholesterol Cal: 16 mg/dL (ref 5–40)

## 2023-12-18 LAB — T3, FREE: T3, Free: 2.3 pg/mL (ref 2.0–4.4)

## 2023-12-18 LAB — TSH+FREE T4
Free T4: 1.27 ng/dL (ref 0.82–1.77)
TSH: 2.63 u[IU]/mL (ref 0.450–4.500)

## 2023-12-29 ENCOUNTER — Encounter: Payer: Self-pay | Admitting: Family Medicine

## 2023-12-30 ENCOUNTER — Other Ambulatory Visit: Payer: Self-pay

## 2024-01-16 ENCOUNTER — Ambulatory Visit (INDEPENDENT_AMBULATORY_CARE_PROVIDER_SITE_OTHER): Payer: PPO | Admitting: Family Medicine

## 2024-01-16 VITALS — BP 124/74 | HR 54 | Temp 97.3°F | Ht 65.0 in | Wt 215.4 lb

## 2024-01-16 DIAGNOSIS — E785 Hyperlipidemia, unspecified: Secondary | ICD-10-CM

## 2024-01-16 DIAGNOSIS — I10 Essential (primary) hypertension: Secondary | ICD-10-CM

## 2024-01-16 DIAGNOSIS — F418 Other specified anxiety disorders: Secondary | ICD-10-CM

## 2024-01-16 DIAGNOSIS — Z8673 Personal history of transient ischemic attack (TIA), and cerebral infarction without residual deficits: Secondary | ICD-10-CM | POA: Diagnosis not present

## 2024-01-16 MED ORDER — ESCITALOPRAM OXALATE 20 MG PO TABS: 20.0000 mg | ORAL_TABLET | Freq: Every day | ORAL | 3 refills | Status: AC

## 2024-01-16 MED ORDER — ENALAPRIL MALEATE 20 MG PO TABS: 20.0000 mg | ORAL_TABLET | Freq: Two times a day (BID) | ORAL | 3 refills | Status: AC

## 2024-01-16 NOTE — Assessment & Plan Note (Signed)
Improved on Crestor.  Continue.

## 2024-01-16 NOTE — Assessment & Plan Note (Signed)
 BP stable.  Continue current medications.

## 2024-01-16 NOTE — Patient Instructions (Signed)
 Restart Aspirin.  Lab today.  Follow up in 6 months.

## 2024-01-16 NOTE — Progress Notes (Signed)
 Subjective:  Patient ID: Jennifer Coleman, female    DOB: September 09, 1951  Age: 73 y.o. MRN: [id number]  CC:  Follow up   HPI:  73 year old female with the below mentioned medical problems presents for follow up.  Patient states that she is doing well.   Recent labs revealed mild hypercalcemia. Needs repeat and PTH today.  LDL significantly improved with Crestor. She is pleased with this.   BP stable on Enalapril.   Patient Active Problem List   Diagnosis Date Noted  . Hypercalcemia 01/16/2024  . History of stroke 01/16/2024  . Hyperlipidemia 11/22/2021  . Chronic midline low back pain with left-sided sciatica 11/22/2021  . Chronic daily headache 11/22/2021  . Osteoporosis 12/30/2019  . 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 Depression with anxiety 11/05/2017  . Essential hypertension 09/12/2014    Social Hx   Social History   Socioeconomic History  . Marital status: Married    Spouse name: Not on file  . Number of children: Not on file  . Years of education: Not on file  . Highest education level: Not on file  Occupational History  . Occupation: Insurance underwriter  Tobacco Use  . Smoking status: Former    Current packs/day: 0.00    Types: Cigarettes    Quit date: 09/22/1992    Years since quitting: 31.3  . Smokeless tobacco: Never  Vaping Use  . Vaping status: Never Used  Substance and Sexual Activity  . Alcohol use: No    Alcohol/week: 0.0 standard drinks of alcohol  . Drug use: No  . Sexual activity: Not Currently  Other Topics Concern  . Not on file  Social History Narrative   Lives with husband in [place], [place]   Right handed   Social Drivers of Health   Financial Resource Strain: Low Risk  (04/12/2023)   Overall Financial Resource Strain (CARDIA)   . Difficulty of Paying Living Expenses: Not hard at all  Food Insecurity: No Food Insecurity (04/12/2023)   Hunger Vital Sign   . Worried About Programme researcher, broadcasting/film/video in the Last Year: Never true   . Ran Out of  Food in the Last Year: Never true  Transportation Needs: No Transportation Needs (04/12/2023)   PRAPARE - Transportation   . Lack of Transportation (Medical): No   . Lack of Transportation (Non-Medical): No  Physical Activity: Sufficiently Active (04/12/2023)   Exercise Vital Sign   . Days of Exercise per Week: 5 days   . Minutes of Exercise per Session: 30 min  Stress: No Stress Concern Present (04/12/2023)   [employer] of Occupational Health - Occupational Stress Questionnaire   . Feeling of Stress : Not at all  Social Connections: Socially Integrated (04/12/2023)   Social Connection and Isolation Panel [NHANES]   . Frequency of Communication with Friends and Family: More than three times a week   . Frequency of Social Gatherings with Friends and Family: More than three times a week   . Attends Religious Services: More than 4 times per year   . Active Member of Clubs or Organizations: Yes   . Attends Banker Meetings: More than 4 times per year   . Marital Status: Married    Review of Systems  Constitutional:  Positive for fatigue.  Respiratory: Negative.    Cardiovascular: Negative.     Objective:  BP 124/74   Pulse (!) 54   Temp (!) 97.3 F (36.3 C)   Ht 5\' 5"  (  1.651 m)   Wt 215 lb 6.4 oz (97.7 kg)   SpO2 98%   BMI 35.84 kg/m      01/16/2024   11:02 AM 12/17/2023    1:24 PM 07/18/2023   10:46 AM  BP/Weight  Systolic BP 124 135 138  Diastolic BP 74 85 65  Wt. (Lbs) 215.4 213 209  BMI 35.84 kg/m2 35.45 kg/m2 34.78 kg/m2    Physical Exam Vitals and nursing note reviewed.  Constitutional:      General: She is not in acute distress.    Appearance: Normal appearance.  HENT:     Head: Normocephalic and atraumatic.  Eyes:     General:        Right eye: No discharge.        Left eye: No discharge.     Conjunctiva/sclera: Conjunctivae normal.  Cardiovascular:     Rate and Rhythm: Normal rate and regular rhythm.  Pulmonary:     Effort: Pulmonary  effort is normal.     Breath sounds: Normal breath sounds. No wheezing, rhonchi or rales.  Neurological:     Mental Status: She is alert.  Psychiatric:        Mood and Affect: Mood normal.        Behavior: Behavior normal.   Lab Results  Component Value Date   WBC 7.9 12/17/2023   HGB 13.8 12/17/2023   HCT 40.7 12/17/2023   PLT 255 12/17/2023   GLUCOSE 85 12/17/2023   CHOL 196 12/17/2023   TRIG 91 12/17/2023   HDL 105 12/17/2023   LDLCALC 75 12/17/2023   ALT 19 12/17/2023   AST 23 12/17/2023   NA 140 12/17/2023   K 4.8 12/17/2023   CL 103 12/17/2023   CREATININE 0.86 12/17/2023   BUN 27 12/17/2023   CO2 27 12/17/2023   TSH 2.630 12/17/2023   INR 1.08 11/23/2018     Assessment & Plan:  History of stroke  Hypercalcemia Assessment & Plan: Calcium and PTH testing today.  Orders: -     PTH, intact and calcium  Essential hypertension Assessment & Plan: BP stable. Continue current medications.  Orders: -     Enalapril Maleate; Take 1 tablet (20 mg total) by mouth 2 (two) times daily.  Dispense: 180 tablet; Refill: 3  Depression with anxiety -     Escitalopram Oxalate; Take 1 tablet (20 mg total) by mouth daily.  Dispense: 90 tablet; Refill: 3  Hyperlipidemia, unspecified hyperlipidemia type Assessment & Plan: Improved on Crestor. Continue.     Follow-up:  6 months  Louvenia Golomb Adriana Simas DO [facility]

## 2024-01-16 NOTE — Assessment & Plan Note (Signed)
 Calcium and PTH testing today.

## 2024-01-17 LAB — PTH, INTACT AND CALCIUM
Calcium: 10.4 mg/dL — ABNORMAL HIGH (ref 8.7–10.3)
PTH: 59 pg/mL (ref 15–65)

## 2024-01-20 ENCOUNTER — Ambulatory Visit (HOSPITAL_COMMUNITY)
Admission: RE | Admit: 2024-01-20 | Discharge: 2024-01-20 | Disposition: A | Discharge status: Home / Self Care | Source: Ambulatory Visit | Attending: Family Medicine | Admitting: Family Medicine

## 2024-01-20 DIAGNOSIS — M810 Age-related osteoporosis without current pathological fracture: Secondary | ICD-10-CM | POA: Insufficient documentation

## 2024-01-20 DIAGNOSIS — Z780 Asymptomatic menopausal state: Secondary | ICD-10-CM | POA: Diagnosis not present

## 2024-01-21 ENCOUNTER — Encounter: Payer: Self-pay | Admitting: Family Medicine

## 2024-01-22 ENCOUNTER — Other Ambulatory Visit: Payer: Self-pay | Admitting: Family Medicine

## 2024-01-22 DIAGNOSIS — M810 Age-related osteoporosis without current pathological fracture: Secondary | ICD-10-CM

## 2024-01-23 ENCOUNTER — Telehealth: Payer: Self-pay

## 2024-01-23 ENCOUNTER — Other Ambulatory Visit: Payer: Self-pay

## 2024-01-23 DIAGNOSIS — M810 Age-related osteoporosis without current pathological fracture: Secondary | ICD-10-CM

## 2024-01-23 NOTE — Telephone Encounter (Signed)
 Spoke with patient and informed new endocrinology referral is placed

## 2024-01-23 NOTE — Telephone Encounter (Signed)
 Patient called about the referral that was put in for orthopedics for osteoporosis, she would like to go to endocrinology if appropriate. Please advise

## 2024-01-23 NOTE — Telephone Encounter (Signed)
 Pt needs a referral to endocrinologist she already has a orthopedic   Pt call back is [phone]

## 2024-01-24 NOTE — Progress Notes (Signed)
 New endocrinology referral placed, pt aware

## 2024-01-29 ENCOUNTER — Ambulatory Visit: Admitting: Family Medicine

## 2024-01-29 VITALS — BP 132/86 | HR 60 | Temp 98.4°F | Ht 65.0 in | Wt 209.0 lb

## 2024-01-29 DIAGNOSIS — J988 Other specified respiratory disorders: Secondary | ICD-10-CM | POA: Diagnosis not present

## 2024-01-29 MED ORDER — PROMETHAZINE-DM 6.25-15 MG/5ML PO SYRP: 5.0000 mL | ORAL_SOLUTION | Freq: Four times a day (QID) | ORAL | 0 refills | Status: DC | PRN

## 2024-01-29 MED ORDER — DOXYCYCLINE HYCLATE 100 MG PO TABS: 100.0000 mg | ORAL_TABLET | Freq: Two times a day (BID) | ORAL | 0 refills | Status: DC

## 2024-01-29 NOTE — Assessment & Plan Note (Signed)
?    Recent influenza.  Out of the treatment window. Treating with doxycycline and Promethazine DM.

## 2024-01-29 NOTE — Progress Notes (Signed)
 Subjective:  Patient ID: Jennifer Coleman, female    DOB: Mar 03, 1951  Age: 73 y.o. MRN: [id number]  CC:   Chief Complaint  Patient presents with   Sore Throat    Sore throat, cough, ear pain, congestion and fever xs 4 days    HPI:  73 year old female presents with respiratory symptoms.  Started 4 days ago.  She reports sore throat, ear pain, congestion, cough, body aches, and initial fever.  Fever was as high as 101.8 on Monday.  Fever has since resolved.  Denies any sick contacts.  Has used albuterol in over the counter medications without resolution.  Cough seems to be the most troubling symptom.  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   Depression with anxiety 11/05/2017   Essential hypertension 09/12/2014    Social Hx   Social History   Socioeconomic History   Marital status: Married    Spouse name: Not on file   Number of children: Not on file   Years of education: Not on file   Highest education level: Not on file  Occupational History   Occupation: Insurance underwriter  Tobacco Use   Smoking status: Former    Current packs/day: 0.00    Types: Cigarettes    Quit date: 09/22/1992    Years since quitting: 31.3   Smokeless tobacco: Never  Vaping Use   Vaping status: Never Used  Substance and Sexual Activity   Alcohol use: No    Alcohol/week: 0.0 standard drinks of alcohol   Drug use: No   Sexual activity: Not Currently  Other Topics Concern   Not on file  Social History Narrative   Lives with husband in [place], [place]   Right handed   Social Drivers of Health   Financial Resource Strain: Low Risk  (04/12/2023)   Overall Financial Resource Strain (CARDIA)     Difficulty of Paying Living Expenses: Not hard at all  Food Insecurity: No Food Insecurity (04/12/2023)   Hunger Vital Sign    Worried About Running Out of Food in the Last Year: Never true    Ran Out of Food in the Last Year: Never true  Transportation Needs: No Transportation Needs (04/12/2023)   PRAPARE - Administrator, Civil Service (Medical): No    Lack of Transportation (Non-Medical): No  Physical Activity: Sufficiently Active (04/12/2023)   Exercise Vital Sign    Days of Exercise per Week: 5 days    Minutes of Exercise per Session: 30 min  Stress: No Stress Concern Present (04/12/2023)   [employer] of Occupational Health - Occupational Stress Questionnaire    Feeling of Stress : Not at all  Social Connections: Socially Integrated (04/12/2023)   Social Connection and Isolation Panel [NHANES]    Frequency of Communication with Friends and Family: More than three times a week    Frequency of Social Gatherings with Friends and Family: More than three times a week    Attends Religious Services: More than 4 times per year    Active Member of [employer] or Organizations: Yes    Attends Engineer, structural: More than 4 times per year    Marital Status: Married    Review of  Systems Per HPI  Objective:  BP 132/86   Pulse 60   Temp 98.4 F (36.9 C)   Ht 5\' 5"  (1.651 m)   Wt 209 lb (94.8 kg)   SpO2 97%   BMI 34.78 kg/m      01/29/2024   11:36 AM 01/16/2024   11:02 AM 12/17/2023    1:24 PM  BP/Weight  Systolic BP 132 124 135  Diastolic BP 86 74 85  Wt. (Lbs) 209 215.4 213  BMI 34.78 kg/m2 35.84 kg/m2 35.45 kg/m2    Physical Exam Vitals and nursing note reviewed.  Constitutional:      General: She is not in acute distress.    Appearance: Normal appearance.  HENT:     Head: Normocephalic and atraumatic.     Ears:     Comments: TMs without evidence of infection bilaterally.    Mouth/Throat:     Pharynx: Posterior oropharyngeal erythema present.   Cardiovascular:     Rate and Rhythm: Normal rate and regular rhythm.  Pulmonary:     Effort: Pulmonary effort is normal.     Breath sounds: Normal breath sounds. No wheezing or rales.  Neurological:     Mental Status: She is alert.     Lab Results  Component Value Date   WBC 7.9 12/17/2023   HGB 13.8 12/17/2023   HCT 40.7 12/17/2023   PLT 255 12/17/2023   GLUCOSE 85 12/17/2023   CHOL 196 12/17/2023   TRIG 91 12/17/2023   HDL 105 12/17/2023   LDLCALC 75 12/17/2023   ALT 19 12/17/2023   AST 23 12/17/2023   NA 140 12/17/2023   K 4.8 12/17/2023   CL 103 12/17/2023   CREATININE 0.86 12/17/2023   BUN 27 12/17/2023   CO2 27 12/17/2023   TSH 2.630 12/17/2023   INR 1.08 11/23/2018     Assessment & Plan:  Respiratory infection Assessment & Plan: ?  Recent influenza.  Out of the treatment window. Treating with doxycycline and Promethazine DM.   Other orders -     Doxycycline Hyclate; Take 1 tablet (100 mg total) by mouth 2 (two) times daily.  Dispense: 14 tablet; Refill: 0 -     Promethazine-DM; Take 5 mLs by mouth 4 (four) times daily as needed for cough.  Dispense: 118 mL; Refill: 0    Follow-up:  Return if symptoms worsen or fail to improve.  Everlene Other DO [facility]

## 2024-02-18 DIAGNOSIS — M1711 Unilateral primary osteoarthritis, right knee: Secondary | ICD-10-CM | POA: Diagnosis not present

## 2024-02-18 DIAGNOSIS — M25561 Pain in right knee: Secondary | ICD-10-CM | POA: Diagnosis not present

## 2024-03-13 ENCOUNTER — Encounter (HOSPITAL_COMMUNITY): Payer: Self-pay | Admitting: *Deleted

## 2024-04-03 DIAGNOSIS — Z85828 Personal history of other malignant neoplasm of skin: Secondary | ICD-10-CM | POA: Diagnosis not present

## 2024-04-03 DIAGNOSIS — L814 Other melanin hyperpigmentation: Secondary | ICD-10-CM | POA: Diagnosis not present

## 2024-04-03 DIAGNOSIS — D0472 Carcinoma in situ of skin of left lower limb, including hip: Secondary | ICD-10-CM | POA: Diagnosis not present

## 2024-04-03 DIAGNOSIS — Z85820 Personal history of malignant melanoma of skin: Secondary | ICD-10-CM | POA: Diagnosis not present

## 2024-04-03 DIAGNOSIS — L821 Other seborrheic keratosis: Secondary | ICD-10-CM | POA: Diagnosis not present

## 2024-04-03 DIAGNOSIS — L570 Actinic keratosis: Secondary | ICD-10-CM | POA: Diagnosis not present

## 2024-04-17 ENCOUNTER — Ambulatory Visit: Payer: PPO

## 2024-04-17 VITALS — Ht 65.0 in | Wt 209.0 lb

## 2024-04-17 DIAGNOSIS — Z0000 Encounter for general adult medical examination without abnormal findings: Secondary | ICD-10-CM | POA: Diagnosis not present

## 2024-04-17 NOTE — Patient Instructions (Signed)
 Ms. Jennifer Coleman , Thank you for taking time out of your busy schedule to complete your Annual Wellness Visit with me. I enjoyed our conversation and look forward to speaking with you again next year. I, as well as your care team,  appreciate your ongoing commitment to your health goals. Please review the following plan we discussed and let me know if I can assist you in the future. Your Game plan/ To Do List    Follow up Visits: Next Medicare AWV with our clinical staff: In 1 year    Have you seen your provider in the last 6 months (3 months if uncontrolled diabetes)? Yes Next Office Visit with your provider: 07/16/24 @ 1:00  Clinician Recommendations:  Aim for 30 minutes of exercise or brisk walking, 6-8 glasses of water , and 5 servings of fruits and vegetables each day.       This is a list of the screening recommended for you and due dates:  Health Maintenance  Topic Date Due   COVID-19 Vaccine (4 - 2024-25 season) 07/10/2024*   Flu Shot  05/29/2024   Medicare Annual Wellness Visit  04/17/2025   Mammogram  09/08/2025   DTaP/Tdap/Td vaccine (2 - Td or Tdap) 03/01/2026   Colon Cancer Screening  06/07/2026   Pneumococcal Vaccine for age over 31  Completed   DEXA scan (bone density measurement)  Completed   Hepatitis C Screening  Completed   Zoster (Shingles) Vaccine  Completed   HPV Vaccine  Aged Out   Meningitis B Vaccine  Aged Out  *Topic was postponed. The date shown is not the original due date.    Advanced directives: (ACP Link)Information on Advanced Care Planning can be found at [place]  Secretary of [facility] Advance Health Care Directives Advance Health Care Directives. [email or web address]   Advance Care Planning is important because it:  [x]  Makes sure you receive the medical care that is consistent with your values, goals, and preferences  [x]  It provides guidance to your family and loved ones and reduces their decisional burden about whether or not they are making the right decisions  based on your wishes.  Follow the link provided in your after visit summary or read over the paperwork we have mailed to you to help you started getting your Advance Directives in place. If you need assistance in completing these, please reach out to us  so that we can help you!  See attachments for Preventive Care and Fall Prevention Tips.

## 2024-04-17 NOTE — Progress Notes (Signed)
 Subjective:   Jennifer Coleman is a 73 y.o. who presents for a Medicare Wellness preventive visit.  As a reminder, Annual Wellness Visits don't include a physical exam, and some assessments may be limited, especially if this visit is performed virtually. We may recommend an in-person follow-up visit with your provider if needed.  Visit Complete: Virtual I connected with  Jennifer Coleman on 04/17/24 by a audio enabled telemedicine application and verified that I am speaking with the correct person using two identifiers.  Patient Location: Home  Provider Location: Home Office  I discussed the limitations of evaluation and management by telemedicine. The patient expressed understanding and agreed to proceed.  Vital Signs: Because this visit was a virtual/telehealth visit, some criteria may be missing or patient reported. Any vitals not documented were not able to be obtained and vitals that have been documented are patient reported.  VideoDeclined- This patient declined Librarian, academic. Therefore the visit was completed with audio only.  Persons Participating in Visit: Patient.  AWV Questionnaire: No: Patient Medicare AWV questionnaire was not completed prior to this visit.  Cardiac Risk Factors include: advanced age (>70men, >73 women);dyslipidemia;hypertension     Objective:    Today's Vitals   04/17/24 1044  Weight: 209 lb (94.8 kg)  Height: 5' 5 (1.651 m)   Body mass index is 34.78 kg/m.     04/17/2024   10:48 AM 04/12/2023   11:38 AM 04/03/2022    9:50 AM 11/22/2021   12:25 PM 08/17/2019   11:35 AM 08/13/2019    2:30 PM 12/30/2018    2:45 PM  Advanced Directives  Does Patient Have a Medical Advance Directive? No No No No No No No   Would patient like information on creating a medical advance directive? Yes (MAU/Ambulatory/Procedural Areas - Information given) No - Patient declined No - Patient declined  No - Patient declined No - Patient declined  No - Patient declined      Data saved with a previous flowsheet row definition    Current Medications (verified) Outpatient Encounter Medications as of 04/17/2024  Medication Sig   alendronate  (FOSAMAX ) 70 MG tablet Take with a full glass of water  on an empty stomach.   aspirin  EC 81 MG tablet Take 81 mg by mouth daily. Swallow whole.   cetirizine  (ZYRTEC ) 10 MG tablet Take 1 tablet (10 mg total) by mouth daily.   doxycycline  (VIBRA -TABS) 100 MG tablet Take 1 tablet (100 mg total) by mouth 2 (two) times daily.   enalapril  (VASOTEC ) 20 MG tablet Take 1 tablet (20 mg total) by mouth 2 (two) times daily.   escitalopram  (LEXAPRO ) 20 MG tablet Take 1 tablet (20 mg total) by mouth daily.   fluticasone  (FLONASE ) 50 MCG/ACT nasal spray Place 2 sprays into both nostrils daily.   Multiple Vitamins-Minerals (ONE-A-DAY WOMENS 50 PLUS PO) Take 1 tablet by mouth daily.   PROAIR  HFA 108 (90 Base) MCG/ACT inhaler INHALE TWO PUFFS BY MOUTH EVERY 4 TO 6 HOURS AS NEEDED FOR FOR WHEEZING   promethazine -dextromethorphan (PROMETHAZINE -DM) 6.25-15 MG/5ML syrup Take 5 mLs by mouth 4 (four) times daily as needed for cough.   rosuvastatin  (CRESTOR ) 10 MG tablet TAKE 1 TABLET BY MOUTH ONCE DAILY.   No facility-administered encounter medications on file as of 04/17/2024.    Allergies (verified) Penicillins, Shellfish allergy, Gabapentin , Levaquin  [levofloxacin ], Lyrica [pregabalin], Codeine, and Morphine  and codeine   History: Past Medical History:  Diagnosis Date   Anxiety    Arthritis  Basal cell carcinoma 02/02/2016   Left auricle - MOHs   Dysrhythmia    PVCs   Family history of anesthesia complication 25 yrs ago   Sister allergic to sccinocholine - unable to come off ventilator   Hypertension    Lumbar herniated disc    Pneumonia    PONV (postoperative nausea and vomiting)    Premature ventricular contractions    SCC (squamous cell carcinoma) 05/09/2021   in situ- left anterior neck   SCC  (squamous cell carcinoma) 05/09/2021   in situ- left upper arm-posterior (EXC)   SCC (squamous cell carcinoma) 05/09/2021   in situ- right inner lower leg   Scoliosis    Shingles    Squamous cell carcinoma of skin 11/14/2011   right cheek - CX3 + 5FU   Squamous cell carcinoma of skin 11/14/2011   right canthus, superior - CX3 + 5FU   Squamous cell carcinoma of skin 11/14/2011   left cheek - CX3 + 5FU   Squamous cell carcinoma of skin 07/26/2015   right forearm - CX3 + 5FU   Squamous cell carcinoma of skin 02/02/2016   left tip shoulder - CX3 + cautery + 5FU    Squamous cell carcinoma of skin 07/17/2016   left post shoulder - tx after biopsy   Squamous cell carcinoma of skin 07/17/2016   right low shin - tx after biopsy   Squamous cell carcinoma of skin 03/13/2019   left post shoulder - tx after biopsy   Squamous cell carcinoma of skin 03/13/2019   right inner ankle - tx after biopsy   Squamous cell carcinoma of skin 09/23/2019   left cheek - tx after biopsy + MOHs (Dr. Gideon Kussmaul)   Squamous cell carcinoma of skin 09/23/2019   right upper shin, medial - CX3 + 5FU   Squamous cell carcinoma of skin 09/23/2019   right upper shin, lateral - CX3 + 5FU   Varicose veins    Varicose veins of left lower extremity    Past Surgical History:  Procedure Laterality Date   CARPAL TUNNEL RELEASE     CERVICAL DISC SURGERY  1992   Fusion   CHOLECYSTECTOMY N/A 11/21/2018   Procedure: LAPAROSCOPIC CHOLECYSTECTOMY;  Surgeon: Awilda Bogus, MD;  Location: [facility];  Service: General;  Laterality: N/A;   COLONOSCOPY N/A 06/07/2016   Procedure: COLONOSCOPY;  Surgeon: Ruby Corporal, MD;  Location: [facility];  Service: Endoscopy;  Laterality: N/A;  1:00   KNEE ARTHROSCOPY  09/16/2012   Procedure: ARTHROSCOPY KNEE;  Surgeon: Shirlee Dotter, MD;  Location: [facility] OR;  Service: Orthopedics;  Laterality: Right;  Right Knee Arthroscopy   LAPAROSCOPIC GASTRIC SLEEVE RESECTION N/A 08/17/2019    Procedure: LAPAROSCOPIC GASTRIC SLEEVE RESECTION AND HIATAL HERNIA REPAIR, UPPPER ENDO AND ERAS PATHWAY;  Surgeon: Jacolyn Matar, MD;  Location: [facility];  Service: General;  Laterality: N/A;   SALPINGOOPHORECTOMY Left 1985   SHOULDER ARTHROSCOPY WITH OPEN ROTATOR CUFF REPAIR AND DISTAL CLAVICLE ACROMINECTOMY Right 05/03/2016   Procedure: RIGHT SHOULDER ARTHROSCOPY WITH MINI-OPEN ROTATOR CUFF REPAIR,DISTAL CLAVICLE RESECTION, SUBACROMIAL DECOMPRESSION.;  Surgeon: Shirlee Dotter, MD;  Location: [facility];  Service: Orthopedics;  Laterality: Right;   SHOULDER ARTHROSCOPY WITH ROTATOR CUFF REPAIR Right 09/25/2016   Procedure: Right Shoulder Manipulation, Arthroscopic Debridement of Joint, Removal of Loose Body and Re-Repair of Rotator Cuff;  Surgeon: Shirlee Dotter, MD;  Location: [facility] OR;  Service: Orthopedics;  Laterality: Right;   Family History  Problem Relation Age  of Onset   Heart disease Sister    Heart disease Brother        31's   Diabetes Mellitus II Sister    Multiple myeloma Mother    Hypertension Sister    Hypertension Father    Heart attack Father    Social History   Socioeconomic History   Marital status: Married    Spouse name: Not on file   Number of children: Not on file   Years of education: Not on file   Highest education level: Not on file  Occupational History   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  Tobacco Use   Smoking status: Former    Current packs/day: 0.00    Types: Cigarettes    Quit date: 09/22/1992    Years since quitting: 31.5   Smokeless tobacco: Never  Vaping Use   Vaping status: Never Used  Substance and Sexual Activity   Alcohol use: No    Alcohol/week: 0.0 standard drinks of alcohol   Drug use: No   Sexual activity: Not Currently  Other Topics Concern   Not on file  Social History Narrative   Lives with husband in [place], [place]   Right handed   Social Drivers of Health   Financial Resource Strain: Low Risk  (04/17/2024)   Overall  Financial Resource Strain (CARDIA)    Difficulty of Paying Living Expenses: Not hard at all  Food Insecurity: No Food Insecurity (04/17/2024)   Hunger Vital Sign    Worried About Running Out of Food in the Last Year: Never true    Ran Out of Food in the Last Year: Never true  Transportation Needs: No Transportation Needs (04/17/2024)   PRAPARE - Administrator, Civil Service (Medical): No    Lack of Transportation (Non-Medical): No  Physical Activity: Insufficiently Active (04/17/2024)   Exercise Vital Sign    Days of Exercise per Week: 3 days    Minutes of Exercise per Session: 30 min  Stress: No Stress Concern Present (04/17/2024)   [employer] of Occupational Health - Occupational Stress Questionnaire    Feeling of Stress: Not at all  Social Connections: Socially Integrated (04/17/2024)   Social Connection and Isolation Panel    Frequency of Communication with Friends and Family: More than three times a week    Frequency of Social Gatherings with Friends and Family: More than three times a week    Attends Religious Services: More than 4 times per year    Active Member of [employer] or Organizations: Yes    Attends Engineer, structural: More than 4 times per year    Marital Status: Married    Tobacco Counseling Counseling given: Not Answered    Clinical Intake:  Pre-visit preparation completed: Yes  Pain : No/denies pain     Diabetes: No  No results found for: HGBA1C   How often do you need to have someone help you when you read instructions, pamphlets, or other written materials from your doctor or pharmacy?: 1 - Never  Interpreter Needed?: No  Information entered by :: Seabron Cypress LPN   Activities of Daily Living     04/17/2024   10:48 AM  In your present state of health, do you have any difficulty performing the following activities:  Hearing? 0  Vision? 0  Difficulty concentrating or making decisions? 0  Walking or climbing stairs?  0  Dressing or bathing? 0  Doing errands, shopping? 0  Preparing Food and eating ? N  Using  the Toilet? N  In the past six months, have you accidently leaked urine? N  Do you have problems with loss of bowel control? N  Managing your Medications? N  Managing your Finances? N  Housekeeping or managing your Housekeeping? N    Patient Care Team: Cook, Jayce G, DO as PCP - General (Family Medicine) Devon Fogo, MD (Inactive) as Consulting Physician (Dermatology) Dorthey Gave, PA-C as Physician Assistant (Dermatology) Dr [facility], OD Gaynelle Keeling, MD as Consulting Physician (Dermatology) Hazle Lites, MD as Consulting Physician (Orthopedic Surgery)  I have updated your Care Teams any recent Medical Services you may have received from other providers in the past year.     Assessment:   This is a routine wellness examination for Jennifer Coleman.  Hearing/Vision screen Hearing Screening - Comments:: Denies hearing difficulties   Vision Screening - Comments:: up to date with routine eye exams with Dr. Patria Bookbinder    Goals Addressed             This Visit's Progress    Remain active and independent   On track      Depression Screen     04/17/2024   10:47 AM 01/16/2024   11:08 AM 12/17/2023    1:25 PM 04/12/2023   11:37 AM 02/18/2023    4:24 PM 02/18/2023    4:23 PM 04/03/2022    9:39 AM  PHQ 2/9 Scores  PHQ - 2 Score 0 0 0 0 0 0 0  PHQ- 9 Score  1 3 0 0      Fall Risk     04/17/2024   10:48 AM 01/16/2024   11:08 AM 04/12/2023   11:38 AM 04/03/2022    9:52 AM 04/27/2021    9:57 AM  Fall Risk   Falls in the past year? 0 0 0 1 0  Number falls in past yr: 0  0 0   Injury with Fall? 0  0 1   Risk for fall due to : No Fall Risks  No Fall Risks History of fall(s);Impaired balance/gait No Fall Risks  Follow up Falls prevention discussed;Education provided;Falls evaluation completed  Falls prevention discussed Falls prevention discussed  Falls  evaluation completed      Data saved with a previous flowsheet row definition    MEDICARE RISK AT HOME:  Medicare Risk at Home Any stairs in or around the home?: No If so, are there any without handrails?: No Home free of loose throw rugs in walkways, pet beds, electrical cords, etc?: Yes Adequate lighting in your home to reduce risk of falls?: Yes Life alert?: No Use of a cane, walker or w/c?: No Grab bars in the bathroom?: Yes Shower chair or bench in shower?: No Elevated toilet seat or a handicapped toilet?: Yes  TIMED UP AND GO:  Was the test performed?  No  Cognitive Function: Declined/Normal: No cognitive concerns noted by patient or family. Patient alert, oriented, able to answer questions appropriately and recall recent events. No signs of memory loss or confusion.        04/12/2023   11:40 AM 04/03/2022    9:59 AM  6CIT Screen  What Year? 0 points 0 points  What month? 0 points 0 points  What time? 0 points 0 points  Count back from 20 0 points 0 points  Months in reverse 0 points 0 points  Repeat phrase 0 points 0 points  Total Score 0 points 0 points    Immunizations Immunization  History  Administered Date(s) Administered   Fluad Quad(high Dose 65+) 11/01/2022   Influenza,inj,Quad PF,6+ Mos 07/30/2019   Influenza-Unspecified 08/05/2020, 08/25/2021   PFIZER(Purple Top)SARS-COV-2 Vaccination 12/11/2019, 01/08/2020, 10/27/2020   Pneumococcal Conjugate-13 04/27/2021   Pneumococcal Polysaccharide-23 08/18/2019   Tdap 03/01/2016   Zoster Recombinant(Shingrix) 08/25/2021, 02/15/2022    Screening Tests Health Maintenance  Topic Date Due   COVID-19 Vaccine (4 - 2024-25 season) 07/10/2024 (Originally 06/30/2023)   INFLUENZA VACCINE  05/29/2024   Medicare Annual Wellness (AWV)  04/17/2025   MAMMOGRAM  09/08/2025   DTaP/Tdap/Td (2 - Td or Tdap) 03/01/2026   Colonoscopy  06/07/2026   Pneumococcal Vaccine: 50+ Years  Completed   DEXA SCAN  Completed   Hepatitis C  Screening  Completed   Zoster Vaccines- Shingrix  Completed   HPV VACCINES  Aged Out   Meningococcal B Vaccine  Aged Out    Health Maintenance  There are no preventive care reminders to display for this patient.   Additional Screening:  Vision Screening: Recommended annual ophthalmology exams for early detection of glaucoma and other disorders of the eye. Would you like a referral to an eye doctor? No    Dental Screening: Recommended annual dental exams for proper oral hygiene  Community Resource Referral / Chronic Care Management: CRR required this visit?  No   CCM required this visit?  No   Plan:    I have personally reviewed and noted the following in the patient's chart:   Medical and social history Use of alcohol, tobacco or illicit drugs  Current medications and supplements including opioid prescriptions. Patient is not currently taking opioid prescriptions. Functional ability and status Nutritional status Physical activity Advanced directives List of other physicians Hospitalizations, surgeries, and ER visits in previous 12 months Vitals Screenings to include cognitive, depression, and falls Referrals and appointments  In addition, I have reviewed and discussed with patient certain preventive protocols, quality metrics, and best practice recommendations. A written personalized care plan for preventive services as well as general preventive health recommendations were provided to patient.   Seabron Cypress [place], [place]   1/61/0960   After Visit Summary: (MyChart) Due to this being a telephonic visit, the after visit summary with patients personalized plan was offered to patient via MyChart   Notes: Nothing significant to report at this time.

## 2024-04-22 ENCOUNTER — Other Ambulatory Visit: Payer: Self-pay | Admitting: Family Medicine

## 2024-05-04 ENCOUNTER — Encounter: Payer: Self-pay | Admitting: "Endocrinology

## 2024-05-04 ENCOUNTER — Ambulatory Visit: Admitting: "Endocrinology

## 2024-05-04 VITALS — BP 130/78 | HR 64 | Ht 65.0 in | Wt 217.0 lb

## 2024-05-04 DIAGNOSIS — M810 Age-related osteoporosis without current pathological fracture: Secondary | ICD-10-CM

## 2024-05-04 NOTE — Progress Notes (Signed)
 05/04/2024      Endocrinology Consult Note  Past Medical History:  Diagnosis Date   Anxiety    Arthritis    Basal cell carcinoma 02/02/2016   Left auricle - MOHs   Dysrhythmia    PVCs   Family history of anesthesia complication 25 yrs ago   Sister allergic to sccinocholine - unable to come off ventilator   Hyperlipidemia    Hypertension    Lumbar herniated disc    Osteoporosis    Pneumonia    PONV (postoperative nausea and vomiting)    Premature ventricular contractions    SCC (squamous cell carcinoma) 05/09/2021   in situ- left anterior neck   SCC (squamous cell carcinoma) 05/09/2021   in situ- left upper arm-posterior (EXC)   SCC (squamous cell carcinoma) 05/09/2021   in situ- right inner lower leg   Scoliosis    Shingles    Squamous cell carcinoma of skin 11/14/2011   right cheek - CX3 + 5FU   Squamous cell carcinoma of skin 11/14/2011   right canthus, superior - CX3 + 5FU   Squamous cell carcinoma of skin 11/14/2011   left cheek - CX3 + 5FU   Squamous cell carcinoma of skin 07/26/2015   right forearm - CX3 + 5FU   Squamous cell carcinoma of skin 02/02/2016   left tip shoulder - CX3 + cautery + 5FU    Squamous cell carcinoma of skin 07/17/2016   left post shoulder - tx after biopsy   Squamous cell carcinoma of skin 07/17/2016   right low shin - tx after biopsy   Squamous cell carcinoma of skin 03/13/2019   left post shoulder - tx after biopsy   Squamous cell carcinoma of skin 03/13/2019   right inner ankle - tx after biopsy   Squamous cell carcinoma of skin 09/23/2019   left cheek - tx after biopsy + MOHs (Dr. Lloyd)   Squamous cell carcinoma of skin 09/23/2019   right upper shin, medial - CX3 + 5FU   Squamous cell carcinoma of skin 09/23/2019   right upper shin, lateral - CX3 + 5FU   Varicose veins    Varicose veins of left lower extremity    Past Surgical History:  Procedure Laterality Date    CARPAL TUNNEL RELEASE     CERVICAL DISC SURGERY  1992   Fusion   CHOLECYSTECTOMY N/A 11/21/2018   Procedure: LAPAROSCOPIC CHOLECYSTECTOMY;  Surgeon: Kallie Manuelita BROCKS, MD;  Location: [facility];  Service: General;  Laterality: N/A;   COLONOSCOPY N/A 06/07/2016   Procedure: COLONOSCOPY;  Surgeon: Claudis RAYMOND Rivet, MD;  Location: [facility];  Service: Endoscopy;  Laterality: N/A;  1:00   KNEE ARTHROSCOPY  09/16/2012   Procedure: ARTHROSCOPY KNEE;  Surgeon: Maude LELON Right, MD;  Location: [facility] OR;  Service: Orthopedics;  Laterality: Right;  Right Knee Arthroscopy   LAPAROSCOPIC GASTRIC SLEEVE RESECTION N/A 08/17/2019   Procedure: LAPAROSCOPIC GASTRIC SLEEVE RESECTION AND HIATAL HERNIA REPAIR, UPPPER ENDO AND ERAS PATHWAY;  Surgeon: Gladis Cough, MD;  Location: [facility];  Service: General;  Laterality: N/A;   SALPINGOOPHORECTOMY Left 1985   SHOULDER ARTHROSCOPY WITH OPEN ROTATOR CUFF REPAIR AND  DISTAL CLAVICLE ACROMINECTOMY Right 05/03/2016   Procedure: RIGHT SHOULDER ARTHROSCOPY WITH MINI-OPEN ROTATOR CUFF REPAIR,DISTAL CLAVICLE RESECTION, SUBACROMIAL DECOMPRESSION.;  Surgeon: Maude LELON Right, MD;  Location: [facility];  Service: Orthopedics;  Laterality: Right;   SHOULDER ARTHROSCOPY WITH ROTATOR CUFF REPAIR Right 09/25/2016   Procedure: Right Shoulder Manipulation, Arthroscopic Debridement of Joint, Removal of Loose Body and Re-Repair of Rotator Cuff;  Surgeon: Maude LELON Right, MD;  Location: [facility] OR;  Service: Orthopedics;  Laterality: Right;   Social History   Socioeconomic History   Marital status: Married    Spouse name: Not on file   Number of children: Not on file   Years of education: Not on file   Highest education level: Not on file  Occupational History   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  Tobacco Use   Smoking status: Former    Current packs/day: 0.00    Types: Cigarettes    Quit date: 09/22/1992    Years since quitting: 31.6   Smokeless tobacco: Never  Vaping Use    Vaping status: Never Used  Substance and Sexual Activity   Alcohol use: No    Alcohol/week: 0.0 standard drinks of alcohol   Drug use: No   Sexual activity: Not Currently  Other Topics Concern   Not on file  Social History Narrative   Lives with husband in [place], [place]   Right handed   Social Drivers of Health   Financial Resource Strain: Low Risk  (04/17/2024)   Overall Financial Resource Strain (CARDIA)    Difficulty of Paying Living Expenses: Not hard at all  Food Insecurity: No Food Insecurity (04/17/2024)   Hunger Vital Sign    Worried About Running Out of Food in the Last Year: Never true    Ran Out of Food in the Last Year: Never true  Transportation Needs: No Transportation Needs (04/17/2024)   PRAPARE - Administrator, Civil Service (Medical): No    Lack of Transportation (Non-Medical): No  Physical Activity: Insufficiently Active (04/17/2024)   Exercise Vital Sign    Days of Exercise per Week: 3 days    Minutes of Exercise per Session: 30 min  Stress: No Stress Concern Present (04/17/2024)   [employer] of Occupational Health - Occupational Stress Questionnaire    Feeling of Stress: Not at all  Social Connections: Socially Integrated (04/17/2024)   Social Connection and Isolation Panel    Frequency of Communication with Friends and Family: More than three times a week    Frequency of Social Gatherings with Friends and Family: More than three times a week    Attends Religious Services: More than 4 times per year    Active Member of [employer] or Organizations: Yes    Attends Banker Meetings: More than 4 times per year    Marital Status: Married   Outpatient Encounter Medications as of 05/04/2024  Medication Sig   Biotin 2500 MCG CHEW Chew 1 tablet by mouth daily.   diclofenac  (VOLTAREN ) 75 MG EC tablet Take 75 mg by mouth as needed.   MAGNESIUM CITRATE PO Take 1 tablet by mouth daily.   alendronate  (FOSAMAX ) 70 MG tablet Take with a full glass of  water  on an empty stomach.   aspirin  EC 81 MG tablet Take 81 mg by mouth daily. Swallow whole.   cetirizine  (ZYRTEC ) 10 MG tablet Take 1 tablet (10 mg total) by mouth daily.   doxycycline  (VIBRA -TABS) 100 MG tablet Take 1 tablet (100 mg total) by mouth  2 (two) times daily. (Patient not taking: Reported on 05/04/2024)   enalapril  (VASOTEC ) 20 MG tablet Take 1 tablet (20 mg total) by mouth 2 (two) times daily.   escitalopram  (LEXAPRO ) 20 MG tablet Take 1 tablet (20 mg total) by mouth daily.   fluticasone  (FLONASE ) 50 MCG/ACT nasal spray Place 2 sprays into both nostrils daily.   Multiple Vitamins-Minerals (ONE-A-DAY WOMENS 50 PLUS PO) Take 1 tablet by mouth daily.   PROAIR  HFA 108 (90 Base) MCG/ACT inhaler INHALE TWO PUFFS BY MOUTH EVERY 4 TO 6 HOURS AS NEEDED FOR FOR WHEEZING   promethazine -dextromethorphan (PROMETHAZINE -DM) 6.25-15 MG/5ML syrup Take 5 mLs by mouth 4 (four) times daily as needed for cough. (Patient not taking: Reported on 05/04/2024)   rosuvastatin  (CRESTOR ) 10 MG tablet TAKE 1 TABLET BY MOUTH ONCE DAILY.   No facility-administered encounter medications on file as of 05/04/2024.   ALLERGIES: Allergies  Allergen Reactions   Penicillins Shortness Of Breath, Swelling, Rash and Other (See Comments)    Did it involve swelling of the face/tongue/throat, SOB, or low BP? Yes Did it involve sudden or severe rash/hives, skin peeling, or any reaction on the inside of your mouth or nose? Yes Did you need to seek medical attention at a hospital or doctor's office? Yes-DR. office When did it last happen?  Over 10 years If all above answers are "NO", may proceed with cephalosporin use. Patient tolerates Cephalosporins    Shellfish Allergy Shortness Of Breath and Swelling   Gabapentin  Other (See Comments)    Didn't like the way the medication made her feel per pt.    Levaquin  [Levofloxacin ] Other (See Comments)    Made her feel confused    Lyrica [Pregabalin]     Pt states, the medication  made the shingles outbreak, more intense. Pt was taking for shingles pain.   Codeine Itching   Morphine  And Codeine Nausea And Vomiting    VACCINATION STATUS: Immunization History  Administered Date(s) Administered   Fluad Quad(high Dose 65+) 11/01/2022   Influenza,inj,Quad PF,6+ Mos 07/30/2019   Influenza-Unspecified 08/05/2020, 08/25/2021   PFIZER(Purple Top)SARS-COV-2 Vaccination 12/11/2019, 01/08/2020, 10/27/2020   Pneumococcal Conjugate-13 04/27/2021   Pneumococcal Polysaccharide-23 08/18/2019   Tdap 03/01/2016   Zoster Recombinant(Shingrix) 08/25/2021, 02/15/2022     HPI   Jennifer Coleman is 73 y.o. female who presents today with a medical history as above. she is being seen in consultation for osteoporosis requested by Cook, Jayce G, DO.  Patient was diagnosed with osteoporosis  approximately  5 years ago.  She was initiated on Fosamax  70 mg weekly which she has tolerated and taken for at least 4 years.  She denies any recent fragility fractures, however reports remote past bilateral wrist fractures.    No dizziness/vertigo/orthostasis. Review of her home density studies showing worsening of osteoporosis with her most recent DEXA from March 2025 showing T-score of -3.1, on femoral neck.     No h/o vitamin D  deficiency.  She notes that she has always had slightly above target serum calcium .  She was never diagnosed with hyperparathyroidism nor treatment for hypercalcemia. She has not medical history of VTE for which she underwent sleeve gastrectomy in 2021 which helped her achieve 50 pounds of weight loss.  Her other medical problems include hypertension, hyperlipidemia on treatment.  Her BMI is 36.11. She is not on any calcium  supplements other than her multivitamin daily. She is active, walks 3 to 4 miles daily, as well as some resistance training. She reports up to an  inch of height loss over the years.  She does not take high vitamin A doses. No h/o kidney stones.  No  history of thyrotoxicosis.  Her most recent thyroid  function tests were consistent with euthyroid state.   No history of CKD, nor liver disease.  Menopause was at 73 y/o.   Pt does have a FH of osteoporosis in her mother.  Review of Systems  Constitutional: + Minimally fluctuating body weight, no fatigue, no subjective hyperthermia, no subjective hypothermia, + irregular sleep pattern Eyes: no blurry vision, no xerophthalmia ENT: no sore throat, no nodules palpated in throat, no dysphagia/odynophagia, no hoarseness Cardiovascular: no Chest Pain, no Shortness of Breath, no palpitations, no leg swelling Respiratory: no cough, no SOB Gastrointestinal: no Nausea/Vomiting/Diarhhea Musculoskeletal: no muscle/joint aches Skin: no rashes Neurological: no tremors, no numbness, no tingling, no dizziness Psychiatric: no depression, no anxiety  Objective:    BP 130/78   Pulse 64   Ht 5' 5 (1.651 m)   Wt 217 lb (98.4 kg)   BMI 36.11 kg/m   Wt Readings from Last 3 Encounters:  05/04/24 217 lb (98.4 kg)  04/17/24 209 lb (94.8 kg)  01/29/24 209 lb (94.8 kg)    Physical Exam  Constitutional: + BMI of 36.11 , not in acute distress, normal state of mind Eyes: PERRLA, EOMI, no exophthalmos ENT: moist mucous membranes, no thyromegaly, no cervical lymphadenopathy Cardiovascular: normal precordial activity, Regular Rate and Rhythm, no Murmur/Rubs/Gallops Respiratory:  adequate breathing efforts, no gross chest deformity, Clear to auscultation bilaterally Gastrointestinal: abdomen soft, Non -tender, No distension, Bowel Sounds present Musculoskeletal: no gross deformities, strength intact in all four extremities Skin: moist, warm, no rashes Neurological: no tremor with outstretched hands, Deep tendon reflexes normal in all four extremities.  CMP ( most recent) CMP     Component Value Date/Time   NA 140 12/17/2023 1406   K 4.8 12/17/2023 1406   CL 103 12/17/2023 1406   CO2 27 12/17/2023  1406   GLUCOSE 85 12/17/2023 1406   GLUCOSE 88 08/13/2019 1455   BUN 27 12/17/2023 1406   CREATININE 0.86 12/17/2023 1406   CALCIUM  10.4 (H) 01/16/2024 1143   PROT 6.4 12/17/2023 1406   ALBUMIN 4.3 12/17/2023 1406   AST 23 12/17/2023 1406   ALT 19 12/17/2023 1406   ALKPHOS 89 12/17/2023 1406   BILITOT 0.4 12/17/2023 1406   EGFR 73 12/17/2023 1406   GFRNONAA 67 11/13/2019 1458      Lipid Panel ( most recent) Lipid Panel     Component Value Date/Time   CHOL 196 12/17/2023 1406   TRIG 91 12/17/2023 1406   HDL 105 12/17/2023 1406   CHOLHDL 1.9 12/17/2023 1406   LDLCALC 75 12/17/2023 1406   LABVLDL 16 12/17/2023 1406      Lab Results  Component Value Date   TSH 2.630 12/17/2023   TSH 2.630 11/08/2017   TSH 3.500 12/30/2015   TSH 2.410 03/04/2015   FREET4 1.27 12/17/2023    DualFemur Neck Left 01/20/2024 70.4 Osteoporosis -3.1 0.602 g/cm2 -5.2% - DualFemur Neck Left 11/25/2019 66.3 Osteoporosis -2.9 0.635 g/cm2 -14.7% Yes DualFemur Neck Left 07/26/2016 62.9 Osteopenia -2.1 0.744 g/cm2 -7.8% Yes DualFemur Neck Left 05/24/2004 50.8 Osteopenia -1.7 0.807 g/cm2 - -   DualFemur Total Mean 01/20/2024 70.4 Osteopenia -1.8 0.782 g/cm2 -5.1% Yes DualFemur Total Mean 11/25/2019 66.3 Osteopenia -1.5 0.824 g/cm2 -9.4% Yes DualFemur Total Mean 07/26/2016 62.9 Normal -0.8 0.909 g/cm2 -6.5% Yes DualFemur Total Mean 05/24/2004 50.8 Normal -0.3 0.972 g/cm2 - -  ASSESSMENT: The BMD measured at Femur Neck Left is 0.602 g/cm2 with a T-score of -3.1. This patient is considered osteoporotic according to World Health Organization [facility]) criteria. The scan quality is good. Compared with the prior study on 11/25/19, the BMD of the total mean shows a statistically significant decrease. Lumbar spine was excluded due to advanced degenerative changes. Bilateral forearms were excluded due to wrist fractures.    Assessment: 1. Osteoporosis 2.  Hypercalcemia  Plan: 1. Osteoporosis -  likely postmenopausal versus hypercalcemia - I reviewed her existing for bone density studies with her showing worsening osteoporosis despite treatment with Fosamax  for at least 4 years.  Discussed about increased risk of fracture, depending on the T score, greatly increased when the T score is lower than -2.5. Her most recent bone density from March 2025 showed T-score of -3.1 left femoral neck.   - We discussed about the different medication classes, benefits and side effects.  - I explained that, since her response to bisphosphonates is inadequate, she will be considered for next option of treatment preferably Prolia . She agrees with this plan and prescription initiation process will be started.  She will be taken off of Fosamax  after she receives her first Prolia  injection.   I explained the mechanism of action and expected benefits.  - we reviewed her dietary and supplemental calcium  and vitamin D  intake, which I believe are adequate.  In light of her undiagnosed mild to moderate hypercalcemia, she will be offered workup to rule out primary hyperparathyroidism. -She is encouraged to maintain adequate protein and continue to stay active. - Her next bone density is not due until March 2027, however she will return in 6 months with the following labs including CMP, PTH, calcium , magnesium, phosphorus, vitamin D . She does not control bladder completely, would not be considered for 24-hour urine calcium  measurement for now.  - If her insurance does not provide coverage for Prolia , she will be considered for Reclast infusion.   - In light of her reported history of disturbance, she is encouraged to seek sleep study evaluation by sleep specialist.  - I advised patient to maintain close follow up with Cook, Jayce G, DO for primary care needs.  - Time spent with the patient: 45 minutes, of which >50% was spent in obtaining information about her symptoms, reviewing her previous labs, evaluations, and  treatments, counseling her about her osteoporosis, hypercalcemia, and developing a plan to confirm the diagnosis and long term treatment as necessary.  Andree LELON Poli participated in the discussions, expressed understanding, and voiced agreement with the above plans.  All questions were answered to her satisfaction. she is encouraged to contact clinic should she have any questions or concerns prior to her return visit.  Follow up plan: Return in about 6 months (around 11/04/2024) for Prolia  Today (ASAP) and Prolia  NV, F/U with Pre-visit Labs.   Ranny Earl, MD [facility] [address] [place], [place] [address] Phone: [phone]  Fax: [phone]     05/04/2024, 2:45 PM  This note was partially dictated with voice recognition software. Similar sounding words can be transcribed inadequately or may not  be corrected upon review.

## 2024-05-05 ENCOUNTER — Other Ambulatory Visit: Payer: Self-pay

## 2024-05-05 ENCOUNTER — Encounter (HOSPITAL_COMMUNITY): Payer: Self-pay

## 2024-05-05 ENCOUNTER — Other Ambulatory Visit (HOSPITAL_COMMUNITY): Payer: Self-pay

## 2024-05-05 ENCOUNTER — Other Ambulatory Visit: Payer: Self-pay | Admitting: Pharmacy Technician

## 2024-05-05 DIAGNOSIS — M810 Age-related osteoporosis without current pathological fracture: Secondary | ICD-10-CM

## 2024-05-05 MED ORDER — PROLIA 60 MG/ML ~~LOC~~ SOSY
60.0000 mg | PREFILLED_SYRINGE | SUBCUTANEOUS | 1 refills | Status: AC
Start: 2024-05-05 — End: ?
  Filled 2024-05-05 – 2024-05-07 (×2): qty 1, 180d supply, fill #0

## 2024-05-05 NOTE — Progress Notes (Signed)
 Pharmacy Patient Advocate Encounter  Insurance verification completed.   The patient is insured through [facility] ADVANTAGE/RX ADVANCE   Ran test claim for Prolia. Co-pay is $250.   This test claim was processed through [facility]- copay amounts may vary at other pharmacies due to pharmacy/plan contracts, or as the patient moves through the different stages of their insurance plan.

## 2024-05-06 ENCOUNTER — Other Ambulatory Visit (HOSPITAL_COMMUNITY): Payer: Self-pay

## 2024-05-07 ENCOUNTER — Encounter (HOSPITAL_COMMUNITY): Payer: Self-pay

## 2024-05-07 ENCOUNTER — Other Ambulatory Visit (HOSPITAL_COMMUNITY): Payer: Self-pay

## 2024-05-07 ENCOUNTER — Other Ambulatory Visit: Payer: Self-pay

## 2024-05-07 NOTE — Progress Notes (Signed)
 Specialty Pharmacy Initial Fill Coordination Note  Jennifer Coleman is a 73 y.o. female contacted today regarding initial fill of specialty medication(s) Denosumab  (Prolia )   Patient requested Courier to Provider Office   Delivery date: 05/11/24   Verified address: [facility]   /Medication will be filled on 7/11.   Patient is aware of $250 copayment.

## 2024-05-08 ENCOUNTER — Other Ambulatory Visit: Payer: Self-pay

## 2024-05-11 ENCOUNTER — Ambulatory Visit (INDEPENDENT_AMBULATORY_CARE_PROVIDER_SITE_OTHER)

## 2024-05-11 ENCOUNTER — Ambulatory Visit

## 2024-05-11 ENCOUNTER — Ambulatory Visit: Admitting: Podiatry

## 2024-05-11 ENCOUNTER — Encounter: Payer: Self-pay | Admitting: Podiatry

## 2024-05-11 DIAGNOSIS — M779 Enthesopathy, unspecified: Secondary | ICD-10-CM

## 2024-05-11 DIAGNOSIS — M25372 Other instability, left ankle: Secondary | ICD-10-CM

## 2024-05-11 NOTE — Progress Notes (Signed)
 Pt seen today for Nurses Visit for a Prolia  injection. Discussed with pt possible side effects, written information regarding Prolia  given to pt and answered pt's questions. Pt provided Prolia  (Lot # [id number], Exp.Date 10/28/2026). Prolia  60mg  given SQ in upper L abdomen without difficulty. Pt waited the required 15 minutes post injection, no adverse reactions noted.

## 2024-05-11 NOTE — Progress Notes (Signed)
 Subjective:  Patient ID: Jennifer Coleman, female    DOB: 1950-10-30,   MRN: [id number]  Chief Complaint  Patient presents with   Foot Pain    My left ankle gives out on me.    73 y.o. female presents for concern for left ankle. Relates it gives out from time to time and swells as well. This has been ongoing for since April time frame. Relates she has to be very careful about walking and be very concious. Feels her foot is turning in. She has tried a compressive ankle brace without much difference.  Denies any other pedal complaints. Denies n/v/f/c.   Past Medical History:  Diagnosis Date   Anxiety    Arthritis    Basal cell carcinoma 02/02/2016   Left auricle - MOHs   Dysrhythmia    PVCs   Family history of anesthesia complication 25 yrs ago   Sister allergic to sccinocholine - unable to come off ventilator   Hyperlipidemia    Hypertension    Lumbar herniated disc    Osteoporosis    Pneumonia    PONV (postoperative nausea and vomiting)    Premature ventricular contractions    SCC (squamous cell carcinoma) 05/09/2021   in situ- left anterior neck   SCC (squamous cell carcinoma) 05/09/2021   in situ- left upper arm-posterior (EXC)   SCC (squamous cell carcinoma) 05/09/2021   in situ- right inner lower leg   Scoliosis    Shingles    Squamous cell carcinoma of skin 11/14/2011   right cheek - CX3 + 5FU   Squamous cell carcinoma of skin 11/14/2011   right canthus, superior - CX3 + 5FU   Squamous cell carcinoma of skin 11/14/2011   left cheek - CX3 + 5FU   Squamous cell carcinoma of skin 07/26/2015   right forearm - CX3 + 5FU   Squamous cell carcinoma of skin 02/02/2016   left tip shoulder - CX3 + cautery + 5FU    Squamous cell carcinoma of skin 07/17/2016   left post shoulder - tx after biopsy   Squamous cell carcinoma of skin 07/17/2016   right low shin - tx after biopsy   Squamous cell carcinoma of skin 03/13/2019   left post shoulder - tx after biopsy   Squamous cell  carcinoma of skin 03/13/2019   right inner ankle - tx after biopsy   Squamous cell carcinoma of skin 09/23/2019   left cheek - tx after biopsy + MOHs (Dr. Lloyd)   Squamous cell carcinoma of skin 09/23/2019   right upper shin, medial - CX3 + 5FU   Squamous cell carcinoma of skin 09/23/2019   right upper shin, lateral - CX3 + 5FU   Varicose veins    Varicose veins of left lower extremity     Objective:  Physical Exam: Vascular: DP/PT pulses 2/4 bilateral. CFT <3 seconds. Normal hair growth on digits. No edema.  Skin. No lacerations or abrasions bilateral feet.  Musculoskeletal: MMT 5/5 bilateral lower extremities in DF, PF, Inversion and Eversion. Deceased ROM in DF of ankle joint. Tender mildly over the ATFL and some over CFL. No pain along peroneals or anterior ankle. Some pain with DF of the ankle. No pain with MMT. Negative anterior drawer.  Neurological: Sensation intact to light touch.   Assessment:   1. Tendonitis      Plan:  Patient was evaluated and treated and all questions answered. X-rays reviewed and discussed with patient. No acute fractures or dislocations. No degenerative  changes noted.  Discussed peroneal tendinitis and treatment options at length with patient Discussed stretching exercises has tried at home stuff.  Amb ref to PT placed.  Dispensed Tri-Lock ankle brace. Discussed that if the symptoms do not improve can consider PT/MRI. Patient to return in 9  weeks or sooner if symptoms fail to improve or worsen.    [place]berry Failing, DPM

## 2024-05-12 ENCOUNTER — Ambulatory Visit

## 2024-05-25 ENCOUNTER — Ambulatory Visit
Admission: EM | Admit: 2024-05-25 | Discharge: 2024-05-25 | Disposition: A | Discharge status: Home / Self Care | Attending: Nurse Practitioner | Admitting: Nurse Practitioner

## 2024-05-25 DIAGNOSIS — L089 Local infection of the skin and subcutaneous tissue, unspecified: Secondary | ICD-10-CM | POA: Diagnosis not present

## 2024-05-25 DIAGNOSIS — S90425A Blister (nonthermal), left lesser toe(s), initial encounter: Secondary | ICD-10-CM | POA: Diagnosis not present

## 2024-05-25 MED ORDER — DOXYCYCLINE HYCLATE 100 MG PO TABS: 100.0000 mg | ORAL_TABLET | Freq: Two times a day (BID) | ORAL | 0 refills | Status: AC

## 2024-05-25 MED ORDER — MUPIROCIN 2 % EX OINT
1.0000 | TOPICAL_OINTMENT | Freq: Two times a day (BID) | CUTANEOUS | 0 refills | Status: DC
Start: 2024-05-25 — End: 2024-07-14

## 2024-05-25 NOTE — Discharge Instructions (Addendum)
 Take medication as prescribed. Continue applying mupirocin  ointment to the toes to help with infection. You may take over-the-counter Tylenol  as needed for pain or discomfort. Elevate the left lower extremity above the level of the heart is much as possible to help with pain or swelling. Continue to monitor for worsening.  Seek care immediately if you experience increased redness that extends to the other toes or goes into your foot towards your ankle, increased swelling, or if you develop fever, chills, or other concerns. I would like for you to follow-up with your primary care physician within the next 7 to 10 days for reevaluation. Follow-up as needed.

## 2024-05-25 NOTE — ED Triage Notes (Signed)
 Pt reports she has a blister on her left big toe has worsened and has fluid x 6 days

## 2024-05-25 NOTE — ED Provider Notes (Signed)
 [facility]    CSN: [id number] Arrival date & time: 05/25/24  1204      History   Chief Complaint Chief Complaint  Patient presents with   Nail Problem    HPI Jennifer Coleman is a 73 y.o. female.   The history is provided by the patient.   Patient presents for complaints of redness, swelling, and blistering to the left great toe.  Patient states symptoms started after she was wearing a brace for her ankle.  She states that the blister started on 1 side of the toe, and then subsequently worsen.  Patient states that the blister became so large, that she did pop it with a pen.  She states that the area did drain.  She states that she continues to have redness and swelling to the left great toe.  She states the left great toe is tender to touch.  She denies fever, chills, chest pain, abdominal pain, nausea, vomiting, or diarrhea.  States she has been using mupirocin  on the toe.  Past Medical History:  Diagnosis Date   Anxiety    Arthritis    Basal cell carcinoma 02/02/2016   Left auricle - MOHs   Dysrhythmia    PVCs   Family history of anesthesia complication 25 yrs ago   Sister allergic to sccinocholine - unable to come off ventilator   Hyperlipidemia    Hypertension    Lumbar herniated disc    Osteoporosis    Pneumonia    PONV (postoperative nausea and vomiting)    Premature ventricular contractions    SCC (squamous cell carcinoma) 05/09/2021   in situ- left anterior neck   SCC (squamous cell carcinoma) 05/09/2021   in situ- left upper arm-posterior (EXC)   SCC (squamous cell carcinoma) 05/09/2021   in situ- right inner lower leg   Scoliosis    Shingles    Squamous cell carcinoma of skin 11/14/2011   right cheek - CX3 + 5FU   Squamous cell carcinoma of skin 11/14/2011   right canthus, superior - CX3 + 5FU   Squamous cell carcinoma of skin 11/14/2011   left cheek - CX3 + 5FU   Squamous cell carcinoma of skin 07/26/2015   right forearm - CX3 + 5FU    Squamous cell carcinoma of skin 02/02/2016   left tip shoulder - CX3 + cautery + 5FU    Squamous cell carcinoma of skin 07/17/2016   left post shoulder - tx after biopsy   Squamous cell carcinoma of skin 07/17/2016   right low shin - tx after biopsy   Squamous cell carcinoma of skin 03/13/2019   left post shoulder - tx after biopsy   Squamous cell carcinoma of skin 03/13/2019   right inner ankle - tx after biopsy   Squamous cell carcinoma of skin 09/23/2019   left cheek - tx after biopsy + MOHs (Dr. 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   Depression with anxiety 11/05/2017   Essential hypertension 09/12/2014    Past Surgical History:  Procedure Laterality Date   CARPAL TUNNEL RELEASE     CERVICAL DISC SURGERY  1992   Fusion   CHOLECYSTECTOMY N/A 11/21/2018   Procedure: LAPAROSCOPIC CHOLECYSTECTOMY;  Surgeon: Kallie Manuelita BROCKS, MD;  Location: [facility];  Service: General;  Laterality: N/A;   COLONOSCOPY N/A 06/07/2016   Procedure: COLONOSCOPY;  Surgeon: Claudis RAYMOND Rivet, MD;  Location: [facility];  Service: Endoscopy;  Laterality: N/A;  1:00   KNEE ARTHROSCOPY  09/16/2012   Procedure: ARTHROSCOPY KNEE;  Surgeon: Maude LELON Right, MD;  Location: [facility] OR;  Service: Orthopedics;  Laterality: Right;  Right Knee Arthroscopy   LAPAROSCOPIC GASTRIC SLEEVE RESECTION N/A 08/17/2019   Procedure: LAPAROSCOPIC GASTRIC SLEEVE RESECTION AND  HIATAL HERNIA REPAIR, UPPPER ENDO AND ERAS PATHWAY;  Surgeon: Gladis Cough, MD;  Location: [facility];  Service: General;  Laterality: N/A;   SALPINGOOPHORECTOMY Left 1985   SHOULDER ARTHROSCOPY WITH OPEN ROTATOR CUFF REPAIR AND DISTAL CLAVICLE ACROMINECTOMY Right 05/03/2016   Procedure: RIGHT SHOULDER ARTHROSCOPY WITH MINI-OPEN ROTATOR CUFF REPAIR,DISTAL CLAVICLE RESECTION, SUBACROMIAL DECOMPRESSION.;  Surgeon: Maude LELON Right, MD;  Location: [facility];  Service: Orthopedics;  Laterality: Right;   SHOULDER ARTHROSCOPY WITH ROTATOR CUFF REPAIR Right 09/25/2016   Procedure: Right Shoulder Manipulation, Arthroscopic Debridement of Joint, Removal of Loose Body and Re-Repair of Rotator Cuff;  Surgeon: Maude LELON Right, MD;  Location: [facility] OR;  Service: Orthopedics;  Laterality: Right;    OB History   No obstetric history on file.      Home Medications    Prior to Admission medications   Medication Sig Start Date End Date Taking? Authorizing Provider  aspirin  EC 81 MG tablet Take 81 mg by mouth daily. Swallow whole.    [provider]  Biotin 2500 MCG CHEW Chew 1 tablet by mouth daily.    [provider]  cetirizine  (ZYRTEC ) 10 MG tablet Take 1 tablet (10 mg total) by mouth daily. 01/15/23   Cook, Jayce G, DO  denosumab  (PROLIA ) 60 MG/ML SOSY injection Inject 60 mg into the skin every 6 (six) months. 05/05/24   Nida, Gebreselassie W, MD  diclofenac  (VOLTAREN ) 75 MG EC tablet Take 75 mg by mouth as needed.    [provider]  doxycycline  (VIBRA -TABS) 100 MG tablet Take 1 tablet (100 mg total) by mouth 2 (two) times daily. Patient not taking: Reported on 05/11/2024 01/29/24   Cook, Jayce G, DO  enalapril  (VASOTEC ) 20 MG tablet Take 1 tablet (20 mg total) by mouth 2 (two) times daily. 01/16/24   Cook, Jayce G, DO  escitalopram  (LEXAPRO ) 20 MG tablet Take 1 tablet (20 mg total) by mouth daily. 01/16/24   Cook, Jayce G, DO  fluticasone  (FLONASE ) 50 MCG/ACT nasal  spray Place 2 sprays into both nostrils daily. 01/30/23   Cook, Jayce G, DO  MAGNESIUM CITRATE PO Take 1 tablet by mouth daily.    [provider]  Multiple Vitamins-Minerals (ONE-A-DAY WOMENS 50 PLUS PO) Take 1 tablet by mouth daily.    [provider]  PROAIR  HFA 108 (90 Base) MCG/ACT inhaler INHALE TWO PUFFS BY MOUTH EVERY 4 TO 6 HOURS AS NEEDED FOR FOR WHEEZING 11/11/20   Booker Darice SAUNDERS, FNP  promethazine -dextromethorphan (PROMETHAZINE -DM) 6.25-15 MG/5ML syrup Take 5 mLs by mouth 4 (four) times daily as needed for cough. Patient not taking: Reported on 05/11/2024 01/29/24  Cook, Jayce G, DO  rosuvastatin  (CRESTOR ) 10 MG tablet TAKE 1 TABLET BY MOUTH ONCE DAILY. 04/22/24   Cook, Jayce G, DO    Family History Family History  Problem Relation Age of Onset   Multiple myeloma Mother    Osteoporosis Mother    Diabetes Father    Hypertension Father    Heart attack Father    Heart disease Sister    Diabetes Mellitus II Sister    Hypertension Sister    Heart disease Brother        51's    Social History Social History   Tobacco Use   Smoking status: Former    Current packs/day: 0.00    Types: Cigarettes    Quit date: 09/22/1992    Years since quitting: 31.6   Smokeless tobacco: Never  Vaping Use   Vaping status: Never Used  Substance Use Topics   Alcohol use: No    Alcohol/week: 0.0 standard drinks of alcohol   Drug use: No     Allergies   Penicillins, Shellfish allergy, Gabapentin , Levaquin  [levofloxacin ], Lyrica [pregabalin], Codeine, and Morphine  and codeine   Review of Systems Review of Systems Per HPI  Physical Exam Triage Vital Signs ED Triage Vitals  Encounter Vitals Group     BP 05/25/24 1218 (!) 141/84     Girls Systolic BP Percentile --      Girls Diastolic BP Percentile --      Boys Systolic BP Percentile --      Boys Diastolic BP Percentile --      Pulse Rate 05/25/24 1218 (!) 55     Resp 05/25/24 1218 20     Temp 05/25/24 1218 98.4 F  (36.9 C)     Temp Source 05/25/24 1218 Oral     SpO2 05/25/24 1218 98 %     Weight --      Height --      Head Circumference --      Peak Flow --      Pain Score 05/25/24 1217 6     Pain Loc --      Pain Education --      Exclude from Growth Chart --    No data found.  Updated Vital Signs BP (!) 141/84 (BP Location: Right Arm)   Pulse (!) 55   Temp 98.4 F (36.9 C) (Oral)   Resp 20   SpO2 98%   Visual Acuity Right Eye Distance:   Left Eye Distance:   Bilateral Distance:    Right Eye Near:   Left Eye Near:    Bilateral Near:     Physical Exam Vitals and nursing note reviewed.  Constitutional:      General: She is not in acute distress.    Appearance: Normal appearance.  HENT:     Head: Normocephalic.  Eyes:     Extraocular Movements: Extraocular movements intact.     Pupils: Pupils are equal, round, and reactive to light.  Cardiovascular:     Rate and Rhythm: Normal rate and regular rhythm.     Pulses: Normal pulses.     Heart sounds: Normal heart sounds.  Pulmonary:     Effort: Pulmonary effort is normal. No respiratory distress.     Breath sounds: Normal breath sounds. No stridor. No wheezing, rhonchi or rales.  Musculoskeletal:     Cervical back: Normal range of motion.  Feet:     Left foot:     Skin integrity: Blister (Left great toe and  second lesser toe), erythema (Left great toe) and warmth (Left great toe) present.     Comments: See attached images. Skin:    General: Skin is warm and dry.  Neurological:     General: No focal deficit present.     Mental Status: She is alert and oriented to person, place, and time.  Psychiatric:        Mood and Affect: Mood normal.        Behavior: Behavior normal.           UC Treatments / Results  Labs (all labs ordered are listed, but only abnormal results are displayed) Labs Reviewed - No data to display  EKG   Radiology No results found.  Procedures Procedures (including critical care  time)  Medications Ordered in UC Medications - No data to display  Initial Impression / Assessment and Plan / UC Course  I have reviewed the triage vital signs and the nursing notes.  Pertinent labs & imaging results that were available during my care of the patient were reviewed by me and considered in my medical decision making (see chart for details).  Will treat for localized cellulitis of the left great toe with doxycycline  100 mg twice daily for the next 10 days.  Site was cleansed and dressing was applied along with antibiotic ointment.  Postop shoe was provided to allow for additional compression and support and to allow openness to the wound.  Supportive care recommendations were provided discussed with the patient to include over-the-counter analgesics, and RICE therapy.  Discussed indications with patient regarding follow-up.  Would like for the patient to follow-up with her PCP within the next 7 to 10 days for reevaluation.  Patient was in agreement with this plan of care and verbalizes understanding.  All questions were answered.  Patient stable for discharge.  Final Clinical Impressions(s) / UC Diagnoses   Final diagnoses:  None   Discharge Instructions   None    ED Prescriptions   None    PDMP not reviewed this encounter.   Gilmer Etta PARAS, NP 05/25/24 1257

## 2024-05-26 ENCOUNTER — Ambulatory Visit: Admitting: Family Medicine

## 2024-05-26 ENCOUNTER — Ambulatory Visit: Payer: Self-pay

## 2024-05-27 ENCOUNTER — Encounter (HOSPITAL_COMMUNITY): Payer: Self-pay

## 2024-05-27 ENCOUNTER — Emergency Department (HOSPITAL_COMMUNITY)

## 2024-05-27 ENCOUNTER — Other Ambulatory Visit: Payer: Self-pay

## 2024-05-27 ENCOUNTER — Emergency Department (HOSPITAL_COMMUNITY)
Admission: EM | Admit: 2024-05-27 | Discharge: 2024-05-27 | Disposition: A | Discharge status: Home / Self Care | Attending: Emergency Medicine | Admitting: Emergency Medicine

## 2024-05-27 DIAGNOSIS — X501XXA Overexertion from prolonged static or awkward postures, initial encounter: Secondary | ICD-10-CM | POA: Insufficient documentation

## 2024-05-27 DIAGNOSIS — R110 Nausea: Secondary | ICD-10-CM | POA: Diagnosis not present

## 2024-05-27 DIAGNOSIS — S4981XA Other specified injuries of right shoulder and upper arm, initial encounter: Secondary | ICD-10-CM | POA: Diagnosis not present

## 2024-05-27 DIAGNOSIS — S4991XA Unspecified injury of right shoulder and upper arm, initial encounter: Secondary | ICD-10-CM | POA: Diagnosis not present

## 2024-05-27 DIAGNOSIS — M25519 Pain in unspecified shoulder: Secondary | ICD-10-CM | POA: Diagnosis not present

## 2024-05-27 DIAGNOSIS — M25511 Pain in right shoulder: Secondary | ICD-10-CM | POA: Diagnosis not present

## 2024-05-27 DIAGNOSIS — M19011 Primary osteoarthritis, right shoulder: Secondary | ICD-10-CM | POA: Diagnosis not present

## 2024-05-27 NOTE — ED Triage Notes (Signed)
 Pt BIB ems for rt shoulder dislocation. Pt was doing morning stretches pulled her rt arm over head pulled and heard a pop. Pt has hx of rotator cuff issues on that side as well. EMS administered 100 mcg en route.

## 2024-05-27 NOTE — Discharge Instructions (Signed)
 As discussed, your evaluation today has been largely reassuring.  But, it is important that you monitor your condition carefully, and do not hesitate to return to the ED if you develop new, or concerning changes in your condition. ? ?Otherwise, please follow-up with your physician for appropriate ongoing care. ? ?

## 2024-05-27 NOTE — ED Provider Notes (Signed)
 [facility] AT [facility] Provider Note   CSN: [id number] Arrival date & time: 05/27/24  1043     Patient presents with: Shoulder Injury   Jennifer Coleman is a 73 y.o. female.   HPI Presents after sustaining a shoulder injury.  She notes that she was performing stretches when she felt a pop, was unable to move her arm.  Arm was in an abducted, extended position.  She received fentanyl , subtraction, and before arrival to the ED feels as though it improved substantially.  Currently no complaints, no distal dysesthesia.    Prior to Admission medications   Medication Sig Start Date End Date Taking? Authorizing Provider  Biotin 2500 MCG CHEW Chew 1 tablet by mouth daily.   Yes [provider]  cetirizine  (ZYRTEC ) 10 MG tablet Take 1 tablet (10 mg total) by mouth daily. 01/15/23  Yes Cook, Jayce G, DO  denosumab  (PROLIA ) 60 MG/ML SOSY injection Inject 60 mg into the skin every 6 (six) months. 05/05/24  Yes Nida, Gebreselassie W, MD  diclofenac  (VOLTAREN ) 75 MG EC tablet Take 75 mg by mouth as needed.   Yes [provider]  doxycycline  (VIBRA -TABS) 100 MG tablet Take 1 tablet (100 mg total) by mouth 2 (two) times daily for 10 days. 05/25/24 06/04/24 Yes Leath-Warren, Etta PARAS, NP  enalapril  (VASOTEC ) 20 MG tablet Take 1 tablet (20 mg total) by mouth 2 (two) times daily. 01/16/24  Yes Cook, Jayce G, DO  escitalopram  (LEXAPRO ) 20 MG tablet Take 1 tablet (20 mg total) by mouth daily. 01/16/24  Yes Cook, Jayce G, DO  fluticasone  (FLONASE ) 50 MCG/ACT nasal spray Place 2 sprays into both nostrils daily. 01/30/23  Yes Cook, Jayce G, DO  MAGNESIUM CITRATE PO Take 1 tablet by mouth daily.   Yes [provider]  Multiple Vitamins-Minerals (ONE-A-DAY WOMENS 50 PLUS PO) Take 1 tablet by mouth daily.   Yes [provider]  mupirocin  ointment (BACTROBAN ) 2 % Apply 1 Application topically 2 (two) times daily. 05/25/24  Yes Leath-Warren, Etta PARAS, NP   PROAIR  HFA 108 (90 Base) MCG/ACT inhaler INHALE TWO PUFFS BY MOUTH EVERY 4 TO 6 HOURS AS NEEDED FOR FOR WHEEZING 11/11/20  Yes Booker Darice SAUNDERS, FNP  rosuvastatin  (CRESTOR ) 10 MG tablet TAKE 1 TABLET BY MOUTH ONCE DAILY. 04/22/24  Yes Cook, Jayce G, DO    Allergies: Penicillins, Shellfish allergy, Gabapentin , Levaquin  [levofloxacin ], Lyrica [pregabalin], Codeine, and Morphine  and codeine    Review of Systems  Updated Vital Signs BP 106/68   Pulse 61   Temp 98 F (36.7 C) (Oral)   Resp 19   Ht 5' 5 (1.651 m)   Wt 96.2 kg   SpO2 94%   BMI 35.28 kg/m   Physical Exam Vitals and nursing note reviewed.  Constitutional:      General: She is not in acute distress.    Appearance: She is well-developed.  HENT:     Head: Normocephalic and atraumatic.  Eyes:     Conjunctiva/sclera: Conjunctivae normal.  Cardiovascular:     Rate and Rhythm: Normal rate and regular rhythm.     Pulses: Normal pulses.  Pulmonary:     Effort: Pulmonary effort is normal. No respiratory distress.     Breath sounds: No stridor.  Abdominal:     General: There is no distension.  Musculoskeletal:       Arms:  Skin:    General: Skin is warm and dry.  Neurological:  Mental Status: She is alert and oriented to person, place, and time.     Cranial Nerves: No cranial nerve deficit.  Psychiatric:        Mood and Affect: Mood normal.     (all labs ordered are listed, but only abnormal results are displayed) Labs Reviewed - No data to display  EKG: None  Radiology: DG Shoulder Right Result Date: 05/27/2024 CLINICAL DATA:  73 year old female with right shoulder pain, possible dislocation while reaching. EXAM: RIGHT SHOULDER - 2+ VIEW COMPARISON:  11/22/2021. FINDINGS: Two views 1105 hours. No glenohumeral joint dislocation. Stable visualized osseous structures. Chronic right AC joint degeneration. Negative visible right chest. IMPRESSION: No acute fracture or dislocation identified about the right  shoulder. Electronically Signed   By: VEAR Hurst M.D.   On: 05/27/2024 11:23     Procedures   Medications Ordered in the ED - No data to display                                  Medical Decision Making Patient presents after shoulder injury occurred prior to ED arrival, with improvement prior to my assessment.  She is distally neurovascular intact, I reviewed the x-ray at bedside with portable technician, no dislocation, no fracture.  Patient monitored as she received fentanyl  prior to arrival, had no decompensation, and after conversation with her, husband, family, on follow-up, home care, patient discharged in stable condition.  Amount and/or Complexity of Data Reviewed Independent Historian: EMS External Data Reviewed: notes. Radiology: ordered and independent interpretation performed. Decision-making details documented in ED Course.   Final diagnoses:  Injury of right shoulder, initial encounter    ED Discharge Orders     None          Garrick Charleston, MD 05/27/24 1214

## 2024-05-28 ENCOUNTER — Ambulatory Visit: Admitting: Physical Therapy

## 2024-06-16 ENCOUNTER — Other Ambulatory Visit: Payer: Self-pay

## 2024-06-16 ENCOUNTER — Emergency Department (HOSPITAL_COMMUNITY)
Admission: EM | Admit: 2024-06-16 | Discharge: 2024-06-16 | Disposition: A | Discharge status: Home / Self Care | Attending: Emergency Medicine | Admitting: Emergency Medicine

## 2024-06-16 ENCOUNTER — Emergency Department (HOSPITAL_COMMUNITY)

## 2024-06-16 DIAGNOSIS — S8992XA Unspecified injury of left lower leg, initial encounter: Secondary | ICD-10-CM | POA: Diagnosis present

## 2024-06-16 DIAGNOSIS — Z23 Encounter for immunization: Secondary | ICD-10-CM | POA: Diagnosis not present

## 2024-06-16 DIAGNOSIS — W208XXA Other cause of strike by thrown, projected or falling object, initial encounter: Secondary | ICD-10-CM | POA: Insufficient documentation

## 2024-06-16 DIAGNOSIS — I10 Essential (primary) hypertension: Secondary | ICD-10-CM | POA: Diagnosis not present

## 2024-06-16 DIAGNOSIS — Z79899 Other long term (current) drug therapy: Secondary | ICD-10-CM | POA: Insufficient documentation

## 2024-06-16 DIAGNOSIS — Z85828 Personal history of other malignant neoplasm of skin: Secondary | ICD-10-CM | POA: Diagnosis not present

## 2024-06-16 DIAGNOSIS — S81819A Laceration without foreign body, unspecified lower leg, initial encounter: Secondary | ICD-10-CM | POA: Diagnosis not present

## 2024-06-16 DIAGNOSIS — S81811A Laceration without foreign body, right lower leg, initial encounter: Secondary | ICD-10-CM | POA: Insufficient documentation

## 2024-06-16 DIAGNOSIS — M1711 Unilateral primary osteoarthritis, right knee: Secondary | ICD-10-CM | POA: Diagnosis not present

## 2024-06-16 MED ORDER — TETANUS-DIPHTH-ACELL PERTUSSIS 5-2.5-18.5 LF-MCG/0.5 IM SUSY
0.5000 mL | PREFILLED_SYRINGE | Freq: Once | INTRAMUSCULAR | Status: AC
  Administered 2024-06-16: 0.5 mL via INTRAMUSCULAR
  Filled 2024-06-16: qty 0.5

## 2024-06-16 MED ORDER — LIDOCAINE-EPINEPHRINE (PF) 2 %-1:200000 IJ SOLN
10.0000 mL | Freq: Once | INTRAMUSCULAR | Status: AC
  Administered 2024-06-16: 10 mL
  Filled 2024-06-16: qty 20

## 2024-06-16 NOTE — ED Triage Notes (Signed)
 Laceration to RT lower leg from a landscaping spike.  Area is wrapped at the time but ems states the laceration is approx 3 inches long.  Not on blood thinners.

## 2024-06-16 NOTE — ED Provider Notes (Signed)
 [place] [facility] DE[facility]MENT AT [facility] Provider Note   CSN: [id number] Arrival date & time: 06/16/24  1413     Patient presents with: Laceration   Jennifer Coleman is a 73 y.o. female who presents to the emergency department with a chief complaint of laceration to her right lower extremity.  Patient states that her and her family were outside trying to redo their garden whenever the stack of landscaping boards fell over and one of the landscaping spikes went directly into her leg.  Patient states that she has not walked since and that the bleeding was controlled with pressure after bleeding through approximately 2 towels.  Patient is unsure when her last tetanus shot was.  Denies fever, chills, chest pain, shortness of breath, abdominal pain, nausea, vomiting.  Denies taking a blood thinning medication, denies head or neck injury. Past medical history significant for hypertension, depression, osteoporosis, hyperlipidemia, history of stroke, squamous cell carcinoma, etc.    Laceration      Prior to Admission medications   Medication Sig Start Date End Date Taking? Authorizing Provider  Biotin 2500 MCG CHEW Chew 1 tablet by mouth daily.    [provider]  cetirizine  (ZYRTEC ) 10 MG tablet Take 1 tablet (10 mg total) by mouth daily. 01/15/23   Cook, Jayce G, DO  denosumab  (PROLIA ) 60 MG/ML SOSY injection Inject 60 mg into the skin every 6 (six) months. 05/05/24   Nida, Gebreselassie W, MD  diclofenac  (VOLTAREN ) 75 MG EC tablet Take 75 mg by mouth as needed.    [provider]  enalapril  (VASOTEC ) 20 MG tablet Take 1 tablet (20 mg total) by mouth 2 (two) times daily. 01/16/24   Cook, Jayce G, DO  escitalopram  (LEXAPRO ) 20 MG tablet Take 1 tablet (20 mg total) by mouth daily. 01/16/24   Cook, Jayce G, DO  fluticasone  (FLONASE ) 50 MCG/ACT nasal spray Place 2 sprays into both nostrils daily. 01/30/23   Cook, Jayce G, DO  MAGNESIUM CITRATE PO Take 1 tablet by mouth  daily.    [provider]  Multiple Vitamins-Minerals (ONE-A-DAY WOMENS 50 PLUS PO) Take 1 tablet by mouth daily.    [provider]  mupirocin  ointment (BACTROBAN ) 2 % Apply 1 Application topically 2 (two) times daily. 05/25/24   Leath-Warren, Etta PARAS, NP  PROAIR  HFA 108 (90 Base) MCG/ACT inhaler INHALE TWO PUFFS BY MOUTH EVERY 4 TO 6 HOURS AS NEEDED FOR FOR WHEEZING 11/11/20   Booker Darice SAUNDERS, FNP  rosuvastatin  (CRESTOR ) 10 MG tablet TAKE 1 TABLET BY MOUTH ONCE DAILY. 04/22/24   Cook, Jayce G, DO    Allergies: Penicillins, Shellfish allergy, Gabapentin , Levaquin  [levofloxacin ], Lyrica [pregabalin], Codeine, and Morphine  and codeine    Review of Systems  Skin:  Positive for wound (5 inch laceration to RLE).    Updated Vital Signs BP (!) 151/56 (BP Location: Left Arm)   Pulse 68   Temp 98.3 F (36.8 C) (Oral)   Resp 17   Ht 5' 5 (1.651 m)   Wt 96 kg   SpO2 97%   BMI 35.22 kg/m   Physical Exam Vitals and nursing note reviewed.  Constitutional:      General: She is awake. She is not in acute distress.    Appearance: Normal appearance. She is not ill-appearing, toxic-appearing or diaphoretic.  HENT:     Head: Normocephalic and atraumatic.  Eyes:     General: No scleral icterus. Pulmonary:     Effort: Pulmonary effort is normal.  No respiratory distress.  Musculoskeletal:        General: Normal range of motion.     Right lower leg: No edema.     Left lower leg: No edema.     Comments: ROM of RLE as expected, patient able to flex and extend knee, able to dorsi flex and plantar flex r ankle, patient able to move all toes of RLE, RLE neurovascularly intact   DP pulse 2+, cap refill < 2 seconds   Skin:    General: Skin is warm.     Capillary Refill: Capillary refill takes less than 2 seconds.     Comments: Approximately 3 inch laceration present to right lower extremity down to subcutaneous fat, bleeding controlled at this time, no obvious foreign body  appreciated on initial exam  On further examination during lac repair, puncture like wound present, no appreciated foreign body during probing or washout  Neurological:     General: No focal deficit present.     Mental Status: She is alert and oriented to person, place, and time.  Psychiatric:        Mood and Affect: Mood normal.        Behavior: Behavior normal. Behavior is cooperative.     (all labs ordered are listed, but only abnormal results are displayed) Labs Reviewed - No data to display  EKG: None  Radiology: DG Tibia/Fibula Right Result Date: 06/16/2024 CLINICAL DATA:  Laceration.  Evaluate for foreign body. EXAM: RIGHT TIBIA AND FIBULA - 2 VIEW COMPARISON:  None Available. FINDINGS: There is no acute fracture or dislocation. There are mild degenerative changes of the lateral compartment of the knee. There is soft tissue swelling and laceration of the lateral aspect of the proximal lower extremity. There is no radiopaque foreign body at the level of the laceration. There some amorphous subcutaneous densities measuring up to 7 mm with associated soft tissue swelling in the mid anterolateral left calf which may represent soft tissue calcifications, but foreign bodies are difficult to exclude. IMPRESSION: 1. No acute fracture or dislocation. 2. Soft tissue swelling and laceration of the lateral aspect of the proximal lower extremity. No radiopaque foreign body at the level of the laceration. 3. Amorphous subcutaneous densities and swelling in the mid anterolateral left calf which may represent soft tissue calcifications, but foreign bodies are difficult to exclude. Electronically Signed   By: Greig Pique M.D.   On: 06/16/2024 17:01     .Laceration Repair  Date/Time: 06/17/2024 12:38 AM  Performed by: Janetta Terrall FALCON, PA-C Authorized by: Janetta Terrall FALCON, PA-C   Consent:    Consent obtained:  Verbal   Consent given by:  Patient   Risks discussed:  Infection, pain, poor  cosmetic result, retained foreign body, need for additional repair, poor wound healing, nerve damage and vascular damage   Alternatives discussed:  No treatment and delayed treatment Universal protocol:    Procedure explained and questions answered to patient or proxy's satisfaction: yes     Patient identity confirmed:  Verbally with patient and arm band Anesthesia:    Anesthesia method:  Local infiltration   Local anesthetic:  Lidocaine  2% WITH epi Laceration details:    Location:  Leg   Leg location:  R lower leg   Length (cm):  12.7 Pre-procedure details:    Preparation:  Imaging obtained to evaluate for foreign bodies Exploration:    Limited defect created (wound extended): yes     Imaging obtained: x-ray     Foreign  body noted: foreign body not noted at level of laceration however foreign body could not be ruled out by radiologist further down leg, I do not believe this is clinically relevant.     Wound exploration: wound explored through full range of motion     Wound extent: no foreign body and no underlying fracture   Treatment:    Area cleansed with:  Saline and povidone-iodine   Amount of cleaning:  Standard   Visualized foreign bodies/material removed: no     Layers/structures repaired:  Deep subcutaneous Deep subcutaneous:    Suture size:  4-0   Suture material:  Vicryl   Suture technique:  Simple interrupted   Number of sutures:  1 Skin repair:    Repair method:  Sutures   Suture size:  4-0   Suture material:  Prolene   Suture technique:  Simple interrupted   Number of sutures:  18 Approximation:    Approximation:  Close Repair type:    Repair type:  Intermediate Post-procedure details:    Dressing:  Non-adherent dressing   Procedure completion:  Tolerated well, no immediate complications    Medications Ordered in the ED  Tdap (BOOSTRIX ) injection 0.5 mL (0.5 mLs Intramuscular Given 06/16/24 1620)  lidocaine -EPINEPHrine  (XYLOCAINE  W/EPI) 2 %-1:200000 (PF)  injection 10 mL (10 mLs Infiltration Given by Other 06/16/24 1555)                                    Medical Decision Making Amount and/or Complexity of Data Reviewed Radiology: ordered.  Risk Prescription drug management.   Patient presents to the ED for concern of laceration vs. Puncture wound, this involves an extensive number of treatment options, and is a complaint that carries with it a high risk of complications and morbidity.  The differential diagnosis includes laceration, puncture wound, retained foreign body, muscle injury, tendon/ligament injury, vascular injury, nerve injury, fracture, etc.   Co morbidities that complicate the patient evaluation  hypertension, depression, osteoporosis, hyperlipidemia, history of stroke, squamous cell carcinoma   Imaging Studies ordered:  I ordered imaging studies including portable right tib-fib x-ray I independently visualized and interpreted imaging which showed no evidence of foreign body at the level of the laceration in the right lower extremity, no evidence of fracture, the radiologist could not rule out foreign body vs. Subcutaneous fat calcification further down the RLE however clinically I do not know how this could be a foreign body as it is substantially further down from the laceration area I agree with the radiologist interpretation   Medicines ordered and prescription drug management:  I ordered medication including tetanus shot and lidocaine  for laceration and laceration repair Reevaluation of the patient after these medicines showed that the patient improved I have reviewed the patients home medicines and have made adjustments as needed   Test Considered:  none   Critical Interventions:  none   Problem List / ED Course:  73 year old female, right lower extremity laceration, bleeding controlled, down to layer of subcutaneous fat with puncture wound present extending deeper No blood thinners, no head injury,  no neck injury Last tetanus 2017, ordered Tdap to update Patient offered x-ray to assess for fracture as well as retained foreign body, declined at this time stating that she does not believe there is any foreign bodies present and she knows that her leg is not broken Will plan for bedside laceration repair with sutures and infiltration  with lidocaine  for numbing Upon further examination during laceration repair, puncture like wound present, puncture wound probed and washed out by myself with no evidence of foreign body, discussed with patient that I do feel an x-ray is warranted to rule out the possibility of foreign body, portable right tib-fib x-ray ordered which showed no evidence of foreign body at the level of the laceration however radiology could not rule out possibility of foreign body versus subcutaneous fat calcifications further down the leg, clinically I do not think this finding is related as this is substantially further down the patient's leg from the laceration Laceration repair performed by myself at bedside, wound well-approximated with absorbable as well as nonabsorbable sutures, return precautions given for laceration repair Return precautions given Patient discharged Most likely diagnosis at this time is laceration versus puncture wound to right lower extremity, Tdap updated, vital signs stable, patient in stable condition for discharge with return precautions, patient was offered crutches however decided that they were not necessary.  Low clinical suspicion at this time for acute life-threatening process as patient's vital signs remained stable throughout the emergency department visit, patient tolerated procedure well, ROM as expected of RLE, RLE neurovascularly intact.  Patient instructed to make her primary care provider aware of workup, repair, and findings completed in the emergency department today.   Reevaluation:  After the interventions noted above, I reevaluated the  patient and found that they have :improved   Social Determinants of Health:  none   Dispostion:  After consideration of the diagnostic results and the patients response to treatment, I feel that the patent would benefit from discharge and outpatient therapy as prescribed.     Final diagnoses:  Laceration of right lower extremity, initial encounter    ED Discharge Orders     None          Janetta Terrall FALCON, PA-C 06/17/24 0044    Franklyn Sid SAILOR, MD 06/17/24 [phone]

## 2024-06-16 NOTE — ED Notes (Signed)
 Pt in bed, pt states that her pain is a 3/10, pt states that she doesn't want anything for pain at this time, dressing placed per PA instructions, pt states that she is ready to go home, pt verbalized understanding d/c instructions and follow up, advised to return for any concerns, signs of infection or worsening symptoms. Pt from department.

## 2024-06-16 NOTE — Discharge Instructions (Addendum)
 It was a pleasure taking care of you today.  Based on your history, physical exam, and imaging I feel you are safe for discharge.  The x-ray of your right lower leg showed no retained foreign body.  After this x-ray was completed I repaired your laceration using sutures.  One absorbable suture was placed and 18 non-absorbable sutures were placed. These will need to be removed in approximately 5-7 days. This can be done back here in the emergency department, by urgent care, or possibly by your primary care doctor.  Please continue to keep the area clean and dry, it is okay to wash it with soap and water , but do not soak the area.  Please use rest, ice, compression, and elevation to help with swelling.  You will have significant bruising due to the mechanism of your injury.  Please return to the emergency department if experiencing the following symptoms including but not limited to severe bleeding, signs of infection including fever, chills, unexplained weakness, drainage, redness surrounding the wound, wound dehiscence, severe pain, trouble walking, or other concerning symptom.

## 2024-06-25 ENCOUNTER — Encounter: Payer: Self-pay | Admitting: Family Medicine

## 2024-06-25 ENCOUNTER — Ambulatory Visit (INDEPENDENT_AMBULATORY_CARE_PROVIDER_SITE_OTHER): Admitting: Family Medicine

## 2024-06-25 VITALS — BP 121/74 | HR 72 | Temp 98.1°F | Ht 65.0 in | Wt 210.0 lb

## 2024-06-25 DIAGNOSIS — L089 Local infection of the skin and subcutaneous tissue, unspecified: Secondary | ICD-10-CM | POA: Insufficient documentation

## 2024-06-25 DIAGNOSIS — S81811D Laceration without foreign body, right lower leg, subsequent encounter: Secondary | ICD-10-CM

## 2024-06-25 MED ORDER — DOXYCYCLINE HYCLATE 100 MG PO TABS: 100.0000 mg | ORAL_TABLET | Freq: Two times a day (BID) | ORAL | 0 refills | Status: DC

## 2024-06-25 MED ORDER — METRONIDAZOLE 500 MG PO TABS: 500.0000 mg | ORAL_TABLET | Freq: Three times a day (TID) | ORAL | 0 refills | Status: AC

## 2024-06-25 NOTE — Progress Notes (Signed)
 Subjective:  Patient ID: Jennifer Coleman, female    DOB: 04/20/1951  Age: 73 y.o. MRN: [id number]  CC:   Chief Complaint  Patient presents with   Hospitalization Follow-up    Right out leg stitch removal from laceration - ER follow up     HPI:  73 year old female presents for ER follow-up.  Patient recently suffered a laceration to her right lower extremity.  Laceration was repaired in the ER on 8/19.  No prophylactic antibiotics.  Patient reports that the wound is warm and surrounding area is erythematous.  She reports associated swelling.  Wound is draining.  No fever.  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   Depression with anxiety 11/05/2017   Essential hypertension 09/12/2014    Social Hx   Social History   Socioeconomic History   Marital status: Married    Spouse name: Not on file   Number of children: Not on file   Years of education: Not on file   Highest education level: Not on file  Occupational History   Occupation: Insurance underwriter  Tobacco Use   Smoking status: Former    Current packs/day: 0.00    Types: Cigarettes    Quit date: 09/22/1992    Years since quitting: 31.7   Smokeless tobacco: Never  Vaping Use   Vaping status: Never Used  Substance and Sexual Activity   Alcohol use: No    Alcohol/week: 0.0 standard drinks of alcohol   Drug use: No   Sexual activity: Not Currently  Other Topics Concern   Not on file  Social History Narrative   Lives with husband in [place], [place]   Right handed   Social Drivers of Health   Financial Resource Strain: Low Risk  (04/17/2024)   Overall Financial Resource Strain  (CARDIA)    Difficulty of Paying Living Expenses: Not hard at all  Food Insecurity: No Food Insecurity (04/17/2024)   Hunger Vital Sign    Worried About Running Out of Food in the Last Year: Never true    Ran Out of Food in the Last Year: Never true  Transportation Needs: No Transportation Needs (04/17/2024)   PRAPARE - Administrator, Civil Service (Medical): No    Lack of Transportation (Non-Medical): No  Physical Activity: Insufficiently Active (04/17/2024)   Exercise Vital Sign    Days of Exercise per Week: 3 days    Minutes of Exercise per Session: 30 min  Stress: No Stress Concern Present (04/17/2024)   [employer] of Occupational Health - Occupational Stress Questionnaire    Feeling of Stress: Not at all  Social Connections: Socially Integrated (04/17/2024)   Social Connection and Isolation Panel    Frequency of Communication with Friends and Family: More than three times a week    Frequency of Social Gatherings with Friends and Family: More than three times a week    Attends Religious Services: More than 4 times per year    Active Member of [employer] or Organizations: Yes    Attends Engineer, structural: More than 4 times per year    Marital Status: Married    Review of Systems Per HPI  Objective:  BP 121/74   Pulse 72   Temp 98.1 F (36.7 C)   Ht 5' 5 (1.651 m)   Wt 210 lb (95.3 kg)   SpO2 97%   BMI 34.95 kg/m      06/25/2024   10:16 AM 06/16/2024    6:46 PM 06/16/2024    6:00 PM  BP/Weight  Systolic BP 121 151 159  Diastolic BP 74 56 74  Wt. (Lbs) 210    BMI 34.95 kg/m2      Physical Exam Constitutional:      General: She is not in acute distress.    Appearance: Normal appearance.  HENT:     Head: Normocephalic and atraumatic.  Skin:    Comments: Right lower extremity -erythema and edema noted.  Warm to the touch.  Portion of the wound is not very well-approximated as the wound has opened slightly.  Drainage noted.  Neurological:      Mental Status: She is alert.     Lab Results  Component Value Date   WBC 7.9 12/17/2023   HGB 13.8 12/17/2023   HCT 40.7 12/17/2023   PLT 255 12/17/2023   GLUCOSE 85 12/17/2023   CHOL 196 12/17/2023   TRIG 91 12/17/2023   HDL 105 12/17/2023   LDLCALC 75 12/17/2023   ALT 19 12/17/2023   AST 23 12/17/2023   NA 140 12/17/2023   K 4.8 12/17/2023   CL 103 12/17/2023   CREATININE 0.86 12/17/2023   BUN 27 12/17/2023   CO2 27 12/17/2023   TSH 2.630 12/17/2023   INR 1.08 11/23/2018     Assessment & Plan:  Laceration of lower leg with infection, right, subsequent encounter Assessment & Plan: Appears to be infected.  Antibiotics as prescribed.  A portion of the laceration is opening up and I am concerned that this wound will completely dehisce if sutures are pulled out today.  Will have her seen on Tuesday for reevaluation at which time sutures will be removed.  Advised elevation.   Other orders -     Doxycycline  Hyclate; Take 1 tablet (100 mg total) by mouth 2 (two) times daily.  Dispense: 14 tablet; Refill: 0 -     metroNIDAZOLE ; Take 1 tablet (500 mg total) by mouth 3 (three) times daily for 7 days.  Dispense: 21 tablet; Refill: 0    Follow-up: Follow-up next week  Jacqulyn Ahle DO [facility]

## 2024-06-25 NOTE — Assessment & Plan Note (Signed)
 Appears to be infected.  Antibiotics as prescribed.  A portion of the laceration is opening up and I am concerned that this wound will completely dehisce if sutures are pulled out today.  Will have her seen on Tuesday for reevaluation at which time sutures will be removed.  Advised elevation.

## 2024-06-25 NOTE — Patient Instructions (Signed)
 Antibiotics as prescribed.  See you Tuesday.

## 2024-06-30 ENCOUNTER — Encounter: Payer: Self-pay | Admitting: Family Medicine

## 2024-06-30 ENCOUNTER — Ambulatory Visit (INDEPENDENT_AMBULATORY_CARE_PROVIDER_SITE_OTHER): Admitting: Family Medicine

## 2024-06-30 VITALS — BP 138/83 | HR 81 | Ht 65.0 in | Wt 210.0 lb

## 2024-06-30 DIAGNOSIS — S81801A Unspecified open wound, right lower leg, initial encounter: Secondary | ICD-10-CM | POA: Insufficient documentation

## 2024-06-30 DIAGNOSIS — S81801D Unspecified open wound, right lower leg, subsequent encounter: Secondary | ICD-10-CM | POA: Diagnosis not present

## 2024-06-30 NOTE — Progress Notes (Signed)
 Subjective:  Patient ID: Jennifer Coleman, female    DOB: 03/16/1951  Age: 73 y.o. MRN: [id number]  CC:   Chief Complaint  Patient presents with   Wound Check    HPI:  73 year old female presents for a wound check. Wound has dehisced. Erythema has improved. Still has some swelling. Wound is draining. Compliant with antibiotic. Needs sutures removed.  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   Depression with anxiety 11/05/2017   Essential hypertension 09/12/2014    Social Hx   Social History   Socioeconomic History   Marital status: Married    Spouse name: Not on file   Number of children: Not on file   Years of education: Not on file   Highest education level: Not on file  Occupational History   Occupation: Insurance underwriter  Tobacco Use   Smoking status: Former    Current packs/day: 0.00    Types: Cigarettes    Quit date: 09/22/1992    Years since quitting: 31.7   Smokeless tobacco: Never  Vaping Use   Vaping status: Never Used  Substance and Sexual Activity   Alcohol use: No    Alcohol/week: 0.0 standard drinks of alcohol   Drug use: No   Sexual activity: Not Currently  Other Topics Concern   Not on file  Social History Narrative   Lives with husband in [place], [place]   Right handed   Social Drivers of Health   Financial Resource Strain: Low Risk  (04/17/2024)   Overall Financial Resource Strain (CARDIA)    Difficulty of Paying Living Expenses: Not hard at all  Food Insecurity: No Food Insecurity (04/17/2024)   Hunger Vital Sign    Worried About Running Out of Food in the Last Year: Never true    Ran Out of Food in the Last Year: Never true  Transportation Needs:  No Transportation Needs (04/17/2024)   PRAPARE - Administrator, Civil Service (Medical): No    Lack of Transportation (Non-Medical): No  Physical Activity: Insufficiently Active (04/17/2024)   Exercise Vital Sign    Days of Exercise per Week: 3 days    Minutes of Exercise per Session: 30 min  Stress: No Stress Concern Present (04/17/2024)   [employer] of Occupational Health - Occupational Stress Questionnaire    Feeling of Stress: Not at all  Social Connections: Socially Integrated (04/17/2024)   Social Connection and Isolation Panel    Frequency of Communication with Friends and Family: More than three times a week    Frequency of Social Gatherings with Friends and Family: More than three times a week    Attends Religious Services: More than 4 times per year    Active Member of [employer] or Organizations: Yes    Attends Engineer, structural: More than 4 times per year    Marital Status: Married    Review of Systems Per HPI  Objective:  BP 138/83   Pulse 81   Ht 5' 5 (1.651 m)   Wt 210 lb (95.3 kg)   SpO2 96%   BMI 34.95 kg/m      06/30/2024    2:13 PM 06/25/2024   10:16  AM 06/16/2024    6:46 PM  BP/Weight  Systolic BP 138 121 151  Diastolic BP 83 74 56  Wt. (Lbs) 210 210   BMI 34.95 kg/m2 34.95 kg/m2     Physical Exam Vitals and nursing note reviewed.  Constitutional:      General: She is not in acute distress.    Appearance: Normal appearance.  Skin:    Comments: Wound of the right lower extremity with dehiscence. Sutures are present. Mild swelling and erythema. See picture.  Neurological:     Mental Status: She is alert.     Lab Results  Component Value Date   WBC 7.9 12/17/2023   HGB 13.8 12/17/2023   HCT 40.7 12/17/2023   PLT 255 12/17/2023   GLUCOSE 85 12/17/2023   CHOL 196 12/17/2023   TRIG 91 12/17/2023   HDL 105 12/17/2023   LDLCALC 75 12/17/2023   ALT 19 12/17/2023   AST 23 12/17/2023   NA 140 12/17/2023   K 4.8  12/17/2023   CL 103 12/17/2023   CREATININE 0.86 12/17/2023   BUN 27 12/17/2023   CO2 27 12/17/2023   TSH 2.630 12/17/2023   INR 1.08 11/23/2018     Assessment & Plan:  Wound of right lower extremity, subsequent encounter Assessment & Plan: Sutures were removed in standard fashion today without difficulty.  Referring to wound care as wound has dehisced.  Orders: -     AMB referral to wound care center    Follow-up:  As previously scheduled.  Jacqulyn Ahle DO [facility]

## 2024-06-30 NOTE — Assessment & Plan Note (Signed)
 Sutures were removed in standard fashion today without difficulty.  Referring to wound care as wound has dehisced.

## 2024-06-30 NOTE — Patient Instructions (Signed)
 Referral placed.  Finish antibiotics. Keep clean.  Take care  Dr. Bluford

## 2024-07-01 ENCOUNTER — Other Ambulatory Visit: Payer: Self-pay

## 2024-07-01 ENCOUNTER — Emergency Department (HOSPITAL_COMMUNITY)

## 2024-07-01 ENCOUNTER — Emergency Department (HOSPITAL_COMMUNITY): Admission: EM | Admit: 2024-07-01 | Discharge: 2024-07-02 | Disposition: A | Discharge status: Home / Self Care

## 2024-07-01 ENCOUNTER — Encounter (HOSPITAL_COMMUNITY): Payer: Self-pay | Admitting: *Deleted

## 2024-07-01 DIAGNOSIS — Z5189 Encounter for other specified aftercare: Secondary | ICD-10-CM

## 2024-07-01 DIAGNOSIS — R600 Localized edema: Secondary | ICD-10-CM | POA: Insufficient documentation

## 2024-07-01 DIAGNOSIS — I10 Essential (primary) hypertension: Secondary | ICD-10-CM | POA: Insufficient documentation

## 2024-07-01 DIAGNOSIS — X58XXXA Exposure to other specified factors, initial encounter: Secondary | ICD-10-CM | POA: Diagnosis not present

## 2024-07-01 DIAGNOSIS — S81801A Unspecified open wound, right lower leg, initial encounter: Secondary | ICD-10-CM | POA: Insufficient documentation

## 2024-07-01 DIAGNOSIS — R7401 Elevation of levels of liver transaminase levels: Secondary | ICD-10-CM | POA: Insufficient documentation

## 2024-07-01 DIAGNOSIS — Z4800 Encounter for change or removal of nonsurgical wound dressing: Secondary | ICD-10-CM | POA: Insufficient documentation

## 2024-07-01 DIAGNOSIS — Z4801 Encounter for change or removal of surgical wound dressing: Secondary | ICD-10-CM | POA: Diagnosis not present

## 2024-07-01 DIAGNOSIS — M7989 Other specified soft tissue disorders: Secondary | ICD-10-CM | POA: Diagnosis not present

## 2024-07-01 LAB — COMPREHENSIVE METABOLIC PANEL WITH GFR
ALT: 21 U/L (ref 0–44)
AST: 30 U/L (ref 15–41)
Albumin: 3.3 g/dL — ABNORMAL LOW (ref 3.5–5.0)
Alkaline Phosphatase: 55 U/L (ref 38–126)
Anion gap: 10 (ref 5–15)
BUN: 27 mg/dL — ABNORMAL HIGH (ref 8–23)
CO2: 24 mmol/L (ref 22–32)
Calcium: 9.8 mg/dL (ref 8.9–10.3)
Chloride: 105 mmol/L (ref 98–111)
Creatinine, Ser: 0.97 mg/dL (ref 0.44–1.00)
GFR, Estimated: >60 mL/min (ref >60)
Glucose, Bld: 153 mg/dL — ABNORMAL HIGH (ref 70–99)
Potassium: 3.9 mmol/L (ref 3.5–5.1)
Sodium: 139 mmol/L (ref 135–145)
Total Bilirubin: 0.3 mg/dL (ref 0.0–1.2)
Total Protein: 6.1 g/dL — ABNORMAL LOW (ref 6.5–8.1)

## 2024-07-01 LAB — I-STAT CHEM 8, ED
BUN: 25 mg/dL — ABNORMAL HIGH (ref 8–23)
Calcium, Ion: 1.24 mmol/L (ref 1.15–1.40)
Chloride: 107 mmol/L (ref 98–111)
Creatinine, Ser: 0.9 mg/dL (ref 0.44–1.00)
Glucose, Bld: 151 mg/dL — ABNORMAL HIGH (ref 70–99)
HCT: 39 % (ref 36.0–46.0)
Hemoglobin: 13.3 g/dL (ref 12.0–15.0)
Potassium: 3.7 mmol/L (ref 3.5–5.1)
Sodium: 139 mmol/L (ref 135–145)
TCO2: 23 mmol/L (ref 22–32)

## 2024-07-01 LAB — URINALYSIS, W/ REFLEX TO CULTURE (INFECTION SUSPECTED)
Bacteria, UA: NONE SEEN
Bilirubin Urine: NEGATIVE
Glucose, UA: NEGATIVE mg/dL
Hgb urine dipstick: NEGATIVE
Ketones, ur: NEGATIVE mg/dL
Nitrite: NEGATIVE
Protein, ur: NEGATIVE mg/dL
Specific Gravity, Urine: 1.015 (ref 1.005–1.030)
pH: 5 (ref 5.0–8.0)

## 2024-07-01 LAB — CBC WITH DIFFERENTIAL/PLATELET
Abs Immature Granulocytes: 0.01 K/uL (ref 0.00–0.07)
Basophils Absolute: 0.1 K/uL (ref 0.0–0.1)
Basophils Relative: 1 %
Eosinophils Absolute: 0.2 K/uL (ref 0.0–0.5)
Eosinophils Relative: 3 %
HCT: 39.3 % (ref 36.0–46.0)
Hemoglobin: 13 g/dL (ref 12.0–15.0)
Immature Granulocytes: 0 %
Lymphocytes Relative: 27 %
Lymphs Abs: 1.9 K/uL (ref 0.7–4.0)
MCH: 32.3 pg (ref 26.0–34.0)
MCHC: 33.1 g/dL (ref 30.0–36.0)
MCV: 97.8 fL (ref 80.0–100.0)
Monocytes Absolute: 0.6 K/uL (ref 0.1–1.0)
Monocytes Relative: 8 %
Neutro Abs: 4.3 K/uL (ref 1.7–7.7)
Neutrophils Relative %: 61 %
Platelets: 288 K/uL (ref 150–400)
RBC: 4.02 MIL/uL (ref 3.87–5.11)
RDW: 11.9 % (ref 11.5–15.5)
WBC: 7.1 K/uL (ref 4.0–10.5)
nRBC: 0 % (ref 0.0–0.2)

## 2024-07-01 LAB — PROTIME-INR
INR: 1.1 (ref 0.8–1.2)
Prothrombin Time: 14.3 s (ref 11.4–15.2)

## 2024-07-01 LAB — I-STAT CG4 LACTIC ACID, ED: Lactic Acid, Venous: 1.2 mmol/L (ref 0.5–1.9)

## 2024-07-01 NOTE — ED Triage Notes (Signed)
 Pt had sutures removed from right lateral leg after landscaping injury and reports that wound is open and has been draining.
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
## 2024-07-02 MED ORDER — DOXYCYCLINE HYCLATE 100 MG PO CAPS: 100.0000 mg | ORAL_CAPSULE | Freq: Two times a day (BID) | ORAL | 0 refills | Status: DC

## 2024-07-02 NOTE — Discharge Instructions (Addendum)
 As we discussed, your work appears reassuring.  Please begin cleansing the wound 2-3 times a day with warm water , Dove or Dial soap.  Do not submerge the wound in water .  Please use the supplies provided to you to care for the wounds at home. Follow-up with wound care on the 26th of this month. Please read attached guide regarding wound care. You may follow-up with your PCP in the meantime.  If you develop fevers, nausea, vomiting please return to ED.

## 2024-07-03 ENCOUNTER — Telehealth: Payer: Self-pay | Admitting: Family Medicine

## 2024-07-03 NOTE — Telephone Encounter (Signed)
 Copied from CRM [id number]. Topic: Clinical - Medication Refill >> Jul 03, 2024  3:29 PM Roselie C wrote: Medication: mupirocin  ointment (BACTROBAN ) 2 %  Has the patient contacted their pharmacy? Yes (Agent: If no, request that the patient contact the pharmacy for the refill. If patient does not wish to contact the pharmacy document the reason why and proceed with request.) (Agent: If yes, when and what did the pharmacy advise?)  This is the patient's preferred pharmacy:  [facility] [phone] - [place], [place] - [address] AT [facility] [address] [place] [address] Phone: [phone] Fax: [phone]  Is this the correct pharmacy for this prescription? Yes If no, delete pharmacy and type the correct one.   Has the prescription been filled recently? Yes  Is the patient out of the medication? Yes  Has the patient been seen for an appointment in the last year OR does the patient have an upcoming appointment? Yes  Can we respond through MyChart? Yes  Agent: Please be advised that Rx refills may take up to 3 business days. We ask that you follow-up with your pharmacy.

## 2024-07-06 ENCOUNTER — Other Ambulatory Visit: Payer: Self-pay | Admitting: Family Medicine

## 2024-07-06 DIAGNOSIS — H43811 Vitreous degeneration, right eye: Secondary | ICD-10-CM | POA: Diagnosis not present

## 2024-07-06 DIAGNOSIS — H524 Presbyopia: Secondary | ICD-10-CM | POA: Diagnosis not present

## 2024-07-06 LAB — CULTURE, BLOOD (ROUTINE X 2)
Culture: NO GROWTH
Culture: NO GROWTH
Special Requests: ADEQUATE
Special Requests: ADEQUATE

## 2024-07-06 MED ORDER — MUPIROCIN 2 % EX OINT: 1.0000 | TOPICAL_OINTMENT | Freq: Three times a day (TID) | CUTANEOUS | 0 refills | Status: AC

## 2024-07-06 NOTE — Telephone Encounter (Signed)
 Copied from CRM [id number]. Topic: Clinical - Medication Question >> Jul 06, 2024 12:00 PM Jasmin G wrote: Reason for CRM: Pt called to check on the status of her mupirocin  ointment (BACTROBAN ) 2 %, I informed her that unfortunately I could not find info to relay, please refill med at your earliest convenience as she states that she needs it.

## 2024-07-07 ENCOUNTER — Encounter (HOSPITAL_BASED_OUTPATIENT_CLINIC_OR_DEPARTMENT_OTHER): Attending: General Surgery | Admitting: General Surgery

## 2024-07-07 DIAGNOSIS — S81811A Laceration without foreign body, right lower leg, initial encounter: Secondary | ICD-10-CM | POA: Diagnosis not present

## 2024-07-07 DIAGNOSIS — L97812 Non-pressure chronic ulcer of other part of right lower leg with fat layer exposed: Secondary | ICD-10-CM | POA: Insufficient documentation

## 2024-07-13 ENCOUNTER — Ambulatory Visit: Admitting: Podiatry

## 2024-07-13 ENCOUNTER — Inpatient Hospital Stay: Admitting: Family Medicine

## 2024-07-13 ENCOUNTER — Encounter (HOSPITAL_BASED_OUTPATIENT_CLINIC_OR_DEPARTMENT_OTHER): Admitting: General Surgery

## 2024-07-13 DIAGNOSIS — L97812 Non-pressure chronic ulcer of other part of right lower leg with fat layer exposed: Secondary | ICD-10-CM | POA: Diagnosis not present

## 2024-07-13 DIAGNOSIS — S81811A Laceration without foreign body, right lower leg, initial encounter: Secondary | ICD-10-CM | POA: Diagnosis not present

## 2024-07-14 ENCOUNTER — Ambulatory Visit: Admitting: Family Medicine

## 2024-07-14 ENCOUNTER — Encounter: Payer: Self-pay | Admitting: Family Medicine

## 2024-07-14 VITALS — BP 127/76 | Ht 65.0 in | Wt 209.0 lb

## 2024-07-14 DIAGNOSIS — S81801D Unspecified open wound, right lower leg, subsequent encounter: Secondary | ICD-10-CM

## 2024-07-14 NOTE — Patient Instructions (Signed)
Follow up in 6 months.  Take care  Dr. Wren Gallaga  

## 2024-07-14 NOTE — Assessment & Plan Note (Signed)
 Now seeing wound care.  Improving.

## 2024-07-14 NOTE — Progress Notes (Signed)
 Subjective:  Patient ID: Jennifer Coleman, female    DOB: 13-Oct-1951  Age: 73 y.o. MRN: [id number]  CC:   Chief Complaint  Patient presents with   Hospitalization Follow-up   Medical Management of Chronic Issues    HPI:  73 year old female presents for ED follow-up.  Patient recently seen in the ER regarding her wound.  Workup was reassuring.  She is now seeing wound care and has had significant improvement.  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   Depression with anxiety 11/05/2017   Essential hypertension 09/12/2014    Social Hx   Social History   Socioeconomic History   Marital status: Married    Spouse name: Not on file   Number of children: Not on file   Years of education: Not on file   Highest education level: Not on file  Occupational History   Occupation: Insurance underwriter  Tobacco Use   Smoking status: Former    Current packs/day: 0.00    Types: Cigarettes    Quit date: 09/22/1992    Years since quitting: 31.8   Smokeless tobacco: Never  Vaping Use   Vaping status: Never Used  Substance and Sexual Activity   Alcohol use: No    Alcohol/week: 0.0 standard drinks of alcohol   Drug use: No   Sexual activity: Not Currently  Other Topics Concern   Not on file  Social History Narrative   Lives with husband in [place], [place]   Right handed   Social Drivers of Health   Financial Resource Strain: Low Risk  (04/17/2024)   Overall Financial Resource Strain (CARDIA)    Difficulty of Paying Living Expenses: Not hard at all  Food Insecurity: No Food Insecurity (04/17/2024)   Hunger Vital Sign    Worried About Running Out of Food in the Last Year: Never true    Ran  Out of Food in the Last Year: Never true  Transportation Needs: No Transportation Needs (04/17/2024)   PRAPARE - Administrator, Civil Service (Medical): No    Lack of Transportation (Non-Medical): No  Physical Activity: Insufficiently Active (04/17/2024)   Exercise Vital Sign    Days of Exercise per Week: 3 days    Minutes of Exercise per Session: 30 min  Stress: No Stress Concern Present (04/17/2024)   [employer] of Occupational Health - Occupational Stress Questionnaire    Feeling of Stress: Not at all  Social Connections: Socially Integrated (04/17/2024)   Social Connection and Isolation Panel    Frequency of Communication with Friends and Family: More than three times a week    Frequency of Social Gatherings with Friends and Family: More than three times a week    Attends Religious Services: More than 4 times per year    Active Member of [employer] or Organizations: Yes    Attends Banker Meetings: More than 4 times per year    Marital Status: Married    Review of Systems Per HPI  Objective:  BP 127/76   Ht 5' 5 (1.651 m)   Wt 209 lb (94.8 kg)   BMI 34.78 kg/m      07/14/2024    9:59 AM 07/02/2024  1:33 AM 07/02/2024    1:30 AM  BP/Weight  Systolic BP 127 116 116  Diastolic BP 76 72 72  Wt. (Lbs) 209    BMI 34.78 kg/m2      Physical Exam Vitals and nursing note reviewed.  Constitutional:      General: She is not in acute distress.    Appearance: Normal appearance.  HENT:     Head: Normocephalic and atraumatic.  Pulmonary:     Effort: Pulmonary effort is normal. No respiratory distress.  Neurological:     Mental Status: She is alert.  Psychiatric:        Mood and Affect: Mood normal.        Behavior: Behavior normal.     Lab Results  Component Value Date   WBC 7.1 07/01/2024   HGB 13.3 07/01/2024   HCT 39.0 07/01/2024   PLT 288 07/01/2024   GLUCOSE 151 (H) 07/01/2024   CHOL 196 12/17/2023   TRIG 91 12/17/2023   HDL 105  12/17/2023   LDLCALC 75 12/17/2023   ALT 21 07/01/2024   AST 30 07/01/2024   NA 139 07/01/2024   K 3.7 07/01/2024   CL 107 07/01/2024   CREATININE 0.90 07/01/2024   BUN 25 (H) 07/01/2024   CO2 24 07/01/2024   TSH 2.630 12/17/2023   INR 1.1 07/01/2024     Assessment & Plan:  Wound of right lower extremity, subsequent encounter Assessment & Plan: Now seeing wound care.  Improving.     Follow-up:  6 months  Tyquavious Gamel Bluford DO [facility]

## 2024-07-15 ENCOUNTER — Ambulatory Visit (HOSPITAL_BASED_OUTPATIENT_CLINIC_OR_DEPARTMENT_OTHER): Admitting: General Surgery

## 2024-07-16 ENCOUNTER — Encounter (HOSPITAL_BASED_OUTPATIENT_CLINIC_OR_DEPARTMENT_OTHER): Admitting: General Surgery

## 2024-07-16 ENCOUNTER — Ambulatory Visit: Admitting: Family Medicine

## 2024-07-16 DIAGNOSIS — L97812 Non-pressure chronic ulcer of other part of right lower leg with fat layer exposed: Secondary | ICD-10-CM | POA: Diagnosis not present

## 2024-07-20 ENCOUNTER — Encounter (HOSPITAL_BASED_OUTPATIENT_CLINIC_OR_DEPARTMENT_OTHER): Admitting: General Surgery

## 2024-07-20 DIAGNOSIS — L97812 Non-pressure chronic ulcer of other part of right lower leg with fat layer exposed: Secondary | ICD-10-CM | POA: Diagnosis not present

## 2024-07-20 DIAGNOSIS — S81811A Laceration without foreign body, right lower leg, initial encounter: Secondary | ICD-10-CM | POA: Diagnosis not present

## 2024-07-24 ENCOUNTER — Encounter (HOSPITAL_BASED_OUTPATIENT_CLINIC_OR_DEPARTMENT_OTHER): Admitting: General Surgery

## 2024-07-27 ENCOUNTER — Encounter (HOSPITAL_BASED_OUTPATIENT_CLINIC_OR_DEPARTMENT_OTHER): Admitting: General Surgery

## 2024-07-27 DIAGNOSIS — L97812 Non-pressure chronic ulcer of other part of right lower leg with fat layer exposed: Secondary | ICD-10-CM | POA: Diagnosis not present

## 2024-07-27 DIAGNOSIS — S81811A Laceration without foreign body, right lower leg, initial encounter: Secondary | ICD-10-CM | POA: Diagnosis not present

## 2024-08-03 ENCOUNTER — Encounter (HOSPITAL_BASED_OUTPATIENT_CLINIC_OR_DEPARTMENT_OTHER): Attending: General Surgery | Admitting: General Surgery

## 2024-08-03 DIAGNOSIS — L97812 Non-pressure chronic ulcer of other part of right lower leg with fat layer exposed: Secondary | ICD-10-CM | POA: Diagnosis present

## 2024-08-03 DIAGNOSIS — S81811A Laceration without foreign body, right lower leg, initial encounter: Secondary | ICD-10-CM | POA: Diagnosis not present

## 2024-08-10 ENCOUNTER — Encounter (HOSPITAL_BASED_OUTPATIENT_CLINIC_OR_DEPARTMENT_OTHER): Admitting: General Surgery

## 2024-08-10 DIAGNOSIS — L97812 Non-pressure chronic ulcer of other part of right lower leg with fat layer exposed: Secondary | ICD-10-CM | POA: Diagnosis not present

## 2024-08-17 ENCOUNTER — Encounter (HOSPITAL_BASED_OUTPATIENT_CLINIC_OR_DEPARTMENT_OTHER): Admitting: General Surgery

## 2024-08-17 DIAGNOSIS — L97812 Non-pressure chronic ulcer of other part of right lower leg with fat layer exposed: Secondary | ICD-10-CM | POA: Diagnosis not present

## 2024-08-24 ENCOUNTER — Encounter (HOSPITAL_BASED_OUTPATIENT_CLINIC_OR_DEPARTMENT_OTHER): Admitting: General Surgery

## 2024-08-24 DIAGNOSIS — L97812 Non-pressure chronic ulcer of other part of right lower leg with fat layer exposed: Secondary | ICD-10-CM | POA: Diagnosis not present

## 2024-08-31 ENCOUNTER — Encounter (HOSPITAL_BASED_OUTPATIENT_CLINIC_OR_DEPARTMENT_OTHER): Attending: General Surgery | Admitting: General Surgery

## 2024-08-31 DIAGNOSIS — L97812 Non-pressure chronic ulcer of other part of right lower leg with fat layer exposed: Secondary | ICD-10-CM | POA: Diagnosis not present

## 2024-08-31 DIAGNOSIS — H40052 Ocular hypertension, left eye: Secondary | ICD-10-CM | POA: Diagnosis not present

## 2024-09-07 ENCOUNTER — Encounter (HOSPITAL_BASED_OUTPATIENT_CLINIC_OR_DEPARTMENT_OTHER): Admitting: General Surgery

## 2024-09-07 DIAGNOSIS — L97812 Non-pressure chronic ulcer of other part of right lower leg with fat layer exposed: Secondary | ICD-10-CM | POA: Diagnosis not present

## 2024-09-09 ENCOUNTER — Other Ambulatory Visit: Payer: Self-pay | Admitting: Family Medicine

## 2024-09-14 ENCOUNTER — Encounter (HOSPITAL_BASED_OUTPATIENT_CLINIC_OR_DEPARTMENT_OTHER): Admitting: General Surgery

## 2024-09-14 DIAGNOSIS — L97812 Non-pressure chronic ulcer of other part of right lower leg with fat layer exposed: Secondary | ICD-10-CM | POA: Diagnosis not present

## 2024-09-21 ENCOUNTER — Encounter (HOSPITAL_BASED_OUTPATIENT_CLINIC_OR_DEPARTMENT_OTHER): Admitting: General Surgery

## 2024-09-21 DIAGNOSIS — L97812 Non-pressure chronic ulcer of other part of right lower leg with fat layer exposed: Secondary | ICD-10-CM | POA: Diagnosis not present

## 2024-09-28 ENCOUNTER — Encounter (HOSPITAL_BASED_OUTPATIENT_CLINIC_OR_DEPARTMENT_OTHER): Attending: General Surgery | Admitting: General Surgery

## 2024-09-28 DIAGNOSIS — L97812 Non-pressure chronic ulcer of other part of right lower leg with fat layer exposed: Secondary | ICD-10-CM | POA: Diagnosis not present

## 2024-10-05 ENCOUNTER — Encounter (HOSPITAL_BASED_OUTPATIENT_CLINIC_OR_DEPARTMENT_OTHER): Admitting: Internal Medicine

## 2024-10-05 DIAGNOSIS — D692 Other nonthrombocytopenic purpura: Secondary | ICD-10-CM | POA: Diagnosis not present

## 2024-10-05 DIAGNOSIS — L814 Other melanin hyperpigmentation: Secondary | ICD-10-CM | POA: Diagnosis not present

## 2024-10-05 DIAGNOSIS — L570 Actinic keratosis: Secondary | ICD-10-CM | POA: Diagnosis not present

## 2024-10-05 DIAGNOSIS — L820 Inflamed seborrheic keratosis: Secondary | ICD-10-CM | POA: Diagnosis not present

## 2024-10-05 DIAGNOSIS — Z85828 Personal history of other malignant neoplasm of skin: Secondary | ICD-10-CM | POA: Diagnosis not present

## 2024-10-05 DIAGNOSIS — D2372 Other benign neoplasm of skin of left lower limb, including hip: Secondary | ICD-10-CM | POA: Diagnosis not present

## 2024-10-05 DIAGNOSIS — D1801 Hemangioma of skin and subcutaneous tissue: Secondary | ICD-10-CM | POA: Diagnosis not present

## 2024-10-05 DIAGNOSIS — L821 Other seborrheic keratosis: Secondary | ICD-10-CM | POA: Diagnosis not present

## 2024-10-12 ENCOUNTER — Encounter (HOSPITAL_BASED_OUTPATIENT_CLINIC_OR_DEPARTMENT_OTHER): Admitting: Internal Medicine

## 2024-10-12 ENCOUNTER — Other Ambulatory Visit: Payer: Self-pay

## 2024-10-12 DIAGNOSIS — M810 Age-related osteoporosis without current pathological fracture: Secondary | ICD-10-CM

## 2024-10-12 MED ORDER — PROLIA 60 MG/ML ~~LOC~~ SOSY
60.0000 mg | PREFILLED_SYRINGE | SUBCUTANEOUS | 0 refills | Status: DC
  Filled 2024-10-12: qty 1, 180d supply, fill #0

## 2024-10-27 ENCOUNTER — Telehealth: Payer: Self-pay

## 2024-10-27 ENCOUNTER — Other Ambulatory Visit: Payer: Self-pay

## 2024-10-27 ENCOUNTER — Other Ambulatory Visit: Payer: Self-pay | Admitting: "Endocrinology

## 2024-10-27 MED ORDER — DENOSUMAB-BBDZ 60 MG/ML ~~LOC~~ SOSY
60.0000 mg | PREFILLED_SYRINGE | SUBCUTANEOUS | 0 refills | Status: AC
  Filled 2024-10-28 (×3): qty 1, 180d supply, fill #0

## 2024-10-27 NOTE — Telephone Encounter (Signed)
 [facility] calling and stated Prolia  is not covered under patient's insurance and Jubbonti will need to be sent in as the alternative. Please advise,

## 2024-10-28 ENCOUNTER — Other Ambulatory Visit (HOSPITAL_COMMUNITY): Payer: Self-pay

## 2024-10-28 ENCOUNTER — Other Ambulatory Visit: Payer: Self-pay

## 2024-10-28 NOTE — Progress Notes (Signed)
 Specialty Pharmacy Refill Coordination Note  Jennifer Coleman is a 73 y.o. female contacted today regarding refills of specialty medication(s) Denosumab -bbdz BARTON)   Patient requested Courier to Provider Office   Delivery date: 11/02/24   Verified address: [facility]   Medication will be filled on: 10/30/24  Copay: $250, patient aware and provided updated cc info  Appointment: 1.7.26

## 2024-10-30 ENCOUNTER — Other Ambulatory Visit: Payer: Self-pay

## 2024-11-04 ENCOUNTER — Encounter: Payer: Self-pay | Admitting: "Endocrinology

## 2024-11-04 ENCOUNTER — Ambulatory Visit: Admitting: "Endocrinology

## 2024-11-04 VITALS — BP 126/74 | HR 60 | Ht 65.0 in | Wt 203.0 lb

## 2024-11-04 DIAGNOSIS — E875 Hyperkalemia: Secondary | ICD-10-CM

## 2024-11-04 DIAGNOSIS — M810 Age-related osteoporosis without current pathological fracture: Secondary | ICD-10-CM

## 2024-11-04 MED ORDER — DENOSUMAB-BBDZ 60 MG/ML ~~LOC~~ SOSY
60.0000 mg | PREFILLED_SYRINGE | Freq: Once | SUBCUTANEOUS | Status: AC
  Administered 2024-11-04: 60 mg via SUBCUTANEOUS

## 2024-11-04 NOTE — Progress Notes (Signed)
 Pt seen for follow up with Dr. Lenis and to received Jubbonti  injection. Pt provided Jubbonti  60mg  (lot # [id number], Exp. Date 08/28/2026). Discussed with pt possible side effects and answered all pt questions. Jubbonti  60mg  injected SQ in lower L abdomen without difficulty. Pt waited the required 15 minutes post injection, no adverse reactions noted.

## 2024-11-04 NOTE — Progress Notes (Signed)
 "                                                              11/04/2024       Endocrinology follow-up note   Past Medical History:  Diagnosis Date   Anxiety    Arthritis    Basal cell carcinoma 02/02/2016   Left auricle - MOHs   Dysrhythmia    PVCs   Family history of anesthesia complication 25 yrs ago   Sister allergic to sccinocholine - unable to come off ventilator   Hyperlipidemia    Hypertension    Lumbar herniated disc    Osteoporosis    Pneumonia    PONV (postoperative nausea and vomiting)    Premature ventricular contractions    SCC (squamous cell carcinoma) 05/09/2021   in situ- left anterior neck   SCC (squamous cell carcinoma) 05/09/2021   in situ- left upper arm-posterior (EXC)   SCC (squamous cell carcinoma) 05/09/2021   in situ- right inner lower leg   Scoliosis    Shingles    Squamous cell carcinoma of skin 11/14/2011   right cheek - CX3 + 5FU   Squamous cell carcinoma of skin 11/14/2011   right canthus, superior - CX3 + 5FU   Squamous cell carcinoma of skin 11/14/2011   left cheek - CX3 + 5FU   Squamous cell carcinoma of skin 07/26/2015   right forearm - CX3 + 5FU   Squamous cell carcinoma of skin 02/02/2016   left tip shoulder - CX3 + cautery + 5FU    Squamous cell carcinoma of skin 07/17/2016   left post shoulder - tx after biopsy   Squamous cell carcinoma of skin 07/17/2016   right low shin - tx after biopsy   Squamous cell carcinoma of skin 03/13/2019   left post shoulder - tx after biopsy   Squamous cell carcinoma of skin 03/13/2019   right inner ankle - tx after biopsy   Squamous cell carcinoma of skin 09/23/2019   left cheek - tx after biopsy + MOHs (Dr. Lloyd)   Squamous cell carcinoma of skin 09/23/2019   right upper shin, medial - CX3 + 5FU   Squamous cell carcinoma of skin 09/23/2019   right upper shin, lateral - CX3 + 5FU   Varicose veins    Varicose veins of left lower extremity    Past Surgical History:  Procedure Laterality  Date   CARPAL TUNNEL RELEASE     CERVICAL DISC SURGERY  1992   Fusion   CHOLECYSTECTOMY N/A 11/21/2018   Procedure: LAPAROSCOPIC CHOLECYSTECTOMY;  Surgeon: Kallie Manuelita BROCKS, MD;  Location: [facility];  Service: General;  Laterality: N/A;   COLONOSCOPY N/A 06/07/2016   Procedure: COLONOSCOPY;  Surgeon: Claudis RAYMOND Rivet, MD;  Location: [facility];  Service: Endoscopy;  Laterality: N/A;  1:00   KNEE ARTHROSCOPY  09/16/2012   Procedure: ARTHROSCOPY KNEE;  Surgeon: Maude LELON Right, MD;  Location: [facility];  Service: Orthopedics;  Laterality: Right;  Right Knee Arthroscopy   LAPAROSCOPIC GASTRIC SLEEVE RESECTION N/A 08/17/2019   Procedure: LAPAROSCOPIC GASTRIC SLEEVE RESECTION AND HIATAL HERNIA REPAIR, UPPPER ENDO AND ERAS PATHWAY;  Surgeon: Gladis Cough, MD;  Location: [facility];  Service: General;  Laterality: N/A;   SALPINGOOPHORECTOMY Left 1985   SHOULDER ARTHROSCOPY  WITH OPEN ROTATOR CUFF REPAIR AND DISTAL CLAVICLE ACROMINECTOMY Right 05/03/2016   Procedure: RIGHT SHOULDER ARTHROSCOPY WITH MINI-OPEN ROTATOR CUFF REPAIR,DISTAL CLAVICLE RESECTION, SUBACROMIAL DECOMPRESSION.;  Surgeon: Maude LELON Right, MD;  Location:  [facility];  Service: Orthopedics;  Laterality: Right;   SHOULDER ARTHROSCOPY WITH ROTATOR CUFF REPAIR Right 09/25/2016   Procedure: Right Shoulder Manipulation, Arthroscopic Debridement of Joint, Removal of Loose Body and Re-Repair of Rotator Cuff;  Surgeon: Maude LELON Right, MD;  Location: [facility];  Service: Orthopedics;  Laterality: Right;   Social History   Socioeconomic History   Marital status: Married    Spouse name: Not on file   Number of children: Not on file   Years of education: Not on file   Highest education level: Not on file  Occupational History   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  Tobacco Use   Smoking status: Former    Current packs/day: 0.00    Types: Cigarettes    Quit date: 09/22/1992    Years since quitting: 32.1   Smokeless tobacco: Never  Vaping Use    Vaping status: Never Used  Substance and Sexual Activity   Alcohol use: No    Alcohol/week: 0.0 standard drinks of alcohol   Drug use: No   Sexual activity: Not Currently  Other Topics Concern   Not on file  Social History Narrative   Lives with husband in [place], [place]   Right handed   Social Drivers of Health   Tobacco Use: Medium Risk (07/14/2024)   Patient History    Smoking Tobacco Use: Former    Smokeless Tobacco Use: Never    Passive Exposure: Not on Actuary Strain: Low Risk (04/17/2024)   Overall Financial Resource Strain (CARDIA)    Difficulty of Paying Living Expenses: Not hard at all  Food Insecurity: No Food Insecurity (04/17/2024)   Epic    Worried About Radiation Protection Practitioner of Food in the Last Year: Never true    Ran Out of Food in the Last Year: Never true  Transportation Needs: No Transportation Needs (04/17/2024)   Epic    Lack of Transportation (Medical): No    Lack of Transportation (Non-Medical): No  Physical Activity: Insufficiently Active (04/17/2024)   Exercise Vital Sign    Days of Exercise per Week: 3 days    Minutes of Exercise per Session: 30 min  Stress: No Stress Concern Present (04/17/2024)   [employer] of Occupational Health - Occupational Stress Questionnaire    Feeling of Stress: Not at all  Social Connections: Socially Integrated (04/17/2024)   Social Connection and Isolation Panel    Frequency of Communication with Friends and Family: More than three times a week    Frequency of Social Gatherings with Friends and Family: More than three times a week    Attends Religious Services: More than 4 times per year    Active Member of Clubs or Organizations: Yes    Attends Banker Meetings: More than 4 times per year    Marital Status: Married  Depression (PHQ2-9): Low Risk (06/25/2024)   Depression (PHQ2-9)    PHQ-2 Score: 0  Alcohol Screen: Low Risk (04/17/2024)   Alcohol Screen    Last Alcohol Screening Score (AUDIT): 0   Housing: Unknown (04/17/2024)   Epic    Unable to Pay for Housing in the Last Year: No    Number of Times Moved in the Last Year: Not on file    Homeless in the Last Year: No  Utilities:  Not At Risk (04/17/2024)   Epic    Threatened with loss of utilities: No  Health Literacy: Adequate Health Literacy (04/17/2024)   B1300 Health Literacy    Frequency of need for help with medical instructions: Never   Outpatient Encounter Medications as of 11/04/2024  Medication Sig   Biotin 2500 MCG CHEW Chew 1 tablet by mouth daily.   cetirizine  (ZYRTEC ) 10 MG tablet Take 1 tablet (10 mg total) by mouth daily.   denosumab -bbdz (JUBBONTI ) 60 MG/ML SOSY injection Inject 60 mg into the skin every 6 (six) months.   diclofenac  (VOLTAREN ) 75 MG EC tablet TAKE 1 TABLET(75 MG) BY MOUTH TWICE DAILY AS NEEDED FOR MODERATE PAIN   enalapril  (VASOTEC ) 20 MG tablet Take 1 tablet (20 mg total) by mouth 2 (two) times daily.   escitalopram  (LEXAPRO ) 20 MG tablet Take 1 tablet (20 mg total) by mouth daily.   fluticasone  (FLONASE ) 50 MCG/ACT nasal spray Place 2 sprays into both nostrils daily.   MAGNESIUM CITRATE PO Take 1 tablet by mouth daily.   Multiple Vitamins-Minerals (ONE-A-DAY WOMENS 50 PLUS PO) Take 1 tablet by mouth daily.   PROAIR  HFA 108 (90 Base) MCG/ACT inhaler INHALE TWO PUFFS BY MOUTH EVERY 4 TO 6 HOURS AS NEEDED FOR FOR WHEEZING   rosuvastatin  (CRESTOR ) 10 MG tablet TAKE 1 TABLET BY MOUTH ONCE DAILY.   [EXPIRED] denosumab -bbdz (JUBBONTI ) injection 60 mg    No facility-administered encounter medications on file as of 11/04/2024.   ALLERGIES: Allergies  Allergen Reactions   Penicillins Shortness Of Breath, Swelling, Rash and Other (See Comments)    Did it involve swelling of the face/tongue/throat, SOB, or low BP? Yes Did it involve sudden or severe rash/hives, skin peeling, or any reaction on the inside of your mouth or nose? Yes Did you need to seek medical attention at a hospital or doctor's office?  Yes-DR. office When did it last happen?  Over 10 years If all above answers are NO, may proceed with cephalosporin use. Patient tolerates Cephalosporins    Shellfish Allergy Shortness Of Breath and Swelling   Gabapentin  Other (See Comments)    Didn't like the way the medication made her feel per pt.    Levaquin  [Levofloxacin ] Other (See Comments)    Made her feel confused    Lyrica [Pregabalin]     Pt states, the medication made the shingles outbreak, more intense. Pt was taking for shingles pain.   Codeine Itching   Morphine  And Codeine Nausea And Vomiting    VACCINATION STATUS: Immunization History  Administered Date(s) Administered   Fluad Quad(high Dose 65+) 11/01/2022   Influenza,inj,Quad PF,6+ Mos 07/30/2019   Influenza-Unspecified 08/05/2020, 08/25/2021   PFIZER(Purple Top)SARS-COV-2 Vaccination 12/11/2019, 01/08/2020, 10/27/2020   Pneumococcal Conjugate-13 04/27/2021   Pneumococcal Polysaccharide-23 08/18/2019   Tdap 03/01/2016, 06/16/2024   Zoster Recombinant(Shingrix) 08/25/2021, 02/15/2022     HPI   Jennifer Coleman is 74 y.o. female who presents today with a medical history as above. she is being seen in follow-up after she was seen in consultation for osteoporosis requested by Cook, Jayce G, DO.  Patient was diagnosed with osteoporosis  approximately  5 years ago.  She was initiated on Fosamax  70 mg weekly which she has tolerated and taken for at least 4 years.   After adequate treatment response she was switched to Prolia  after her last visit.  She has tolerated her first treatment in returning for her next treatment today. She denies any recent fragility fractures, however reports remote past  bilateral wrist fractures.    No dizziness/vertigo/orthostasis. Review of her home density studies showing worsening of osteoporosis with her most recent DEXA from March 2025 showing T-score of -3.1, on femoral neck.     No h/o vitamin D  deficiency.  She notes that she has  always had slightly above target serum calcium .  She was never diagnosed with hyperparathyroidism nor treatment for hypercalcemia. She has medical history of obesity for which she underwent sleeve gastrectomy in 2021 which helped her achieve 50 pounds of weight loss.  Her other medical problems include hypertension, hyperlipidemia on treatment.  Her current BMI is 33.78 kg/m.    She is not on any calcium  supplements other than her multivitamin daily. She is active, walks 3 to 4 miles daily, as well as some resistance training. She reports up to an inch of height loss over the years.  She does not take high vitamin A doses. No h/o kidney stones.  No history of thyrotoxicosis.  Her most recent thyroid  function tests were consistent with euthyroid state.   No history of CKD, nor liver disease.  Menopause was at 74 y/o.   Pt does have a FH of osteoporosis in her mother.  Review of Systems  Constitutional: + Minimally fluctuating body weight, no fatigue, no subjective hyperthermia, no subjective hypothermia, + irregular sleep pattern Eyes: no blurry vision, no xerophthalmia   Objective:    BP 126/74   Pulse 60   Ht 5' 5 (1.651 m)   Wt 203 lb (92.1 kg)   BMI 33.78 kg/m   Wt Readings from Last 3 Encounters:  11/04/24 203 lb (92.1 kg)  07/14/24 209 lb (94.8 kg)  07/01/24 210 lb 1.6 oz (95.3 kg)    Physical Exam  Constitutional: + BMI of 36.11 , not in acute distress, normal state of mind Eyes: PERRLA, EOMI, no exophthalmos ENT: moist mucous membranes, no thyromegaly, no cervical lymphadenopathy   CMP ( most recent) CMP     Component Value Date/Time   NA 144 11/02/2024 1202   K 5.5 (H) 11/02/2024 1202   CL 107 (H) 11/02/2024 1202   CO2 23 11/02/2024 1202   GLUCOSE 78 11/02/2024 1202   GLUCOSE 151 (H) 07/01/2024 2150   BUN 29 (H) 11/02/2024 1202   CREATININE 0.94 11/02/2024 1202   CALCIUM  10.3 11/02/2024 1202   PROT 6.6 11/02/2024 1202   ALBUMIN 4.4 11/02/2024 1202    AST 28 11/02/2024 1202   ALT 23 11/02/2024 1202   ALKPHOS 66 11/02/2024 1202   BILITOT 0.4 11/02/2024 1202   EGFR 65 11/02/2024 1202   GFRNONAA >60 07/01/2024 2146      Lipid Panel ( most recent) Lipid Panel     Component Value Date/Time   CHOL 196 12/17/2023 1406   TRIG 91 12/17/2023 1406   HDL 105 12/17/2023 1406   CHOLHDL 1.9 12/17/2023 1406   LDLCALC 75 12/17/2023 1406   LABVLDL 16 12/17/2023 1406      Lab Results  Component Value Date   TSH 2.630 12/17/2023   TSH 2.630 11/08/2017   TSH 3.500 12/30/2015   TSH 2.410 03/04/2015   FREET4 1.27 12/17/2023    DualFemur Neck Left 01/20/2024 70.4 Osteoporosis -3.1 0.602 g/cm2 -5.2% - DualFemur Neck Left 11/25/2019 66.3 Osteoporosis -2.9 0.635 g/cm2 -14.7% Yes DualFemur Neck Left 07/26/2016 62.9 Osteopenia -2.1 0.744 g/cm2 -7.8% Yes DualFemur Neck Left 05/24/2004 50.8 Osteopenia -1.7 0.807 g/cm2 - -   DualFemur Total Mean 01/20/2024 70.4 Osteopenia -1.8 0.782 g/cm2 -5.1% Yes  DualFemur Total Mean 11/25/2019 66.3 Osteopenia -1.5 0.824 g/cm2 -9.4% Yes DualFemur Total Mean 07/26/2016 62.9 Normal -0.8 0.909 g/cm2 -6.5% Yes DualFemur Total Mean 05/24/2004 50.8 Normal -0.3 0.972 g/cm2 - - ASSESSMENT: The BMD measured at Femur Neck Left is 0.602 g/cm2 with a T-score of -3.1. This patient is considered osteoporotic according to World Health Organization [facility]) criteria. The scan quality is good. Compared with the prior study on 11/25/19, the BMD of the total mean shows a statistically significant decrease. Lumbar spine was excluded due to advanced degenerative changes. Bilateral forearms were excluded due to wrist fractures.    Assessment: 1. Osteoporosis 2.  Hypercalcemia 3.  Hypomagnesemia 4.  Hyperkalemia  Plan: 1. Osteoporosis - likely postmenopausal versus hypercalcemia - I reviewed her existing for bone density studies with her showing worsening osteoporosis despite treatment with Fosamax  for at least 4 years.   Discussed about increased risk of fracture, depending on the T score, greatly increased when the T score is lower than -2.5. Her most recent bone density from March 2025 showed T-score of -3.1 left femoral neck.   - We discussed about the different medication classes, benefits and side effects.  - Due to inadequate response to bisphosphonates, she was switched to Prolia  which she has tolerated very well.    She will be given her next dose today in the form of Jubbonti  60 mg subcutaneously and every 6 months.   She was taken off of Fosamax  during her last visit.  I explained the mechanism of action and expected benefits.  - we reviewed her dietary and supplemental calcium  and vitamin D  intake, which I believe are adequate.  She presents with normocalcemia, mild hyperkalemia, however elevated PTH, magnesium, and slightly lower phosphorus.   -She is advised to cut back on her consumption of bananas, oranges.  She is not on potassium supplement.  She is also advised to cut back on magnesium supplements. Mild hyperkalemia seems to be dietary related. -She is encouraged to maintain adequate protein and continue to stay active. - Her next bone density is not due until March 2027, however she will return in 6 months with the following labs including CMP, PTH, calcium , magnesium, phosphorus, vitamin D . She does not control bladder completely, would not be considered for 24-hour urine calcium  measurement for now.  - If she continues to have hypercalcemia, she will be considered for Reclast infusion in view of Jubbonti .     - I advised patient to maintain close follow up with Cook, Jayce G, DO for primary care needs.  I spent  25  minutes in the care of the patient today including review of labs from Thyroid  Function, CMP, and other relevant labs ; imaging/biopsy records (current and previous including abstractions from other facilities); face-to-face time discussing  her lab results and symptoms,  medications doses, her options of short and long term treatment based on the latest standards of care / guidelines;   and documenting the encounter.  Andree LELON Poli  participated in the discussions, expressed understanding, and voiced agreement with the above plans.  All questions were answered to her satisfaction. she is encouraged to contact clinic should she have any questions or concerns prior to her return visit. Dear Patient: Feel free to review your progress notes.  If you are reviewing this progress note and have questions about the meaning of /or medical terms being used, please make a note and address it at your next follow-up appointment.  Medical notes are meant to be  a communication tool between medical professionals and require medical terms to be used for efficiency and insurance approval.   Follow up plan: Return in about 6 months (around 05/04/2025) for Prolia (Jubbonti ) Today (ASAP) and P(J) NV, F/U with Pre-visit Labs.   Ranny Earl, MD [facility] [address] [place], [place] [address] Phone: [phone]  Fax: [phone]     11/04/2024, 12:20 PM  This note was partially dictated with voice recognition software. Similar sounding words can be transcribed inadequately or may not  be corrected upon review. "

## 2024-11-08 LAB — COMPREHENSIVE METABOLIC PANEL WITH GFR
ALT: 23 IU/L (ref 0–32)
AST: 28 IU/L (ref 0–40)
Albumin: 4.4 g/dL (ref 3.8–4.8)
Alkaline Phosphatase: 66 IU/L (ref 49–135)
BUN/Creatinine Ratio: 31 — ABNORMAL HIGH (ref 12–28)
BUN: 29 mg/dL — ABNORMAL HIGH (ref 8–27)
Bilirubin Total: 0.4 mg/dL (ref 0.0–1.2)
CO2: 23 mmol/L (ref 20–29)
Calcium: 10.3 mg/dL (ref 8.7–10.3)
Chloride: 107 mmol/L — ABNORMAL HIGH (ref 96–106)
Creatinine, Ser: 0.94 mg/dL (ref 0.57–1.00)
Globulin, Total: 2.2 g/dL (ref 1.5–4.5)
Glucose: 78 mg/dL (ref 70–99)
Potassium: 5.5 mmol/L — ABNORMAL HIGH (ref 3.5–5.2)
Sodium: 144 mmol/L (ref 134–144)
Total Protein: 6.6 g/dL (ref 6.0–8.5)
eGFR: 65 mL/min/1.73

## 2024-11-08 LAB — PTH, INTACT AND CALCIUM: PTH: 114 pg/mL — ABNORMAL HIGH (ref 15–65)

## 2024-11-08 LAB — PHOSPHORUS: Phosphorus: 2.7 mg/dL — ABNORMAL LOW (ref 3.0–4.3)

## 2024-11-08 LAB — VITAMIN D 25 HYDROXY (VIT D DEFICIENCY, FRACTURES): Vit D, 25-Hydroxy: 46.2 ng/mL (ref 30.0–100.0)

## 2024-11-08 LAB — PTH-RELATED PEPTIDE: PTH-related peptide: <2 pmol/L

## 2024-11-08 LAB — MAGNESIUM: Magnesium: 2.8 mg/dL — ABNORMAL HIGH (ref 1.6–2.3)

## 2024-11-18 ENCOUNTER — Other Ambulatory Visit (HOSPITAL_BASED_OUTPATIENT_CLINIC_OR_DEPARTMENT_OTHER): Payer: Self-pay

## 2024-12-01 ENCOUNTER — Other Ambulatory Visit (HOSPITAL_BASED_OUTPATIENT_CLINIC_OR_DEPARTMENT_OTHER): Payer: Self-pay

## 2024-12-02 ENCOUNTER — Other Ambulatory Visit (HOSPITAL_BASED_OUTPATIENT_CLINIC_OR_DEPARTMENT_OTHER): Payer: Self-pay

## 2024-12-02 MED FILL — Rosuvastatin Calcium Tab 10 MG: ORAL | 90 days supply | Qty: 90 | Fill #0 | Status: AC

## 2024-12-03 ENCOUNTER — Other Ambulatory Visit (HOSPITAL_BASED_OUTPATIENT_CLINIC_OR_DEPARTMENT_OTHER): Payer: Self-pay

## 2025-01-11 ENCOUNTER — Ambulatory Visit: Admitting: Family Medicine

## 2025-04-23 ENCOUNTER — Ambulatory Visit

## 2025-05-05 ENCOUNTER — Ambulatory Visit: Admitting: "Endocrinology
